# Patient Record
Sex: Female | Born: 1950
Health system: Southern US, Community
[De-identification: ages and names within clinical notes are randomized; demographics above are authoritative.]

## PROBLEM LIST (undated history)

## (undated) DIAGNOSIS — Z972 Presence of dental prosthetic device (complete) (partial): Secondary | ICD-10-CM

## (undated) DIAGNOSIS — Z9289 Personal history of other medical treatment: Secondary | ICD-10-CM

## (undated) DIAGNOSIS — R609 Edema, unspecified: Secondary | ICD-10-CM

## (undated) DIAGNOSIS — N189 Chronic kidney disease, unspecified: Secondary | ICD-10-CM

## (undated) DIAGNOSIS — Z923 Personal history of irradiation: Secondary | ICD-10-CM

## (undated) DIAGNOSIS — I10 Essential (primary) hypertension: Secondary | ICD-10-CM

## (undated) DIAGNOSIS — C3490 Malignant neoplasm of unspecified part of unspecified bronchus or lung: Secondary | ICD-10-CM

## (undated) DIAGNOSIS — K759 Inflammatory liver disease, unspecified: Secondary | ICD-10-CM

## (undated) HISTORY — DX: Personal history of irradiation: Z92.3

## (undated) HISTORY — PX: MYOMECTOMY: SHX85

## (undated) HISTORY — PX: MULTIPLE TOOTH EXTRACTIONS: SHX2053

---

## 2000-02-25 ENCOUNTER — Other Ambulatory Visit: Admission: RE | Admit: 2000-02-25 | Discharge: 2000-02-25 | Payer: Self-pay | Admitting: Family Medicine

## 2004-12-15 ENCOUNTER — Inpatient Hospital Stay (HOSPITAL_COMMUNITY): Admission: EM | Admit: 2004-12-15 | Discharge: 2004-12-17 | Payer: Self-pay | Admitting: Emergency Medicine

## 2004-12-16 ENCOUNTER — Encounter (INDEPENDENT_AMBULATORY_CARE_PROVIDER_SITE_OTHER): Payer: Self-pay | Admitting: Cardiology

## 2008-04-26 ENCOUNTER — Other Ambulatory Visit: Admission: RE | Admit: 2008-04-26 | Discharge: 2008-04-26 | Payer: Self-pay | Admitting: Family Medicine

## 2008-12-26 ENCOUNTER — Encounter: Payer: Self-pay | Admitting: Family Medicine

## 2008-12-26 ENCOUNTER — Ambulatory Visit: Payer: Self-pay | Admitting: Family Medicine

## 2008-12-26 DIAGNOSIS — I10 Essential (primary) hypertension: Secondary | ICD-10-CM | POA: Insufficient documentation

## 2008-12-26 DIAGNOSIS — F1010 Alcohol abuse, uncomplicated: Secondary | ICD-10-CM | POA: Insufficient documentation

## 2008-12-26 DIAGNOSIS — H9319 Tinnitus, unspecified ear: Secondary | ICD-10-CM | POA: Insufficient documentation

## 2008-12-26 DIAGNOSIS — F329 Major depressive disorder, single episode, unspecified: Secondary | ICD-10-CM

## 2008-12-26 DIAGNOSIS — F3289 Other specified depressive episodes: Secondary | ICD-10-CM | POA: Insufficient documentation

## 2008-12-26 DIAGNOSIS — F1210 Cannabis abuse, uncomplicated: Secondary | ICD-10-CM | POA: Insufficient documentation

## 2008-12-26 LAB — CONVERTED CEMR LAB
ALT: 17 units/L (ref 0–35)
AST: 39 units/L — ABNORMAL HIGH (ref 0–37)
Albumin: 4.2 g/dL (ref 3.5–5.2)
Alkaline Phosphatase: 63 units/L (ref 39–117)
BUN: 23 mg/dL (ref 6–23)
CO2: 21 meq/L (ref 19–32)
Calcium: 9.4 mg/dL (ref 8.4–10.5)
Chloride: 103 meq/L (ref 96–112)
Cholesterol: 254 mg/dL — ABNORMAL HIGH (ref 0–200)
Creatinine, Ser: 1.18 mg/dL (ref 0.40–1.20)
Glucose, Bld: 101 mg/dL — ABNORMAL HIGH (ref 70–99)
HDL: 58 mg/dL (ref >39)
LDL Cholesterol: 174 mg/dL — ABNORMAL HIGH (ref 0–99)
Potassium: 3.7 meq/L (ref 3.5–5.3)
Sodium: 141 meq/L (ref 135–145)
Total Bilirubin: 0.5 mg/dL (ref 0.3–1.2)
Total CHOL/HDL Ratio: 4.4
Total Protein: 7.4 g/dL (ref 6.0–8.3)
Triglycerides: 110 mg/dL (ref <150)
VLDL: 22 mg/dL (ref 0–40)

## 2009-04-25 ENCOUNTER — Encounter: Payer: Self-pay | Admitting: Family Medicine

## 2010-01-14 ENCOUNTER — Emergency Department (HOSPITAL_COMMUNITY)
Admission: EM | Admit: 2010-01-14 | Discharge: 2010-01-14 | Payer: Self-pay | Source: Home / Self Care | Admitting: Emergency Medicine

## 2010-03-06 NOTE — Miscellaneous (Signed)
  Clinical Lists Changes  Medications: Removed medication of IMIPRAMINE HCL 10 MG TABS (IMIPRAMINE HCL) one to two tabs by mouth at bedtime as needed for difficulty sleeping

## 2010-04-14 LAB — URINALYSIS, ROUTINE W REFLEX MICROSCOPIC
Bilirubin Urine: NEGATIVE
Glucose, UA: NEGATIVE mg/dL
Hgb urine dipstick: NEGATIVE
Ketones, ur: 40 mg/dL — AB
Nitrite: NEGATIVE
Protein, ur: NEGATIVE mg/dL
Specific Gravity, Urine: 1.013 (ref 1.005–1.030)
Urobilinogen, UA: 0.2 mg/dL (ref 0.0–1.0)
pH: 5 (ref 5.0–8.0)

## 2010-04-14 LAB — CBC
HCT: 33.1 % — ABNORMAL LOW (ref 36.0–46.0)
Hemoglobin: 10.7 g/dL — ABNORMAL LOW (ref 12.0–15.0)
MCH: 29.2 pg (ref 26.0–34.0)
MCHC: 32.3 g/dL (ref 30.0–36.0)
MCV: 90.4 fL (ref 78.0–100.0)
Platelets: 196 10*3/uL (ref 150–400)
RBC: 3.66 MIL/uL — ABNORMAL LOW (ref 3.87–5.11)
RDW: 13.8 % (ref 11.5–15.5)
WBC: 9.8 10*3/uL (ref 4.0–10.5)

## 2010-04-14 LAB — POCT I-STAT, CHEM 8
BUN: 34 mg/dL — ABNORMAL HIGH (ref 6–23)
Calcium, Ion: 1.09 mmol/L — ABNORMAL LOW (ref 1.12–1.32)
Chloride: 113 mEq/L — ABNORMAL HIGH (ref 96–112)
Creatinine, Ser: 1.3 mg/dL — ABNORMAL HIGH (ref 0.4–1.2)
Glucose, Bld: 82 mg/dL (ref 70–99)
HCT: 34 % — ABNORMAL LOW (ref 36.0–46.0)
Hemoglobin: 11.6 g/dL — ABNORMAL LOW (ref 12.0–15.0)
Potassium: 3.5 mEq/L (ref 3.5–5.1)
Sodium: 144 mEq/L (ref 135–145)
TCO2: 14 mmol/L (ref 0–100)

## 2010-04-14 LAB — POCT CARDIAC MARKERS
CKMB, poc: <1 ng/mL — ABNORMAL LOW (ref 1.0–8.0)
Myoglobin, poc: 75.9 ng/mL (ref 12–200)
Troponin i, poc: <0.05 ng/mL (ref 0.00–0.09)

## 2010-04-14 LAB — D-DIMER, QUANTITATIVE: D-Dimer, Quant: 1.08 ug/mL-FEU — ABNORMAL HIGH (ref 0.00–0.48)

## 2010-04-14 LAB — RAPID URINE DRUG SCREEN, HOSP PERFORMED
Amphetamines: NOT DETECTED
Barbiturates: NOT DETECTED
Benzodiazepines: NOT DETECTED
Cocaine: NOT DETECTED
Opiates: NOT DETECTED
Tetrahydrocannabinol: NOT DETECTED

## 2010-04-14 LAB — ETHANOL: Alcohol, Ethyl (B): <5 mg/dL (ref 0–10)

## 2010-06-20 NOTE — Consult Note (Signed)
Kimberly Frost, Kimberly Frost               ACCOUNT NO.:  [id number]   MEDICAL RECORD NO.:  [id number]          PATIENT TYPE:  INP   LOCATION:  2016                         FACILITY:  [facility]   PHYSICIAN:  Fransico Him, M.D.     DATE OF BIRTH:  Apr 04, 1950   DATE OF CONSULTATION:  12/15/2004  DATE OF DISCHARGE:                                   CONSULTATION   REASON FOR CONSULTATION:  Chest pain.   HISTORY OF PRESENT ILLNESS:  This is a very pleasant 60 year old black  female with a history of hypertension. She was admitted to the emergency  room after a prolonged episode of chest pain, lasting more than 3 hours.  Apparently she awakened from sleep last night with mid sternal chest pain  and pressure, radiating to the right shoulder. She felt weak, nauseated, and  dizzy but did not vomit. She did not have any diaphoresis. She said that she  also felt like she could not get her breath. She lives in [place] and  was very frustrated. Apparently, her fiancee drove her to [facility] and at that time, the pain was a 7 out of 10 upon arrival. She did  receive sublingual nitroglycerin and Tylenol with eventual relief of pain  but not immediately. Initial blood pressure on examination was 171.96 mmHg.  Apparently, the patient ran out of her Toprol about 2 weeks ago. She denied  any tachypalpitations. She denies any exertional symptoms. She did have 1  episode similar to this a month ago, again occurring while she was lying in  bed. She thought that it was an anxiety attack. Again, she has never had any  exertional symptoms of shortness of breath or chest pain.   FAMILY HISTORY:  Mother has hypertension. She has a family history of adult  onset diabetes mellitus. There is no family history of coronary disease.   SOCIAL HISTORY:  She is a former user of tobacco but quit 20 years ago. She  drinks 2 to 3 beers and gin every other day but not daily. She works at [employer]  in [employer]. She lifts  heavy boxes.   PAST MEDICAL HISTORY:  Includes hypertension, anxiety, and panic attacks.  Medical non-compliance due to financial concerns and borderline alcohol  abuse.   ALLERGIES:  NONE.   MEDICATIONS:  The patient is on Toprol XL but has not been taking it. She  ran out.   REVIEW OF SYSTEMS:  Other than as stated in the HPI, is negative.   PHYSICAL EXAMINATION:  VITAL SIGNS:  Blood pressure 141/83, pulse 81,  respiratory rate 20. She is afebrile.  GENERAL:  A well developed, well nourished, black female in no acute  distress.  HEENT:  Benign.  NECK:  Supple without lymphadenopathy. Carotid upstrokes +2 bilaterally. No  bruits.  LUNGS:  Clear to auscultation throughout.  HEART:  Regular rate and rhythm. No murmur, rub, or gallop. Normal S1 and  S2.  ABDOMEN:  Soft, nontender, and nondistended. Normal active bowel sounds. No  hepatosplenomegaly.  EXTREMITIES:  No edema.   LABORATORY DATA:  Sodium  139, potassium 3, chloride 104, bicarb 27, BUN 12,  creatinine 0.9, glucose 95. Myoglobin was 65.5. CPK MB 1.1, troponin less  than 0.05.   Baseline EKG shows sinus rhythm with non-specific T wave abnormality.   Chest x-ray is negative. No active disease.   IMPRESSION:  1.  Prolonged episode of chest pain. Very atypical in that it only occurs at      rest. She has had 2 episode in the past month, both of which occurred      while she was in bed at night. She has had no exertional symptoms and      symptoms are most consistent with gastroesophageal reflux, although she      does have cardiac risk factors including her age and hypertension as      well as a remote history of tobacco use. First set of cardiac markers      are negative and EKG is essentially non-ischemic with non-specific T      wave abnormality.  2.  Hypokalemia.  3.  Hypertension, poorly controlled.  4.  Medical non-compliance.   PLAN:  1.  Agree with continuing to rule out myocardial infarction with serial       enzymes. Again, initial enzymes are negative but would continue to rule      out with serial enzymes.  2.  If cardiac enzymes are negative x3, would perform stress Cardiolite      study in the morning.  3.  Agree with Lovenox subcutaneously.  4.  Agree with spiral CT to rule out pulmonary embolism.  5.  Check fasting lipid panel.  6.  Agree with resuming beta blockers for blood pressure control.  7.  Replete potassium and recheck in the morning.  8.  Would recommend starting PPI, given history of alcohol use and symptoms      more consistent with gastroesophageal reflux.  9.  Check a 2-D echocardiogram to rule out pericardial effusion.      Fransico Him, M.D.  Electronically Signed     TT/MEDQ  D:  12/15/2004  T:  12/16/2004  Job:  [id number]   cc:   L. Donnie Coffin, M.D.  Fax: [phone]

## 2010-06-20 NOTE — H&P (Signed)
NAMECALVINA, WEDDINGTON NO.:  [id number]   MEDICAL RECORD NO.:  [id number]          PATIENT TYPE:  EMS   LOCATION:  [facility]JO                         FACILITY:  [facility]   PHYSICIAN:  Jerelene Redden, MD      DATE OF BIRTH:  1950/07/09   DATE OF ADMISSION:  12/15/2004  DATE OF DISCHARGE:                                HISTORY & PHYSICAL   Kimberly Frost is a 60 year old lady who states that she is a patient of  Dr. Donnie Frost. She indicates that she has been treated in the past for  hypertension and was taking Toprol-XL 100 mg daily until about 2 weeks ago,  when she stopped the medicine because she said that she had run out. At  about 2 a.m. this morning, she awakened from sleep with a feeling of  smothering and in her chest associated with a pressure/pain. It did not seem  to radiate to the arm or neck. The pain seemed to be made worse by  breathing. It did not seem to radiate through to the back. She also noted  that her heart seemed to be beating both hard and fast. She had no  diaphoresis or nausea. She got up and tried to walk around but this did not  seem to relieve the pain. Eventually, she was transported to the [facility]  emergency room where on arrival her blood pressure was 171/96, O2 saturation  was 99%. An electrocardiogram was obtained which showed nonspecific ST and T  wave changes. A chest x-ray was within normal limits. Point of care markers  have been negative. She received 2 sublingual nitroglycerin which gave her a  headache and which she feels caused the pain to gradually resolve, although  there was no dramatic response. Kimberly Frost indicates that she has had  similar episodes, most recently about a month ago, although these episodes  were not as severe. Apparently, she has not been taking her Toprol-XL  consistently because she says that when she has one of these episodes, she  will usually take a Toprol-XL tablet and that the problem will seem to  get  better. This time, she did not have any Toprol-XL to take. She does not  recall any history of exertional chest pain. There is no past history of a  heart problem that she is aware of, and she has not had a stress test in the  past. Also of possible significance is that Kimberly Frost states that she has  been drinking an excessive amount of alcohol recently. Some days she will  drink three or four beers, but often she will drink a half a pin of gin each  night. She does not have any history of shakes, blackout episodes, or  delirium tremens.   PAST MEDICAL HISTORY:  Medications:  None. Allergies:  None. Medical  illnesses:  Hypertension, see above. Operations:  She had a uterine  myomectomy in 1994.   FAMILY HISTORY:  Notable for diabetes and hypertension in her mother. Her  father had a history of bone cancer. She has a brother but  does not really  know anything about his health history.   SOCIAL HISTORY:  She works for [employer] at [employer]. She says that her  work is stressful. She has not smoked for about 20 years. Her alcohol  history is described above. She does not abuse drugs or take any other  nonprescription medications.   REVIEW OF SYSTEMS:  HEAD:  She denies headache or dizziness. EYES:  She  denies visual blurring or diplopia. EAR, NOSE, AND THROAT:  Denies earache,  sinus pain, or sore throat. CHEST:  Denies coughing or wheezing; otherwise,  see above. CARDIOVASCULAR:  See above. GI:  She denies any history of  indigestion, heart burn, hematemesis, nausea, or vomiting. GENITOURINARY:  Denies dysuria or urinary frequency. NEUROLOGIC:  No history of seizure or  stroke. ENDO:  Denies excessive thirst, urinary frequency, or nocturia.   PHYSICAL EXAMINATION:  VITAL SIGNS:  Blood pressure presently is 138/84,  heart rate is 90, respirations 20, O2 saturation 100%.  HEENT:  Within normal limits.  CHEST:  Clear.  CARDIOVASCULAR:  Reveals a mild tachycardia. There are no  rubs, murmurs, or  gallops. The heart rhythm is regular.  ABDOMEN:  Benign, normal bowel sounds. No masses or tenderness, no guarding  or rebound.  NEUROLOGIC AND EXTREMITIES:  Normal.   Laboratory studies obtained include sodium of 141, potassium 3.0, glucose  96, creatinine 0.8. initial point of care markers negative. Chest x-ray  normal.   IMPRESSION:  1.  Chest pain, rule out myocardial infarction.  2.  Associated tachycardia and dyspnea; consider pulmonary embolus,      gastroesophageal reflux, panic attack.  3.  History of hypertension, noncompliant.  4.  Probable alcohol abuse.  5.  Hypokalemia.   The plan will be to admit the patient to telemetry to rule out MI. Will  obtain a spiral CT scan of the chest to rule out PE. Will resume a beta  blocker as well as subcu Lovenox and follow standard protocol. I am going to  give the patient thiamine as well as Ativan as a precaution against alcohol  withdrawal.           ______________________________  Jerelene Redden, MD     SY/MEDQ  D:  12/15/2004  T:  12/15/2004  Job:  [id number]   cc:   Kimberly Frost, M.D.  Fax: [phone]

## 2011-01-29 ENCOUNTER — Ambulatory Visit: Payer: Self-pay | Admitting: Family Medicine

## 2011-02-04 ENCOUNTER — Ambulatory Visit: Payer: Self-pay | Admitting: Family Medicine

## 2011-10-26 ENCOUNTER — Ambulatory Visit (INDEPENDENT_AMBULATORY_CARE_PROVIDER_SITE_OTHER): Payer: Self-pay | Admitting: Family Medicine

## 2011-10-26 ENCOUNTER — Encounter: Payer: Self-pay | Admitting: Family Medicine

## 2011-10-26 VITALS — BP 168/97 | HR 90 | Temp 99.0°F | Ht 67.0 in | Wt 118.0 lb

## 2011-10-26 DIAGNOSIS — I10 Essential (primary) hypertension: Secondary | ICD-10-CM

## 2011-10-26 MED ORDER — HYDROCHLOROTHIAZIDE 25 MG PO TABS: 25.0000 mg | ORAL_TABLET | Freq: Every day | ORAL | Status: DC

## 2011-10-26 NOTE — Patient Instructions (Addendum)
It was great to meet you today, Kimberly Frost. I sent HCTZ to your pharmacy.  Take once a day. Try to avoid high sodium foods and increase physical activity. Apply for [facility] card, then schedule follow up appointment with me. If you develop worsening symptoms, chest pain, shortness of breath, or one sided weakness, please call MD or go to ER.  Hypertension As your heart beats, it forces blood through your arteries. This force is your blood pressure. If the pressure is too high, it is called hypertension (HTN) or high blood pressure. HTN is dangerous because you may have it and not know it. High blood pressure may mean that your heart has to work harder to pump blood. Your arteries may be narrow or stiff. The extra work puts you at risk for heart disease, stroke, and other problems.  Blood pressure consists of two numbers, a higher number over a lower, 110/72, for example. It is stated as "110 over 72." The ideal is below 120 for the top number (systolic) and under 80 for the bottom (diastolic). Write down your blood pressure today. You should pay close attention to your blood pressure if you have certain conditions such as:  Heart failure.   Prior heart attack.   Diabetes   Chronic kidney disease.   Prior stroke.   Multiple risk factors for heart disease.  To see if you have HTN, your blood pressure should be measured while you are seated with your arm held at the level of the heart. It should be measured at least twice. A one-time elevated blood pressure reading (especially in the Emergency Department) does not mean that you need treatment. There may be conditions in which the blood pressure is different between your right and left arms. It is important to see your caregiver soon for a recheck. Most people have essential hypertension which means that there is not a specific cause. This type of high blood pressure may be lowered by changing lifestyle factors such as:  Stress.   Smoking.    Lack of exercise.   Excessive weight.   Drug/tobacco/alcohol use.   Eating less salt.  Most people do not have symptoms from high blood pressure until it has caused damage to the body. Effective treatment can often prevent, delay or reduce that damage. TREATMENT  When a cause has been identified, treatment for high blood pressure is directed at the cause. There are a large number of medications to treat HTN. These fall into several categories, and your caregiver will help you select the medicines that are best for you. Medications may have side effects. You should review side effects with your caregiver. If your blood pressure stays high after you have made lifestyle changes or started on medicines,   Your medication(s) may need to be changed.   Other problems may need to be addressed.   Be certain you understand your prescriptions, and know how and when to take your medicine.   Be sure to follow up with your caregiver within the time frame advised (usually within two weeks) to have your blood pressure rechecked and to review your medications.   If you are taking more than one medicine to lower your blood pressure, make sure you know how and at what times they should be taken. Taking two medicines at the same time can result in blood pressure that is too low.  SEEK IMMEDIATE MEDICAL CARE IF:  You develop a severe headache, blurred or changing vision, or confusion.  You have unusual weakness or numbness, or a faint feeling.   You have severe chest or abdominal pain, vomiting, or breathing problems.  MAKE SURE YOU:   Understand these instructions.   Will watch your condition.   Will get help right away if you are not doing well or get worse.  Document Released: 01/19/2005 Document Revised: 01/08/2011 Document Reviewed: 09/09/2007 [facility] Patient Information 2012 [place].  DASH Diet The DASH diet stands for "Dietary Approaches to Stop Hypertension." It is a healthy  eating plan that has been shown to reduce high blood pressure (hypertension) in as little as 14 days, while also possibly providing other significant health benefits. These other health benefits include reducing the risk of breast cancer after menopause and reducing the risk of type 2 diabetes, heart disease, colon cancer, and stroke. Health benefits also include weight loss and slowing kidney failure in patients with chronic kidney disease.  DIET GUIDELINES  Limit salt (sodium). Your diet should contain less than 1500 mg of sodium daily.   Limit refined or processed carbohydrates. Your diet should include mostly whole grains. Desserts and added sugars should be used sparingly.   Include small amounts of heart-healthy fats. These types of fats include nuts, oils, and tub margarine. Limit saturated and trans fats. These fats have been shown to be harmful in the body.  CHOOSING FOODS  The following food groups are based on a 2000 calorie diet. See your Registered Dietitian for individual calorie needs. Grains and Grain Products (6 to 8 servings daily)  Eat More Often: Whole-wheat bread, brown rice, whole-grain or wheat pasta, quinoa, popcorn without added fat or salt (air popped).   Eat Less Often: White bread, white pasta, white rice, cornbread.  Vegetables (4 to 5 servings daily)  Eat More Often: Fresh, frozen, and canned vegetables. Vegetables may be raw, steamed, roasted, or grilled with a minimal amount of fat.   Eat Less Often/Avoid: Creamed or fried vegetables. Vegetables in a cheese sauce.  Fruit (4 to 5 servings daily)  Eat More Often: All fresh, canned (in natural juice), or frozen fruits. Dried fruits without added sugar. One hundred percent fruit juice ( cup [237 mL] daily).   Eat Less Often: Dried fruits with added sugar. Canned fruit in light or heavy syrup.  [employer], Fish, and Poultry (2 servings or less daily. One serving is 3 to 4 oz [85-114 g]).  Eat More Often: Ninety  percent or leaner ground beef, tenderloin, sirloin. Round cuts of beef, chicken breast, [place] breast. All fish. Grill, bake, or broil your meat. Nothing should be fried.   Eat Less Often/Avoid: Fatty cuts of meat, [place], or chicken leg, thigh, or wing. Fried cuts of meat or fish.  Dairy (2 to 3 servings)  Eat More Often: Low-fat or fat-free milk, low-fat plain or light yogurt, reduced-fat or part-skim cheese.   Eat Less Often/Avoid: Milk (whole, 2%, skim, or chocolate).Whole milk yogurt. Full-fat cheeses.  Nuts, Seeds, and Legumes (4 to 5 servings per week)  Eat More Often: All without added salt.   Eat Less Often/Avoid: Salted nuts and seeds, canned beans with added salt.  Fats and Sweets (limited)  Eat More Often: Vegetable oils, tub margarines without trans fats, sugar-free gelatin. Mayonnaise and salad dressings.   Eat Less Often/Avoid: Coconut oils, palm oils, butter, stick margarine, cream, half and half, cookies, candy, pie.  FOR MORE INFORMATION The Dash Diet Eating Plan: www.dashdiet.org Document Released: 01/08/2011 Document Reviewed: 12/29/2010 [facility] Patient Information 2012 [place]  [place].  

## 2011-10-26 NOTE — Progress Notes (Signed)
  Subjective:    Patient here for evaluation of elevated blood pressure.  Patient used to come to our clinic in 2011, but stopped when she lost insurance.  She went to ER for chest pain and she was found to have elevated BP.  She was started on Metoprolol 50 BID.  Patient took this for one year and felt better.  Patient ran out of it and has been off of medications for about 9 months.   She is not exercising and is not adherent to a low-salt diet.  Endorses symptoms: dyspnea and parasthesias in upper extremities.  Patient also complains of intermittent headaches, blurry vision, but denies any unilateral weakness.  Patient denies: chest pain, fatigue, near-syncope and syncope. Cardiovascular risk factors: was told she had pre-diabetes, but has not been screened in over 2 years.    Review of Systems Pertinent items are noted in HPI.     Objective:    BP 168/97  Pulse 90  Temp 99 F (37.2 C) (Oral)  Ht 5\' 7"  (1.702 m)  Wt 118 lb (53.524 kg)  BMI 18.48 kg/m2 General appearance: alert, cooperative and no distress Eyes: conjunctivae/corneas clear. PERRL, EOM's intact. Fundi benign. Neck: no adenopathy, no JVD and supple, symmetrical, trachea midline Lungs: clear to auscultation bilaterally Heart: regular rate and rhythm, S1, S2 normal, no murmur, click, rub or gallop Extremities: extremities normal, atraumatic, no cyanosis or edema    Assessment:    Hypertension, Uncontrolled.    Plan:    See Problem List

## 2011-10-26 NOTE — Assessment & Plan Note (Signed)
Will restart HCTZ 25 mg daily.  Will likely need to add combination therapy. Red flags discussed.  See AVS.   Counseled on DASH diet. Patient to apply for [facility] card and then schedule follow up appointment with me. Return to clinic in 1 month or sooner as needed.

## 2012-12-22 ENCOUNTER — Emergency Department (HOSPITAL_COMMUNITY)
Admission: EM | Admit: 2012-12-22 | Discharge: 2012-12-22 | Disposition: A | Discharge status: Home / Self Care | Payer: Self-pay | Attending: Emergency Medicine | Admitting: Emergency Medicine

## 2012-12-22 ENCOUNTER — Emergency Department (HOSPITAL_COMMUNITY): Payer: Self-pay

## 2012-12-22 ENCOUNTER — Encounter (HOSPITAL_COMMUNITY): Payer: Self-pay | Admitting: Emergency Medicine

## 2012-12-22 DIAGNOSIS — I10 Essential (primary) hypertension: Secondary | ICD-10-CM | POA: Insufficient documentation

## 2012-12-22 DIAGNOSIS — R42 Dizziness and giddiness: Secondary | ICD-10-CM | POA: Insufficient documentation

## 2012-12-22 DIAGNOSIS — Z87891 Personal history of nicotine dependence: Secondary | ICD-10-CM | POA: Insufficient documentation

## 2012-12-22 DIAGNOSIS — Z79899 Other long term (current) drug therapy: Secondary | ICD-10-CM | POA: Insufficient documentation

## 2012-12-22 DIAGNOSIS — R002 Palpitations: Secondary | ICD-10-CM | POA: Insufficient documentation

## 2012-12-22 DIAGNOSIS — R000 Tachycardia, unspecified: Secondary | ICD-10-CM | POA: Insufficient documentation

## 2012-12-22 DIAGNOSIS — R0602 Shortness of breath: Secondary | ICD-10-CM | POA: Insufficient documentation

## 2012-12-22 DIAGNOSIS — Z8719 Personal history of other diseases of the digestive system: Secondary | ICD-10-CM | POA: Insufficient documentation

## 2012-12-22 DIAGNOSIS — E876 Hypokalemia: Secondary | ICD-10-CM | POA: Insufficient documentation

## 2012-12-22 HISTORY — DX: Essential (primary) hypertension: I10

## 2012-12-22 HISTORY — DX: Inflammatory liver disease, unspecified: K75.9

## 2012-12-22 LAB — BASIC METABOLIC PANEL
CO2: 18 mEq/L — ABNORMAL LOW (ref 19–32)
Calcium: 9.3 mg/dL (ref 8.4–10.5)
Chloride: 105 mEq/L (ref 96–112)
Creatinine, Ser: 1.75 mg/dL — ABNORMAL HIGH (ref 0.50–1.10)
GFR calc Af Amer: 35 mL/min — ABNORMAL LOW (ref >90)
Glucose, Bld: 91 mg/dL (ref 70–99)
Potassium: 3.2 mEq/L — ABNORMAL LOW (ref 3.5–5.1)
Sodium: 139 mEq/L (ref 135–145)

## 2012-12-22 LAB — CBC WITH DIFFERENTIAL/PLATELET
Basophils Absolute: 0 10*3/uL (ref 0.0–0.1)
Basophils Relative: 0 % (ref 0–1)
Eosinophils Absolute: 0 10*3/uL (ref 0.0–0.7)
HCT: 34.4 % — ABNORMAL LOW (ref 36.0–46.0)
Hemoglobin: 12 g/dL (ref 12.0–15.0)
Lymphocytes Relative: 30 % (ref 12–46)
MCH: 31 pg (ref 26.0–34.0)
Monocytes Absolute: 0.6 10*3/uL (ref 0.1–1.0)
Monocytes Relative: 11 % (ref 3–12)
Neutro Abs: 3.1 10*3/uL (ref 1.7–7.7)
Neutrophils Relative %: 58 % (ref 43–77)
RBC: 3.87 MIL/uL (ref 3.87–5.11)
RDW: 13.2 % (ref 11.5–15.5)
WBC: 5.3 10*3/uL (ref 4.0–10.5)

## 2012-12-22 LAB — CBC
Hemoglobin: 12.3 g/dL (ref 12.0–15.0)
MCH: 30.4 pg (ref 26.0–34.0)
MCHC: 34 g/dL (ref 30.0–36.0)
MCV: 89.6 fL (ref 78.0–100.0)
Platelets: 211 10*3/uL (ref 150–400)
RBC: 4.04 MIL/uL (ref 3.87–5.11)
WBC: 5.1 10*3/uL (ref 4.0–10.5)

## 2012-12-22 LAB — POCT I-STAT TROPONIN I

## 2012-12-22 LAB — PRO B NATRIURETIC PEPTIDE: Pro B Natriuretic peptide (BNP): 340.3 pg/mL — ABNORMAL HIGH (ref 0–125)

## 2012-12-22 MED ORDER — HYDROCHLOROTHIAZIDE 25 MG PO TABS: 25.0000 mg | ORAL_TABLET | Freq: Every day | ORAL | Status: DC

## 2012-12-22 MED ORDER — POTASSIUM CHLORIDE CRYS ER 20 MEQ PO TBCR
40.0000 meq | EXTENDED_RELEASE_TABLET | Freq: Once | ORAL | Status: AC
  Administered 2012-12-22: 40 meq via ORAL
  Filled 2012-12-22: qty 2

## 2012-12-22 MED ORDER — HYDROCHLOROTHIAZIDE 25 MG PO TABS
25.0000 mg | ORAL_TABLET | Freq: Every day | ORAL | Status: DC
  Administered 2012-12-22: 25 mg via ORAL
  Filled 2012-12-22: qty 1

## 2012-12-22 MED ORDER — METOPROLOL SUCCINATE ER 50 MG PO TB24: 100.0000 mg | ORAL_TABLET | Freq: Every day | ORAL | Status: DC

## 2012-12-22 MED ORDER — METOPROLOL SUCCINATE ER 50 MG PO TB24
50.0000 mg | ORAL_TABLET | Freq: Every day | ORAL | Status: DC
  Administered 2012-12-22: 50 mg via ORAL
  Filled 2012-12-22: qty 1

## 2012-12-22 NOTE — ED Provider Notes (Signed)
Medical screening examination/treatment/procedure(s) were performed by non-physician practitioner and as supervising physician I was immediately available for consultation/collaboration.  EKG Interpretation    Date/Time:  Thursday December 22 2012 11:24:54 EST Ventricular Rate:  102 PR Interval:  138 QRS Duration: 74 QT Interval:  320 QTC Calculation: 417 R Axis:   13 Text Interpretation:  Sinus tachycardia Right atrial enlargement Nonspecific ST and T wave abnormality Abnormal ECG No significant change since last tracing Confirmed by Sakura Denis  MD, Chun Sellen ([id number]) on 12/22/2012 11:31:18 AM              Ephraim Hamburger, MD 12/22/12 1515

## 2012-12-22 NOTE — ED Provider Notes (Signed)
CSN: [id number]     Arrival date & time 12/22/12  1119 History   First MD Initiated Contact with Patient 12/22/12 1128     Chief Complaint  Patient presents with  . Palpitations  . Shortness of Breath  . Dizziness   (Consider location/radiation/quality/duration/timing/severity/associated sxs/prior Treatment) HPI Comments: Patient is a 62 year old female with a past medical history of hypertension and hepatitis who presents with a 4 day history of palpitations, SOB, and dizziness that she describes as a lightheadedness. Symptoms started gradually when she was at work and have remained constant since the onset. Patient reports being out of her HCTZ and Metoprolol medication for the past 2 months. She tried to see her doctor but they were not able to see her. Patient thinks her symptoms are due to her blood pressure. No aggravating/alleviating factors. No other associated symptoms such as chest pain.    Past Medical History  Diagnosis Date  . Hypertension   . Hepatitis    History reviewed. No pertinent past surgical history. History reviewed. No pertinent family history. History  Substance Use Topics  . Smoking status: Former Research scientist (life sciences)  . Smokeless tobacco: Not on file  . Alcohol Use: Yes   OB History   Grav Para Term Preterm Abortions TAB SAB Ect Mult Living                 Review of Systems  Respiratory: Positive for shortness of breath.   Cardiovascular: Positive for palpitations.  Neurological: Positive for light-headedness.  All other systems reviewed and are negative.    Allergies  Review of patient's allergies indicates no known allergies.  Home Medications   Current Outpatient Rx  Name  Route  Sig  Dispense  Refill  . hydrochlorothiazide (HYDRODIURIL) 25 MG tablet   Oral   Take 1 tablet (25 mg total) by mouth daily.   30 tablet   1   . metoprolol (TOPROL-XL) 100 MG 24 hr tablet   Oral   Take 100 mg by mouth daily.            BP 189/108  Pulse 106   Temp(Src) 99.1 F (37.3 C) (Oral)  Resp 18  SpO2 100% Physical Exam  Nursing note and vitals reviewed. Constitutional: She is oriented to person, place, and time. She appears well-developed and well-nourished. No distress.  HENT:  Head: Normocephalic and atraumatic.  Eyes: Conjunctivae and EOM are normal.  Neck: Normal range of motion.  Cardiovascular: Regular rhythm.  Exam reveals no gallop and no friction rub.   No murmur heard. tachycardic  Pulmonary/Chest: Effort normal and breath sounds normal. She has no wheezes. She has no rales. She exhibits no tenderness.  Abdominal: Soft. She exhibits no distension. There is no tenderness. There is no rebound and no guarding.  Musculoskeletal: Normal range of motion.  Neurological: She is alert and oriented to person, place, and time. Coordination normal.  Speech is goal-oriented. Moves limbs without ataxia.   Skin: Skin is warm and dry.  Psychiatric: She has a normal mood and affect. Her behavior is normal.    ED Course  Procedures (including critical care time) Labs Review Labs Reviewed  BASIC METABOLIC PANEL - Abnormal; Notable for the following:    Potassium 3.2 (*)    CO2 18 (*)    BUN 27 (*)    Creatinine, Ser 1.75 (*)    GFR calc non Af Amer 30 (*)    GFR calc Af Amer 35 (*)  All other components within normal limits  PRO B NATRIURETIC PEPTIDE - Abnormal; Notable for the following:    Pro B Natriuretic peptide (BNP) 340.3 (*)    All other components within normal limits  CBC WITH DIFFERENTIAL - Abnormal; Notable for the following:    HCT 34.4 (*)    All other components within normal limits  CBC  POCT I-STAT TROPONIN I   Imaging Review Dg Chest 2 View  12/22/2012   CLINICAL DATA:  Palpitations, dizzy, shortness of breath  EXAM: CHEST  2 VIEW  COMPARISON:  None.  FINDINGS: The heart size and vascular pattern are normal. There is no infiltrate or effusion. A 3 mm calcified nodular opacity projects over the right upper  lobe laterally, not of acute clinical significance.  IMPRESSION: No active cardiopulmonary disease.   Electronically Signed   By: Skipper Cliche M.D.   On: 12/22/2012 12:36    EKG Interpretation    Date/Time:  Thursday December 22 2012 11:24:54 EST Ventricular Rate:  102 PR Interval:  138 QRS Duration: 74 QT Interval:  320 QTC Calculation: 417 R Axis:   13 Text Interpretation:  Sinus tachycardia Right atrial enlargement Nonspecific ST and T wave abnormality Abnormal ECG No significant change since last tracing Confirmed by GOLDSTON  MD, SCOTT ([id number]) on 12/22/2012 11:31:18 AM            MDM   1. Palpitations   2. Lightheaded   3. Hypokalemia     11:38 AM Labs and chest xray pending. EKG shows no significant changes from previous. Patient tachycardic and hypertensive at this time.   2:50 PM Labs show mild hypokalemia and no other acute changes. Troponin is negative and BNP only slightly elevated and not consistent with CHF based on chest xray. Patient was given her home meds here. I will discharged her with a refill of her home medications. Patient reports improvement of her symptoms. Patient discussed with Dr. Regenia Skeeter who is agreeable to plan.    Alvina Chou, PA-C 12/22/12 1502

## 2012-12-22 NOTE — ED Notes (Addendum)
Pt reports feeling lightheaded and dizziness when she went to work on Monday, having sob and palpitations. HR 110 at triage. ekg done. Airway intact. Reports being out of bp meds.

## 2013-01-26 ENCOUNTER — Emergency Department (HOSPITAL_COMMUNITY): Payer: Self-pay

## 2013-01-26 ENCOUNTER — Observation Stay (HOSPITAL_COMMUNITY)
Admission: EM | Admit: 2013-01-26 | Discharge: 2013-01-27 | Disposition: A | Discharge status: Home / Self Care | Payer: Self-pay | Attending: Family Medicine | Admitting: Family Medicine

## 2013-01-26 ENCOUNTER — Encounter (HOSPITAL_COMMUNITY): Payer: Self-pay | Admitting: Emergency Medicine

## 2013-01-26 ENCOUNTER — Inpatient Hospital Stay (HOSPITAL_COMMUNITY): Payer: Self-pay

## 2013-01-26 DIAGNOSIS — E860 Dehydration: Secondary | ICD-10-CM

## 2013-01-26 DIAGNOSIS — R000 Tachycardia, unspecified: Secondary | ICD-10-CM | POA: Insufficient documentation

## 2013-01-26 DIAGNOSIS — R791 Abnormal coagulation profile: Secondary | ICD-10-CM

## 2013-01-26 DIAGNOSIS — F1010 Alcohol abuse, uncomplicated: Secondary | ICD-10-CM | POA: Diagnosis present

## 2013-01-26 DIAGNOSIS — E872 Acidosis, unspecified: Principal | ICD-10-CM | POA: Insufficient documentation

## 2013-01-26 DIAGNOSIS — R112 Nausea with vomiting, unspecified: Secondary | ICD-10-CM | POA: Insufficient documentation

## 2013-01-26 DIAGNOSIS — R197 Diarrhea, unspecified: Secondary | ICD-10-CM | POA: Insufficient documentation

## 2013-01-26 DIAGNOSIS — Z87891 Personal history of nicotine dependence: Secondary | ICD-10-CM | POA: Insufficient documentation

## 2013-01-26 DIAGNOSIS — M7989 Other specified soft tissue disorders: Secondary | ICD-10-CM

## 2013-01-26 DIAGNOSIS — Z23 Encounter for immunization: Secondary | ICD-10-CM | POA: Insufficient documentation

## 2013-01-26 DIAGNOSIS — I10 Essential (primary) hypertension: Secondary | ICD-10-CM | POA: Insufficient documentation

## 2013-01-26 DIAGNOSIS — F1210 Cannabis abuse, uncomplicated: Secondary | ICD-10-CM | POA: Diagnosis present

## 2013-01-26 DIAGNOSIS — E876 Hypokalemia: Secondary | ICD-10-CM | POA: Insufficient documentation

## 2013-01-26 DIAGNOSIS — R002 Palpitations: Secondary | ICD-10-CM

## 2013-01-26 DIAGNOSIS — N179 Acute kidney failure, unspecified: Secondary | ICD-10-CM | POA: Insufficient documentation

## 2013-01-26 DIAGNOSIS — F329 Major depressive disorder, single episode, unspecified: Secondary | ICD-10-CM | POA: Insufficient documentation

## 2013-01-26 DIAGNOSIS — F3289 Other specified depressive episodes: Secondary | ICD-10-CM | POA: Insufficient documentation

## 2013-01-26 DIAGNOSIS — R7989 Other specified abnormal findings of blood chemistry: Secondary | ICD-10-CM

## 2013-01-26 LAB — CBC WITH DIFFERENTIAL/PLATELET
Basophils Relative: 0 % (ref 0–1)
HCT: 34.3 % — ABNORMAL LOW (ref 36.0–46.0)
Hemoglobin: 11.6 g/dL — ABNORMAL LOW (ref 12.0–15.0)
Lymphs Abs: 3 10*3/uL (ref 0.7–4.0)
MCH: 29.7 pg (ref 26.0–34.0)
MCHC: 33.8 g/dL (ref 30.0–36.0)
Monocytes Absolute: 0.7 10*3/uL (ref 0.1–1.0)
Monocytes Relative: 6 % (ref 3–12)
Neutro Abs: 8.4 10*3/uL — ABNORMAL HIGH (ref 1.7–7.7)
RBC: 3.91 MIL/uL (ref 3.87–5.11)
WBC: 12.2 10*3/uL — ABNORMAL HIGH (ref 4.0–10.5)

## 2013-01-26 LAB — BASIC METABOLIC PANEL
BUN: 47 mg/dL — ABNORMAL HIGH (ref 6–23)
BUN: 46 mg/dL — ABNORMAL HIGH (ref 6–23)
CO2: 17 mEq/L — ABNORMAL LOW (ref 19–32)
Calcium: 9.5 mg/dL (ref 8.4–10.5)
Calcium: 8.7 mg/dL (ref 8.4–10.5)
Chloride: 101 mEq/L (ref 96–112)
Chloride: 107 mEq/L (ref 96–112)
Creatinine, Ser: 1.92 mg/dL — ABNORMAL HIGH (ref 0.50–1.10)
Creatinine, Ser: 1.74 mg/dL — ABNORMAL HIGH (ref 0.50–1.10)
GFR calc Af Amer: 31 mL/min — ABNORMAL LOW (ref >90)
GFR calc Af Amer: 35 mL/min — ABNORMAL LOW (ref >90)
GFR calc Af Amer: 35 mL/min — ABNORMAL LOW (ref >90)
GFR calc non Af Amer: 30 mL/min — ABNORMAL LOW (ref >90)
Glucose, Bld: 93 mg/dL (ref 70–99)
Glucose, Bld: 95 mg/dL (ref 70–99)
Potassium: 4.5 mEq/L (ref 3.5–5.1)
Potassium: 4 mEq/L (ref 3.5–5.1)
Sodium: 138 mEq/L (ref 135–145)

## 2013-01-26 LAB — SALICYLATE LEVEL: Salicylate Lvl: <2 mg/dL — ABNORMAL LOW (ref 2.8–20.0)

## 2013-01-26 LAB — POCT I-STAT 3, ART BLOOD GAS (G3+)
Acid-base deficit: 13 mmol/L — ABNORMAL HIGH (ref 0.0–2.0)
Bicarbonate: 12.9 mEq/L — ABNORMAL LOW (ref 20.0–24.0)
O2 Saturation: 94 %
TCO2: 14 mmol/L (ref 0–100)

## 2013-01-26 LAB — HEPATIC FUNCTION PANEL
ALT: 37 U/L — ABNORMAL HIGH (ref 0–35)
Alkaline Phosphatase: 60 U/L (ref 39–117)
Bilirubin, Direct: 0.1 mg/dL (ref 0.0–0.3)
Indirect Bilirubin: 0.3 mg/dL (ref 0.3–0.9)

## 2013-01-26 LAB — RAPID URINE DRUG SCREEN, HOSP PERFORMED
Amphetamines: NOT DETECTED
Barbiturates: NOT DETECTED
Cocaine: NOT DETECTED
Tetrahydrocannabinol: NOT DETECTED

## 2013-01-26 LAB — BLOOD GAS, ARTERIAL
Bicarbonate: 15.7 mEq/L — ABNORMAL LOW (ref 20.0–24.0)
Drawn by: 31101
FIO2: 0.21 %
pCO2 arterial: 29.1 mmHg — ABNORMAL LOW (ref 35.0–45.0)
pH, Arterial: 7.352 (ref 7.350–7.450)
pO2, Arterial: 87.6 mmHg (ref 80.0–100.0)

## 2013-01-26 LAB — POCT I-STAT TROPONIN I

## 2013-01-26 LAB — ACETAMINOPHEN LEVEL: Acetaminophen (Tylenol), Serum: <15 ug/mL (ref 10–30)

## 2013-01-26 LAB — CBC
Hemoglobin: 10.6 g/dL — ABNORMAL LOW (ref 12.0–15.0)
MCV: 87.9 fL (ref 78.0–100.0)
Platelets: 189 10*3/uL (ref 150–400)
RBC: 3.54 MIL/uL — ABNORMAL LOW (ref 3.87–5.11)
WBC: 7.3 10*3/uL (ref 4.0–10.5)

## 2013-01-26 LAB — TROPONIN I
Troponin I: <0.3 ng/mL (ref <0.30)
Troponin I: <0.3 ng/mL (ref <0.30)

## 2013-01-26 LAB — MAGNESIUM: Magnesium: 1.7 mg/dL (ref 1.5–2.5)

## 2013-01-26 LAB — ETHANOL: Alcohol, Ethyl (B): <11 mg/dL (ref 0–11)

## 2013-01-26 MED ORDER — LOPERAMIDE HCL 2 MG PO CAPS
2.0000 mg | ORAL_CAPSULE | ORAL | Status: DC | PRN
  Filled 2013-01-26: qty 1

## 2013-01-26 MED ORDER — METOPROLOL SUCCINATE ER 100 MG PO TB24
100.0000 mg | ORAL_TABLET | Freq: Every day | ORAL | Status: DC
  Administered 2013-01-27: 100 mg via ORAL
  Filled 2013-01-26: qty 1

## 2013-01-26 MED ORDER — POTASSIUM CHLORIDE 10 MEQ/100ML IV SOLN
10.0000 meq | INTRAVENOUS | Status: AC
  Administered 2013-01-26 (×3): 10 meq via INTRAVENOUS
  Filled 2013-01-26 (×3): qty 100

## 2013-01-26 MED ORDER — SODIUM CHLORIDE 0.9 % IJ SOLN: 3.0000 mL | Freq: Two times a day (BID) | INTRAMUSCULAR | Status: DC

## 2013-01-26 MED ORDER — HYDROCHLOROTHIAZIDE 25 MG PO TABS
25.0000 mg | ORAL_TABLET | Freq: Every day | ORAL | Status: DC
  Administered 2013-01-27: 25 mg via ORAL
  Filled 2013-01-26: qty 1

## 2013-01-26 MED ORDER — ONDANSETRON HCL 4 MG PO TABS: 4.0000 mg | ORAL_TABLET | Freq: Four times a day (QID) | ORAL | Status: DC | PRN

## 2013-01-26 MED ORDER — TECHNETIUM TO 99M ALBUMIN AGGREGATED
6.0000 | Freq: Once | INTRAVENOUS | Status: AC | PRN
Start: 2013-01-26 — End: 2013-01-26
  Administered 2013-01-26: 6 via INTRAVENOUS

## 2013-01-26 MED ORDER — HEPARIN SODIUM (PORCINE) 5000 UNIT/ML IJ SOLN
5000.0000 [IU] | Freq: Three times a day (TID) | INTRAMUSCULAR | Status: DC
  Administered 2013-01-26 – 2013-01-27 (×4): 5000 [IU] via SUBCUTANEOUS
  Filled 2013-01-26 (×6): qty 1

## 2013-01-26 MED ORDER — TECHNETIUM TC 99M DIETHYLENETRIAME-PENTAACETIC ACID: 40.0000 | Freq: Once | INTRAVENOUS | Status: DC | PRN

## 2013-01-26 MED ORDER — ONDANSETRON HCL 4 MG/2ML IJ SOLN
4.0000 mg | Freq: Once | INTRAMUSCULAR | Status: AC
  Administered 2013-01-26: 4 mg via INTRAVENOUS
  Filled 2013-01-26: qty 2

## 2013-01-26 MED ORDER — INFLUENZA VAC SPLIT QUAD 0.5 ML IM SUSP
0.5000 mL | INTRAMUSCULAR | Status: AC
  Administered 2013-01-27: 0.5 mL via INTRAMUSCULAR
  Filled 2013-01-26: qty 0.5

## 2013-01-26 MED ORDER — POTASSIUM CHLORIDE CRYS ER 20 MEQ PO TBCR
40.0000 meq | EXTENDED_RELEASE_TABLET | Freq: Once | ORAL | Status: AC
  Administered 2013-01-26: 40 meq via ORAL
  Filled 2013-01-26: qty 2

## 2013-01-26 MED ORDER — SODIUM CHLORIDE 0.9 % IV SOLN
INTRAVENOUS | Status: DC
  Administered 2013-01-26 – 2013-01-27 (×2): via INTRAVENOUS

## 2013-01-26 MED ORDER — ONDANSETRON HCL 4 MG/2ML IJ SOLN: 4.0000 mg | Freq: Four times a day (QID) | INTRAMUSCULAR | Status: DC | PRN

## 2013-01-26 MED ORDER — SODIUM CHLORIDE 0.9 % IV BOLUS (SEPSIS)
1000.0000 mL | Freq: Once | INTRAVENOUS | Status: AC
  Administered 2013-01-26: 1000 mL via INTRAVENOUS

## 2013-01-26 MED ORDER — SODIUM CHLORIDE 0.9 % IV BOLUS (SEPSIS): 1000.0000 mL | Freq: Once | INTRAVENOUS | Status: DC

## 2013-01-26 NOTE — ED Notes (Signed)
Pt remains in Nuclear Medicine. Report called to floor, pt to go to floor upon return.

## 2013-01-26 NOTE — ED Notes (Signed)
Patient transported to X-ray 

## 2013-01-26 NOTE — ED Provider Notes (Addendum)
CSN: [id number]     Arrival date & time 01/26/13  1010 History   First MD Initiated Contact with Patient 01/26/13 1022     Chief Complaint  Patient presents with  . Chest Pain   (Consider location/radiation/quality/duration/timing/severity/associated sxs/prior Treatment) The history is provided by the patient.  Kimberly Frost is a 62 y.o. female hx of HTN, hepatitis here with palpitations, dizziness, vomiting, and diarrhea. She is under a lot of stress recently and a friend died last night. Today, had two episodes of vomiting and several episodes of diarrhea. No abdominal pain. Also has some palpitations and shortness of breath. Was seen here last month for similar symptoms and was hydrated and sent home. Hasn't seen family practice since then.    Past Medical History  Diagnosis Date  . Hypertension   . Hepatitis    History reviewed. No pertinent past surgical history. No family history on file. History  Substance Use Topics  . Smoking status: Former Research scientist (life sciences)  . Smokeless tobacco: Not on file  . Alcohol Use: Yes   OB History   Grav Para Term Preterm Abortions TAB SAB Ect Mult Living                 Review of Systems  Cardiovascular: Positive for chest pain and palpitations.  Gastrointestinal: Positive for vomiting and diarrhea.  All other systems reviewed and are negative.    Allergies  Review of patient's allergies indicates no known allergies.  Home Medications   Current Outpatient Rx  Name  Route  Sig  Dispense  Refill  . hydrochlorothiazide (HYDRODIURIL) 25 MG tablet   Oral   Take 1 tablet (25 mg total) by mouth daily.   30 tablet   0   . metoprolol succinate (TOPROL-XL) 50 MG 24 hr tablet   Oral   Take 2 tablets (100 mg total) by mouth daily. Take with or immediately following a meal.   60 tablet   0    BP 163/76  Pulse 104  Temp(Src) 98.7 F (37.1 C) (Oral)  Resp 22  Ht 5\' 6"  (1.676 m)  Wt 119 lb (53.978 kg)  BMI 19.22 kg/m2  SpO2 98% Physical  Exam  Nursing note and vitals reviewed. Constitutional: She is oriented to person, place, and time.  Chronically ill, tachycardic   HENT:  Head: Normocephalic.  Mouth/Throat: Oropharynx is clear and moist.  Eyes: Conjunctivae are normal. Pupils are equal, round, and reactive to light.  Neck: Neck supple.  Cardiovascular: Regular rhythm and normal heart sounds.   Tachycardic   Pulmonary/Chest: Effort normal and breath sounds normal. No respiratory distress. She has no wheezes. She has no rales.  Abdominal: Soft. Bowel sounds are normal. She exhibits no distension. There is no tenderness. There is no rebound and no guarding.  Musculoskeletal: Normal range of motion. She exhibits no edema and no tenderness.  Neurological: She is alert and oriented to person, place, and time.  Skin: Skin is warm and dry.  Psychiatric: She has a normal mood and affect. Her behavior is normal. Judgment and thought content normal.    ED Course  Procedures (including critical care time) Labs Review Labs Reviewed  CBC WITH DIFFERENTIAL - Abnormal; Notable for the following:    WBC 12.2 (*)    Hemoglobin 11.6 (*)    HCT 34.3 (*)    Neutro Abs 8.4 (*)    All other components within normal limits  BASIC METABOLIC PANEL - Abnormal; Notable for the following:  Potassium 2.7 (*)    CO2 13 (*)    BUN 47 (*)    Creatinine, Ser 1.92 (*)    GFR calc non Af Amer 27 (*)    GFR calc Af Amer 31 (*)    All other components within normal limits  D-DIMER, QUANTITATIVE - Abnormal; Notable for the following:    D-Dimer, Quant 0.86 (*)    All other components within normal limits  POCT I-STAT 3, BLOOD GAS (G3+) - Abnormal; Notable for the following:    pH, Arterial 7.251 (*)    pCO2 arterial 29.3 (*)    Bicarbonate 12.9 (*)    Acid-base deficit 13.0 (*)    All other components within normal limits  MAGNESIUM  BLOOD GAS, ARTERIAL  LACTIC ACID, PLASMA  URINE RAPID DRUG SCREEN (HOSP PERFORMED)  SALICYLATE LEVEL   ACETAMINOPHEN LEVEL  ETHANOL  POCT I-STAT TROPONIN I   Imaging Review Dg Chest 2 View  01/26/2013   CLINICAL DATA:  Chest pain and dyspnea  EXAM: CHEST  2 VIEW  COMPARISON:  December 22, 2012.  FINDINGS: The lungs are mildly hyperinflated. There is no focal infiltrate. The cardiopericardial silhouette is normal in size. The pulmonary vascularity is not engorged. There is no pleural effusion or pneumothorax. There is mild stable soft tissue fullness in the right paratracheal region. There is mild tortuosity of the descending thoracic aorta. The observed portions of the bony thorax appear normal.  IMPRESSION: There is mild hyperinflation which may reflect reactive airway disease or COPD. There is no focal pneumonia. Coarse lung markings in the left infrahilar region may reflect scarring or could reflect subsegmental atelectasis. There is no evidence of a pneumothorax nor pleural effusion.   Electronically Signed   By: Suhani Stillion  [place]   On: 01/26/2013 10:58    EKG Interpretation    Date/Time:  Thursday January 26 2013 10:12:06 EST Ventricular Rate:  115 PR Interval:  152 QRS Duration: 80 QT Interval:  324 QTC Calculation: 448 R Axis:   43 Text Interpretation:  Sinus tachycardia Biatrial enlargement ST \\T \ T wave abnormality, consider lateral ischemia Abnormal ECG No significant change since last tracing Confirmed by Susan Arana  MD, Yacqub Baston ([id number]) on 01/26/2013 10:41:42 AM            MDM   1. Dehydration   2. Renal failure (ARF), acute on chronic   3. Hypokalemia   4. D-dimer, elevated    Kimberly Frost is a 62 y.o. female here with palpitations, dehydration. She likely has gastroenteritis. Given SOB, will get d-dimer.   11:50 AM Labs showed acute on chronic renal failure likely from dehydration. K 2.7, supplemented. Bicarb 13. D-dimer positive but Cr. 1.9 so VQ ordered. I called family practice to admit for dehydration, hypokalemia, acute on chronic renal failure, possible PE.   12:06  PM ABG showed metabolic acidosis, likely from dehydration, with respiratory compensation. I ordered lactate.   Wandra Arthurs, MD 01/26/13 1152  Wandra Arthurs, MD 01/26/13 [phone]

## 2013-01-26 NOTE — ED Notes (Signed)
Venous doppler at bedside.

## 2013-01-26 NOTE — H&P (Signed)
FMTS Attending Admit NOte Patient seen and examined by me, discussed with admitting resident and I agree with Dr Awanda Mink' admission H&P assessment and plan.  Patient appears very well on exam; reports resolution of her nausea and vomiting.  Denies chest pain or dyspnea, fevers/chills, cough, or sputum production.  Has had small amount of diarrhoea in the past 2 days.  Her ABG is notable for acidotic pH and low bicarb/PCO2, potassium is low and AG initially is 17.  Thus far her toxicology labs are all negative.  VQ scan low-probability for PE.  Plan for aggressive IVF rehydration and close monitoring of lytes and repeat ABG.  Repletion of K+.  To watch for resolution of AKI and AG, acidosis.  Check urine for presence of Ketones. Glucose was 93 on initial metabolic panel.  Dalbert Mayotte, MD

## 2013-01-26 NOTE — ED Notes (Signed)
Transported to nuclear medicine.  

## 2013-01-26 NOTE — Progress Notes (Signed)
VASCULAR LAB PRELIMINARY  PRELIMINARY  PRELIMINARY  PRELIMINARY  Bilateral lower extremity venous Dopplers completed.    Preliminary report:  There is no DVT or SVT noted in the bilateral lower extremities.  Venous flow is pulsatile suggestive of fluid overload.  Tashonna Descoteaux, RVT 01/26/2013, 12:51 PM

## 2013-01-26 NOTE — ED Notes (Signed)
Cp palpitations and dizziness since this am

## 2013-01-26 NOTE — ED Notes (Signed)
RT at bedside obtaining ABG

## 2013-01-26 NOTE — ED Notes (Addendum)
States felt fine when woke up, shortly upon awakening at 0830 c/o palpitations, heart racing, dizziness & n/v. Emesis x 2 PTA. Denies CP, SOB, cold, cough, fever, chills, diarrhea.  Reports under a lot of stress lately & that a very good friend of hers died last night. States vomited her BP meds this morning

## 2013-01-26 NOTE — H&P (Signed)
[facility] Admission History and Physical Service Pager: [phone]  Patient name: Kimberly Frost Medical record number: [id number] Date of birth: April 14, 1950 Age: 62 y.o. Gender: female  Primary Care Provider: Thersa Salt, DO Consultants: None Code Status: Full  Chief Complaint: n/v diarrhea and dizziness  Assessment and Plan: Kimberly Frost is a 62 y.o. female presenting with n/v, palpitations diarrhea following acute stressor yesterday. PMH is significant for HTN, hepatitis   #Metabolic acidosis- mixed AG/nonAG with [place] of 0.7; pH 7.251, AG of 17,Calculated osm 303,  likely multifactorial secondary to GI losses, possible ingestions, AKI. Has hx of alcohol abuse as well as marijuana abuse and depression; no evidence of need for bicarb at this time as pH is >7.2 and bicarb >8; pt attesting to recent alcohol ingestion of "3-4 shots" but expect it is quite a bit more, denies any drug use -obtaining UDS, tylenol, salicylate, ETOH levels, however, do not expect her EtOH to be elevated at this point with last reported drink >12 hrs ago.  Will add on LFT's as well to look for possible hepatic injury  -U/A to assess for ketonuria  -pending results of tox screen will tx with IVF rehydration  -zofran prn for nausea, immodium prn for diarrhea -track i/os -s/p 1 L bolus in the ED, repeat as needed with close monitoring of her renal fxn  -5pm BMET and ABG, trend for closure of gap  #Tachycardia- unknown etiology at this time, possibly 2/2 recent ingestions vs anxiety vs hyperthyroid vs PE (does have an elevated Ddimer) vs dehydration  ; Wells score 1.5 for tachycardia no signs DVT; EKG with sinus tach, does have increased body hair but denies a hx of thyroid disorder -obtaining v/q scan for elevated Cr -bilat dopplers ordered- read at bedside as negative -VS per floor protocol -TSH - Will cycle troponins and repeat EKG in AM, while on telemetry   #hypokalemia-  K noted to be 2.7 in the ED, in light of acidosis, likely secondary to vomiting and diarrhea as potassium should be driven from the cells; will replete with K runs in the IVF and trend with BMETs -NS + 20Kcl -daily BMETs  #AKI- likely 2/2 dehydration, would avoid contrast, expect to improve with IVF rehydration, s/p 1L bolus in the ED, will give another bolus and start continuous -monitor with BMETs -IVF@ 125  #HTN- on HCTZ and metoprolol XL at home; threw up medication this morning -will continue in house -vs monitoring per floor  FEN/GI: reg diet, IVF @125  Prophylaxis: HSQ, pending results of VTE studies   Disposition: admit to tele under Dr. Lindell Noe  History of Present Illness: Kimberly Frost is a 62 y.o. female presenting with acute onset palpitations, dizziness, vomiting and diarrhea after friend died last night. Noted onset of sx around 8 am this morning after wakening. Describes emesis as NBNB, no assc abdominal pain. Has noted some shortness of breath and racing heart with palpitation. Reports increased stress at work and is saddened over death of friend, recent hospitalization of sister in law and husband. Notes that she has had 2-3 "normal sized shots" of brandy last PM when she was making food for herself.  At that time as well, she denies any new food intake, caffeine intake, or changes in her diet. Reports 4 drinks per week. Denies cocaine, heroin, marijuana use.   Of note pt feels that her blood pressures have been elevated lately. Took her medication this morning, but does admit to  not being able to keep them down. No changes to recent medication regimens. Denies blurred vision, headaches initially although states that headaches started in the ED. No cough, sputum production, dysuria.  In the ED pt with persistent tachycardia, BMET with K 2.7, CO2 13, Cr 1.92. itrop 0.01, ddimer 0.86 (1.08 in 2011). CXR non focal. Given zofran 4mg  and kdur 53meq along with 1 L NS bolus and VQ scan  for possible PE is pending due to her renal fxn, with the inability to get a CTA.   Review Of Systems: Per HPI with the following additions: None Otherwise 12 point review of systems was performed and was unremarkable.  Patient Active Problem List   Diagnosis Date Noted  . ALCOHOL ABUSE 12/26/2008  . MARIJUANA ABUSE 12/26/2008  . DEPRESSION 12/26/2008  . TINNITUS, RIGHT 12/26/2008  . ESSENTIAL HYPERTENSION, BENIGN 12/26/2008   Past Medical History: Past Medical History  Diagnosis Date  . Hypertension   . Hepatitis    Past Surgical History: History reviewed. No pertinent past surgical history. Social History: History  Substance Use Topics  . Smoking status: Former Research scientist (life sciences)  . Smokeless tobacco: Not on file  . Alcohol Use: Yes   Additional social history: current alcohol use Please also refer to relevant sections of EMR.  Family History: No family history on file. Allergies and Medications: No Known Allergies No current facility-administered medications on file prior to encounter.   Current Outpatient Prescriptions on File Prior to Encounter  Medication Sig Dispense Refill  . hydrochlorothiazide (HYDRODIURIL) 25 MG tablet Take 1 tablet (25 mg total) by mouth daily.  30 tablet  0  . metoprolol succinate (TOPROL-XL) 50 MG 24 hr tablet Take 2 tablets (100 mg total) by mouth daily. Take with or immediately following a meal.  60 tablet  0    Objective: BP 163/76  Pulse 104  Temp(Src) 98.7 F (37.1 C) (Oral)  Resp 22  Ht 5\' 6"  (1.676 m)  Wt 119 lb (53.978 kg)  BMI 19.22 kg/m2  SpO2 98% Exam: General: NAD, thin, lying quietly in bed HEENT: NCAT, dry MMM, PERRL, EOMI no nystagmus Neck: No thyroid enlargement or nodularities noted  Cardiovascular: regular rhythm, tachycardic, no m/r/g Respiratory: CTAB, no wheezes Abdomen: soft, nt/nd, normoactive, no HSM  Extremities: WWP Skin: no lesions, rashes, excess hair growth on back and forearms Neuro: a&oX3, no focal  deficits, FTN without endpoint  Labs and Imaging: CBC BMET   Recent Labs Lab 01/26/13 1035  WBC 12.2*  HGB 11.6*  HCT 34.3*  PLT 209    Recent Labs Lab 01/26/13 1035  NA 141  K 2.7*  CL 101  CO2 13*  BUN 47*  CREATININE 1.92*  GLUCOSE 93  CALCIUM 9.5     IMPRESSION:  There is mild hyperinflation which may reflect reactive airway  disease or COPD. There is no focal pneumonia. Coarse lung markings  in the left infrahilar region may reflect scarring or could reflect  subsegmental atelectasis. There is no evidence of a pneumothorax nor  pleural effusion.    EKG: Sinus tachycardia, TWI lateral leads which is unchanged from previous EKG  Langston Masker, MD 01/26/2013, 11:58 AM PGY-1, [facility] pager: [phone], text pages welcome  I have seen and evaluated the patient with Dr. Skeet Simmer, please refer to her excellent note above with my additions in Red.  Thanks, Tamela Oddi. Awanda Mink, DO of [facility] 01/26/2013, 1:45 PM

## 2013-01-27 DIAGNOSIS — N179 Acute kidney failure, unspecified: Secondary | ICD-10-CM

## 2013-01-27 DIAGNOSIS — R0602 Shortness of breath: Secondary | ICD-10-CM

## 2013-01-27 DIAGNOSIS — R002 Palpitations: Secondary | ICD-10-CM

## 2013-01-27 DIAGNOSIS — R000 Tachycardia, unspecified: Secondary | ICD-10-CM

## 2013-01-27 DIAGNOSIS — N189 Chronic kidney disease, unspecified: Secondary | ICD-10-CM

## 2013-01-27 LAB — BASIC METABOLIC PANEL
BUN: 37 mg/dL — ABNORMAL HIGH (ref 6–23)
CO2: 16 mEq/L — ABNORMAL LOW (ref 19–32)
CO2: 21 mEq/L (ref 19–32)
Calcium: 8.1 mg/dL — ABNORMAL LOW (ref 8.4–10.5)
Chloride: 112 mEq/L (ref 96–112)
Chloride: 109 mEq/L (ref 96–112)
Creatinine, Ser: 1.61 mg/dL — ABNORMAL HIGH (ref 0.50–1.10)
GFR calc Af Amer: 39 mL/min — ABNORMAL LOW (ref >90)
GFR calc non Af Amer: 33 mL/min — ABNORMAL LOW (ref >90)
Glucose, Bld: 107 mg/dL — ABNORMAL HIGH (ref 70–99)
Potassium: 3.4 mEq/L — ABNORMAL LOW (ref 3.5–5.1)
Potassium: 3.4 mEq/L — ABNORMAL LOW (ref 3.5–5.1)
Sodium: 141 mEq/L (ref 135–145)

## 2013-01-27 LAB — URINALYSIS, ROUTINE W REFLEX MICROSCOPIC
Bilirubin Urine: NEGATIVE
Glucose, UA: NEGATIVE mg/dL
Ketones, ur: NEGATIVE mg/dL
Nitrite: NEGATIVE
Specific Gravity, Urine: 1.011 (ref 1.005–1.030)
pH: 6.5 (ref 5.0–8.0)

## 2013-01-27 LAB — URINE MICROSCOPIC-ADD ON

## 2013-01-27 MED ORDER — TRAZODONE HCL 50 MG PO TABS
50.0000 mg | ORAL_TABLET | Freq: Every day | ORAL | Status: DC
  Administered 2013-01-27: 50 mg via ORAL
  Filled 2013-01-27 (×2): qty 1

## 2013-01-27 MED ORDER — POTASSIUM CHLORIDE ER 20 MEQ PO TBCR: 10.0000 meq | EXTENDED_RELEASE_TABLET | Freq: Every day | ORAL | Status: DC

## 2013-01-27 MED ORDER — MENTHOL 3 MG MT LOZG
1.0000 | LOZENGE | OROMUCOSAL | Status: DC | PRN
  Administered 2013-01-27: 3 mg via ORAL
  Filled 2013-01-27: qty 9

## 2013-01-27 MED ORDER — POTASSIUM CHLORIDE CRYS ER 20 MEQ PO TBCR
20.0000 meq | EXTENDED_RELEASE_TABLET | Freq: Two times a day (BID) | ORAL | Status: DC
  Administered 2013-01-27: 20 meq via ORAL
  Filled 2013-01-27: qty 1

## 2013-01-27 MED ORDER — SODIUM BICARBONATE 8.4 % IV SOLN
100.0000 meq | Freq: Once | INTRAVENOUS | Status: AC
  Administered 2013-01-27: 100 meq via INTRAVENOUS
  Filled 2013-01-27 (×2): qty 100

## 2013-01-27 MED ORDER — HYDRALAZINE HCL 20 MG/ML IJ SOLN: 5.0000 mg | Freq: Four times a day (QID) | INTRAMUSCULAR | Status: DC | PRN

## 2013-01-27 NOTE — Progress Notes (Signed)
FMTS Attending Note  I personally saw and evaluated the patient. The plan of care was discussed with the resident team. I agree with the assessment and plan as documented by the resident.   Patient feels well this AM, no further dizziness/palpitations/chest discomfort, sitting up in bed, eating breakfast, would like to go home today  Telemetry shows intermittent sinus tachycardia with intermittent PVC's  1. Tachycardia/palpitations - workup negative as outlined by resident, would recommend inpatient ECHO given intermittent PVC's, suggest outpatient cardiology follow up for possible stress test, patient currently asymptomatic 2. Acute on Chronic metabolic acidosis (mixed AG and non-AG) - etiology unclear however suspect GI losses vs alcoholism, RTA is also another possible etiology. Agree with bicarb. And recheck of HCO3, will need outpatient follow up 3. AKI - improved with IV rehydration  Disposition: home today if able to get ECHO and HCO3 improves with bicarbonate.   Dossie Arbour MD

## 2013-01-27 NOTE — Discharge Summary (Signed)
I agree with the discharge summary as documented.   Kyle Fletke MD  

## 2013-01-27 NOTE — Progress Notes (Signed)
Patient discharged per MD. Discharge instructions provided. NSL discontinued. Will escort out to [place] entrance via wheelchair.

## 2013-01-27 NOTE — Discharge Summary (Signed)
[facility] Discharge Summary  Patient name: Kimberly Frost Medical record number: [id number] Date of birth: 1950/12/13 Age: 62 y.o. Gender: female Date of Admission: 01/26/2013  Date of Discharge: 01/27/2013 Admitting Physician: Willeen Niece, MD  Primary Care Provider: Thersa Salt, DO  Indication for Hospitalization: Nausea, vomiting, palpitations, diarrhea.  Discharge Diagnoses:  1. Metabolic acidosis 2. Tachycardia 3. Hypokalemia 4. Acute kidney injury 5. Hypertension  Consultations: None  Significant Labs and Imaging:  CXR 12/25  IMPRESSION:  There is mild hyperinflation which may reflect reactive airway  disease or COPD. There is no focal pneumonia. Coarse lung markings  in the left infrahilar region may reflect scarring or could reflect  subsegmental atelectasis. There is no evidence of a pneumothorax nor  pleural effusion.   V/Q scan 12/25  IMPRESSION:  No appreciable ventilation or perfusion defects. Very low  probability of pulmonary embolus.    Recent Labs Lab 01/26/13 1035 01/26/13 1133 01/26/13 1655 01/26/13 2212 01/27/13 0433 01/27/13 1224  NA 141  --  139 138 142 141  K 2.7*  --  4.5 4.0 3.4* 3.4*  CL 101  --  104 107 112 109  CO2 13*  --  13* 17* 16* 21  GLUCOSE 93  --  95 116* 89 107*  BUN 47*  --  46* 43* 37* 31*  CREATININE 1.92*  --  1.74* 1.75* 1.61* 1.55*  CALCIUM 9.5  --  8.7 8.2* 7.8* 8.1*  MG  --  1.7  --   --   --   --     Recent Labs Lab 01/26/13 1035 01/26/13 2212  HGB 11.6* 10.6*  HCT 34.3* 31.1*  WBC 12.2* 7.3  PLT 209 189    Recent Labs Lab 01/26/13 1204  AST 79*  ALT 37*  ALKPHOS 60  BILITOT 0.4  PROT 8.0  ALBUMIN 4.0   Salicylate, acetaminophen, and urine drug screen all normal.  Procedures: None  Brief Hospital Course:  Kimberly Frost is a 62 y.o. female presenting with n/v, palpitations diarrhea following acute stressor the day before admission. PMH is significant for HTN,  hepatitis   #Metabolic acidosis Acute on chronic mixed anion gap and non-anion gap metabolic acidosis. The etiology still remains unclear, most likely it is multifactorial with some renal bicarbonate losses/RTA, GI losses, and possibly alcoholism. After treatment with IV fluids her gap closed, but her bicarbonate remained at 16. Her symptoms resolved as well. We treated her with bicarbonate which was corrected prior to discharge. She has a history of alcohol and marijuana abuse as well as depression so ingestion was suspected, however the UDS, alcohol level, and salicylate levels were all within normal limits. Her labs had normalized prior to discharge.   #Tachycardia Unknown etiology. She is monitored with telemetry and found to have intermittent PVCs. Bilateral lower extremity Dopplers were negative for DVT, VQ scan was obtained and very low probability for PE. Her TSH was in normal. We ruled out ACS with troponins that were negative x3. It had improved slightly with pulse from 85-96 prior to discharge.  #hypokalemia-  Possibly secondary to vomiting and diarrhea. We'll replace it in her IV fluids and with by mouth KCl. Recommended eating potassium rich foods and prescribed 20 mEq KCl daily until followup.  #AKI Likely secondary to dehydration. We treated her with IV fluids and her creatinine improved from 1.74-1.55 before discharge.  As it was elevated on November 20 this possibly represents progression to chronic kidney disease.   #  HTN Elevated while inpatient. Treated with HCTZ and metoprolol XL per home doses.  Discharge Medications:    Medication List         hydrochlorothiazide 25 MG tablet  Commonly known as:  HYDRODIURIL  Take 1 tablet (25 mg total) by mouth daily.     metoprolol succinate 50 MG 24 hr tablet  Commonly known as:  TOPROL-XL  Take 2 tablets (100 mg total) by mouth daily. Take with or immediately following a meal.     Potassium Chloride ER 20 MEQ Tbcr  Take 10 mEq  by mouth daily.       Issues for Follow Up:  - With frequent PVCs and tachycardia recommend outpatient cardiology evaluation for likely stress test. - Consider recheck BMP and possible causes of acidosis.  Outstanding Results:   Discharge Instructions: Please refer to Patient Instructions section of EMR for full details.  Patient was counseled important signs and symptoms that should prompt return to medical care, changes in medications, dietary instructions, activity restrictions, and follow up appointments.  Significant instructions noted below:  Follow-up Information   Call Thersa Salt, DO. (call for appt first thing monday for an appt in about 1 week. )    Specialty:  Family Medicine   Contact information:   [place] [address] [phone]       Discharge Condition: Margret Chance, MD 01/27/2013, 7:25 PM

## 2013-01-27 NOTE — Progress Notes (Signed)
Family Medicine Teaching Service Daily Progress Note Intern Pager: [phone]  Patient name: Kimberly Frost Medical record number: [id number] Date of birth: 1950-05-31 Age: 62 y.o. Gender: female  Primary Care Provider: Thersa Salt, DO Consultants: None Code Status: Full  Pt Overview and Major Events to Date:   Assessment and Plan: Kimberly Frost is a 62 y.o. female presenting with n/v, palpitations diarrhea following acute stressor the day before admission. PMH is significant for HTN, hepatitis   #Metabolic acidosis- mixed AG/nonAG with Delta Delta of 0.7; pH 7.251, AG of 17,Calculated osm 303, likely multifactorial secondary to GI losses, possible ingestions, AKI. Has hx of alcohol abuse as well as marijuana abuse and depression. - Gap closed, Bicarb remains low - Now s/p 2 amps of HCO3 for NAG acidosis - UDS, EtOH, and ASA WNL - U/A to assess for ketonuria pending - zofran prn for nausea, immodium prn for diarrhea  - repeat BMP at 1PM   #Tachycardia- unknown etiology at this time, possibly 2/2 recent ingestions vs anxiety vs hyperthyroid vs PE (does have an elevated Ddimer) vs dehydration ; Wells score 1.5 for tachycardia no signs DVT; EKG with sinus tach, does have increased body hair but denies a hx of thyroid disorder  -V/q scan very low proabability - bilat dopplers  ordered- read at bedside as negative  -VS per floor protocol  -TSH WNL, troponin neg X 3 - resolved with P 85-96 overnight.   #hypokalemia- K noted to be 2.7 in the ED, in light of acidosis, likely secondary to vomiting and diarrhea as potassium should be driven from the cells - NS + 20Kcl  - follow up BMP per above, discuss oral KCl vs potassium rich foods.   #AKI- likely 2/2 dehydration, would avoid contrast, expect to improve with IVF rehydration, s/p 1L bolus in the ED, will give another bolus and start continuous  -monitor with BMETs  -IVF@ 125   #HTN- on HCTZ and metoprolol XL at home - elevated this  am - will continue in house  - vs monitoring per floor   FEN/GI: reg diet, IVF @125  will decrease  To KVO Prophylaxis: HSQ  Disposition: Likely home today pending correction of acidosis  Subjective: Feeling much better. Denies additional episodes of palpitations, nausea, or dizziness. Had single episode of loose stool this am.   Objective: Temp:  [97.7 F (36.5 C)-99.1 F (37.3 C)] 97.7 F (36.5 C) (12/26 0910) Pulse Rate:  [85-118] 96 (12/26 0910) Resp:  [15-22] 20 (12/26 0910) BP: (161-194)/(76-102) 163/91 mmHg (12/26 0910) SpO2:  [98 %-100 %] 100 % (12/26 0910) Weight:  [120 lb 1.6 oz (54.477 kg)] 120 lb 1.6 oz (54.477 kg) (12/25 2021)  Physical Exam: Gen: NAD, alert, cooperative with exam HEENT: NCAT, EOMI, PERRL CV: RRR, good S1/S2, no murmur Resp: CTABL, no wheezes, non-labored Abd: SNTND, BS present, no guarding or organomegaly Ext: No edema, warm Neuro: Alert and oriented, No gross deficits  Laboratory:  Recent Labs Lab 01/26/13 1035 01/26/13 2212  WBC 12.2* 7.3  HGB 11.6* 10.6*  HCT 34.3* 31.1*  PLT 209 189    Recent Labs Lab 01/26/13 1204 01/26/13 1655 01/26/13 2212 01/27/13 0433  NA  --  139 138 142  K  --  4.5 4.0 3.4*  CL  --  104 107 112  CO2  --  13* 17* 16*  BUN  --  46* 43* 37*  CREATININE  --  1.74* 1.75* 1.61*  CALCIUM  --  8.7 8.2* 7.8*  PROT 8.0  --   --   --   BILITOT 0.4  --   --   --   ALKPHOS 60  --   --   --   ALT 37*  --   --   --   AST 79*  --   --   --   GLUCOSE  --  95 116* 89    Imaging/Diagnostic Tests:  CXR 12/25 IMPRESSION:  There is mild hyperinflation which may reflect reactive airway  disease or COPD. There is no focal pneumonia. Coarse lung markings  in the left infrahilar region may reflect scarring or could reflect  subsegmental atelectasis. There is no evidence of a pneumothorax nor  pleural effusion.  V/Q scan 12/25 IMPRESSION:  No appreciable ventilation or perfusion defects. Very low  probability  of pulmonary embolus.    Kimberly Euler, MD 01/27/2013, 10:58 AM PGY-2, [place] Intern pager: [phone], text pages welcome

## 2013-01-27 NOTE — Progress Notes (Signed)
  Echocardiogram 2D Echocardiogram has been performed.  Kimberly Frost FRANCES 01/27/2013, 5:46 PM

## 2013-01-29 LAB — URINE CULTURE: Colony Count: >=100000

## 2013-02-13 ENCOUNTER — Inpatient Hospital Stay: Payer: Self-pay | Admitting: Family Medicine

## 2013-02-15 ENCOUNTER — Ambulatory Visit (INDEPENDENT_AMBULATORY_CARE_PROVIDER_SITE_OTHER): Payer: Self-pay | Admitting: Family Medicine

## 2013-02-15 VITALS — BP 165/75 | HR 80 | Temp 98.3°F | Ht 66.0 in | Wt 125.0 lb

## 2013-02-15 DIAGNOSIS — F1010 Alcohol abuse, uncomplicated: Secondary | ICD-10-CM

## 2013-02-15 DIAGNOSIS — E872 Acidosis, unspecified: Secondary | ICD-10-CM

## 2013-02-15 DIAGNOSIS — B1920 Unspecified viral hepatitis C without hepatic coma: Secondary | ICD-10-CM

## 2013-02-15 DIAGNOSIS — I10 Essential (primary) hypertension: Secondary | ICD-10-CM

## 2013-02-15 DIAGNOSIS — Z0000 Encounter for general adult medical examination without abnormal findings: Secondary | ICD-10-CM

## 2013-02-15 LAB — COMPREHENSIVE METABOLIC PANEL
ALBUMIN: 4.2 g/dL (ref 3.5–5.2)
ALT: 39 U/L — ABNORMAL HIGH (ref 0–35)
AST: 52 U/L — AB (ref 0–37)
Alkaline Phosphatase: 47 U/L (ref 39–117)
BUN: 51 mg/dL — ABNORMAL HIGH (ref 6–23)
CALCIUM: 9.2 mg/dL (ref 8.4–10.5)
CHLORIDE: 109 meq/L (ref 96–112)
CO2: 22 mEq/L (ref 19–32)
Creat: 2 mg/dL — ABNORMAL HIGH (ref 0.50–1.10)
Glucose, Bld: 90 mg/dL (ref 70–99)
POTASSIUM: 3.7 meq/L (ref 3.5–5.3)
Sodium: 140 mEq/L (ref 135–145)
Total Bilirubin: 0.6 mg/dL (ref 0.3–1.2)
Total Protein: 7.6 g/dL (ref 6.0–8.3)

## 2013-02-15 LAB — CBC
HCT: 31.5 % — ABNORMAL LOW (ref 36.0–46.0)
Hemoglobin: 10.6 g/dL — ABNORMAL LOW (ref 12.0–15.0)
MCH: 29.1 pg (ref 26.0–34.0)
MCHC: 33.7 g/dL (ref 30.0–36.0)
MCV: 86.5 fL (ref 78.0–100.0)
PLATELETS: 229 10*3/uL (ref 150–400)
RBC: 3.64 MIL/uL — ABNORMAL LOW (ref 3.87–5.11)
RDW: 15.1 % (ref 11.5–15.5)
WBC: 6.8 10*3/uL (ref 4.0–10.5)

## 2013-02-15 MED ORDER — METOPROLOL SUCCINATE ER 50 MG PO TB24: 100.0000 mg | ORAL_TABLET | Freq: Every day | ORAL | Status: DC

## 2013-02-15 MED ORDER — HYDROCHLOROTHIAZIDE 25 MG PO TABS: 25.0000 mg | ORAL_TABLET | Freq: Every day | ORAL | Status: DC

## 2013-02-15 NOTE — Assessment & Plan Note (Signed)
Patient reports a history of hepatitis C and prior treatment. No labs available. We'll obtain HCV antibody, RNA Quant and genotype today.

## 2013-02-15 NOTE — Progress Notes (Signed)
Subjective:     Patient ID: Kimberly Frost, female   DOB: Feb 05, 1950, 63 y.o.   MRN: [id number]  HPI 63 year old female with history of hepatitis C, hypertension, alcohol and marijuana abuse, and depression presents for hospital followup.  Patient was admitted from 12/25 to 12/26 for nausea/vomiting/diarrhea.  She was found to have acute kidney injury and metabolic acidosis during admission.  Acidosis was mixed and attributed to GI losses and alcohol abuse.  She was treated with IV fluids and bicarbonate with resolution of her acidosis.  Creatinine improved during admission it was 1.55 at discharge.  Patient reports she is feeling well.  She has no complaints today.  Patient is in need of medication refills.  Patient denies any chest pain.  Patient does report shortness of breath with physical activity particularly climbing up stairs.    Review of Systems No abdominal pain, nausea, vomiting, dysuria.    Objective:   Physical Exam Exam: General: well appearing female in NAD.  Cardiovascular: RRR. No murmurs, rubs, or gallops. Respiratory: CTAB. No rales, rhonchi, or wheeze. Abdomen: soft, nontender, nondistended. No organomegaly appreciated.     Assessment/Plan:    See Problem List

## 2013-02-15 NOTE — Patient Instructions (Signed)
It was nice to see you today.  I will (or my staff) will call you with the results of your labs.  Follow up in 1-3 months.

## 2013-02-15 NOTE — Assessment & Plan Note (Signed)
Patient has no insurance and was given information regarding orange card. Patient is in need of Pap smear, mammogram, colonoscopy. We'll discuss this at followup.

## 2013-02-15 NOTE — Assessment & Plan Note (Addendum)
Will repeat CMP today to reevaluate.

## 2013-02-16 LAB — HCV RNA QUANT RFLX ULTRA OR GENOTYP
HCV Quantitative Log: 5.31 {Log} — ABNORMAL HIGH (ref <1.18)
HCV Quantitative: 204444 IU/mL — ABNORMAL HIGH (ref <15)

## 2013-02-16 LAB — HEPATITIS C ANTIBODY: HCV Ab: REACTIVE — AB

## 2013-02-21 LAB — HEPATITIS C GENOTYPE

## 2014-05-22 ENCOUNTER — Ambulatory Visit: Payer: Self-pay | Admitting: Family Medicine

## 2014-11-15 ENCOUNTER — Ambulatory Visit (INDEPENDENT_AMBULATORY_CARE_PROVIDER_SITE_OTHER): Payer: Self-pay | Admitting: Internal Medicine

## 2014-11-15 ENCOUNTER — Encounter: Payer: Self-pay | Admitting: Internal Medicine

## 2014-11-15 VITALS — BP 138/90 | HR 68 | Temp 98.0°F | Ht 66.0 in | Wt 109.0 lb

## 2014-11-15 DIAGNOSIS — I10 Essential (primary) hypertension: Secondary | ICD-10-CM

## 2014-11-15 DIAGNOSIS — F1210 Cannabis abuse, uncomplicated: Secondary | ICD-10-CM

## 2014-11-15 DIAGNOSIS — B182 Chronic viral hepatitis C: Secondary | ICD-10-CM

## 2014-11-15 NOTE — Assessment & Plan Note (Signed)
Advised him to cut back

## 2014-11-15 NOTE — Patient Instructions (Signed)
Hypertension Hypertension, commonly called high blood pressure, is when the force of blood pumping through your arteries is too strong. Your arteries are the blood vessels that carry blood from your heart throughout your body. A blood pressure reading consists of a higher number over a lower number, such as 110/72. The higher number (systolic) is the pressure inside your arteries when your heart pumps. The lower number (diastolic) is the pressure inside your arteries when your heart relaxes. Ideally you want your blood pressure below 120/80. Hypertension forces your heart to work harder to pump blood. Your arteries may become narrow or stiff. Having untreated or uncontrolled hypertension can cause heart attack, stroke, kidney disease, and other problems. RISK FACTORS Some risk factors for high blood pressure are controllable. Others are not.  Risk factors you cannot control include:   Race. You may be at higher risk if you are African American.  Age. Risk increases with age.  Gender. Men are at higher risk than women before age 45 years. After age 65, women are at higher risk than men. Risk factors you can control include:  Not getting enough exercise or physical activity.  Being overweight.  Getting too much fat, sugar, calories, or salt in your diet.  Drinking too much alcohol. SIGNS AND SYMPTOMS Hypertension does not usually cause signs or symptoms. Extremely high blood pressure (hypertensive crisis) may cause headache, anxiety, shortness of breath, and nosebleed. DIAGNOSIS To check if you have hypertension, your health care provider will measure your blood pressure while you are seated, with your arm held at the level of your heart. It should be measured at least twice using the same arm. Certain conditions can cause a difference in blood pressure between your right and left arms. A blood pressure reading that is higher than normal on one occasion does not mean that you need treatment. If  it is not clear whether you have high blood pressure, you may be asked to return on a different day to have your blood pressure checked again. Or, you may be asked to monitor your blood pressure at home for 1 or more weeks. TREATMENT Treating high blood pressure includes making lifestyle changes and possibly taking medicine. Living a healthy lifestyle can help lower high blood pressure. You may need to change some of your habits. Lifestyle changes may include:  Following the DASH diet. This diet is high in fruits, vegetables, and whole grains. It is low in salt, red meat, and added sugars.  Keep your sodium intake below 2,300 mg per day.  Getting at least 30-45 minutes of aerobic exercise at least 4 times per week.  Losing weight if necessary.  Not smoking.  Limiting alcoholic beverages.  Learning ways to reduce stress. Your health care provider may prescribe medicine if lifestyle changes are not enough to get your blood pressure under control, and if one of the following is true:  You are 18-59 years of age and your systolic blood pressure is above 140.  You are 60 years of age or older, and your systolic blood pressure is above 150.  Your diastolic blood pressure is above 90.  You have diabetes, and your systolic blood pressure is over 140 or your diastolic blood pressure is over 90.  You have kidney disease and your blood pressure is above 140/90.  You have heart disease and your blood pressure is above 140/90. Your personal target blood pressure may vary depending on your medical conditions, your age, and other factors. HOME CARE INSTRUCTIONS    Have your blood pressure rechecked as directed by your health care provider.   Take medicines only as directed by your health care provider. Follow the directions carefully. Blood pressure medicines must be taken as prescribed. The medicine does not work as well when you skip doses. Skipping doses also puts you at risk for  problems.  Do not smoke.   Monitor your blood pressure at home as directed by your health care provider. SEEK MEDICAL CARE IF:   You think you are having a reaction to medicines taken.  You have recurrent headaches or feel dizzy.  You have swelling in your ankles.  You have trouble with your vision. SEEK IMMEDIATE MEDICAL CARE IF:  You develop a severe headache or confusion.  You have unusual weakness, numbness, or feel faint.  You have severe chest or abdominal pain.  You vomit repeatedly.  You have trouble breathing. MAKE SURE YOU:   Understand these instructions.  Will watch your condition.  Will get help right away if you are not doing well or get worse.   This information is not intended to replace advice given to you by your health care provider. Make sure you discuss any questions you have with your health care provider.   Document Released: 01/19/2005 Document Revised: 06/05/2014 Document Reviewed: 11/11/2012 Elsevier Interactive Patient Education 2016 [employer]  

## 2014-11-15 NOTE — Assessment & Plan Note (Signed)
She is interested in restarting treatment but wants to wait until her insurance kicks in  RTC in 3 months for your annual exam, we will get labs and refer you to a liver care clinic at this time

## 2014-11-15 NOTE — Progress Notes (Signed)
Pre visit review using our clinic review tool, if applicable. No additional management support is needed unless otherwise documented below in the visit note. 

## 2014-11-15 NOTE — Assessment & Plan Note (Signed)
BP at goal today No need to restart medications at this time Will continue to monitor

## 2014-11-15 NOTE — Progress Notes (Signed)
HPI  Pt presents to the clinic today to establish care and for management of the conditions listed below. She has not had a PCP in many years.  HTN: She stopped taking her HCTZ and Metoprolol 1 year ago but she stopped taking it because she did not want to take medications.  Hep C: She denies any history of IV drug use. She reports she started Harvoni treatment a few years ago but did not finish treatment because it made her so sick. She is not following with a liver care clinic.  Alcohol and Substance abuse: She uses marijuana occasionally. She reports she only drinks a 6 pack of beer per week.   Flu: 2015 Tetanus: 2010 Zosotvax: never Pap Smear: > 5 years ago Mammogram: > 5 years ago Colon Screening: never Vision Screening: as needed Dentist: as needed  Past Medical History  Diagnosis Date  . Hypertension   . Hepatitis     No current outpatient prescriptions on file.   No current facility-administered medications for this visit.    No Known Allergies  Family History  Problem Relation Age of Onset  . Diabetes Mother   . Hypertension Mother   . Bone cancer Father   . Stroke Brother   . Diabetes Brother   . Kidney cancer Maternal Grandmother     Social History   Social History  . Marital Status: Single    Spouse Name: N/A  . Number of Children: N/A  . Years of Education: N/A   Occupational History  . Not on file.   Social History Main Topics  . Smoking status: Former Research scientist (life sciences)  . Smokeless tobacco: Never Used     Comment: 30+ years ago  . Alcohol Use: 0.0 oz/week    0 Standard drinks or equivalent per week     Comment: occasional  . Drug Use: Yes    Special: Marijuana  . Sexual Activity: Not on file   Other Topics Concern  . Not on file   Social History Narrative    ROS:  Constitutional: Pt reports fatigue. Denies fever, malaise, headache or abrupt weight changes.  HEENT: Denies eye pain, eye redness, ear pain, ringing in the ears, wax buildup,  runny nose, nasal congestion, bloody nose, or sore throat. Respiratory: Denies difficulty breathing, shortness of breath, cough or sputum production.   Cardiovascular: Denies chest pain, chest tightness, palpitations or swelling in the hands or feet.  Gastrointestinal: Denies abdominal pain, bloating, constipation, diarrhea or blood in the stool.  GU: Denies frequency, urgency, pain with urination, blood in urine, odor or discharge. Musculoskeletal: Denies decrease in range of motion, difficulty with gait, muscle pain or joint pain and swelling.  Skin: Denies redness, rashes, lesions or ulcercations.  Neurological: Denies dizziness, difficulty with memory, difficulty with speech or problems with balance and coordination.  Psych: Denies anxiety, depression, SI/HI.  No other specific complaints in a complete review of systems (except as listed in HPI above).  PE:  BP 138/90 mmHg  Pulse 68  Temp(Src) 98 F (36.7 C) (Oral)  Ht '5\' 6"'$  (1.676 m)  Wt 109 lb (49.442 kg)  BMI 17.60 kg/m2  SpO2 98% Wt Readings from Last 3 Encounters:  11/15/14 109 lb (49.442 kg)  02/15/13 125 lb (56.7 kg)  01/26/13 120 lb 1.6 oz (54.477 kg)    General: Appears her stated age, undernourished in NAD. HEENT: Head: normal shape and size; Eyes: exophthalmus noted. Neck: Neck supple, trachea midline. No masses, lumps or thyromegaly present.  Cardiovascular:  Normal rate and rhythm. S1,S2 noted.  No murmur, rubs or gallops noted.  Pulmonary/Chest: Normal effort and positive vesicular breath sounds. No respiratory distress. No wheezes, rales or ronchi noted.  Neurological: Alert and oriented. Psychiatric: Her affect is flat. She does engage. Her behavior and thought content are normal.  BMET    Component Value Date/Time   NA 140 02/15/2013 1551   K 3.7 02/15/2013 1551   CL 109 02/15/2013 1551   CO2 22 02/15/2013 1551   GLUCOSE 90 02/15/2013 1551   BUN 51* 02/15/2013 1551   CREATININE 2.00* 02/15/2013 1551    CREATININE 1.55* 01/27/2013 1224   CALCIUM 9.2 02/15/2013 1551   GFRNONAA 35* 01/27/2013 1224   GFRAA 41* 01/27/2013 1224    Lipid Panel     Component Value Date/Time   CHOL 254* 12/26/2008 2037   TRIG 110 12/26/2008 2037   HDL 58 12/26/2008 2037   CHOLHDL 4.4 Ratio 12/26/2008 2037   VLDL 22 12/26/2008 2037   LDLCALC 174* 12/26/2008 2037    CBC    Component Value Date/Time   WBC 6.8 02/15/2013 1551   RBC 3.64* 02/15/2013 1551   HGB 10.6* 02/15/2013 1551   HCT 31.5* 02/15/2013 1551   PLT 229 02/15/2013 1551   MCV 86.5 02/15/2013 1551   MCH 29.1 02/15/2013 1551   MCHC 33.7 02/15/2013 1551   RDW 15.1 02/15/2013 1551   LYMPHSABS 3.0 01/26/2013 1035   MONOABS 0.7 01/26/2013 1035   EOSABS 0.0 01/26/2013 1035   BASOSABS 0.0 01/26/2013 1035    Hgb A1C No results found for: HGBA1C   Assessment and Plan:

## 2016-05-11 ENCOUNTER — Encounter: Payer: Self-pay | Admitting: Internal Medicine

## 2016-05-11 ENCOUNTER — Ambulatory Visit: Payer: Self-pay | Admitting: Primary Care

## 2016-05-11 ENCOUNTER — Ambulatory Visit (INDEPENDENT_AMBULATORY_CARE_PROVIDER_SITE_OTHER): Payer: Medicare Other | Admitting: Internal Medicine

## 2016-05-11 ENCOUNTER — Ambulatory Visit (INDEPENDENT_AMBULATORY_CARE_PROVIDER_SITE_OTHER)
Admission: RE | Admit: 2016-05-11 | Discharge: 2016-05-11 | Disposition: A | Discharge status: Home / Self Care | Payer: Medicare Other | Source: Ambulatory Visit | Attending: Internal Medicine | Admitting: Internal Medicine

## 2016-05-11 VITALS — BP 168/108 | HR 100 | Temp 98.3°F | Wt 112.5 lb

## 2016-05-11 DIAGNOSIS — M79671 Pain in right foot: Secondary | ICD-10-CM | POA: Diagnosis not present

## 2016-05-11 DIAGNOSIS — I10 Essential (primary) hypertension: Secondary | ICD-10-CM

## 2016-05-11 DIAGNOSIS — J301 Allergic rhinitis due to pollen: Secondary | ICD-10-CM

## 2016-05-11 DIAGNOSIS — H6121 Impacted cerumen, right ear: Secondary | ICD-10-CM

## 2016-05-11 MED ORDER — NAPROXEN 500 MG PO TABS: 500.0000 mg | ORAL_TABLET | Freq: Two times a day (BID) | ORAL | 0 refills | Status: DC

## 2016-05-11 MED ORDER — AMLODIPINE BESY-BENAZEPRIL HCL 10-20 MG PO CAPS: 1.0000 | ORAL_CAPSULE | Freq: Every day | ORAL | 0 refills | Status: DC

## 2016-05-11 NOTE — Patient Instructions (Signed)
Hypertension Hypertension is another name for high blood pressure. High blood pressure forces your heart to work harder to pump blood. This can cause problems over time. There are two numbers in a blood pressure reading. There is a top number (systolic) over a bottom number (diastolic). It is best to have a blood pressure below 120/80. Healthy choices can help lower your blood pressure. You may need medicine to help lower your blood pressure if:  Your blood pressure cannot be lowered with healthy choices.  Your blood pressure is higher than 130/80. Follow these instructions at home: Eating and drinking   If directed, follow the DASH eating plan. This diet includes:  Filling half of your plate at each meal with fruits and vegetables.  Filling one quarter of your plate at each meal with whole grains. Whole grains include whole wheat pasta, brown rice, and whole grain bread.  Eating or drinking low-fat dairy products, such as skim milk or low-fat yogurt.  Filling one quarter of your plate at each meal with low-fat (lean) proteins. Low-fat proteins include fish, skinless chicken, eggs, beans, and tofu.  Avoiding fatty meat, cured and processed meat, or chicken with skin.  Avoiding premade or processed food.  Eat less than 1,500 mg of salt (sodium) a day.  Limit alcohol use to no more than 1 drink a day for nonpregnant women and 2 drinks a day for men. One drink equals 12 oz of beer, 5 oz of wine, or 1 oz of hard liquor. Lifestyle   Work with your doctor to stay at a healthy weight or to lose weight. Ask your doctor what the best weight is for you.  Get at least 30 minutes of exercise that causes your heart to beat faster (aerobic exercise) most days of the week. This may include walking, swimming, or biking.  Get at least 30 minutes of exercise that strengthens your muscles (resistance exercise) at least 3 days a week. This may include lifting weights or pilates.  Do not use any  products that contain nicotine or tobacco. This includes cigarettes and e-cigarettes. If you need help quitting, ask your doctor.  Check your blood pressure at home as told by your doctor.  Keep all follow-up visits as told by your doctor. This is important. Medicines   Take over-the-counter and prescription medicines only as told by your doctor. Follow directions carefully.  Do not skip doses of blood pressure medicine. The medicine does not work as well if you skip doses. Skipping doses also puts you at risk for problems.  Ask your doctor about side effects or reactions to medicines that you should watch for. Contact a doctor if:  You think you are having a reaction to the medicine you are taking.  You have headaches that keep coming back (recurring).  You feel dizzy.  You have swelling in your ankles.  You have trouble with your vision. Get help right away if:  You get a very bad headache.  You start to feel confused.  You feel weak or numb.  You feel faint.  You get very bad pain in your:  Chest.  Belly (abdomen).  You throw up (vomit) more than once.  You have trouble breathing. Summary  Hypertension is another name for high blood pressure.  Making healthy choices can help lower blood pressure. If your blood pressure cannot be controlled with healthy choices, you may need to take medicine. This information is not intended to replace advice given to you by your  health care provider. Make sure you discuss any questions you have with your health care provider. Document Released: 07/08/2007 Document Revised: 12/18/2015 Document Reviewed: 12/18/2015 Elsevier Interactive Patient Education  2017 [employer].

## 2016-05-11 NOTE — Progress Notes (Signed)
Subjective:    Patient ID: Kimberly Frost, female    DOB: [birth year], 66 y.o.   MRN: [id number]  HPI  Pt presents to the clinic today with c/o right foot pain. She reports this started 1-2 weeks ago. She describes the pain as sharp and stabbing. The pain does not radiate. She denies numbness or tingling in her feet. The pain is worse first thing in the morning and with weight bearing. It does not bother her when she sits still. She denies any injury to the area but does walk a lot for work. She has tried Aleve and ASA without any relief.   She also c/o watery eyes, runny nose and left ear pressure. This started 1 month ago. She denies ear pain or decreased hearing. She denies nasal congestion, sore throat or cough. She denies fever, chills or body aches. She has not tried anything OTC for this. She does not have seasonal allergies. She has not had sick contacts.  Of note, her BP today is 168/108. She reports she has not taken her blood pressure medication in months. She has been on Metoprolol in the past.  Review of Systems      Past Medical History:  Diagnosis Date  . Hepatitis   . Hypertension     No current outpatient prescriptions on file.   No current facility-administered medications for this visit.     No Known Allergies  Family History  Problem Relation Age of Onset  . Diabetes Mother   . Hypertension Mother   . Bone cancer Father   . Stroke Brother   . Diabetes Brother   . Kidney cancer Maternal Grandmother     Social History   Social History  . Marital status: Single    Spouse name: N/A  . Number of children: N/A  . Years of education: N/A   Occupational History  . Not on file.   Social History Main Topics  . Smoking status: Former Research scientist (life sciences)  . Smokeless tobacco: Never Used     Comment: 30+ years ago  . Alcohol use 3.6 oz/week    6 Cans of beer per week     Comment: occasional  . Drug use: Yes    Types: Marijuana  . Sexual activity: Not Currently     Other Topics Concern  . Not on file   Social History Narrative  . No narrative on file     Constitutional: Denies fever, malaise, fatigue, headache or abrupt weight changes.  HEENT: Pt reports ear pressure, runny nose, watery eyes. Denies eye pain, eye redness, ear pain, ringing in the ears, wax buildup, runny nose, nasal congestion, bloody nose, or sore throat. Respiratory: Denies difficulty breathing, shortness of breath, cough or sputum production.   Cardiovascular: Denies chest pain, chest tightness, palpitations or swelling in the hands or feet.  Musculoskeletal: Pt reports right foot pain. Denies decrease in range of motion, muscle pain or joint swelling.  Skin: Denies redness, rashes, lesions or ulcercations.  Neurological: Denies dizziness, difficulty with memory, difficulty with speech or problems with balance and coordination.   No other specific complaints in a complete review of systems (except as listed in HPI above).  Objective:   Physical Exam   BP (!) 168/108   Pulse 100   Temp 98.3 F (36.8 C) (Oral)   Wt 112 lb 8 oz (51 kg)   SpO2 99%   BMI 18.16 kg/m  Wt Readings from Last 3 Encounters:  05/11/16 112 lb 8  oz (51 kg)  11/15/14 109 lb (49.4 kg)  02/15/13 125 lb (56.7 kg)    General: Appears her stated age, in NAD. Skin: No bruising or erythema noted of right foot. HEENT: Head: normal shape and size, no sinus tenderness noted; Left Ear: Tm's gray and intact, normal light reflex; Right Ear: cerumen impaction; Nose: mucosa pink and moist, septum midline; Throat/Mouth: Mucosa pink and moist, no exudate, lesions or ulcerations noted.  Cardiovascular: Normal rate and rhythm.  Musculoskeletal: She limps when she walks. She is able to walk on heels but not toes. Normal flexion and extension of the right foot. Pain with rotation. Pain with palpation over the lateral 5th metatarsal.  Neurological: Alert and oriented. Sensation intact to BLE.   BMET     Component Value Date/Time   NA 140 02/15/2013 1551   K 3.7 02/15/2013 1551   CL 109 02/15/2013 1551   CO2 22 02/15/2013 1551   GLUCOSE 90 02/15/2013 1551   BUN 51 (H) 02/15/2013 1551   CREATININE 2.00 (H) 02/15/2013 1551   CALCIUM 9.2 02/15/2013 1551   GFRNONAA 35 (L) 01/27/2013 1224   GFRAA 41 (L) 01/27/2013 1224    Lipid Panel     Component Value Date/Time   CHOL 254 (H) 12/26/2008 2037   TRIG 110 12/26/2008 2037   HDL 58 12/26/2008 2037   CHOLHDL 4.4 Ratio 12/26/2008 2037   VLDL 22 12/26/2008 2037   LDLCALC 174 (H) 12/26/2008 2037    CBC    Component Value Date/Time   WBC 6.8 02/15/2013 1551   RBC 3.64 (L) 02/15/2013 1551   HGB 10.6 (L) 02/15/2013 1551   HCT 31.5 (L) 02/15/2013 1551   PLT 229 02/15/2013 1551   MCV 86.5 02/15/2013 1551   MCH 29.1 02/15/2013 1551   MCHC 33.7 02/15/2013 1551   RDW 15.1 02/15/2013 1551   LYMPHSABS 3.0 01/26/2013 1035   MONOABS 0.7 01/26/2013 1035   EOSABS 0.0 01/26/2013 1035   BASOSABS 0.0 01/26/2013 1035    Hgb A1C No results found for: HGBA1C         Assessment & Plan:   Allergic Rhinitis:  Advised her to try Claritin daily OTC.  Right Cerumen Impaction:  Manual lavage by CMA Advised her to try Debrox OTC 2 x week to prevent buildup  Right Foot Pain:  Will obtain xray of right foot today eRx for Naproxen 500 mg BID prn  HTN:  Uncontrolled due to medication noncompliance and lack of followup eRx for Benazepril-Amlodipine 10-20 mg daily  RTC in 2 weeks for follow up of HTN, will follow up with you as soon as xray's are back Webb Silversmith, NP

## 2016-05-12 ENCOUNTER — Encounter: Payer: Self-pay | Admitting: *Deleted

## 2016-06-08 ENCOUNTER — Other Ambulatory Visit: Payer: Self-pay | Admitting: Internal Medicine

## 2016-06-08 DIAGNOSIS — I10 Essential (primary) hypertension: Secondary | ICD-10-CM

## 2016-07-03 ENCOUNTER — Telehealth: Payer: Self-pay | Admitting: Internal Medicine

## 2016-07-03 NOTE — Telephone Encounter (Signed)
Pt dropped off medical certification form for [employer] 07/02/16 She stated she has been out of work from 05/11/16 to when she was laid off 06/05/16, and wanted regina to filled out paperwork  Spoke with Rollene Fare 07/03/16.  Rollene Fare stated she could not fill out paperwork because pt never followed up with her or called to say she was doing any better   Pt aware  Rollene Fare cannot due paperwork  For her being out of work  Pt stated to put the dates when regina treated her and how long she needed to be out.  The excuse letter was from 4/9 to 4/11.

## 2016-07-06 NOTE — Telephone Encounter (Signed)
Done, given back to Robin 

## 2016-07-06 NOTE — Telephone Encounter (Signed)
Paperwork in Kimberly Frost's in box for review and signature

## 2016-07-07 NOTE — Telephone Encounter (Signed)
Copy for file Copy for scan Copy for pt

## 2016-07-07 NOTE — Telephone Encounter (Signed)
Pt aware.

## 2018-02-24 ENCOUNTER — Telehealth: Payer: Self-pay

## 2018-02-24 NOTE — Telephone Encounter (Signed)
Agreed -

## 2018-02-24 NOTE — Telephone Encounter (Signed)
Pt said ankle and feet swelling on and off for 1 wk. Swelling does not go down overnight. Pt is mostly on her feet at work and pts employer sent her home today due to swelling. Pt said swelling is slightly above ankle on both legs and itching on lower leg. No redness No SOB or CP. Pt has not taken BP med in probably 2 yrs. Last refill of amlodipine benazepril 10-20 mg # 30 on 06/09/2016. Pt does not have way to ck BP at home. Pt scheduled appt 02/25/18 at 11:15.. if pt develops CP, SOB or H/A or swelling worsens prior to appt pt will go to ED. When sitting pt will keep feet up. FYI to Dr Diona Browner.

## 2018-02-25 ENCOUNTER — Telehealth: Payer: Self-pay | Admitting: Radiology

## 2018-02-25 ENCOUNTER — Telehealth: Payer: Self-pay | Admitting: Internal Medicine

## 2018-02-25 ENCOUNTER — Encounter: Payer: Self-pay | Admitting: Family Medicine

## 2018-02-25 ENCOUNTER — Ambulatory Visit (INDEPENDENT_AMBULATORY_CARE_PROVIDER_SITE_OTHER): Payer: Medicare Other | Admitting: Family Medicine

## 2018-02-25 VITALS — BP 180/106 | HR 92 | Temp 98.6°F | Ht 66.25 in | Wt 106.2 lb

## 2018-02-25 DIAGNOSIS — R600 Localized edema: Secondary | ICD-10-CM | POA: Insufficient documentation

## 2018-02-25 DIAGNOSIS — I10 Essential (primary) hypertension: Secondary | ICD-10-CM | POA: Diagnosis not present

## 2018-02-25 DIAGNOSIS — R609 Edema, unspecified: Secondary | ICD-10-CM

## 2018-02-25 LAB — LIPID PANEL
CHOL/HDL RATIO: 2
CHOLESTEROL: 154 mg/dL (ref 0–200)
HDL: 67.4 mg/dL (ref >39.00)
LDL Cholesterol: 74 mg/dL (ref 0–99)
NonHDL: 86.63
TRIGLYCERIDES: 65 mg/dL (ref 0.0–149.0)
VLDL: 13 mg/dL (ref 0.0–40.0)

## 2018-02-25 LAB — BRAIN NATRIURETIC PEPTIDE: Pro B Natriuretic peptide (BNP): 838 pg/mL — ABNORMAL HIGH (ref 0.0–100.0)

## 2018-02-25 LAB — CBC WITH DIFFERENTIAL/PLATELET
BASOS ABS: 0 10*3/uL (ref 0.0–0.1)
Basophils Relative: 0.7 % (ref 0.0–3.0)
EOS ABS: 0.1 10*3/uL (ref 0.0–0.7)
Eosinophils Relative: 2.1 % (ref 0.0–5.0)
HCT: 20.8 % — CL (ref 36.0–46.0)
Hemoglobin: 6.9 g/dL — CL (ref 12.0–15.0)
LYMPHS ABS: 2 10*3/uL (ref 0.7–4.0)
Lymphocytes Relative: 36.5 % (ref 12.0–46.0)
MCHC: 33.3 g/dL (ref 30.0–36.0)
MCV: 91.5 fl (ref 78.0–100.0)
MONO ABS: 0.4 10*3/uL (ref 0.1–1.0)
MONOS PCT: 7 % (ref 3.0–12.0)
NEUTROS ABS: 2.9 10*3/uL (ref 1.4–7.7)
NEUTROS PCT: 53.7 % (ref 43.0–77.0)
PLATELETS: 220 10*3/uL (ref 150.0–400.0)
RBC: 2.27 Mil/uL — AB (ref 3.87–5.11)
RDW: 12.6 % (ref 11.5–15.5)
WBC: 5.4 10*3/uL (ref 4.0–10.5)

## 2018-02-25 LAB — COMPREHENSIVE METABOLIC PANEL
ALK PHOS: 52 U/L (ref 39–117)
ALT: 7 U/L (ref 0–35)
AST: 16 U/L (ref 0–37)
Albumin: 3.4 g/dL — ABNORMAL LOW (ref 3.5–5.2)
BILIRUBIN TOTAL: 0.5 mg/dL (ref 0.2–1.2)
BUN: 50 mg/dL — ABNORMAL HIGH (ref 6–23)
CO2: 16 meq/L — AB (ref 19–32)
CREATININE: 4.44 mg/dL — AB (ref 0.40–1.20)
Calcium: 7.9 mg/dL — ABNORMAL LOW (ref 8.4–10.5)
Chloride: 112 mEq/L (ref 96–112)
GFR: 11.98 mL/min — AB (ref >60.00)
GLUCOSE: 94 mg/dL (ref 70–99)
Potassium: 4.4 mEq/L (ref 3.5–5.1)
Sodium: 140 mEq/L (ref 135–145)
TOTAL PROTEIN: 6.6 g/dL (ref 6.0–8.3)

## 2018-02-25 LAB — TSH: TSH: 3.5 u[IU]/mL (ref 0.35–4.50)

## 2018-02-25 MED ORDER — LISINOPRIL-HYDROCHLOROTHIAZIDE 20-25 MG PO TABS: 1.0000 | ORAL_TABLET | Freq: Every day | ORAL | 3 refills | Status: DC

## 2018-02-25 NOTE — Assessment & Plan Note (Signed)
Start back BP med.. will use HCTZ   Along with ACEI.given edema.

## 2018-02-25 NOTE — Progress Notes (Signed)
Subjective:    Patient ID: Kimberly Frost, female    DOB: March 27, 1950, 68 y.o.   MRN: [id number]  HPI 68 year old female patient of [facility]  With history of hep C and HTN presents for foot swelling.  She has not been seen in several years.  Her blood pressure is very high in office today and she is not taking her benazepril/amlodipine. BP Readings from Last 3 Encounters:  02/25/18 (!) 180/106  05/11/16 (!) 168/108  11/15/14 138/90   She has been having swelling in feet and ankles off and on for a few weeks, much worse yesterday.. worse after up on feet at end of the day. Feet painful. No CP, no SOB.  She has been feeling dizzy off and on. No fatigue.  Social History /Family History/Past Medical History reviewed in detail and updated in EMR if needed. Blood pressure (!) 180/106, pulse 92, temperature 98.6 F (37 C), temperature source Oral, height 5' 6.25" (1.683 m), weight 106 lb 4 oz (48.2 kg), SpO2 99 %.   Review of Systems  Constitutional: Negative for fatigue and fever.  HENT: Negative for congestion.   Eyes: Negative for pain.  Respiratory: Negative for cough and shortness of breath.   Cardiovascular: Positive for leg swelling. Negative for chest pain and palpitations.  Gastrointestinal: Negative for abdominal pain.  Genitourinary: Negative for dysuria and vaginal bleeding.  Musculoskeletal: Negative for back pain.  Neurological: Negative for syncope, light-headedness and headaches.  Psychiatric/Behavioral: Negative for dysphoric mood.       Objective:   Physical Exam Constitutional:      General: She is not in acute distress.    Appearance: Normal appearance. She is well-developed. She is not ill-appearing or toxic-appearing.  HENT:     Head: Normocephalic.     Right Ear: Hearing, tympanic membrane, ear canal and external ear normal. Tympanic membrane is not erythematous, retracted or bulging.     Left Ear: Hearing, tympanic membrane, ear canal and external  ear normal. Tympanic membrane is not erythematous, retracted or bulging.     Nose: No mucosal edema or rhinorrhea.     Right Sinus: No maxillary sinus tenderness or frontal sinus tenderness.     Left Sinus: No maxillary sinus tenderness or frontal sinus tenderness.     Mouth/Throat:     Pharynx: Uvula midline.  Eyes:     General: Lids are normal. Lids are everted, no foreign bodies appreciated.     Conjunctiva/sclera: Conjunctivae normal.     Pupils: Pupils are equal, round, and reactive to light.  Neck:     Musculoskeletal: Normal range of motion and neck supple.     Thyroid: No thyroid mass or thyromegaly.     Vascular: No carotid bruit.     Trachea: Trachea normal.  Cardiovascular:     Rate and Rhythm: Normal rate and regular rhythm.     Pulses: Normal pulses.     Heart sounds: Normal heart sounds, S1 normal and S2 normal. No murmur. No friction rub. No gallop.   Pulmonary:     Effort: Pulmonary effort is normal. No tachypnea or respiratory distress.     Breath sounds: Normal breath sounds. No decreased breath sounds, wheezing, rhonchi or rales.  Abdominal:     General: Bowel sounds are normal.     Palpations: Abdomen is soft.     Tenderness: There is no abdominal tenderness.  Musculoskeletal:     Right lower leg: 2+ Pitting Edema present.  Left lower leg: 2+ Pitting Edema present.  Skin:    General: Skin is warm and dry.     Findings: No rash.  Neurological:     Mental Status: She is alert.  Psychiatric:        Mood and Affect: Mood is not anxious or depressed.        Speech: Speech normal.        Behavior: Behavior normal. Behavior is cooperative.        Thought Content: Thought content normal.        Judgment: Judgment normal.           Assessment & Plan:

## 2018-02-25 NOTE — Patient Instructions (Addendum)
Avoid salt and alcohol.  Elevate feet above your heart as able in recliner.  Please stop at the lab to have labs drawn.  Start blood pressure medication.  Follow blood pressure at home.Marland Kitchen goal < 140/90

## 2018-02-25 NOTE — Telephone Encounter (Signed)
Pt was already advised to go to ER by Dr. Diona Browner. Can you call and see if she went?

## 2018-02-25 NOTE — Telephone Encounter (Signed)
Kimberly Frost notified as instructed by telephone.  She states she won't have a way to get there probably until tomorrow.  I advised that she should not wait until tomorrow.  She needs to call a family member, friend or neighbor to see if they can take her to the ER.  Patient states she will see what she can do and hung up the phone.

## 2018-02-25 NOTE — Telephone Encounter (Signed)
Does not look as if she has been seen at ED yet... let patient know she is also in renal failure and  Should not take BP med prescribed before evaluating further at ED STAT

## 2018-02-25 NOTE — Telephone Encounter (Signed)
Noted  

## 2018-02-25 NOTE — Assessment & Plan Note (Signed)
Likely due to poor BP control. Will eval with labs... elevate legs, avoid NA and ETOH. Start lisinopril HCTZ.  No chest pain.. so held off on EKG but may want to  Do EKG at follow up.

## 2018-02-25 NOTE — Telephone Encounter (Signed)
Left message for Kimberly Frost that further labs showed that she is also is in kidney failure and she should not take the blood pressure medicine that was prescribe for her today until she is evaulated at the ER.  I advised that she does need to be evaluated at the ER ASAP.

## 2018-02-25 NOTE — Telephone Encounter (Signed)
[facility] called with critical results.  GFR 11.98

## 2018-02-25 NOTE — Telephone Encounter (Signed)
Donna...call pt... she needs to go to ER for extremely low hemoglobin 6.9.Marland KitchenMarland Kitchen previously hg in 2018 10.8, nml is >12.. she will likely need a transfusion and eval for why hemoglobin is so low.

## 2018-02-25 NOTE — Telephone Encounter (Signed)
[facility] called a critical HGB - 6.9, HCT - 20.8. Results given to Dr Diona Browner

## 2018-02-25 NOTE — Telephone Encounter (Signed)
Correction.Marland Kitchen last Hg 10.6 in 2015

## 2018-02-26 ENCOUNTER — Inpatient Hospital Stay (HOSPITAL_COMMUNITY)
Admission: EM | Admit: 2018-02-26 | Discharge: 2018-02-27 | DRG: 683 | Disposition: A | Discharge status: Home / Self Care | Payer: Medicare Other | Source: Ambulatory Visit | Attending: Family Medicine | Admitting: Family Medicine

## 2018-02-26 ENCOUNTER — Other Ambulatory Visit: Payer: Self-pay

## 2018-02-26 ENCOUNTER — Encounter (HOSPITAL_COMMUNITY): Payer: Self-pay | Admitting: Internal Medicine

## 2018-02-26 ENCOUNTER — Inpatient Hospital Stay (HOSPITAL_COMMUNITY): Payer: Medicare Other

## 2018-02-26 ENCOUNTER — Emergency Department (HOSPITAL_COMMUNITY): Payer: Medicare Other

## 2018-02-26 DIAGNOSIS — N179 Acute kidney failure, unspecified: Secondary | ICD-10-CM | POA: Diagnosis not present

## 2018-02-26 DIAGNOSIS — Z823 Family history of stroke: Secondary | ICD-10-CM

## 2018-02-26 DIAGNOSIS — D631 Anemia in chronic kidney disease: Secondary | ICD-10-CM | POA: Diagnosis present

## 2018-02-26 DIAGNOSIS — Z23 Encounter for immunization: Secondary | ICD-10-CM | POA: Diagnosis not present

## 2018-02-26 DIAGNOSIS — B1920 Unspecified viral hepatitis C without hepatic coma: Secondary | ICD-10-CM | POA: Diagnosis present

## 2018-02-26 DIAGNOSIS — Z87891 Personal history of nicotine dependence: Secondary | ICD-10-CM | POA: Diagnosis not present

## 2018-02-26 DIAGNOSIS — Z833 Family history of diabetes mellitus: Secondary | ICD-10-CM | POA: Diagnosis not present

## 2018-02-26 DIAGNOSIS — B182 Chronic viral hepatitis C: Secondary | ICD-10-CM | POA: Diagnosis present

## 2018-02-26 DIAGNOSIS — I10 Essential (primary) hypertension: Secondary | ICD-10-CM | POA: Diagnosis present

## 2018-02-26 DIAGNOSIS — Z9119 Patient's noncompliance with other medical treatment and regimen: Secondary | ICD-10-CM

## 2018-02-26 DIAGNOSIS — Z8249 Family history of ischemic heart disease and other diseases of the circulatory system: Secondary | ICD-10-CM | POA: Diagnosis not present

## 2018-02-26 DIAGNOSIS — I120 Hypertensive chronic kidney disease with stage 5 chronic kidney disease or end stage renal disease: Secondary | ICD-10-CM | POA: Diagnosis not present

## 2018-02-26 DIAGNOSIS — R600 Localized edema: Secondary | ICD-10-CM | POA: Diagnosis present

## 2018-02-26 DIAGNOSIS — N185 Chronic kidney disease, stage 5: Secondary | ICD-10-CM | POA: Diagnosis present

## 2018-02-26 DIAGNOSIS — Z8051 Family history of malignant neoplasm of kidney: Secondary | ICD-10-CM

## 2018-02-26 DIAGNOSIS — D649 Anemia, unspecified: Secondary | ICD-10-CM | POA: Diagnosis present

## 2018-02-26 DIAGNOSIS — R609 Edema, unspecified: Secondary | ICD-10-CM | POA: Diagnosis present

## 2018-02-26 LAB — CBC WITH DIFFERENTIAL/PLATELET
Abs Immature Granulocytes: 0.02 10*3/uL (ref 0.00–0.07)
Basophils Absolute: 0 10*3/uL (ref 0.0–0.1)
Basophils Relative: 1 %
EOS PCT: 3 %
Eosinophils Absolute: 0.2 10*3/uL (ref 0.0–0.5)
HEMATOCRIT: 22.1 % — AB (ref 36.0–46.0)
HEMOGLOBIN: 6.8 g/dL — AB (ref 12.0–15.0)
Immature Granulocytes: 0 %
LYMPHS PCT: 45 %
Lymphs Abs: 2.3 10*3/uL (ref 0.7–4.0)
MCH: 28.9 pg (ref 26.0–34.0)
MCHC: 30.8 g/dL (ref 30.0–36.0)
MCV: 94 fL (ref 80.0–100.0)
MONO ABS: 0.3 10*3/uL (ref 0.1–1.0)
Monocytes Relative: 6 %
Neutro Abs: 2.3 10*3/uL (ref 1.7–7.7)
Neutrophils Relative %: 45 %
Platelets: 213 10*3/uL (ref 150–400)
RBC: 2.35 MIL/uL — ABNORMAL LOW (ref 3.87–5.11)
RDW: 12.4 % (ref 11.5–15.5)
WBC: 5.2 10*3/uL (ref 4.0–10.5)
nRBC: 0 % (ref 0.0–0.2)

## 2018-02-26 LAB — COMPREHENSIVE METABOLIC PANEL
ALBUMIN: 3.3 g/dL — AB (ref 3.5–5.0)
ALK PHOS: 47 U/L (ref 38–126)
ALT: 9 U/L (ref 0–44)
ANION GAP: 12 (ref 5–15)
AST: 19 U/L (ref 15–41)
BILIRUBIN TOTAL: 0.7 mg/dL (ref 0.3–1.2)
BUN: 47 mg/dL — ABNORMAL HIGH (ref 8–23)
CALCIUM: 7.8 mg/dL — AB (ref 8.9–10.3)
CO2: 14 mmol/L — ABNORMAL LOW (ref 22–32)
CREATININE: 4.62 mg/dL — AB (ref 0.44–1.00)
Chloride: 112 mmol/L — ABNORMAL HIGH (ref 98–111)
GFR calc Af Amer: 11 mL/min — ABNORMAL LOW (ref >60)
GFR calc non Af Amer: 9 mL/min — ABNORMAL LOW (ref >60)
GLUCOSE: 105 mg/dL — AB (ref 70–99)
Potassium: 3.9 mmol/L (ref 3.5–5.1)
Sodium: 138 mmol/L (ref 135–145)
TOTAL PROTEIN: 6.4 g/dL — AB (ref 6.5–8.1)

## 2018-02-26 LAB — URIC ACID: Uric Acid, Serum: 8.3 mg/dL — ABNORMAL HIGH (ref 2.5–7.1)

## 2018-02-26 LAB — ABO/RH: ABO/RH(D): A POS

## 2018-02-26 LAB — URINALYSIS, ROUTINE W REFLEX MICROSCOPIC
Bilirubin Urine: NEGATIVE
Glucose, UA: NEGATIVE mg/dL
KETONES UR: NEGATIVE mg/dL
Nitrite: NEGATIVE
Protein, ur: 30 mg/dL — AB
Specific Gravity, Urine: 1.011 (ref 1.005–1.030)
WBC, UA: >50 WBC/hpf — ABNORMAL HIGH (ref 0–5)
pH: 6 (ref 5.0–8.0)

## 2018-02-26 LAB — IRON AND TIBC
IRON: 59 ug/dL (ref 28–170)
SATURATION RATIOS: 29 % (ref 10.4–31.8)
TIBC: 200 ug/dL — AB (ref 250–450)
UIBC: 141 ug/dL

## 2018-02-26 LAB — FERRITIN: FERRITIN: 122 ng/mL (ref 11–307)

## 2018-02-26 LAB — POC OCCULT BLOOD, ED: Fecal Occult Bld: NEGATIVE

## 2018-02-26 LAB — PREPARE RBC (CROSSMATCH)

## 2018-02-26 MED ORDER — AMLODIPINE BESYLATE 5 MG PO TABS
5.0000 mg | ORAL_TABLET | Freq: Every day | ORAL | Status: DC
  Administered 2018-02-26: 5 mg via ORAL
  Filled 2018-02-26: qty 1

## 2018-02-26 MED ORDER — LABETALOL HCL 300 MG PO TABS
300.0000 mg | ORAL_TABLET | Freq: Two times a day (BID) | ORAL | Status: DC
  Administered 2018-02-27: 300 mg via ORAL
  Filled 2018-02-26: qty 1

## 2018-02-26 MED ORDER — SODIUM CHLORIDE 0.9% IV SOLUTION
Freq: Once | INTRAVENOUS | Status: AC
  Administered 2018-02-26: 13:00:00 via INTRAVENOUS

## 2018-02-26 MED ORDER — HYDRALAZINE HCL 20 MG/ML IJ SOLN: 10.0000 mg | INTRAMUSCULAR | Status: DC | PRN

## 2018-02-26 MED ORDER — FUROSEMIDE 10 MG/ML IJ SOLN
40.0000 mg | Freq: Two times a day (BID) | INTRAMUSCULAR | Status: DC
  Administered 2018-02-26 – 2018-02-27 (×2): 40 mg via INTRAVENOUS
  Filled 2018-02-26 (×2): qty 4

## 2018-02-26 MED ORDER — LABETALOL HCL 5 MG/ML IV SOLN
20.0000 mg | Freq: Once | INTRAVENOUS | Status: AC
  Administered 2018-02-26: 20 mg via INTRAVENOUS
  Filled 2018-02-26: qty 4

## 2018-02-26 MED ORDER — HEPARIN SODIUM (PORCINE) 5000 UNIT/ML IJ SOLN
5000.0000 [IU] | Freq: Three times a day (TID) | INTRAMUSCULAR | Status: DC
  Administered 2018-02-26 – 2018-02-27 (×3): 5000 [IU] via SUBCUTANEOUS
  Filled 2018-02-26 (×3): qty 1

## 2018-02-26 MED ORDER — LABETALOL HCL 100 MG PO TABS
100.0000 mg | ORAL_TABLET | Freq: Three times a day (TID) | ORAL | Status: DC
  Administered 2018-02-26 (×2): 100 mg via ORAL
  Filled 2018-02-26 (×2): qty 1

## 2018-02-26 MED ORDER — AMLODIPINE BESYLATE 10 MG PO TABS
10.0000 mg | ORAL_TABLET | Freq: Every day | ORAL | Status: DC
  Administered 2018-02-27: 10 mg via ORAL
  Filled 2018-02-26: qty 1

## 2018-02-26 MED ORDER — ACETAMINOPHEN 325 MG PO TABS
650.0000 mg | ORAL_TABLET | Freq: Four times a day (QID) | ORAL | Status: DC | PRN
  Administered 2018-02-26: 650 mg via ORAL
  Filled 2018-02-26: qty 2

## 2018-02-26 NOTE — H&P (Signed)
History and Physical    ARLY SALMINEN HKV:[id number] DOB: 1950/11/14 DOA: 02/26/2018  PCP: [name], NP  Patient coming from: home   I have personally briefly reviewed patient's old medical records in [place] and Care everywhere.   Chief Complaint: leg swelling, sent by PCP after routine blood test   HPI: OMAH DEWALT is a 68 y.o. female with medical history significant of chronically untreated hypertension, noncompliance, hepatitis C untreated, history of chronic kidney disease with progressive deterioration who presents to the emergency room on request of her primary care physician who did a routine blood test and found to have significantly elevated creatinine and hemoglobin less than 7.  According to the patient, her only problem is swelling of both legs and it is troublesome when she is standing on her work all day.  She has no loss of energy.  She did not have any trouble so she did not go to see her doctor last 2 years.  Patient has known history of hypertension, however she was not on treatment regularly.  Patient developed bilateral leg swelling since about a week so she went to see her primary care physician yesterday.  She was found to have blood pressure more than 180.  Patient was prescribed amlodipine lisinopril and hydrochlorothiazide that she has not taken yet.  Routine blood test showed hemoglobin of 6.9, hematocrit 20.8, creatinine of 4.4 with a bicarb of 16.  She was called to come to the ER. Patient denies any headache, nausea, vomiting.  She denies any appetite loss.  She has normal eating patterns.  She is working actively.  Denies any hemoptysis, hematemesis, hematochezia.  She is urinating as usual.  Denies any problems urinating. ED Course: Patient is hypertensive in the emergency room, her blood pressures are 227/112, she was given 20 mg of labetalol.  Repeat hemoglobin is 6.8.  Her creatinine is 4.6.  Patient is on room air.  She was started on 2 units of PRBC  transfusion after consenting.  Admission requested because of multiple medical issues.  Review of Systems: As per HPI otherwise 10 point review of systems negative.    Past Medical History:  Diagnosis Date  . Hepatitis   . Hypertension     History reviewed. No pertinent surgical history.   reports that she has quit smoking. She has never used smokeless tobacco. She reports current alcohol use of about 6.0 standard drinks of alcohol per week. She reports current drug use. Drug: Marijuana.  No Known Allergies  Family History  Problem Relation Age of Onset  . Diabetes Mother   . Hypertension Mother   . Bone cancer Father   . Kidney cancer Maternal Grandmother   . Stroke Brother   . Diabetes Brother      Prior to Admission medications   Not on File    Physical Exam: Vitals:   02/26/18 0916 02/26/18 0919 02/26/18 1015 02/26/18 1030  BP:  (!) 227/112 (!) 218/101 (!) 194/102  Pulse:  (!) 102 81 78  Resp:  16 14 17   Temp:  97.9 F (36.6 C)    TempSrc:  Oral    SpO2:  100% 100% 100%  Weight: 48.2 kg     Height: 5\' 6"  (1.676 m)       Constitutional: NAD, calm, comfortable Vitals:   02/26/18 0916 02/26/18 0919 02/26/18 1015 02/26/18 1030  BP:  (!) 227/112 (!) 218/101 (!) 194/102  Pulse:  (!) 102 81 78  Resp:  16 14  17  Temp:  97.9 F (36.6 C)    TempSrc:  Oral    SpO2:  100% 100% 100%  Weight: 48.2 kg     Height: 5\' 6"  (1.676 m)      Eyes: PERRL, lids and conjunctivae normal Puffy face.  No acute findings. ENMT: Mucous membranes are moist. Posterior pharynx clear of any exudate or lesions.Normal dentition.  Neck: normal, supple, no masses, no thyromegaly Respiratory: clear to auscultation bilaterally, no wheezing, no crackles. Normal respiratory effort. No accessory muscle use.  Cardiovascular: Regular rate and rhythm, no murmurs / rubs / gallops.  2+ pedal pulses. No carotid bruits.  Abdomen: no tenderness, no masses palpated. No hepatosplenomegaly. Bowel sounds  positive.  Musculoskeletal: no clubbing / cyanosis. No joint deformity upper and lower extremities. Good ROM, no contractures. Normal muscle tone.  Skin: no rashes, lesions, ulcers. No induration Neurologic: CN 2-12 grossly intact. Sensation intact, DTR normal. Strength 5/5 in all 4.  Psychiatric: Normal judgment and insight. Alert and oriented x 3. Normal mood.   Poorly kempt extremities.  1+ bilateral edema, right more than left.  Homans sign negative.  Patient has scratch marks all over the legs.   Labs on Admission: I have personally reviewed following labs and imaging studies  CBC: Recent Labs  Lab 02/25/18 1233 02/26/18 0930  WBC 5.4 5.2  NEUTROABS 2.9 2.3  HGB 6.9 Repeated and verified X2.* 6.8*  HCT 20.8 Repeated and verified X2.* 22.1*  MCV 91.5 94.0  PLT 220.0 409   Basic Metabolic Panel: Recent Labs  Lab 02/25/18 1233 02/26/18 0930  NA 140 138  K 4.4 3.9  CL 112 112*  CO2 16* 14*  GLUCOSE 94 105*  BUN 50* 47*  CREATININE 4.44* 4.62*  CALCIUM 7.9* 7.8*   GFR: Estimated Creatinine Clearance: 9.1 mL/min (A) (by C-G formula based on SCr of 4.62 mg/dL (H)). Liver Function Tests: Recent Labs  Lab 02/25/18 1233 02/26/18 0930  AST 16 19  ALT 7 9  ALKPHOS 52 47  BILITOT 0.5 0.7  PROT 6.6 6.4*  ALBUMIN 3.4* 3.3*   No results for input(s): LIPASE, AMYLASE in the last 168 hours. No results for input(s): AMMONIA in the last 168 hours. Coagulation Profile: No results for input(s): INR, PROTIME in the last 168 hours. Cardiac Enzymes: No results for input(s): CKTOTAL, CKMB, CKMBINDEX, TROPONINI in the last 168 hours. BNP (last 3 results) Recent Labs    02/25/18 1233  PROBNP 838.0*   HbA1C: No results for input(s): HGBA1C in the last 72 hours. CBG: No results for input(s): GLUCAP in the last 168 hours. Lipid Profile: Recent Labs    02/25/18 1233  CHOL 154  HDL 67.40  LDLCALC 74  TRIG 65.0  CHOLHDL 2   Thyroid Function Tests: Recent Labs     02/25/18 1233  TSH 3.50   Anemia Panel: No results for input(s): VITAMINB12, FOLATE, FERRITIN, TIBC, IRON, RETICCTPCT in the last 72 hours. Urine analysis:    Component Value Date/Time   COLORURINE YELLOW 01/27/2013 1605   APPEARANCEUR HAZY (A) 01/27/2013 1605   LABSPEC 1.011 01/27/2013 1605   PHURINE 6.5 01/27/2013 1605   GLUCOSEU NEGATIVE 01/27/2013 1605   HGBUR NEGATIVE 01/27/2013 1605   BILIRUBINUR NEGATIVE 01/27/2013 1605   KETONESUR NEGATIVE 01/27/2013 1605   PROTEINUR NEGATIVE 01/27/2013 1605   UROBILINOGEN 0.2 01/27/2013 1605   NITRITE NEGATIVE 01/27/2013 1605   LEUKOCYTESUR TRACE (A) 01/27/2013 1605    Radiological Exams on Admission: Dg Chest 2 View  Result Date: 02/26/2018 CLINICAL DATA:  Chest pain. Lower extremity swelling for 1 week. Low hemoglobin. EXAM: CHEST - 2 VIEW COMPARISON:  01/26/2013 FINDINGS: The heart size and mediastinal contours are within normal limits. Aortic atherosclerosis. Both lungs are clear. The visualized skeletal structures are unremarkable. IMPRESSION: Stable exam.  No active cardiopulmonary disease. Electronically Signed   By: Earle Gell M.D.   On: 02/26/2018 10:00     Assessment/Plan Principal Problem:   Anemia due to stage 5 chronic kidney disease (HCC) Active Problems:   Essential hypertension, benign   Hepatitis C   Peripheral edema   Anemia   Severe symptomatic anemia due to anemia of chronic kidney disease: Agree with admission.  Transfuse 2 units of PRBC and recheck levels.  No evidence of active bleeding.  FOBT negative. Will check iron panel.  She will need very close nephrology follow-up.  I consulted nephrology in the hospital.  May benefit with iron supplementation depending upon levels.  Stage V chronic kidney disease due to hypertension: Probably progressive kidney disease untreated.  Currently no evidence of uremia, potassium is normal.  No indication for urgent dialysis.  Patient will need work-up and long-term  nephrology follow-up.  I called and discussed case with nephrologist.  They will see patient in consultation. Will check iron panels, urinalysis, renal ultrasound, uric acid levels.  Hypertension: Accelerated due to noncompliance.  Hold all diuretics and ACE inhibitors.  Will start patient on amlodipine and labetalol.  Keep on hydralazine as needed to control the blood pressure.  This will need further up titration.  Chronic hepatitis C: Unknown status.  Liver functions are normal.    DVT prophylaxis: Subcu heparin.  Anemia is not due to bleeding. Code Status: Full code. Family Communication: Husband at the bedside. Disposition Plan: Home. Consults called: Nephrology. Admission status: Inpatient cardiac telemetry.   Barb Merino MD [facility] Pager [phone]  If 7PM-7AM, please contact night-coverage [email or web address] Password [facility]  02/26/2018, 11:36 AM

## 2018-02-26 NOTE — Consult Note (Signed)
Renal Service Consult Note [facility]  NALAYA WOJDYLA 02/26/2018 Sol [place]azing Requesting Physician:  Dr Sloan Leiter, K.   Reason for Consult:  Renal failure HPI: The patient is a 68 y.o. year-old with hx of chronically untreated HTN, noncompliance, hep C, CKD who presented to ED after labs drawn by PCP showed worsening renal failure and Hb 7.  BP's very high at PCP and in ED.  Hasn't seen MD in 2 years.  BP's better after norvasc and IV labetalol.  Asked to see for renal failure.   Old chart shows creat in 2014-15 was around 1.6- 2.0.   Old and present UA's don't show any sig proteinuria.  +pyuria today. US renal today shows chronic changes, 8.5- 9.3 cm kidneys no hydro.  Hb here is 6.8, wbc and plts ok.   Pt lives w/ her fiance, never married no kids.  Occ etoh, no tobacco. 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   Pt states only has had HTN since 2018, then she tooks meds and when her BP got better she stopped the meds. "I shouldn't have done that". No c/o other than leg edema, no SOB/ orthopnea, fatigue or anorexia.      ROS  denies CP  no joint pain   no HA  no blurry vision  no rash  no diarrhea  no nausea/ vomiting  no dysuria  no difficulty voiding  no change in urine color    Past Medical History  Past Medical History:  Diagnosis Date  . Hepatitis   . Hypertension    Past Surgical History History reviewed. No pertinent surgical history. Family History  Family History  Problem Relation Age of Onset  . Diabetes Mother   . Hypertension Mother   . Bone cancer Father   . Kidney cancer Maternal Grandmother   . Stroke Brother   . Diabetes Brother    Social History  reports that she has quit smoking. She has never used smokeless tobacco. She reports current alcohol use of about 6.0 standard drinks of alcohol per week. She reports current drug use. Drug: Marijuana. Allergies No Known Allergies Home medications Prior  to Admission medications   Not on File   Liver Function Tests Recent Labs  Lab 02/25/18 1233 02/26/18 0930  AST 16 19  ALT 7 9  ALKPHOS 52 47  BILITOT 0.5 0.7  PROT 6.6 6.4*  ALBUMIN 3.4* 3.3*   No results for input(s): LIPASE, AMYLASE in the last 168 hours. CBC Recent Labs  Lab 02/25/18 1233 02/26/18 0930  WBC 5.4 5.2  NEUTROABS 2.9 2.3  HGB 6.9 Repeated and verified X2.* 6.8*  HCT 20.8 Repeated and verified X2.* 22.1*  MCV 91.5 94.0  PLT 220.0 975   Basic Metabolic Panel Recent Labs  Lab 02/25/18 1233 02/26/18 0930  NA 140 138  K 4.4 3.9  CL 112 112*  CO2 16* 14*  GLUCOSE 94 105*  BUN 50* 47*  CREATININE 4.44* 4.62*  CALCIUM 7.9* 7.8*   Iron/TIBC/Ferritin/ %Sat    Component Value Date/Time   IRON 59 02/26/2018 1155   TIBC 200 (L) 02/26/2018 1155   FERRITIN 122 02/26/2018 1155   IRONPCTSAT 29 02/26/2018 1155    Vitals:   02/26/18 1559 02/26/18 1600 02/26/18 1624 02/26/18 1704  BP: 138/78 (!) 154/90 (!) 186/99 (!) 169/90  Pulse: 87 85 91 85  Resp: 18 16 20 17   Temp: 98.9 F (37.2 C)  98 F (  36.7 C) 98.5 F (36.9 C)  TempSrc: Oral  Oral Oral  SpO2:  100% 100% 100%  Weight:   47 kg   Height:   5' 6"  (1.676 m)    Exam Gen thin AAF no distress No rash, cyanosis or gangrene Sclera anicteric, throat clear  No jvd or bruits Chest faint crackle R base, L clear, good air movement RRR no MRG Abd soft ntnd no mass or ascites +bs no bruits GU defer MS no joint effusions or deformity Ext 1-2+ bilat pretib edema, no wounds or ulcers Neuro is alert, Ox 3 , nf, no asterixis    Home meds:  - none    Na 138 K 3.9 CO2 14  aG 12  BUN 47  Cr 4.62  Ca 7.8  Alb 3.3  Uric acid 8.3  LFT's ok.  eGFR 12 cc/min  Hb 6.8  WBC 5.2  plt 213  tsh 3.5  UA 1.011  Many bact 0-5 rbc/epi, >50 wbc, prot 30  FOB negative    CXR no active disease   Renal US 8-9 cm kidneys, chronic changes, no hydro, +cysts simple      Assessment: 1. Renal failure - bland UA,  chronic changes on [place], this is c/w progressive CKD likely due to poorly controlled HTN.  Not f/b any nephrologist. Will give lasix IV short-term for the vol excess and then will need po lasix. Not uremic now but should have access placed. Vein mapping ordered.  2. HTN noncompliant w/ meds, poorly controlled. Will ^ norvasc/ labetalol.  3. Vol excess - not severe, LE edema 4. Anemia - prob from CKD 5. MBD ckd - check phos, Ca low which is expected, check pth    P: 1. As above.        Kelly Splinter MD [employer] pager [phone]   02/26/2018, 6:45 PM

## 2018-02-26 NOTE — ED Provider Notes (Signed)
[place] [facility] EMERGENCY DEPARTMENT Provider Note   CSN: [id number] Arrival date & time: 02/26/18  1324     History   Chief Complaint Chief Complaint  Patient presents with  . Abnormal Lab    HPI Kimberly Frost is a 68 y.o. female.  HPI   Patient is a 68 year old female with a history of hepatitis and hep C who presents the emergency department today for evaluation after she was told by her PCP that she had abnormal labs.  Patient was seen by her PCP yesterday complaining of pedal edema.  In the office was noted to be hypertensive and was started on hypertension medication.  She had labs drawn at that time including CBC, CMP, BNP and TSH.  Hemoglobin was noted to be low at 6.9.  CMP also noted to have elevated creatinine at 4.44.  BNP was also elevated in the 800s.  TSH was normal.  On evaluation today patient is complaining of pedal edema.  She states that her legs swell when she is at work standing on her feet for long periods of time.  States the swelling improves somewhat when she goes home.  She denies any chest pain, shortness of breath, cough congestion, abdominal pain.  No bloody or tarry stools.  No vaginal bleeding or gum bleeding.  She is not anticoagulated.  States she has been having intermittent headaches but does not have one currently.  No other neuro complaints.  States that she had an episode where she felt near syncopal last week.  Since then she has had no feelings of dizziness or lightheadedness.  Past Medical History:  Diagnosis Date  . Hepatitis   . Hypertension     Patient Active Problem List   Diagnosis Date Noted  . Anemia due to stage 5 chronic kidney disease ([place]) 02/26/2018  . Anemia 02/26/2018  . Peripheral edema 02/25/2018  . Hepatitis C 02/15/2013  . Cannabis abuse 12/26/2008  . Essential hypertension, benign 12/26/2008    History reviewed. No pertinent surgical history.   OB History   No obstetric history on file.       Home Medications    Prior to Admission medications   Not on File    Family History Family History  Problem Relation Age of Onset  . Diabetes Mother   . Hypertension Mother   . Bone cancer Father   . Kidney cancer Maternal Grandmother   . Stroke Brother   . Diabetes Brother     Social History Social History   Tobacco Use  . Smoking status: Former Research scientist (life sciences)  . Smokeless tobacco: Never Used  . Tobacco comment: 30+ years ago  Substance Use Topics  . Alcohol use: Yes    Alcohol/week: 6.0 standard drinks    Types: 6 Cans of beer per week    Comment: occasional  . Drug use: Yes    Types: Marijuana     Allergies   Patient has no known allergies.   Review of Systems Review of Systems  Constitutional: Negative for fever.  HENT: Negative for nosebleeds.        No gum bleeding  Eyes: Negative for visual disturbance.  Respiratory: Negative for cough and shortness of breath.   Cardiovascular: Positive for leg swelling. Negative for chest pain.  Gastrointestinal: Negative for abdominal pain, blood in stool, constipation, diarrhea, nausea and vomiting.  Genitourinary: Negative for dysuria, hematuria and vaginal bleeding.  Musculoskeletal: Negative for arthralgias and back pain.  Skin: Negative for color change  and rash.  Neurological: Negative for dizziness, weakness, light-headedness, numbness and headaches.       Near syncope (resolved)  All other systems reviewed and are negative.   Physical Exam Updated Vital Signs BP (!) 209/105   Pulse 83   Temp 97.9 F (36.6 C) (Oral)   Resp 18   Ht 5\' 6"  (1.676 m)   Wt 48.2 kg   SpO2 100%   BMI 17.15 kg/m   Physical Exam Vitals signs and nursing note reviewed.  Constitutional:      General: She is not in acute distress.    Appearance: She is well-developed. She is not ill-appearing or toxic-appearing.  HENT:     Head: Normocephalic and atraumatic.     Mouth/Throat:     Mouth: Mucous membranes are moist.  Eyes:      Comments: Pale conjunctiva  Neck:     Musculoskeletal: Neck supple.  Cardiovascular:     Rate and Rhythm: Normal rate and regular rhythm.     Heart sounds: Normal heart sounds. No murmur.  Pulmonary:     Effort: Pulmonary effort is normal. No respiratory distress.     Breath sounds: Normal breath sounds. No stridor. No wheezing or rhonchi.  Abdominal:     General: Bowel sounds are normal.     Palpations: Abdomen is soft.     Tenderness: There is no abdominal tenderness.  Genitourinary:    Comments: Chaperone present for DRE. Soft brown stool noted in vault. No gross blood or melena. Musculoskeletal:        General: Tenderness present.     Right lower leg: Edema (2+) present.     Left lower leg: Edema (2+) present.     Comments: DP pulses intact bilat  Skin:    General: Skin is warm and dry.  Neurological:     General: No focal deficit present.     Mental Status: She is alert.     Coordination: Coordination normal.  Psychiatric:        Mood and Affect: Mood normal.      ED Treatments / Results  Labs (all labs ordered are listed, but only abnormal results are displayed) Labs Reviewed  CBC WITH DIFFERENTIAL/PLATELET - Abnormal; Notable for the following components:      Result Value   RBC 2.35 (*)    Hemoglobin 6.8 (*)    HCT 22.1 (*)    All other components within normal limits  COMPREHENSIVE METABOLIC PANEL - Abnormal; Notable for the following components:   Chloride 112 (*)    CO2 14 (*)    Glucose, Bld 105 (*)    BUN 47 (*)    Creatinine, Ser 4.62 (*)    Calcium 7.8 (*)    Total Protein 6.4 (*)    Albumin 3.3 (*)    GFR calc non Af Amer 9 (*)    GFR calc Af Amer 11 (*)    All other components within normal limits  URIC ACID - Abnormal; Notable for the following components:   Uric Acid, Serum 8.3 (*)    All other components within normal limits  URINALYSIS, ROUTINE W REFLEX MICROSCOPIC - Abnormal; Notable for the following components:   APPearance HAZY  (*)    Hgb urine dipstick SMALL (*)    Protein, ur 30 (*)    Leukocytes, UA LARGE (*)    WBC, UA >50 (*)    Bacteria, UA MANY (*)    All other components within normal limits  IRON AND TIBC  FERRITIN  HIV ANTIBODY (ROUTINE TESTING W REFLEX)  POC OCCULT BLOOD, ED  TYPE AND SCREEN  PREPARE RBC (CROSSMATCH)  ABO/RH    EKG None  Radiology Dg Chest 2 View  Result Date: 02/26/2018 CLINICAL DATA:  Chest pain. Lower extremity swelling for 1 week. Low hemoglobin. EXAM: CHEST - 2 VIEW COMPARISON:  01/26/2013 FINDINGS: The heart size and mediastinal contours are within normal limits. Aortic atherosclerosis. Both lungs are clear. The visualized skeletal structures are unremarkable. IMPRESSION: Stable exam.  No active cardiopulmonary disease. Electronically Signed   By: Earle Gell M.D.   On: 02/26/2018 10:00   US Renal  Result Date: 02/26/2018 CLINICAL DATA:  Chronic kidney disease.  Hypertension EXAM: RENAL / URINARY TRACT ULTRASOUND COMPLETE COMPARISON:  None. FINDINGS: Right Kidney: Renal measurements: 9.4 x 3.7 x 4.8 cm = volume: 88 mL. Increased parenchymal echogenicity. No hydronephrosis. Several cysts noted within the upper pole of right kidney. These appear anechoic with increased through transmission. The largest measures 1.8 x 1.3 x 2.2 cm. Left Kidney: Renal measurements: 8.9 x 4.5 x 5.0 cm = volume: 105.4 mL. Increased parenchymal echogenicity. No hydronephrosis.Mild perinephric fluid identified. Bladder: Appears normal for degree of bladder distention. IMPRESSION: 1. No hydronephrosis identified bilaterally. 2. Bilateral echogenic kidneys compatible with chronic medical renal disease. A small amount of perinephric fluid is identified on the left. 3. Right kidney cysts. Electronically Signed   By: Kerby Moors M.D.   On: 02/26/2018 12:38    Procedures Procedures (including critical care time) CRITICAL CARE Performed by: Rodney Booze   Total critical care time: 35  minutes  Critical care time was exclusive of separately billable procedures and treating other patients.  Critical care was necessary to treat or prevent imminent or life-threatening deterioration.  Critical care was time spent personally by me on the following activities: development of treatment plan with patient and/or surrogate as well as nursing, discussions with consultants, evaluation of patient's response to treatment, examination of patient, obtaining history from patient or surrogate, ordering and performing treatments and interventions, ordering and review of laboratory studies, ordering and review of radiographic studies, pulse oximetry and re-evaluation of patient's condition.   Medications Ordered in ED Medications  amLODipine (NORVASC) tablet 5 mg (has no administration in time range)  labetalol (NORMODYNE) tablet 100 mg (has no administration in time range)  hydrALAZINE (APRESOLINE) injection 10 mg (has no administration in time range)  heparin injection 5,000 Units (has no administration in time range)  acetaminophen (TYLENOL) tablet 650 mg (has no administration in time range)  0.9 %  sodium chloride infusion (Manually program via Guardrails IV Fluids) ( Intravenous New Bag/Given 02/26/18 1242)  labetalol (NORMODYNE,TRANDATE) injection 20 mg (20 mg Intravenous Given 02/26/18 1025)     Initial Impression / Assessment and Plan / ED Course  I have reviewed the triage vital signs and the nursing notes.  Pertinent labs & imaging results that were available during my care of the patient were reviewed by me and considered in my medical decision making (see chart for details).      Final Clinical Impressions(s) / ED Diagnoses   Final diagnoses:  Symptomatic anemia  Acute renal failure, unspecified acute renal failure type ([place])  Hypertension, unspecified type   Patient presenting with complaints of abnormal laboratory work from PCP.  Was noted to have low hemoglobin and  elevated creatinine.  Has been off of BP medications for several years.  Denies any bloody or tarry stools.  No vaginal bleeding or hematuria.  Is relatively asymptomatic at this time.  Only complaints of bilateral lower extremity edema and episode of near syncope 1 week ago.   BP elevated, labetalol given.  Patient without chest pain or shortness of breath. No neuro complaints.    CBC with hemoglobin at 6.8.   CMP with elevated BUN and creatinine, worsening creatinine from yesterday.  Was 4.4 now 4.6.  Concern obtained.  2 units PRBCs ordered.Chest x-ray without evidence of pulmonary edema or other abnormality.  Hemoccult negative  On re-eval, BP remains somewhat elevated however has improved.  She is not tachycardic.  She continues with no significant complaints other than lower extremity edema. She will require admission for anemia and renal failure.  Discussed case with Dr. Sloan Leiter with hospitalist service who accepts patient for admission.   ED Discharge Orders    None       Rodney Booze, PA-C 02/26/18 [place], DO 02/26/18 1438

## 2018-02-26 NOTE — ED Triage Notes (Signed)
Pt coming in sent from pcp for low hgb and abnormal kidney functions ; pt denies any chest pain/ sob/ dizziness or weakness; pt denies any blood in stool ; pt c/o swelling to bilateral legs x 1 week ; pt alert and oriented x4

## 2018-02-27 ENCOUNTER — Encounter (HOSPITAL_COMMUNITY): Payer: Medicare Other

## 2018-02-27 ENCOUNTER — Other Ambulatory Visit: Payer: Self-pay

## 2018-02-27 ENCOUNTER — Encounter (HOSPITAL_COMMUNITY): Payer: Self-pay | Admitting: *Deleted

## 2018-02-27 DIAGNOSIS — N185 Chronic kidney disease, stage 5: Secondary | ICD-10-CM | POA: Diagnosis present

## 2018-02-27 DIAGNOSIS — N179 Acute kidney failure, unspecified: Secondary | ICD-10-CM | POA: Diagnosis not present

## 2018-02-27 DIAGNOSIS — Z23 Encounter for immunization: Secondary | ICD-10-CM | POA: Diagnosis not present

## 2018-02-27 DIAGNOSIS — D631 Anemia in chronic kidney disease: Secondary | ICD-10-CM | POA: Diagnosis not present

## 2018-02-27 LAB — BASIC METABOLIC PANEL
Anion gap: 11 (ref 5–15)
BUN: 46 mg/dL — AB (ref 8–23)
CO2: 14 mmol/L — ABNORMAL LOW (ref 22–32)
Calcium: 7.7 mg/dL — ABNORMAL LOW (ref 8.9–10.3)
Chloride: 114 mmol/L — ABNORMAL HIGH (ref 98–111)
Creatinine, Ser: 4.71 mg/dL — ABNORMAL HIGH (ref 0.44–1.00)
GFR calc Af Amer: 10 mL/min — ABNORMAL LOW (ref >60)
GFR calc non Af Amer: 9 mL/min — ABNORMAL LOW (ref >60)
Glucose, Bld: 89 mg/dL (ref 70–99)
Potassium: 3.5 mmol/L (ref 3.5–5.1)
Sodium: 139 mmol/L (ref 135–145)

## 2018-02-27 LAB — HIV ANTIBODY (ROUTINE TESTING W REFLEX): HIV Screen 4th Generation wRfx: NONREACTIVE

## 2018-02-27 LAB — TYPE AND SCREEN
ABO/RH(D): A POS
ANTIBODY SCREEN: NEGATIVE
Unit division: 0
Unit division: 0

## 2018-02-27 LAB — BPAM RBC
BLOOD PRODUCT EXPIRATION DATE: 202002122359
Blood Product Expiration Date: 202002122359
ISSUE DATE / TIME: 202001251235
ISSUE DATE / TIME: 202001251647
UNIT TYPE AND RH: 6200
Unit Type and Rh: 6200

## 2018-02-27 LAB — CBC
HCT: 28.2 % — ABNORMAL LOW (ref 36.0–46.0)
Hemoglobin: 9.4 g/dL — ABNORMAL LOW (ref 12.0–15.0)
MCH: 28.4 pg (ref 26.0–34.0)
MCHC: 33.3 g/dL (ref 30.0–36.0)
MCV: 85.2 fL (ref 80.0–100.0)
Platelets: 168 10*3/uL (ref 150–400)
RBC: 3.31 MIL/uL — ABNORMAL LOW (ref 3.87–5.11)
RDW: 14.6 % (ref 11.5–15.5)
WBC: 3.9 10*3/uL — ABNORMAL LOW (ref 4.0–10.5)
nRBC: 0 % (ref 0.0–0.2)

## 2018-02-27 LAB — PHOSPHORUS: Phosphorus: 6.3 mg/dL — ABNORMAL HIGH (ref 2.5–4.6)

## 2018-02-27 LAB — IRON AND TIBC
Iron: 156 ug/dL (ref 28–170)
Saturation Ratios: 84 % — ABNORMAL HIGH (ref 10.4–31.8)
TIBC: 185 ug/dL — ABNORMAL LOW (ref 250–450)
UIBC: 29 ug/dL

## 2018-02-27 MED ORDER — LABETALOL HCL 300 MG PO TABS: 300.0000 mg | ORAL_TABLET | Freq: Two times a day (BID) | ORAL | 3 refills | Status: DC

## 2018-02-27 MED ORDER — TRAZODONE HCL 50 MG PO TABS
50.0000 mg | ORAL_TABLET | Freq: Once | ORAL | Status: AC
  Administered 2018-02-27: 50 mg via ORAL
  Filled 2018-02-27: qty 1

## 2018-02-27 MED ORDER — FUROSEMIDE 20 MG PO TABS: 60.0000 mg | ORAL_TABLET | Freq: Every day | ORAL | 3 refills | Status: DC

## 2018-02-27 MED ORDER — PNEUMOCOCCAL VAC POLYVALENT 25 MCG/0.5ML IJ INJ: 0.5000 mL | INJECTION | INTRAMUSCULAR | Status: DC

## 2018-02-27 MED ORDER — FUROSEMIDE 40 MG PO TABS
60.0000 mg | ORAL_TABLET | Freq: Every day | ORAL | Status: DC
  Administered 2018-02-27: 60 mg via ORAL
  Filled 2018-02-27: qty 1

## 2018-02-27 MED ORDER — AMLODIPINE BESYLATE 10 MG PO TABS: 10.0000 mg | ORAL_TABLET | Freq: Every day | ORAL | 3 refills | Status: DC

## 2018-02-27 MED ORDER — INFLUENZA VAC SPLIT QUAD 0.5 ML IM SUSY
0.5000 mL | PREFILLED_SYRINGE | Freq: Once | INTRAMUSCULAR | Status: AC
  Administered 2018-02-27: 0.5 mL via INTRAMUSCULAR
  Filled 2018-02-27: qty 0.5

## 2018-02-27 NOTE — Discharge Instructions (Addendum)
Low-Sodium Eating Plan Sodium, which is an element that makes up salt, helps you maintain a healthy balance of fluids in your body. Too much sodium can increase your blood pressure and cause fluid and waste to be held in your body. Your health care provider or dietitian may recommend following this plan if you have high blood pressure (hypertension), kidney disease, liver disease, or heart failure. Eating less sodium can help lower your blood pressure, reduce swelling, and protect your heart, liver, and kidneys. What are tips for following this plan? General guidelines  Most people on this plan should limit their sodium intake to 1,500-2,000 mg (milligrams) of sodium each day. Reading food labels   The Nutrition Facts label lists the amount of sodium in one serving of the food. If you eat more than one serving, you must multiply the listed amount of sodium by the number of servings.  Choose foods with less than 140 mg of sodium per serving.  Avoid foods with 300 mg of sodium or more per serving. Shopping  Look for lower-sodium products, often labeled as "low-sodium" or "no salt added."  Always check the sodium content even if foods are labeled as "unsalted" or "no salt added".  Buy fresh foods. ? Avoid canned foods and premade or frozen meals. ? Avoid canned, cured, or processed meats  Buy breads that have less than 80 mg of sodium per slice. Cooking  Eat more home-cooked food and less restaurant, buffet, and fast food.  Avoid adding salt when cooking. Use salt-free seasonings or herbs instead of table salt or sea salt. Check with your health care provider or pharmacist before using salt substitutes.  Cook with plant-based oils, such as canola, sunflower, or olive oil. Meal planning  When eating at a restaurant, ask that your food be prepared with less salt or no salt, if possible.  Avoid foods that contain MSG (monosodium glutamate). MSG is sometimes added to [place] food,  bouillon, and some canned foods. What foods are recommended? The items listed may not be a complete list. Talk with your dietitian about what dietary choices are best for you. Grains Low-sodium cereals, including oats, puffed wheat and rice, and shredded wheat. Low-sodium crackers. Unsalted rice. Unsalted pasta. Low-sodium bread. Whole-grain breads and whole-grain pasta. Vegetables Fresh or frozen vegetables. "No salt added" canned vegetables. "No salt added" tomato sauce and paste. Low-sodium or reduced-sodium tomato and vegetable juice. Fruits Fresh, frozen, or canned fruit. Fruit juice. Meats and other protein foods Fresh or frozen (no salt added) meat, poultry, seafood, and fish. Low-sodium canned tuna and salmon. Unsalted nuts. Dried peas, beans, and lentils without added salt. Unsalted canned beans. Eggs. Unsalted nut butters. Dairy Milk. Soy milk. Cheese that is naturally low in sodium, such as ricotta cheese, fresh mozzarella, or Swiss cheese Low-sodium or reduced-sodium cheese. Cream cheese. Yogurt. Fats and oils Unsalted butter. Unsalted margarine with no trans fat. Vegetable oils such as canola or olive oils. Seasonings and other foods Fresh and dried herbs and spices. Salt-free seasonings. Low-sodium mustard and ketchup. Sodium-free salad dressing. Sodium-free light mayonnaise. Fresh or refrigerated horseradish. Lemon juice. Vinegar. Homemade, reduced-sodium, or low-sodium soups. Unsalted popcorn and pretzels. Low-salt or salt-free chips. What foods are not recommended? The items listed may not be a complete list. Talk with your dietitian about what dietary choices are best for you. Grains Instant hot cereals. Bread stuffing, pancake, and biscuit mixes. Croutons. Seasoned rice or pasta mixes. Noodle soup cups. Boxed or frozen macaroni and cheese. Regular salted  crackers. Self-rising flour. Vegetables Sauerkraut, pickled vegetables, and relishes. Olives. [place] fries. Onion rings.  Regular canned vegetables (not low-sodium or reduced-sodium). Regular canned tomato sauce and paste (not low-sodium or reduced-sodium). Regular tomato and vegetable juice (not low-sodium or reduced-sodium). Frozen vegetables in sauces. Meats and other protein foods Meat or fish that is salted, canned, smoked, spiced, or pickled. Bacon, ham, sausage, hotdogs, corned beef, chipped beef, packaged lunch meats, salt pork, jerky, pickled herring, anchovies, regular canned tuna, sardines, salted nuts. Dairy Processed cheese and cheese spreads. Cheese curds. Blue cheese. Feta cheese. String cheese. Regular cottage cheese. Buttermilk. Canned milk. Fats and oils Salted butter. Regular margarine. Ghee. Bacon fat. Seasonings and other foods Onion salt, garlic salt, seasoned salt, table salt, and sea salt. Canned and packaged gravies. Worcestershire sauce. Tartar sauce. Barbecue sauce. Teriyaki sauce. Soy sauce, including reduced-sodium. Steak sauce. Fish sauce. Oyster sauce. Cocktail sauce. Horseradish that you find on the shelf. Regular ketchup and mustard. Meat flavorings and tenderizers. Bouillon cubes. Hot sauce and Tabasco sauce. Premade or packaged marinades. Premade or packaged taco seasonings. Relishes. Regular salad dressings. Salsa. Potato and tortilla chips. Corn chips and puffs. Salted popcorn and pretzels. Canned or dried soups. Pizza. Frozen entrees and pot pies. Summary  Eating less sodium can help lower your blood pressure, reduce swelling, and protect your heart, liver, and kidneys.  Most people on this plan should limit their sodium intake to 1,500-2,000 mg (milligrams) of sodium each day.  Canned, boxed, and frozen foods are high in sodium. Restaurant foods, fast foods, and pizza are also very high in sodium. You also get sodium by adding salt to food.  Try to cook at home, eat more fresh fruits and vegetables, and eat less fast food, canned, processed, or prepared foods. This information is  not intended to replace advice given to you by your health care provider. Make sure you discuss any questions you have with your health care provider. Document Released: 07/11/2001 Document Revised: 01/13/2016 Document Reviewed: 01/13/2016 Elsevier Interactive Patient Education  2019 Else[employer] Inc[employer]   Chronic Kidney Disease, Adult Chronic kidney disease (CKD) happens when the kidneys are damaged over a long period of time. The kidneys are two organs that help with:  Getting rid of waste and extra fluid from the blood.  Making hormones that maintain the amount of fluid in your tissues and blood vessels.  Making sure that the body has the right amount of fluids and chemicals. Most of the time, CKD does not go away, but it can usually be controlled. Steps must be taken to slow down the kidney damage or to stop it from getting worse. If this is not done, the kidneys may stop working. Follow these instructions at home: Medicines  Take over-the-counter and prescription medicines only as told by your doctor. You may need to change the amount of medicines you take.  Do not take any new medicines unless your doctor says it is okay. Many medicines can make your kidney damage worse.  Do not take any vitamin and supplements unless your doctor says it is okay. Many vitamins and supplements can make your kidney damage worse. General instructions  Follow a diet as told by your doctor. You may need to stay away from: ? Alcohol. ? Salty foods. ? Foods that are high in:  Potassium.  Calcium.  Protein.  Do not use any products that contain nicotine or tobacco, such as cigarettes and e-cigarettes. If you need help quitting, ask your doctor.  Keep track  of your blood pressure at home. Tell your doctor about any changes.  If you have diabetes, keep track of your blood sugar as told by your doctor.  Try to stay at a healthy weight. If you need help, ask your doctor.  Exercise at least 30 minutes a  day, 5 days a week.  Stay up-to-date with your shots (immunizations) as told by your doctor.  Keep all follow-up visits as told by your doctor. This is important. Contact a doctor if:  Your symptoms get worse.  You have new symptoms. Get help right away if:  You have symptoms of end-stage kidney disease. These may include: ? Headaches. ? Numbness in your hands or feet. ? Easy bruising. ? Having hiccups often. ? Chest pain. ? Shortness of breath. ? Stopping of menstrual periods in women.  You have a fever.  You have very little pee (urine).  You have pain or bleeding when you pee. Summary  Chronic kidney disease (CKD) happens when the kidneys are damaged over a long period of time.  Most of the time, this condition does not go away, but it can usually be controlled. Steps must be taken to slow down the kidney damage or to stop it from getting worse.  Treatment may include a combination of medicines and lifestyle changes. This information is not intended to replace advice given to you by your health care provider. Make sure you discuss any questions you have with your health care provider. Document Released: 04/15/2009 Document Revised: 02/24/2016 Document Reviewed: 02/24/2016 Elsevier Interactive Patient Education  2019 Else[employer] Inc.   Chronic Kidney Disease, Adult Chronic kidney disease (CKD) happens when the kidneys are damaged over a long period of time. The kidneys are two organs that help with:  Getting rid of waste and extra fluid from the blood.  Making hormones that maintain the amount of fluid in your tissues and blood vessels.  Making sure that the body has the right amount of fluids and chemicals. Most of the time, CKD does not go away, but it can usually be controlled. Steps must be taken to slow down the kidney damage or to stop it from getting worse. If this is not done, the kidneys may stop working. Follow these instructions at home: Medicines  Take  over-the-counter and prescription medicines only as told by your doctor. You may need to change the amount of medicines you take.  Do not take any new medicines unless your doctor says it is okay. Many medicines can make your kidney damage worse.  Do not take any vitamin and supplements unless your doctor says it is okay. Many vitamins and supplements can make your kidney damage worse. General instructions  Follow a diet as told by your doctor. You may need to stay away from: ? Alcohol. ? Salty foods. ? Foods that are high in:  Potassium.  Calcium.  Protein.  Do not use any products that contain nicotine or tobacco, such as cigarettes and e-cigarettes. If you need help quitting, ask your doctor.  Keep track of your blood pressure at home. Tell your doctor about any changes.  If you have diabetes, keep track of your blood sugar as told by your doctor.  Try to stay at a healthy weight. If you need help, ask your doctor.  Exercise at least 30 minutes a day, 5 days a week.  Stay up-to-date with your shots (immunizations) as told by your doctor.  Keep all follow-up visits as told by your doctor. This  is important. Contact a doctor if:  Your symptoms get worse.  You have new symptoms. Get help right away if:  You have symptoms of end-stage kidney disease. These may include: ? Headaches. ? Numbness in your hands or feet. ? Easy bruising. ? Having hiccups often. ? Chest pain. ? Shortness of breath. ? Stopping of menstrual periods in women.  You have a fever.  You have very little pee (urine).  You have pain or bleeding when you pee. Summary  Chronic kidney disease (CKD) happens when the kidneys are damaged over a long period of time.  Most of the time, this condition does not go away, but it can usually be controlled. Steps must be taken to slow down the kidney damage or to stop it from getting worse.  Treatment may include a combination of medicines and lifestyle  changes. This information is not intended to replace advice given to you by your health care provider. Make sure you discuss any questions you have with your health care provider. Document Released: 04/15/2009 Document Revised: 02/24/2016 Document Reviewed: 02/24/2016 Elsevier Interactive Patient Education  2019 Else[employer] Inc. Acute Kidney Injury, Adult  Acute kidney injury is a sudden worsening of kidney function. The kidneys are organs that have several jobs. They filter the blood to remove waste products and extra fluid. They also maintain a healthy balance of minerals and hormones in the body, which helps control blood pressure and keep bones strong. With this condition, your kidneys do not do their jobs as well as they should. This condition ranges from mild to severe. Over time it may develop into long-lasting (chronic) kidney disease. Early detection and treatment may prevent acute kidney injury from developing into a chronic condition. What are the causes? Common causes of this condition include:  A problem with blood flow to the kidneys. This may be caused by: ? Low blood pressure (hypotension) or shock. ? Blood loss. ? Heart and blood vessel (cardiovascular) disease. ? Severe burns. ? Liver disease.  Direct damage to the kidneys. This may be caused by: ? Certain medicines. ? A kidney infection. ? Poisoning. ? Being around or in contact with toxic substances. ? A surgical wound. ? A hard, direct hit to the kidney area.  A sudden blockage of urine flow. This may be caused by: ? Cancer. ? Kidney stones. ? An enlarged prostate in males. What are the signs or symptoms? Symptoms of this condition may not be obvious until the condition becomes severe. Symptoms of this condition can include:  Tiredness (lethargy), or difficulty staying awake.  Nausea or vomiting.  Swelling (edema) of the face, legs, ankles, or feet.  Problems with urination, such as: ? Abdominal pain, or  pain along the side of your stomach (flank). ? Decreased urine production. ? Decrease in the force of urine flow.  Muscle twitches and cramps, especially in the legs.  Confusion or trouble concentrating.  Loss of appetite.  Fever. How is this diagnosed? This condition may be diagnosed with tests, including:  Blood tests.  Urine tests.  Imaging tests.  A test in which a sample of tissue is removed from the kidneys to be examined under a microscope (kidney biopsy). How is this treated? Treatment for this condition depends on the cause and how severe the condition is. In mild cases, treatment may not be needed. The kidneys may heal on their own. In more severe cases, treatment will involve:  Treating the cause of the kidney injury. This may involve changing  any medicines you are taking or adjusting your dosage.  Fluids. You may need specialized IV fluids to balance your body's needs.  Having a catheter placed to drain urine and prevent blockages.  Preventing problems from occurring. This may mean avoiding certain medicines or procedures that can cause further injury to the kidneys. In some cases treatment may also require:  A procedure to remove toxic wastes from the body (dialysis or continuous renal replacement therapy - CRRT).  Surgery. This may be done to repair a torn kidney, or to remove the blockage from the urinary system. Follow these instructions at home: Medicines  Take over-the-counter and prescription medicines only as told by your health care provider.  Do not take any new medicines without your health care provider's approval. Many medicines can worsen your kidney damage.  Do not take any vitamin and mineral supplements without your health care provider's approval. Many nutritional supplements can worsen your kidney damage. Lifestyle  If your health care provider prescribed changes to your diet, follow them. You may need to decrease the amount of protein you  eat.  Achieve and maintain a healthy weight. If you need help with this, ask your health care provider.  Start or continue an exercise plan. Try to exercise at least 30 minutes a day, 5 days a week.  Do not use any tobacco products, such as cigarettes, chewing tobacco, and e-cigarettes. If you need help quitting, ask your health care provider. General instructions  Keep track of your blood pressure. Report changes in your blood pressure as told by your health care provider.  Stay up to date with immunizations. Ask your health care provider which immunizations you need.  Keep all follow-up visits as told by your health care provider. This is important. Where to find more information  American Association of Kidney Patients: BombTimer.gl  National Kidney Foundation: www.kidney.[place]: https://mathis.com/  [facility]: ? www.lifeoptions.org ? www.kidneyschool.org Contact a health care provider if:  Your symptoms get worse.  You develop new symptoms. Get help right away if:  You develop symptoms of worsening kidney disease, which include: ? Headaches. ? Abnormally dark or light skin. ? Easy bruising. ? Frequent hiccups. ? Chest pain. ? Shortness of breath. ? End of menstruation in women. ? Seizures. ? Confusion or altered mental status. ? Abdominal or back pain. ? Itchiness.  You have a fever.  Your body is producing less urine.  You have pain or bleeding when you urinate. Summary  Acute kidney injury is a sudden worsening of kidney function.  Acute kidney injury can be caused by problems with blood flow to the kidneys, direct damage to the kidneys, and sudden blockage of urine flow.  Symptoms of this condition may not be obvious until it becomes severe. Symptoms may include edema, lethargy, confusion, nausea or vomiting, and problems passing urine.  This condition can usually be diagnosed with blood tests, urine tests, and  imaging tests. Sometimes a kidney biopsy is done to diagnose this condition.  Treatment for this condition often involves treating the underlying cause. It is treated with fluids, medicines, dialysis, diet changes, or surgery. This information is not intended to replace advice given to you by your health care provider. Make sure you discuss any questions you have with your health care provider. Document Released: 08/04/2010 Document Revised: 05/21/2016 Document Reviewed: 01/10/2016 Elsevier Interactive Patient Education  2019 [employer].

## 2018-02-27 NOTE — Progress Notes (Signed)
[facility] Progress Note  Subjective: peed " a lot" last night, not sure they recorded it all. No new /co.  Says she will take all her meds and f/u w/ kidney doctor.    Vitals:   02/26/18 2037 02/27/18 0117 02/27/18 0522 02/27/18 0951  BP: (!) 163/87  (!) 170/92 (!) 146/83  Pulse: 89  81 87  Resp: 18  18 18   Temp: 98.6 F (37 C)  97.8 F (36.6 C) 98.1 F (36.7 C)  TempSrc: Oral  Oral Oral  SpO2: 99%  97% 100%  Weight: 47 kg 47 kg    Height:        Inpatient medications: . amLODipine  10 mg Oral Daily  . furosemide  40 mg Intravenous Q12H  . heparin  5,000 Units Subcutaneous Q8H  . labetalol  300 mg Oral BID  . [START ON 02/28/2018] pneumococcal 23 valent vaccine  0.5 mL Intramuscular Tomorrow-1000    acetaminophen, hydrALAZINE  Iron/TIBC/Ferritin/ %Sat    Component Value Date/Time   IRON 156 02/27/2018 0533   TIBC 185 (L) 02/27/2018 0533   FERRITIN 122 02/26/2018 1155   IRONPCTSAT 84 (H) 02/27/2018 0533    Exam: Gen thin AAF no distress No rash, cyanosis or gangrene Sclera anicteric, throat clear  No jvd or bruits Chest cleat bilat RRR no MRG Abd soft ntnd no mass or ascites +bs no bruits GU defer MS no joint effusions or deformity Ext no edema Neuro is alert, Ox 3 , nf, no asterixis    Home meds:  - none    Na 138 K 3.9 CO2 14  aG 12  BUN 47  Cr 4.62  Ca 7.8  Alb 3.3  Uric acid 8.3  LFT's ok.  eGFR 12 cc/min  Hb 6.8  WBC 5.2  plt 213  tsh 3.5  UA 1.011  Many bact 0-5 rbc/epi, >50 wbc, prot 30  FOB negative    CXR no active disease   Renal US 8-9 cm kidneys, chronic changes, no hydro, +cysts simple      Assessment: 1. Renal failure CKD stage IV - bland UA, chronic changes on [place], c/w progressive CKD likely due to poorly controlled HTN.  Not f/b any nephrologist. Please send home on po lasix 60 qd, norvasc 10 qd and labetalol 300 bid.  OK for dc.  Does not need vein mapping here.  Our office will set up f/u appt , they will  contact the patient this week.   2. HTN noncompliant w/ meds, better control 3. Vol excess - better after IV lasix last night 4. Anemia - prob from CKD, sp prbc's x 2 5. MBD ckd - check phos, Ca low which is expected, check pth   P:  OK for dc    Kelly Splinter MD [facility] pager [phone]   02/27/2018, 12:15 PM   Recent Labs  Lab 02/25/18 1233 02/26/18 0930 02/27/18 0533  NA 140 138 139  K 4.4 3.9 3.5  CL 112 112* 114*  CO2 16* 14* 14*  GLUCOSE 94 105* 89  BUN 50* 47* 46*  CREATININE 4.44* 4.62* 4.71*  CALCIUM 7.9* 7.8* 7.7*  PHOS  --   --  6.3*  ALBUMIN 3.4* 3.3*  --    Recent Labs  Lab 02/25/18 1233 02/26/18 0930  AST 16 19  ALT 7 9  ALKPHOS 52 47  BILITOT 0.5 0.7  PROT 6.6 6.4*   Recent Labs  Lab 02/25/18 1233 02/26/18 0930 02/27/18  0533  WBC 5.4 5.2 3.9*  NEUTROABS 2.9 2.3  --   HGB 6.9 Repeated and verified X2.* 6.8* 9.4*  HCT 20.8 Repeated and verified X2.* 22.1* 28.2*  MCV 91.5 94.0 85.2  PLT 220.0 213 168

## 2018-02-27 NOTE — Discharge Summary (Addendum)
Physician Discharge Summary  Kimberly Frost GMW:[id number] DOB: 11-16-1950 DOA: 02/26/2018  PCP: [name], NP  Admit date: 02/26/2018 Discharge date: 02/27/2018  Time spent: 25* minutes  Recommendations for Outpatient Follow-up:  1. Follow-up PCP in 2 weeks. 2. Follow-up nephrology next week.   Discharge Diagnoses:  Principal Problem:   Anemia due to stage 5 chronic kidney disease (Kimberly Frost) Active Problems:   Essential hypertension, benign   Hepatitis C   Peripheral edema   Anemia   Discharge Condition: Stable  Diet recommendation: Renal diet  Filed Weights   02/26/18 1624 02/26/18 2037 02/27/18 0117  Weight: 47 kg 47 kg 47 kg    History of present illness:  68 year old female with a history of chronic untreated hypertension, noncompliance, hepatitis C untreated, history of CKD with progressive deterioration came to the ED with significant elevated creatinine and hemoglobin less than 7.  Hemoglobin 6.9, patient received 2 units PRBC yesterday.  Repeat hemoglobin today is 9.4. Creatinine is 4.71 today.   Hospital Course:   Stage V CKD-secondary hypertension, untreated.  No evidence of uremia, potassium was normal.  Nephrology was consulted and at this time patient will be discharged on 60 mg of Lasix daily.  Nephrology is going to contact patient next week for follow-up.  Anemia chronic disease-secondary to CKD as above.  Hemoglobin has improved to 9.4 after 2 units of PRBC.  Hypertension-patient started on Norvasc 10 mg daily, labetalol 300 mg p.o. twice daily.  Lasix 60 mg daily.    Procedures:  Renal ultrasound-showed no hydronephrosis  Consultations:  Nephrology  Discharge Exam: Vitals:   02/27/18 0522 02/27/18 0951  BP: (!) 170/92 (!) 146/83  Pulse: 81 87  Resp: 18 18  Temp: 97.8 F (36.6 C) 98.1 F (36.7 C)  SpO2: 97% 100%    General: Appears in no acute distress Cardiovascular: S1-S2, regular Respiratory: Clear to auscultation  bilaterally  Discharge Instructions   Discharge Instructions    Diet - low sodium heart healthy   Complete by:  As directed    Increase activity slowly   Complete by:  As directed      Allergies as of 02/27/2018   No Known Allergies     Medication List    TAKE these medications   amLODipine 10 MG tablet Commonly known as:  NORVASC Take 1 tablet (10 mg total) by mouth daily. Start taking on:  February 28, 2018   furosemide 20 MG tablet Commonly known as:  LASIX Take 3 tablets (60 mg total) by mouth daily.   labetalol 300 MG tablet Commonly known as:  NORMODYNE Take 1 tablet (300 mg total) by mouth 2 (two) times daily.      No Known Allergies    The results of significant diagnostics from this hospitalization (including imaging, microbiology, ancillary and laboratory) are listed below for reference.    Significant Diagnostic Studies: Dg Chest 2 View  Result Date: 02/26/2018 CLINICAL DATA:  Chest pain. Lower extremity swelling for 1 week. Low hemoglobin. EXAM: CHEST - 2 VIEW COMPARISON:  01/26/2013 FINDINGS: The heart size and mediastinal contours are within normal limits. Aortic atherosclerosis. Both lungs are clear. The visualized skeletal structures are unremarkable. IMPRESSION: Stable exam.  No active cardiopulmonary disease. Electronically Signed   By: Earle Gell M.D.   On: 02/26/2018 10:00   US Renal  Result Date: 02/26/2018 CLINICAL DATA:  Chronic kidney disease.  Hypertension EXAM: RENAL / URINARY TRACT ULTRASOUND COMPLETE COMPARISON:  None. FINDINGS: Right Kidney: Renal measurements: 9.4  x 3.7 x 4.8 cm = volume: 88 mL. Increased parenchymal echogenicity. No hydronephrosis. Several cysts noted within the upper pole of right kidney. These appear anechoic with increased through transmission. The largest measures 1.8 x 1.3 x 2.2 cm. Left Kidney: Renal measurements: 8.9 x 4.5 x 5.0 cm = volume: 105.4 mL. Increased parenchymal echogenicity. No hydronephrosis.Mild  perinephric fluid identified. Bladder: Appears normal for degree of bladder distention. IMPRESSION: 1. No hydronephrosis identified bilaterally. 2. Bilateral echogenic kidneys compatible with chronic medical renal disease. A small amount of perinephric fluid is identified on the left. 3. Right kidney cysts. Electronically Signed   By: Kerby Moors M.D.   On: 02/26/2018 12:38    Microbiology: No results found for this or any previous visit (from the past 240 hour(s)).   Labs: Basic Metabolic Panel: Recent Labs  Lab 02/25/18 1233 02/26/18 0930 02/27/18 0533  NA 140 138 139  K 4.4 3.9 3.5  CL 112 112* 114*  CO2 16* 14* 14*  GLUCOSE 94 105* 89  BUN 50* 47* 46*  CREATININE 4.44* 4.62* 4.71*  CALCIUM 7.9* 7.8* 7.7*  PHOS  --   --  6.3*   Liver Function Tests: Recent Labs  Lab 02/25/18 1233 02/26/18 0930  AST 16 19  ALT 7 9  ALKPHOS 52 47  BILITOT 0.5 0.7  PROT 6.6 6.4*  ALBUMIN 3.4* 3.3*   No results for input(s): LIPASE, AMYLASE in the last 168 hours. No results for input(s): AMMONIA in the last 168 hours. CBC: Recent Labs  Lab 02/25/18 1233 02/26/18 0930 02/27/18 0533  WBC 5.4 5.2 3.9*  NEUTROABS 2.9 2.3  --   HGB 6.9 Repeated and verified X2.* 6.8* 9.4*  HCT 20.8 Repeated and verified X2.* 22.1* 28.2*  MCV 91.5 94.0 85.2  PLT 220.0 213 168    ProBNP (last 3 results) Recent Labs    02/25/18 1233  PROBNP 838.0*        Signed:  Oswald Hillock MD.  [facility] 02/27/2018, 12:29 PM

## 2018-02-28 ENCOUNTER — Telehealth: Payer: Self-pay | Admitting: *Deleted

## 2018-02-28 LAB — PARATHYROID HORMONE, INTACT (NO CA): PTH: 426 pg/mL — AB (ref 15–65)

## 2018-02-28 NOTE — Telephone Encounter (Signed)
Left message on voicemail for patient to call back. Patient has a 15 minute appointment scheduled on 03/11/18 and needs to be changed to a hospital follow-up 30 minutes.See hospital discharge summary. TCM call needs to be completed.

## 2018-03-01 NOTE — Telephone Encounter (Signed)
TCM call attempt #2 - Unable to leave message on primary phone.

## 2018-03-01 NOTE — Telephone Encounter (Signed)
TCM call attempt #3 - Attempted to reach fiance Mr. Owens Shark on his mobile phone number. Attempt unsuccessful. Left message with contact info.

## 2018-03-04 NOTE — Telephone Encounter (Signed)
Pt returning call to nurse and stated she is having problems with leg swelling.

## 2018-03-07 ENCOUNTER — Encounter: Payer: Self-pay | Admitting: *Deleted

## 2018-03-07 NOTE — Telephone Encounter (Signed)
Left a message on answering machine at home number for patient to call back. Patient's fiance called back and stated that he will have her call back this afternoon when she gets off from work.  Mr. Owens Shark (fiance on DPR) stated that he told her last week she needed to call the office back. Mr. Owens Shark stated that he thinka that one she has swelling in just one of her legs, but uncertain which one. Advised him to let her know that it is important for her to call the office back.

## 2018-03-07 NOTE — Telephone Encounter (Addendum)
Patient called back stating that her right leg was swollen and hurting Friday. Patient stated that her right leg and ankle is swollen today and only sore when she touches it..  Patient stated that her right leg gets puffy and seems to be worse after she goes to work and stands a lot on it. Patient stated that her leg got better over the weekend while she was off from work. .Patient stated that she had a good day today with her leg. Offered patient an appointment tomorrow to see Webb Silversmith NP which patient declined stating that she needs a late afternoon appointment because of work and getting occurrences Scheduled patient for Wednesday 03/09/18 at 4:15. Advised patient if symptoms get worse before her appointment to go back to the ED and she verbalized understanding..Patient stated that she already has an appointment scheduled with her kidney doctor.

## 2018-03-09 ENCOUNTER — Ambulatory Visit (INDEPENDENT_AMBULATORY_CARE_PROVIDER_SITE_OTHER): Payer: Medicare Other | Admitting: Internal Medicine

## 2018-03-09 ENCOUNTER — Telehealth: Payer: Self-pay

## 2018-03-09 ENCOUNTER — Encounter: Payer: Self-pay | Admitting: Internal Medicine

## 2018-03-09 VITALS — BP 142/78 | HR 66 | Temp 98.2°F | Wt 110.0 lb

## 2018-03-09 DIAGNOSIS — I1310 Hypertensive heart and chronic kidney disease without heart failure, with stage 1 through stage 4 chronic kidney disease, or unspecified chronic kidney disease: Secondary | ICD-10-CM | POA: Diagnosis not present

## 2018-03-09 DIAGNOSIS — R609 Edema, unspecified: Secondary | ICD-10-CM | POA: Diagnosis not present

## 2018-03-09 DIAGNOSIS — N184 Chronic kidney disease, stage 4 (severe): Secondary | ICD-10-CM

## 2018-03-09 DIAGNOSIS — I10 Essential (primary) hypertension: Secondary | ICD-10-CM

## 2018-03-09 DIAGNOSIS — D631 Anemia in chronic kidney disease: Secondary | ICD-10-CM

## 2018-03-09 NOTE — Progress Notes (Signed)
Subjective:    Patient ID: Kimberly Frost, female    DOB: [birth year], 68 y.o.   MRN: [id number]  HPI  Pt presents to the clinic today for [facility] Follow up. She went to the ER 1/25 with c/o BLE edema. She has a history of stage 4 CKD secondary to uncontrolled HTN. She was started on Amlodipine and Labetalol. She was given Lasix for the swelling. She was found to be anemic, so she was transfused 2 units PRBC. Renal ultrasound did not show any hydronephrosis. She was diagnosed 1/26 and advised to follow up with PCP and nephrology. Since discharge, she reports she still has some swelling in her legs. She is taking all medications as prescribed. She has an appt with nephrology scheduled.  Review of Systems      Past Medical History:  Diagnosis Date  . Hepatitis   . Hypertension     Current Outpatient Medications  Medication Sig Dispense Refill  . amLODipine (NORVASC) 10 MG tablet Take 1 tablet (10 mg total) by mouth daily. 30 tablet 3  . furosemide (LASIX) 20 MG tablet Take 3 tablets (60 mg total) by mouth daily. 30 tablet 3  . labetalol (NORMODYNE) 300 MG tablet Take 1 tablet (300 mg total) by mouth 2 (two) times daily. 60 tablet 3   No current facility-administered medications for this visit.     No Known Allergies  Family History  Problem Relation Age of Onset  . Diabetes Mother   . Hypertension Mother   . Bone cancer Father   . Kidney cancer Maternal Grandmother   . Stroke Brother   . Diabetes Brother     Social History   Socioeconomic History  . Marital status: Single    Spouse name: Not on file  . Number of children: Not on file  . Years of education: Not on file  . Highest education level: Not on file  Occupational History  . Not on file  Social Needs  . Financial resource strain: Not on file  . Food insecurity:    Worry: Not on file    Inability: Not on file  . Transportation needs:    Medical: Not on file    Non-medical: Not on file  Tobacco Use    . Smoking status: Former Research scientist (life sciences)  . Smokeless tobacco: Never Used  . Tobacco comment: 30+ years ago  Substance and Sexual Activity  . Alcohol use: Yes    Alcohol/week: 6.0 standard drinks    Types: 6 Cans of beer per week    Comment: occasional  . Drug use: Yes    Types: Marijuana  . Sexual activity: Not Currently  Lifestyle  . Physical activity:    Days per week: Not on file    Minutes per session: Not on file  . Stress: Not on file  Relationships  . Social connections:    Talks on phone: Not on file    Gets together: Not on file    Attends religious service: Not on file    Active member of club or organization: Not on file    Attends meetings of clubs or organizations: Not on file    Relationship status: Not on file  . Intimate partner violence:    Fear of current or ex partner: Not on file    Emotionally abused: Not on file    Physically abused: Not on file    Forced sexual activity: Not on file  Other Topics Concern  . Not on  file  Social History Narrative  . Not on file     Constitutional: Denies fever, malaise, fatigue, headache or abrupt weight changes.  Respiratory: Denies difficulty breathing, shortness of breath, cough or sputum production.   Cardiovascular: Pt reports peripheral edema. Denies chest pain, chest tightness, palpitations or swelling in the hands.  Skin: Denies redness, rashes, lesions or ulcercations.    No other specific complaints in a complete review of systems (except as listed in HPI above).  Objective:   Physical Exam   BP (!) 142/78   Pulse 66   Temp 98.2 F (36.8 C) (Oral)   Wt 110 lb (49.9 kg)   SpO2 99%   BMI 17.75 kg/m  Wt Readings from Last 3 Encounters:  03/09/18 110 lb (49.9 kg)  02/27/18 103 lb 9.9 oz (47 kg)  02/25/18 106 lb 4 oz (48.2 kg)    General: Appears her stated age, well developed, well nourished in NAD. Skin: Warm, dry and intact. No redness or erythema noted Cardiovascular: Normal rate and rhythm.  S1,S2 noted.  No murmur, rubs or gallops noted. 1 + pitting BLE edema.  Pulmonary/Chest: Normal effort and positive vesicular breath sounds. No respiratory distress. No wheezes, rales or ronchi noted.  Musculoskeletal:  No difficulty with gait.  Neurological: Alert and oriented.    BMET    Component Value Date/Time   NA 139 02/27/2018 0533   K 3.5 02/27/2018 0533   CL 114 (H) 02/27/2018 0533   CO2 14 (L) 02/27/2018 0533   GLUCOSE 89 02/27/2018 0533   BUN 46 (H) 02/27/2018 0533   CREATININE 4.71 (H) 02/27/2018 0533   CREATININE 2.00 (H) 02/15/2013 1551   CALCIUM 7.7 (L) 02/27/2018 0533   GFRNONAA 9 (L) 02/27/2018 0533   GFRAA 10 (L) 02/27/2018 0533    Lipid Panel     Component Value Date/Time   CHOL 154 02/25/2018 1233   TRIG 65.0 02/25/2018 1233   HDL 67.40 02/25/2018 1233   CHOLHDL 2 02/25/2018 1233   VLDL 13.0 02/25/2018 1233   LDLCALC 74 02/25/2018 1233    CBC    Component Value Date/Time   WBC 3.9 (L) 02/27/2018 0533   RBC 3.31 (L) 02/27/2018 0533   HGB 9.4 (L) 02/27/2018 0533   HCT 28.2 (L) 02/27/2018 0533   PLT 168 02/27/2018 0533   MCV 85.2 02/27/2018 0533   MCH 28.4 02/27/2018 0533   MCHC 33.3 02/27/2018 0533   RDW 14.6 02/27/2018 0533   LYMPHSABS 2.3 02/26/2018 0930   MONOABS 0.3 02/26/2018 0930   EOSABS 0.2 02/26/2018 0930   BASOSABS 0.0 02/26/2018 0930    Hgb A1C No results found for: HGBA1C        Assessment & Plan:   [facility] Follow Up for Stage 4 CKD, HTN, Anemia of CKD:  Hospital notes, labs and imaging reviewed BP much better controlled Continue Amlodipine, Lasix and Labetalol CBC and CMET today RX for TED hose given today Encouraged elevation Work note given- will need FMLA forms completed (out until 03/24/18) Follow up with nephrology as planned  Return precautions discussed Webb Silversmith, NP

## 2018-03-09 NOTE — Patient Instructions (Signed)
Anemia  Anemia is a condition in which you do not have enough red blood cells or hemoglobin. Hemoglobin is a substance in red blood cells that carries oxygen. When you do not have enough red blood cells or hemoglobin (are anemic), your body cannot get enough oxygen and your organs may not work properly. As a result, you may feel very tired or have other problems. What are the causes? Common causes of anemia include:  Excessive bleeding. Anemia can be caused by excessive bleeding inside or outside the body, including bleeding from the intestine or from periods in women.  Poor nutrition.  Long-lasting (chronic) kidney, thyroid, and liver disease.  Bone marrow disorders.  Cancer and treatments for cancer.  HIV (human immunodeficiency virus) and AIDS (acquired immunodeficiency syndrome).  Treatments for HIV and AIDS.  Spleen problems.  Blood disorders.  Infections, medicines, and autoimmune disorders that destroy red blood cells. What are the signs or symptoms? Symptoms of this condition include:  Minor weakness.  Dizziness.  Headache.  Feeling heartbeats that are irregular or faster than normal (palpitations).  Shortness of breath, especially with exercise.  Paleness.  Cold sensitivity.  Indigestion.  Nausea.  Difficulty sleeping.  Difficulty concentrating. Symptoms may occur suddenly or develop slowly. If your anemia is mild, you may not have symptoms. How is this diagnosed? This condition is diagnosed based on:  Blood tests.  Your medical history.  A physical exam.  Bone marrow biopsy. Your health care provider may also check your stool (feces) for blood and may do additional testing to look for the cause of your bleeding. You may also have other tests, including:  Imaging tests, such as a CT scan or MRI.  Endoscopy.  Colonoscopy. How is this treated? Treatment for this condition depends on the cause. If you continue to lose a lot of blood, you may  need to be treated at a hospital. Treatment may include:  Taking supplements of iron, vitamin S31, or folic acid.  Taking a hormone medicine (erythropoietin) that can help to stimulate red blood cell growth.  Having a blood transfusion. This may be needed if you lose a lot of blood.  Making changes to your diet.  Having surgery to remove your spleen. Follow these instructions at home:  Take over-the-counter and prescription medicines only as told by your health care provider.  Take supplements only as told by your health care provider.  Follow any diet instructions that you were given.  Keep all follow-up visits as told by your health care provider. This is important. Contact a health care provider if:  You develop new bleeding anywhere in the body. Get help right away if:  You are very weak.  You are short of breath.  You have pain in your abdomen or chest.  You are dizzy or feel faint.  You have trouble concentrating.  You have bloody or black, tarry stools.  You vomit repeatedly or you vomit up blood. Summary  Anemia is a condition in which you do not have enough red blood cells or enough of a substance in your red blood cells that carries oxygen (hemoglobin).  Symptoms may occur suddenly or develop slowly.  If your anemia is mild, you may not have symptoms.  This condition is diagnosed with blood tests as well as a medical history and physical exam. Other tests may be needed.  Treatment for this condition depends on the cause of the anemia. This information is not intended to replace advice given to you by  your health care provider. Make sure you discuss any questions you have with your health care provider. Document Released: 02/27/2004 Document Revised: 02/21/2016 Document Reviewed: 02/21/2016 Elsevier Interactive Patient Education  2019 [employer].

## 2018-03-10 ENCOUNTER — Telehealth: Payer: Self-pay | Admitting: Radiology

## 2018-03-10 LAB — COMPREHENSIVE METABOLIC PANEL
ALT: 5 U/L (ref 0–35)
AST: 9 U/L (ref 0–37)
Albumin: 4 g/dL (ref 3.5–5.2)
Alkaline Phosphatase: 54 U/L (ref 39–117)
BUN: 65 mg/dL — AB (ref 6–23)
CO2: 19 meq/L (ref 19–32)
Calcium: 8.3 mg/dL — ABNORMAL LOW (ref 8.4–10.5)
Chloride: 108 mEq/L (ref 96–112)
Creatinine, Ser: 5.28 mg/dL (ref 0.40–1.20)
GFR: 9.81 mL/min — CL (ref >60.00)
Glucose, Bld: 90 mg/dL (ref 70–99)
Potassium: 4.2 mEq/L (ref 3.5–5.1)
Sodium: 139 mEq/L (ref 135–145)
Total Bilirubin: 0.5 mg/dL (ref 0.2–1.2)
Total Protein: 7.3 g/dL (ref 6.0–8.3)

## 2018-03-10 LAB — CBC
HCT: 31.2 % — ABNORMAL LOW (ref 36.0–46.0)
Hemoglobin: 10.4 g/dL — ABNORMAL LOW (ref 12.0–15.0)
MCHC: 33.2 g/dL (ref 30.0–36.0)
MCV: 88.4 fl (ref 78.0–100.0)
Platelets: 221 10*3/uL (ref 150.0–400.0)
RBC: 3.52 Mil/uL — ABNORMAL LOW (ref 3.87–5.11)
RDW: 14.6 % (ref 11.5–15.5)
WBC: 5.4 10*3/uL (ref 4.0–10.5)

## 2018-03-10 NOTE — Telephone Encounter (Signed)
Stage 4 chronic kidney disease

## 2018-03-10 NOTE — Telephone Encounter (Signed)
Does not appear that patient called the office today per triage instructions last pm.  FYI to R. Baity, NP to advise regarding medication instructions.

## 2018-03-10 NOTE — Telephone Encounter (Signed)
[facility] lab called critical results, CRT - 5.28, GFR - 9.81. Results given to [facility]

## 2018-03-10 NOTE — Telephone Encounter (Signed)
Patient Name: Kimberly Frost Gender: Female DOB: Apr 09, 1950 Age: 68 Y 54 D Return Phone Number: [phone] (Primary) Address: City/State/Zip: [place] [address] Client [facility] Night - Client Client Site [place] Physician Webb Silversmith - NP Contact Type Call Who Is Calling Patient / Member / Family / Caregiver Call Type Triage / Clinical Relationship To Patient Self Return Phone Number [phone] (Primary) Chief Complaint Medication Question (non symptomatic) Reason for Call Medication Question / Request Initial Comment Caller states she was just seen and forgot to ask her pcp about blood pressure medication. Caller states she wants to know if it is okay to take with the 3 other meds she is on or if she is needing to mix it. Denies having Sx. Translation No Nurse Assessment Nurse: Carlis Abbott, RN, Estill Bamberg Date/Time (Eastern Time): 03/09/2018 6:09:46 PM Confirm and document reason for call. If symptomatic, describe symptoms. ---Caller states she was just seen and forgot to ask her pcp about blood pressure medication. Caller states she wants to know if it is okay to take with the 3 other meds she is on or if she is needing to mix it. Denies having Sx. Does the patient have any new or worsening symptoms? ---No Please document clinical information provided and list any resource used. ---She just wanted to know if she is suppose to take the 3 medications, Lisinopril, Labetalol and Lasix. She says she is fine and just had the question. Referenced https://www.drugs.com/ interactions-check.php?drug_list=1476-0,1420-0,1146-676 and general information provided. Advised that BP would be a factor in that decision as well. Advised that message will be sent to the office, but to also call the office in the morning to discuss this further. Verbalizes understanding and denies further questions or concerns. Guidelines Guideline Title  Affirmed Question Affirmed Notes Nurse Date/Time (Eastern Time) Disp. Time Eilene Ghazi Time) Disposition Final User 03/09/2018 5:53:35 PM Attempt made - message left Henrine Screws 03/09/2018 6:13:56 PM Clinical Call Yes Carlis Abbott, RN, Estill Bamberg

## 2018-03-11 ENCOUNTER — Ambulatory Visit: Payer: Medicare Other | Admitting: Internal Medicine

## 2018-03-11 NOTE — Telephone Encounter (Signed)
Ok to take all together

## 2018-03-15 NOTE — Telephone Encounter (Signed)
Pt is aware as instructed not to take old Rx lisinopril HCTZ, only 3 prescribed at ED

## 2018-03-24 ENCOUNTER — Telehealth: Payer: Self-pay | Admitting: Internal Medicine

## 2018-03-24 NOTE — Telephone Encounter (Signed)
Pt need return to work note fax to   [employer] Fax: [phone]

## 2018-03-24 NOTE — Telephone Encounter (Signed)
Letter faxed.

## 2018-04-14 ENCOUNTER — Telehealth: Payer: Self-pay | Admitting: Internal Medicine

## 2018-04-14 DIAGNOSIS — Z0279 Encounter for issue of other medical certificate: Secondary | ICD-10-CM

## 2018-04-14 NOTE — Telephone Encounter (Signed)
Done, placed in my outbox.

## 2018-04-14 NOTE — Telephone Encounter (Signed)
Pt dropped fmla paperwork to be filled out In [facility] in box for review and signature

## 2018-04-15 NOTE — Telephone Encounter (Signed)
Left message letting pt know paperwork has been faxed  Copy for pt Copy for scan Copy for billing

## 2018-04-15 NOTE — Telephone Encounter (Signed)
Paperwork faxed °

## 2018-05-18 DIAGNOSIS — I120 Hypertensive chronic kidney disease with stage 5 chronic kidney disease or end stage renal disease: Secondary | ICD-10-CM | POA: Diagnosis not present

## 2018-05-18 DIAGNOSIS — D631 Anemia in chronic kidney disease: Secondary | ICD-10-CM | POA: Diagnosis not present

## 2018-05-18 DIAGNOSIS — N185 Chronic kidney disease, stage 5: Secondary | ICD-10-CM | POA: Diagnosis not present

## 2018-05-18 DIAGNOSIS — N189 Chronic kidney disease, unspecified: Secondary | ICD-10-CM | POA: Diagnosis not present

## 2018-05-18 DIAGNOSIS — N2581 Secondary hyperparathyroidism of renal origin: Secondary | ICD-10-CM | POA: Diagnosis not present

## 2018-05-26 ENCOUNTER — Other Ambulatory Visit (HOSPITAL_COMMUNITY): Payer: Self-pay

## 2018-05-27 ENCOUNTER — Other Ambulatory Visit: Payer: Self-pay

## 2018-05-27 ENCOUNTER — Ambulatory Visit (HOSPITAL_COMMUNITY)
Admission: RE | Admit: 2018-05-27 | Discharge: 2018-05-27 | Disposition: A | Discharge status: Home / Self Care | Payer: Medicare Other | Source: Ambulatory Visit | Attending: Nephrology | Admitting: Nephrology

## 2018-05-27 VITALS — BP 111/63 | HR 78 | Temp 97.7°F | Resp 18

## 2018-05-27 DIAGNOSIS — N185 Chronic kidney disease, stage 5: Secondary | ICD-10-CM | POA: Insufficient documentation

## 2018-05-27 DIAGNOSIS — D631 Anemia in chronic kidney disease: Secondary | ICD-10-CM

## 2018-05-27 LAB — POCT HEMOGLOBIN-HEMACUE: Hemoglobin: 7 g/dL — ABNORMAL LOW (ref 12.0–15.0)

## 2018-05-27 MED ORDER — EPOETIN ALFA-EPBX 10000 UNIT/ML IJ SOLN
40000.0000 [IU] | Freq: Once | INTRAMUSCULAR | Status: AC
  Administered 2018-05-27: 40000 [IU] via SUBCUTANEOUS
  Filled 2018-05-27: qty 4

## 2018-05-27 MED ORDER — EPOETIN ALFA-EPBX 40000 UNIT/ML IJ SOLN: 40000.0000 [IU] | INTRAMUSCULAR | Status: DC

## 2018-05-27 NOTE — Discharge Instructions (Signed)
Epoetin Alfa injection °What is this medicine? °EPOETIN ALFA (e POE e tin AL fa) helps your body make more red blood cells. This medicine is used to treat anemia caused by chronic kidney disease, cancer chemotherapy, or HIV-therapy. It may also be used before surgery if you have anemia. °This medicine may be used for other purposes; ask your health care provider or pharmacist if you have questions. °COMMON BRAND NAME(S): Epogen, Procrit, Retacrit °What should I tell my health care provider before I take this medicine? °They need to know if you have any of these conditions: °-cancer °-heart disease °-high blood pressure °-history of blood clots °-history of stroke °-low levels of folate, iron, or vitamin B12 in the blood °-seizures °-an unusual or allergic reaction to erythropoietin, albumin, benzyl alcohol, hamster proteins, other medicines, foods, dyes, or preservatives °-pregnant or trying to get pregnant °-breast-feeding °How should I use this medicine? °This medicine is for injection into a vein or under the skin. It is usually given by a health care professional in a hospital or clinic setting. °If you get this medicine at home, you will be taught how to prepare and give this medicine. Use exactly as directed. Take your medicine at regular intervals. Do not take your medicine more often than directed. °It is important that you put your used needles and syringes in a special sharps container. Do not put them in a trash can. If you do not have a sharps container, call your pharmacist or healthcare provider to get one. °A special MedGuide will be given to you by the pharmacist with each prescription and refill. Be sure to read this information carefully each time. °Talk to your pediatrician regarding the use of this medicine in children. While this drug may be prescribed for selected conditions, precautions do apply. °Overdosage: If you think you have taken too much of this medicine contact a poison control center  or emergency room at once. °NOTE: This medicine is only for you. Do not share this medicine with others. °What if I miss a dose? °If you miss a dose, take it as soon as you can. If it is almost time for your next dose, take only that dose. Do not take double or extra doses. °What may interact with this medicine? °Interactions have not been studied. °This list may not describe all possible interactions. Give your health care provider a list of all the medicines, herbs, non-prescription drugs, or dietary supplements you use. Also tell them if you smoke, drink alcohol, or use illegal drugs. Some items may interact with your medicine. °What should I watch for while using this medicine? °Your condition will be monitored carefully while you are receiving this medicine. °You may need blood work done while you are taking this medicine. °This medicine may cause a decrease in vitamin B6. You should make sure that you get enough vitamin B6 while you are taking this medicine. Discuss the foods you eat and the vitamins you take with your health care professional. °What side effects may I notice from receiving this medicine? °Side effects that you should report to your doctor or health care professional as soon as possible: °-allergic reactions like skin rash, itching or hives, swelling of the face, lips, or tongue °-seizures °-signs and symptoms of a blood clot such as breathing problems; changes in vision; chest pain; severe, sudden headache; pain, swelling, warmth in the leg; trouble speaking; sudden numbness or weakness of the face, arm or leg °-signs and symptoms of a stroke   like changes in vision; confusion; trouble speaking or understanding; severe headaches; sudden numbness or weakness of the face, arm or leg; trouble walking; dizziness; loss of balance or coordination °Side effects that usually do not require medical attention (report to your doctor or health care professional if they continue or are  bothersome): °-chills °-cough °-dizziness °-fever °-headaches °-joint pain °-muscle cramps °-muscle pain °-nausea, vomiting °-pain, redness, or irritation at site where injected °This list may not describe all possible side effects. Call your doctor for medical advice about side effects. You may report side effects to FDA at 1-800-FDA-1088. °Where should I keep my medicine? °Keep out of the reach of children. °Store in a refrigerator between 2 and 8 degrees C (36 and 46 degrees F). Do not freeze or shake. Throw away any unused portion if using a single-dose vial. Multi-dose vials can be kept in the refrigerator for up to 21 days after the initial dose. Throw away unused medicine. °NOTE: This sheet is a summary. It may not cover all possible information. If you have questions about this medicine, talk to your doctor, pharmacist, or health care provider. °© 2019 [employer] (2016-08-28 08:35:19) ° °

## 2018-06-01 ENCOUNTER — Encounter: Payer: Medicare Other | Admitting: Vascular Surgery

## 2018-06-01 ENCOUNTER — Encounter (HOSPITAL_COMMUNITY): Payer: Medicare Other

## 2018-06-01 ENCOUNTER — Other Ambulatory Visit: Payer: Self-pay

## 2018-06-01 DIAGNOSIS — D631 Anemia in chronic kidney disease: Secondary | ICD-10-CM

## 2018-06-01 DIAGNOSIS — N185 Chronic kidney disease, stage 5: Secondary | ICD-10-CM

## 2018-06-06 ENCOUNTER — Telehealth (HOSPITAL_COMMUNITY): Payer: Self-pay | Admitting: Rehabilitation

## 2018-06-06 NOTE — Telephone Encounter (Signed)
The above patient or their representative was contacted and gave the following answers to these questions:         Do you have any of the following symptoms? No  Fever                    Cough                   Shortness of breath  Do  you have any of the following other symptoms? No   muscle pain         vomiting,        diarrhea        rash         weakness        red eye        abdominal pain         bruising          bruising or bleeding              joint pain           severe headache    Have you been in contact with someone who was or has been sick in the past 2 weeks? No  Yes                 Unsure                         Unable to assess   Does the person that you were in contact with have any of the following symptoms?   Cough         shortness of breath           muscle pain         vomiting,            diarrhea            rash            weakness           fever            red eye           abdominal pain           bruising  or  bleeding                joint pain                severe headache               Have you  or someone you have been in contact with traveled internationally in th last month? No         If yes, which countries?   Have you  or someone you have been in contact with traveled outside [place] in th last month? No         If yes, which state and city?   COMMENTS OR ACTION PLAN FOR THIS PATIENT:  Pt to wear mask to appt.

## 2018-06-07 ENCOUNTER — Ambulatory Visit: Payer: Medicare Other | Admitting: Vascular Surgery

## 2018-06-07 ENCOUNTER — Encounter: Payer: Self-pay | Admitting: *Deleted

## 2018-06-07 ENCOUNTER — Other Ambulatory Visit: Payer: Self-pay | Admitting: *Deleted

## 2018-06-07 ENCOUNTER — Other Ambulatory Visit: Payer: Self-pay

## 2018-06-07 ENCOUNTER — Ambulatory Visit (HOSPITAL_COMMUNITY)
Admission: RE | Admit: 2018-06-07 | Discharge: 2018-06-07 | Disposition: A | Discharge status: Home / Self Care | Payer: Medicare Other | Source: Ambulatory Visit | Attending: Family | Admitting: Family

## 2018-06-07 ENCOUNTER — Ambulatory Visit (INDEPENDENT_AMBULATORY_CARE_PROVIDER_SITE_OTHER)
Admission: RE | Admit: 2018-06-07 | Discharge: 2018-06-07 | Disposition: A | Discharge status: Home / Self Care | Payer: Medicare Other | Source: Ambulatory Visit | Attending: Family | Admitting: Family

## 2018-06-07 ENCOUNTER — Encounter: Payer: Self-pay | Admitting: Vascular Surgery

## 2018-06-07 VITALS — BP 172/86 | HR 60 | Temp 97.0°F | Resp 14 | Ht 66.0 in | Wt 107.0 lb

## 2018-06-07 DIAGNOSIS — D631 Anemia in chronic kidney disease: Secondary | ICD-10-CM | POA: Diagnosis not present

## 2018-06-07 DIAGNOSIS — N185 Chronic kidney disease, stage 5: Secondary | ICD-10-CM | POA: Diagnosis not present

## 2018-06-07 NOTE — Progress Notes (Signed)
Patient name: Kimberly Frost MRN: [id number] DOB: June 02, 1950 Sex: female  REASON FOR CONSULT: Evaluate for new AVF access  HPI: Kimberly Frost is a 68 y.o. female, with history of stage V chronic kidney disease secondary to hypertension that presents for new dialysis access evaluation.  Patient states she is right-handed and has never had access previously.  She is followed by Dr. Hollie Frost as her nephrologist.  She states this all comes as a surprise to her and she did not realize that she was going to need a fistula this quickly.  She states no previous chest catheters and/or ICDs.  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  No numbness or tingling in either hand.  States she is getting injections for her anemia related to her chronic kidney disease.  Past Medical History:  Diagnosis Date  . Hepatitis   . Hypertension     History reviewed. No pertinent surgical history.  Family History  Problem Relation Age of Onset  . Diabetes Mother   . Hypertension Mother   . Bone cancer Father   . Kidney cancer Maternal Grandmother   . Stroke Brother   . Diabetes Brother     SOCIAL HISTORY: Social History   Socioeconomic History  . Marital status: Single    Spouse name: Not on file  . Number of children: Not on file  . Years of education: Not on file  . Highest education level: Not on file  Occupational History  . Not on file  Social Needs  . Financial resource strain: Not on file  . Food insecurity:    Worry: Not on file    Inability: Not on file  . Transportation needs:    Medical: Not on file    Non-medical: Not on file  Tobacco Use  . Smoking status: Former Research scientist (life sciences)  . Smokeless tobacco: Never Used  . Tobacco comment: 30+ years ago  Substance and Sexual Activity  . Alcohol use: Yes    Alcohol/week: 6.0 standard drinks    Types: 6 Cans of beer per week    Comment: occasional  . Drug use: Yes    Types: Marijuana  . Sexual activity: Not  Currently  Lifestyle  . Physical activity:    Days per week: Not on file    Minutes per session: Not on file  . Stress: Not on file  Relationships  . Social connections:    Talks on phone: Not on file    Gets together: Not on file    Attends religious service: Not on file    Active member of club or organization: Not on file    Attends meetings of clubs or organizations: Not on file    Relationship status: Not on file  . Intimate partner violence:    Fear of current or ex partner: Not on file    Emotionally abused: Not on file    Physically abused: Not on file    Forced sexual activity: Not on file  Other Topics Concern  . Not on file  Social History Narrative  . Not on file    No Known Allergies  Current Outpatient Medications  Medication Sig Dispense Refill  . amLODipine (NORVASC) 10 MG tablet Take 1 tablet (10 mg total) by mouth daily. 30 tablet 3  . calcitRIOL (ROCALTROL) 0.25 MCG capsule     . furosemide (LASIX) 20 MG tablet Take 3 tablets (60 mg total) by mouth daily. [place]  tablet 3  . labetalol (NORMODYNE) 300 MG tablet Take 1 tablet (300 mg total) by mouth 2 (two) times daily. 60 tablet 3  . lisinopril-hydrochlorothiazide (ZESTORETIC) 20-25 MG tablet      No current facility-administered medications for this visit.     REVIEW OF SYSTEMS:  [X]  denotes positive finding, [ ]  denotes negative finding Cardiac  Comments:  Chest pain or chest pressure:    Shortness of breath upon exertion:    Short of breath when lying flat:    Irregular heart rhythm:        Vascular    Pain in calf, thigh, or hip brought on by ambulation:    Pain in feet at night that wakes you up from your sleep:     Blood clot in your veins:    Leg swelling:         Pulmonary    Oxygen at home:    Productive cough:     Wheezing:         Neurologic    Sudden weakness in arms or legs:     Sudden numbness in arms or legs:     Sudden onset of difficulty speaking or slurred speech:     Temporary loss of vision in one eye:     Problems with dizziness:         Gastrointestinal    Blood in stool:     Vomited blood:         Genitourinary    Burning when urinating:     Blood in urine:        Psychiatric    Major depression:         Hematologic    Bleeding problems:    Problems with blood clotting too easily:    Anemia x   Skin    Rashes or ulcers:        Constitutional    Fever or chills:      PHYSICAL EXAM: Vitals:   06/07/18 1000  BP: (!) 172/86  Pulse: 60  Resp: 14  Temp: (!) 97 F (36.1 C)  TempSrc: Oral  SpO2: 100%  Weight: 107 lb (48.5 kg)  Height: 5\' 6"  (1.676 m)    GENERAL: The patient is a well-nourished female, in no acute distress. The vital signs are documented above. CARDIAC: There is a regular rate and rhythm.  VASCULAR:  2+ radial and brachial pulse bilateral upper extremities No upper extremity tissue loss Motor and sensory intact to both hands PULMONARY: There is good air exchange bilaterally without wheezing or rales. ABDOMEN: Soft and non-tender with normal pitched bowel sounds.  MUSCULOSKELETAL: There are no major deformities or cyanosis. NEUROLOGIC: No focal weakness or paresthesias are detected. SKIN: There are no ulcers or rashes noted. PSYCHIATRIC: The patient has a normal affect.  DATA:   Independently reviewed her noninvasive imaging and she has triphasic waveforms in both upper extremities of the arteries.  Both of her cephalic veins look small but she has adequate basilic veins bilaterally.  Assessment/Plan:  Discussed with Kimberly Frost plan for AV fistula placement given stage V chronic kidney disease.  Would favor left arm access placement given that she is right-handed and was still working prior to being laid off last month.  In review of her vein mapping it appears her basilic vein will be more than adequate given that her cephalic vein is very small bilaterally.  Discussed this is usually done in two stages and  after stage one would  repeat a fistula duplex and given adequate maturation will take her for stage two in about one month.  I offered her surgery as early as this Thursday but she states due to her recent unemployment she cannot have surgery before next week due to family issues.  We will schedule for next Thursday with me and I reviewed risks and benefits with the patient.   Marty Heck, MD [facility] Office: [phone] Pager: [phone]

## 2018-06-10 ENCOUNTER — Other Ambulatory Visit: Payer: Self-pay | Admitting: *Deleted

## 2018-06-10 ENCOUNTER — Other Ambulatory Visit: Payer: Self-pay

## 2018-06-10 ENCOUNTER — Ambulatory Visit (HOSPITAL_COMMUNITY)
Admission: RE | Admit: 2018-06-10 | Discharge: 2018-06-10 | Disposition: A | Discharge status: Home / Self Care | Payer: Medicare Other | Source: Ambulatory Visit | Attending: Nephrology | Admitting: Nephrology

## 2018-06-10 VITALS — BP 143/76 | HR 72 | Temp 97.4°F | Resp 20

## 2018-06-10 DIAGNOSIS — N185 Chronic kidney disease, stage 5: Secondary | ICD-10-CM | POA: Diagnosis not present

## 2018-06-10 DIAGNOSIS — D631 Anemia in chronic kidney disease: Secondary | ICD-10-CM

## 2018-06-10 MED ORDER — EPOETIN ALFA-EPBX 10000 UNIT/ML IJ SOLN
40000.0000 [IU] | Freq: Once | INTRAMUSCULAR | Status: AC
  Administered 2018-06-10: 40000 [IU] via SUBCUTANEOUS
  Filled 2018-06-10: qty 4

## 2018-06-13 LAB — POCT HEMOGLOBIN-HEMACUE: Hemoglobin: 7.8 g/dL — ABNORMAL LOW (ref 12.0–15.0)

## 2018-06-14 ENCOUNTER — Other Ambulatory Visit: Payer: Self-pay

## 2018-06-14 ENCOUNTER — Other Ambulatory Visit (HOSPITAL_COMMUNITY)
Admission: RE | Admit: 2018-06-14 | Discharge: 2018-06-14 | Disposition: A | Discharge status: Home / Self Care | Payer: Medicare Other | Source: Ambulatory Visit | Attending: Vascular Surgery | Admitting: Vascular Surgery

## 2018-06-14 DIAGNOSIS — Z1159 Encounter for screening for other viral diseases: Secondary | ICD-10-CM | POA: Diagnosis not present

## 2018-06-14 LAB — SARS CORONAVIRUS 2 BY RT PCR (HOSPITAL ORDER, PERFORMED IN ~~LOC~~ HOSPITAL LAB): SARS Coronavirus 2: NEGATIVE

## 2018-06-15 ENCOUNTER — Encounter (HOSPITAL_COMMUNITY): Payer: Self-pay | Admitting: *Deleted

## 2018-06-15 NOTE — Progress Notes (Signed)
Pt denies SOB, chest pain, and being under the care of a cardiologist. Pt denies having a cardiac cath. Pt denies having an EKG within the last year. Pt made aware to stop taking vitamins, fish oil and herbal medications. Do not take any NSAIDs ie: Ibuprofen, Advil, Naproxen (Aleve), Motrin, BC and Goody Powder. Pt stated that she was tested for COVID-19 on 06/14/2018 but denies that Joe, Caregiver tested positive for COVID-19.  Coronavirus Screening  Pt denies that she and Joe experienced the following symptoms:  Cough yes/no: No Fever (>100.28F)  yes/no: No Runny nose yes/no: No Sore throat yes/no: No Difficulty breathing/shortness of breath  yes/no: No  Have you or a family member traveled in the last 14 days and where? yes/no: No  Pt reminded that hospital visitation restrictions are in effect and the importance of the restrictions.  Pt verbalized understanding of all pre-op instructions.

## 2018-06-16 ENCOUNTER — Encounter (HOSPITAL_COMMUNITY): Payer: Self-pay

## 2018-06-16 ENCOUNTER — Other Ambulatory Visit: Payer: Self-pay

## 2018-06-16 ENCOUNTER — Ambulatory Visit (HOSPITAL_COMMUNITY): Payer: Medicare Other | Admitting: Anesthesiology

## 2018-06-16 ENCOUNTER — Encounter (HOSPITAL_COMMUNITY)
Admission: RE | Disposition: A | Discharge status: Home / Self Care | Payer: Self-pay | Source: Ambulatory Visit | Attending: Vascular Surgery

## 2018-06-16 ENCOUNTER — Ambulatory Visit (HOSPITAL_COMMUNITY)
Admission: RE | Admit: 2018-06-16 | Discharge: 2018-06-16 | Disposition: A | Discharge status: Home / Self Care | Payer: Medicare Other | Source: Ambulatory Visit | Attending: Vascular Surgery | Admitting: Vascular Surgery

## 2018-06-16 DIAGNOSIS — I120 Hypertensive chronic kidney disease with stage 5 chronic kidney disease or end stage renal disease: Secondary | ICD-10-CM | POA: Diagnosis not present

## 2018-06-16 DIAGNOSIS — D631 Anemia in chronic kidney disease: Secondary | ICD-10-CM | POA: Diagnosis not present

## 2018-06-16 DIAGNOSIS — D649 Anemia, unspecified: Secondary | ICD-10-CM | POA: Insufficient documentation

## 2018-06-16 DIAGNOSIS — Z09 Encounter for follow-up examination after completed treatment for conditions other than malignant neoplasm: Secondary | ICD-10-CM | POA: Diagnosis not present

## 2018-06-16 DIAGNOSIS — Z992 Dependence on renal dialysis: Secondary | ICD-10-CM | POA: Diagnosis not present

## 2018-06-16 DIAGNOSIS — Z87891 Personal history of nicotine dependence: Secondary | ICD-10-CM | POA: Diagnosis not present

## 2018-06-16 DIAGNOSIS — Z79899 Other long term (current) drug therapy: Secondary | ICD-10-CM | POA: Insufficient documentation

## 2018-06-16 DIAGNOSIS — N185 Chronic kidney disease, stage 5: Secondary | ICD-10-CM | POA: Diagnosis not present

## 2018-06-16 HISTORY — DX: Presence of dental prosthetic device (complete) (partial): Z97.2

## 2018-06-16 HISTORY — DX: Chronic kidney disease, unspecified: N18.9

## 2018-06-16 HISTORY — DX: Personal history of other medical treatment: Z92.89

## 2018-06-16 HISTORY — PX: AV FISTULA PLACEMENT: SHX1204

## 2018-06-16 HISTORY — DX: Edema, unspecified: R60.9

## 2018-06-16 LAB — POCT I-STAT 4, (NA,K, GLUC, HGB,HCT)
Glucose, Bld: 95 mg/dL (ref 70–99)
HCT: 25 % — ABNORMAL LOW (ref 36.0–46.0)
Hemoglobin: 8.5 g/dL — ABNORMAL LOW (ref 12.0–15.0)
Potassium: 4.4 mmol/L (ref 3.5–5.1)
Sodium: 142 mmol/L (ref 135–145)

## 2018-06-16 SURGERY — ARTERIOVENOUS (AV) FISTULA CREATION
Anesthesia: Monitor Anesthesia Care | Site: Arm Lower | Laterality: Left

## 2018-06-16 MED ORDER — GLYCOPYRROLATE PF 0.2 MG/ML IJ SOSY
PREFILLED_SYRINGE | INTRAMUSCULAR | Status: DC | PRN
  Administered 2018-06-16: .2 mg via INTRAVENOUS

## 2018-06-16 MED ORDER — FENTANYL CITRATE (PF) 250 MCG/5ML IJ SOLN
INTRAMUSCULAR | Status: AC
  Filled 2018-06-16: qty 5

## 2018-06-16 MED ORDER — CEFAZOLIN SODIUM-DEXTROSE 2-4 GM/100ML-% IV SOLN
INTRAVENOUS | Status: AC
  Filled 2018-06-16: qty 100

## 2018-06-16 MED ORDER — FENTANYL CITRATE (PF) 100 MCG/2ML IJ SOLN: 25.0000 ug | INTRAMUSCULAR | Status: DC | PRN

## 2018-06-16 MED ORDER — 0.9 % SODIUM CHLORIDE (POUR BTL) OPTIME
TOPICAL | Status: DC | PRN
  Administered 2018-06-16: 13:00:00 1000 mL

## 2018-06-16 MED ORDER — LIDOCAINE 2% (20 MG/ML) 5 ML SYRINGE
INTRAMUSCULAR | Status: AC
  Filled 2018-06-16: qty 5

## 2018-06-16 MED ORDER — SODIUM CHLORIDE 0.9 % IV SOLN
INTRAVENOUS | Status: DC | PRN
  Administered 2018-06-16: 13:00:00

## 2018-06-16 MED ORDER — FENTANYL CITRATE (PF) 100 MCG/2ML IJ SOLN
INTRAMUSCULAR | Status: DC | PRN
  Administered 2018-06-16 (×2): 25 ug via INTRAVENOUS

## 2018-06-16 MED ORDER — OXYCODONE-ACETAMINOPHEN 5-325 MG PO TABS: 1.0000 | ORAL_TABLET | Freq: Four times a day (QID) | ORAL | 0 refills | Status: DC | PRN

## 2018-06-16 MED ORDER — MEPERIDINE HCL 25 MG/ML IJ SOLN: 6.2500 mg | INTRAMUSCULAR | Status: DC | PRN

## 2018-06-16 MED ORDER — PROMETHAZINE HCL 25 MG/ML IJ SOLN: 6.2500 mg | INTRAMUSCULAR | Status: DC | PRN

## 2018-06-16 MED ORDER — SODIUM CHLORIDE 0.9 % IV SOLN
INTRAVENOUS | Status: DC
  Administered 2018-06-16: 11:00:00 via INTRAVENOUS

## 2018-06-16 MED ORDER — ONDANSETRON HCL 4 MG/2ML IJ SOLN
INTRAMUSCULAR | Status: AC
  Filled 2018-06-16: qty 2

## 2018-06-16 MED ORDER — PROPOFOL 500 MG/50ML IV EMUL
INTRAVENOUS | Status: DC | PRN
  Administered 2018-06-16: 100 ug/kg/min via INTRAVENOUS

## 2018-06-16 MED ORDER — SODIUM CHLORIDE 0.9 % IV SOLN
INTRAVENOUS | Status: AC
  Filled 2018-06-16: qty 1.2

## 2018-06-16 MED ORDER — CEFAZOLIN SODIUM-DEXTROSE 2-4 GM/100ML-% IV SOLN
2.0000 g | INTRAVENOUS | Status: AC
  Administered 2018-06-16: 2 g via INTRAVENOUS

## 2018-06-16 MED ORDER — SODIUM CHLORIDE 0.9 % IV SOLN
INTRAVENOUS | Status: DC
  Administered 2018-06-16: 12:00:00 via INTRAVENOUS

## 2018-06-16 MED ORDER — HEPARIN SODIUM (PORCINE) 1000 UNIT/ML IJ SOLN
INTRAMUSCULAR | Status: AC
  Filled 2018-06-16: qty 1

## 2018-06-16 MED ORDER — ONDANSETRON HCL 4 MG/2ML IJ SOLN
INTRAMUSCULAR | Status: DC | PRN
  Administered 2018-06-16: 4 mg via INTRAVENOUS

## 2018-06-16 MED ORDER — GLYCOPYRROLATE PF 0.2 MG/ML IJ SOSY
PREFILLED_SYRINGE | INTRAMUSCULAR | Status: AC
  Filled 2018-06-16: qty 1

## 2018-06-16 MED ORDER — SODIUM CHLORIDE 0.9 % IV SOLN
INTRAVENOUS | Status: DC | PRN
  Administered 2018-06-16: 25 ug/min via INTRAVENOUS

## 2018-06-16 MED ORDER — HEPARIN SODIUM (PORCINE) 1000 UNIT/ML IJ SOLN
INTRAMUSCULAR | Status: DC | PRN
  Administered 2018-06-16: 3000 [IU] via INTRAVENOUS

## 2018-06-16 MED ORDER — LIDOCAINE 2% (20 MG/ML) 5 ML SYRINGE
INTRAMUSCULAR | Status: DC | PRN
  Administered 2018-06-16: 40 mg via INTRAVENOUS

## 2018-06-16 MED ORDER — LIDOCAINE HCL 1 % IJ SOLN
INTRAMUSCULAR | Status: DC | PRN
  Administered 2018-06-16: 6 mL

## 2018-06-16 MED ORDER — LIDOCAINE HCL (PF) 1 % IJ SOLN
INTRAMUSCULAR | Status: AC
  Filled 2018-06-16: qty 30

## 2018-06-16 SURGICAL SUPPLY — 41 items
ARMBAND PINK RESTRICT EXTREMIT (MISCELLANEOUS) ×4 IMPLANT
CANISTER SUCT 3000ML PPV (MISCELLANEOUS) ×2 IMPLANT
CLIP VESOCCLUDE MED 6/CT (CLIP) ×2 IMPLANT
CLIP VESOCCLUDE SM WIDE 6/CT (CLIP) ×2 IMPLANT
COVER PROBE W GEL 5X96 (DRAPES) ×2 IMPLANT
COVER WAND RF STERILE (DRAPES) ×2 IMPLANT
DECANTER SPIKE VIAL GLASS SM (MISCELLANEOUS) ×2 IMPLANT
DERMABOND ADVANCED (GAUZE/BANDAGES/DRESSINGS) ×1
DERMABOND ADVANCED .7 DNX12 (GAUZE/BANDAGES/DRESSINGS) ×1 IMPLANT
ELECT REM PT RETURN 9FT ADLT (ELECTROSURGICAL) ×2
ELECTRODE REM PT RTRN 9FT ADLT (ELECTROSURGICAL) ×1 IMPLANT
GLOVE BIO SURGEON STRL SZ 6.5 (GLOVE) ×2 IMPLANT
GLOVE BIO SURGEON STRL SZ7 (GLOVE) ×2 IMPLANT
GLOVE BIO SURGEON STRL SZ7.5 (GLOVE) ×2 IMPLANT
GLOVE BIOGEL M 7.0 STRL (GLOVE) ×2 IMPLANT
GLOVE BIOGEL PI IND STRL 6.5 (GLOVE) ×1 IMPLANT
GLOVE BIOGEL PI IND STRL 7.0 (GLOVE) ×1 IMPLANT
GLOVE BIOGEL PI IND STRL 7.5 (GLOVE) ×1 IMPLANT
GLOVE BIOGEL PI IND STRL 8 (GLOVE) ×1 IMPLANT
GLOVE BIOGEL PI INDICATOR 6.5 (GLOVE) ×1
GLOVE BIOGEL PI INDICATOR 7.0 (GLOVE) ×1
GLOVE BIOGEL PI INDICATOR 7.5 (GLOVE) ×1
GLOVE BIOGEL PI INDICATOR 8 (GLOVE) ×1
GOWN STRL REUS W/ TWL LRG LVL3 (GOWN DISPOSABLE) ×2 IMPLANT
GOWN STRL REUS W/ TWL XL LVL3 (GOWN DISPOSABLE) ×4 IMPLANT
GOWN STRL REUS W/TWL LRG LVL3 (GOWN DISPOSABLE) ×2
GOWN STRL REUS W/TWL XL LVL3 (GOWN DISPOSABLE) ×4
HEMOSTAT SPONGE AVITENE ULTRA (HEMOSTASIS) IMPLANT
KIT BASIN OR (CUSTOM PROCEDURE TRAY) ×2 IMPLANT
KIT TURNOVER KIT B (KITS) ×2 IMPLANT
NS IRRIG 1000ML POUR BTL (IV SOLUTION) ×2 IMPLANT
PACK CV ACCESS (CUSTOM PROCEDURE TRAY) ×2 IMPLANT
PAD ARMBOARD 7.5X6 YLW CONV (MISCELLANEOUS) ×4 IMPLANT
SUT MNCRL AB 4-0 PS2 18 (SUTURE) ×2 IMPLANT
SUT PROLENE 6 0 BV (SUTURE) ×2 IMPLANT
SUT PROLENE 7 0 BV 1 (SUTURE) IMPLANT
SUT VIC AB 3-0 SH 27 (SUTURE) ×1
SUT VIC AB 3-0 SH 27X BRD (SUTURE) ×1 IMPLANT
TOWEL GREEN STERILE (TOWEL DISPOSABLE) ×2 IMPLANT
UNDERPAD 30X30 (UNDERPADS AND DIAPERS) ×2 IMPLANT
WATER STERILE IRR 1000ML POUR (IV SOLUTION) ×2 IMPLANT

## 2018-06-16 NOTE — Anesthesia Procedure Notes (Signed)
Procedure Name: MAC Date/Time: 06/16/2018 12:20 PM Performed by: Alain Marion, CRNA Pre-anesthesia Checklist: Patient identified, Emergency Drugs available, Suction available, Patient being monitored and Timeout performed Oxygen Delivery Method: Simple face mask Placement Confirmation: positive ETCO2

## 2018-06-16 NOTE — Op Note (Signed)
OPERATIVE NOTE   PROCEDURE: 1. left brachiocephalic arteriovenous fistula placement  PRE-OPERATIVE DIAGNOSIS: chronic kidney disease stage V  POST-OPERATIVE DIAGNOSIS: same as above   SURGEON: Marty Heck, MD  ASSISTANT(S): Roxy Horseman, PA  ANESTHESIA: MAC  ESTIMATED BLOOD LOSS: Minimal  FINDING(S): 1.  Cephalic vein: 3 mm, acceptable 2.  Brachial artery: 4 mm, disease free 3.  Venous outflow: palpable thrill  4.  Radial flow: palpable radial pulse  SPECIMEN(S):  none  INDICATIONS:   Kimberly Frost is a 68 y.o. female who presents with stage V CKD that needs permanent hemodialysis access.  I had discussed likely basilic vein fistula based on vein mapping, but her cephalic vein was much larger above the elbow than vein mapping suggested.  As a result, I placed a brachiocephalic fistula. The patient is aware the risks include but are not limited to: bleeding, infection, steal syndrome, nerve damage, ischemic monomelic neuropathy, failure to mature, and need for additional procedures.  The patient is aware of the risks of the procedure and elects to proceed forward.   DESCRIPTION: After full informed written consent was obtained from the patient, the patient was brought back to the operating room and placed supine upon the operating table.  Prior to induction, the patient received IV antibiotics.   After obtaining adequate anesthesia, the patient was then prepped and draped in the standard fashion for a left arm access procedure.  I turned my attention first to identifying the patient's cephalic vein and brachial artery.  Using SonoSite guidance, the location of these vessels were marked out on the skin.     At this point, I injected local anesthetic to obtain a field block of the antecubitum.  In total, I injected about 10 mL of 1% lidocaine without epinephrine.  I made a transeverse incision just above the level of the antecubitum and dissected through the  subcutaneous tissue and fascia to gain exposure of the brachial artery.  This was noted to be 4 mm in diameter externally.  This was dissected out proximally and distally and controlled with vessel loops .  I then dissected out the cephalic vein.  This was noted to be 3 mm in diameter externally at the elbow.  The distal segment of the vein was ligated with a  2-0 silk, and the vein was transected.  The proximal segment was interrogated with serial dilators.  The vein accepted up to a 4 mm dilator without any difficulty.  I then instilled the heparinized saline into the vein and clamped it.  At this point, I reset my exposure of the brachial artery and placed the artery under tension proximally and distally.  I made an arteriotomy with a #11 blade, and then I extended the arteriotomy with a Potts scissor.  I injected heparinized saline proximal and distal to this arteriotomy.  The vein was then sewn to the artery in an end-to-side configuration with a running stitch of 6-0 Prolene.  Prior to completing this anastomosis, I allowed the vein and artery to backbleed.  There was no evidence of clot from any vessels.  I completed the anastomosis in the usual fashion and then released all vessel loops and clamps.    There was a palpable thrill in the venous outflow, and there was a palpable radial pulse.  At this point, I irrigated out the surgical wound.  There was no further active bleeding.  The subcutaneous tissue was reapproximated with a running stitch of 3-0 Vicryl.  The skin was then reapproximated with a running subcuticular stitch of 4-0 Monocryl.  The skin was then cleaned, dried, and reinforced with Dermabond.  The patient tolerated this procedure well.   COMPLICATIONS: None  CONDITION: Stable  Marty Heck, MD [facility] Office: [phone] Pager: [phone]  06/16/2018, 1:21 PM

## 2018-06-16 NOTE — Transfer of Care (Signed)
Immediate Anesthesia Transfer of Care Note  Patient: Kimberly Frost  Procedure(s) Performed: Left Arm ARTERIOVENOUS (AV) FISTULA CREATION (Left Arm Lower)  Patient Location: PACU  Anesthesia Type:MAC  Level of Consciousness: awake, alert  and oriented  Airway & Oxygen Therapy: Patient Spontanous Breathing and Patient connected to face mask oxygen  Post-op Assessment: Report given to RN and Post -op Vital signs reviewed and stable  Post vital signs: Reviewed and stable  Last Vitals:  Vitals Value Taken Time  BP 107/62 06/16/2018  1:34 PM  Temp    Pulse 78 06/16/2018  1:35 PM  Resp 16 06/16/2018  1:35 PM  SpO2 95 % 06/16/2018  1:35 PM  Vitals shown include unvalidated device data.  Last Pain:  Vitals:   06/16/18 1033  TempSrc:   PainSc: 0-No pain         Complications: No apparent anesthesia complications

## 2018-06-16 NOTE — H&P (Signed)
History and Physical Interval Note:  06/16/2018 11:38 AM  Kimberly Frost  has presented today for surgery, with the diagnosis of chronic kidney disease stage 5.  The various methods of treatment have been discussed with the patient and family. After consideration of risks, benefits and other options for treatment, the patient has consented to  Procedure(s): ARTERIOVENOUS (AV) FISTULA CREATION VERSUS GRAFT (Left) as a surgical intervention.  The patient's history has been reviewed, patient examined, no change in status, stable for surgery.  I have reviewed the patient's chart and labs.  Questions were answered to the patient's satisfaction.    Left arm AVF vs graft  Kimberly Frost  Patient name: Kimberly Frost           MRN: [id number]        DOB: Mar 29, 1950          Sex: female  REASON FOR CONSULT: Evaluate for new AVF access  HPI: Kimberly Frost is a 69 y.o. female, with history of stage V chronic kidney disease secondary to hypertension that presents for new dialysis access evaluation.  Patient states she is right-handed and has never had access previously.  She is followed by Dr. Hollie Salk as her nephrologist.  She states this all comes as a surprise to her and she did not realize that she was going to need a fistula this quickly.  She states no previous chest catheters and/or ICDs.  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  No numbness or tingling in either hand.  States she is getting injections for her anemia related to her chronic kidney disease.      Past Medical History:  Diagnosis Date  . Hepatitis   . Hypertension     History reviewed. No pertinent surgical history.       Family History  Problem Relation Age of Onset  . Diabetes Mother   . Hypertension Mother   . Bone cancer Father   . Kidney cancer Maternal Grandmother   . Stroke Brother   . Diabetes Brother     SOCIAL HISTORY: Social History        Socioeconomic  History  . Marital status: Single    Spouse name: Not on file  . Number of children: Not on file  . Years of education: Not on file  . Highest education level: Not on file  Occupational History  . Not on file  Social Needs  . Financial resource strain: Not on file  . Food insecurity:    Worry: Not on file    Inability: Not on file  . Transportation needs:    Medical: Not on file    Non-medical: Not on file  Tobacco Use  . Smoking status: Former Research scientist (life sciences)  . Smokeless tobacco: Never Used  . Tobacco comment: 30+ years ago  Substance and Sexual Activity  . Alcohol use: Yes    Alcohol/week: 6.0 standard drinks    Types: 6 Cans of beer per week    Comment: occasional  . Drug use: Yes    Types: Marijuana  . Sexual activity: Not Currently  Lifestyle  . Physical activity:    Days per week: Not on file    Minutes per session: Not on file  . Stress: Not on file  Relationships  . Social connections:    Talks on phone: Not on file    Gets together: Not on file    Attends religious service: Not on  file    Active member of club or organization: Not on file    Attends meetings of clubs or organizations: Not on file    Relationship status: Not on file  . Intimate partner violence:    Fear of current or ex partner: Not on file    Emotionally abused: Not on file    Physically abused: Not on file    Forced sexual activity: Not on file  Other Topics Concern  . Not on file  Social History Narrative  . Not on file    No Known Allergies        Current Outpatient Medications  Medication Sig Dispense Refill  . amLODipine (NORVASC) 10 MG tablet Take 1 tablet (10 mg total) by mouth daily. 30 tablet 3  . calcitRIOL (ROCALTROL) 0.25 MCG capsule     . furosemide (LASIX) 20 MG tablet Take 3 tablets (60 mg total) by mouth daily. 30 tablet 3  . labetalol (NORMODYNE) 300 MG tablet Take 1 tablet (300 mg total) by mouth 2 (two) times daily. 60  tablet 3  . lisinopril-hydrochlorothiazide (ZESTORETIC) 20-25 MG tablet      No current facility-administered medications for this visit.     REVIEW OF SYSTEMS:  [X]  denotes positive finding, [ ]  denotes negative finding Cardiac  Comments:  Chest pain or chest pressure:    Shortness of breath upon exertion:    Short of breath when lying flat:    Irregular heart rhythm:        Vascular    Pain in calf, thigh, or hip brought on by ambulation:    Pain in feet at night that wakes you up from your sleep:     Blood clot in your veins:    Leg swelling:         Pulmonary    Oxygen at home:    Productive cough:     Wheezing:         Neurologic    Sudden weakness in arms or legs:     Sudden numbness in arms or legs:     Sudden onset of difficulty speaking or slurred speech:    Temporary loss of vision in one eye:     Problems with dizziness:         Gastrointestinal    Blood in stool:     Vomited blood:         Genitourinary    Burning when urinating:     Blood in urine:        Psychiatric    Major depression:         Hematologic    Bleeding problems:    Problems with blood clotting too easily:    Anemia x   Skin    Rashes or ulcers:        Constitutional    Fever or chills:      PHYSICAL EXAM:    Vitals:   06/07/18 1000  BP: (!) 172/86  Pulse: 60  Resp: 14  Temp: (!) 97 F (36.1 C)  TempSrc: Oral  SpO2: 100%  Weight: 107 lb (48.5 kg)  Height: 5\' 6"  (1.676 m)    GENERAL: The patient is a well-nourished female, in no acute distress. The vital signs are documented above. CARDIAC: There is a regular rate and rhythm.  VASCULAR:  2+ radial and brachial pulse bilateral upper extremities No upper extremity tissue loss Motor and sensory intact to both hands PULMONARY: There is good air exchange bilaterally  without wheezing or rales. ABDOMEN: Soft and  non-tender with normal pitched bowel sounds.  MUSCULOSKELETAL: There are no major deformities or cyanosis. NEUROLOGIC: No focal weakness or paresthesias are detected. SKIN: There are no ulcers or rashes noted. PSYCHIATRIC: The patient has a normal affect.  DATA:   Independently reviewed her noninvasive imaging and she has triphasic waveforms in both upper extremities of the arteries.  Both of her cephalic veins look small but she has adequate basilic veins bilaterally.  Assessment/Plan:  Discussed with Ms. Hua plan for AV fistula placement given stage V chronic kidney disease.  Would favor left arm access placement given that she is right-handed and was still working prior to being laid off last month.  In review of her vein mapping it appears her basilic vein will be more than adequate given that her cephalic vein is very small bilaterally.  Discussed this is usually done in two stages and after stage one would repeat a fistula duplex and given adequate maturation will take her for stage two in about one month.  I offered her surgery as early as this Thursday but she states due to her recent unemployment she cannot have surgery before next week due to family issues.  We will schedule for next Thursday with me and I reviewed risks and benefits with the patient.   Kimberly Heck, MD [facility] Office: [phone] Pager: [phone]

## 2018-06-16 NOTE — Discharge Instructions (Signed)
° °  [facility] ° °Discharge Instructions ° °AV Fistula or Graft Surgery for Dialysis Access ° °Please refer to the following instructions for your post-procedure care. Your surgeon or physician assistant will discuss any changes with you. ° °Activity ° °You may drive the day following your surgery, if you are comfortable and no longer taking prescription pain medication. Resume full activity as the soreness in your incision resolves. ° °Bathing/Showering ° °You may shower after you go home. Keep your incision dry for 48 hours. Do not soak in a bathtub, hot tub, or swim until the incision heals completely. You may not shower if you have a hemodialysis catheter. ° °Incision Care ° °Clean your incision with mild soap and water after 48 hours. Pat the area dry with a clean towel. You do not need a bandage unless otherwise instructed. Do not apply any ointments or creams to your incision. You may have skin glue on your incision. Do not peel it off. It will come off on its own in about one week. Your arm may swell a bit after surgery. To reduce swelling use pillows to elevate your arm so it is above your heart. Your doctor will tell you if you need to lightly wrap your arm with an ACE bandage. ° °Diet ° °Resume your normal diet. There are not special food restrictions following this procedure. In order to heal from your surgery, it is CRITICAL to get adequate nutrition. Your body requires vitamins, minerals, and protein. Vegetables are the best source of vitamins and minerals. Vegetables also provide the perfect balance of protein. Processed food has little nutritional value, so try to avoid this. ° °Medications ° °Resume taking all of your medications. If your incision is causing pain, you may take over-the counter pain relievers such as acetaminophen (Tylenol). If you were prescribed a stronger pain medication, please be aware these medications can cause nausea and constipation. Prevent  nausea by taking the medication with a snack or meal. Avoid constipation by drinking plenty of fluids and eating foods with high amount of fiber, such as fruits, vegetables, and grains. Do not take Tylenol if you are taking prescription pain medications. ° ° ° ° °Follow up °Your surgeon may want to see you in the office following your access surgery. If so, this will be arranged at the time of your surgery. ° °Please call us immediately for any of the following conditions: ° °Increased pain, redness, drainage (pus) from your incision site °Fever of 101 degrees or higher °Severe or worsening pain at your incision site °Hand pain or numbness. ° °Reduce your risk of vascular disease: ° °Stop smoking. If you would like help, call [facility] at 1-800-QUIT-NOW (1-800-784-8669) or [place] at 336-586-4000 ° °Manage your cholesterol °Maintain a desired weight °Control your diabetes °Keep your blood pressure down ° °Dialysis ° °It will take several weeks to several months for your new dialysis access to be ready for use. Your surgeon will determine when it is OK to use it. Your nephrologist will continue to direct your dialysis. You can continue to use your Permcath until your new access is ready for use. ° °If you have any questions, please call the office at [phone]. ° °

## 2018-06-16 NOTE — Anesthesia Preprocedure Evaluation (Signed)
Anesthesia Evaluation  Patient identified by MRN, date of birth, ID band Patient awake    Reviewed: Allergy & Precautions, NPO status , Patient's Chart, lab work & pertinent test results  Airway Mallampati: II  TM Distance: >3 FB Neck ROM: Full    Dental  (+) Dental Advisory Given   Pulmonary neg pulmonary ROS, former smoker,    Pulmonary exam normal breath sounds clear to auscultation       Cardiovascular hypertension, Pt. on medications Normal cardiovascular exam Rhythm:Regular Rate:Normal     Neuro/Psych negative neurological ROS  negative psych ROS   GI/Hepatic negative GI ROS, (+) Hepatitis -  Endo/Other  negative endocrine ROS  Renal/GU Renal disease     Musculoskeletal negative musculoskeletal ROS (+)   Abdominal   Peds  Hematology negative hematology ROS (+)   Anesthesia Other Findings   Reproductive/Obstetrics negative OB ROS                             Anesthesia Physical Anesthesia Plan  ASA: III  Anesthesia Plan: MAC   Post-op Pain Management:    Induction: Intravenous  PONV Risk Score and Plan: 2 and Treatment may vary due to age or medical condition, Ondansetron and Propofol infusion  Airway Management Planned: Natural Airway and Simple Face Mask  Additional Equipment: None  Intra-op Plan:   Post-operative Plan:   Informed Consent: I have reviewed the patients History and Physical, chart, labs and discussed the procedure including the risks, benefits and alternatives for the proposed anesthesia with the patient or authorized representative who has indicated his/her understanding and acceptance.     Dental advisory given  Plan Discussed with: CRNA  Anesthesia Plan Comments:         Anesthesia Quick Evaluation

## 2018-06-17 ENCOUNTER — Encounter (HOSPITAL_COMMUNITY): Payer: Self-pay | Admitting: Vascular Surgery

## 2018-06-17 ENCOUNTER — Telehealth: Payer: Self-pay | Admitting: Vascular Surgery

## 2018-06-17 NOTE — Telephone Encounter (Signed)
-----   Message from Ulyses Amor, [place] sent at 06/16/2018  1:26 PM EDT -----  S/P left av fistula UE f/u with Dr. Carlis Abbott in 4-6 weeks with fistula duplex

## 2018-06-17 NOTE — Telephone Encounter (Signed)
sch appt spk to pt mld ltr 07/19/2018 11am Dialysis duplex 1140am p/o MD

## 2018-06-23 DIAGNOSIS — D631 Anemia in chronic kidney disease: Secondary | ICD-10-CM | POA: Diagnosis not present

## 2018-06-23 DIAGNOSIS — N2581 Secondary hyperparathyroidism of renal origin: Secondary | ICD-10-CM | POA: Diagnosis not present

## 2018-06-23 DIAGNOSIS — N189 Chronic kidney disease, unspecified: Secondary | ICD-10-CM | POA: Diagnosis not present

## 2018-06-23 DIAGNOSIS — I120 Hypertensive chronic kidney disease with stage 5 chronic kidney disease or end stage renal disease: Secondary | ICD-10-CM | POA: Diagnosis not present

## 2018-06-23 DIAGNOSIS — N185 Chronic kidney disease, stage 5: Secondary | ICD-10-CM | POA: Diagnosis not present

## 2018-06-23 LAB — CMP 10231
BUN/Creatinine Ratio: 9
BUN: 61
Calcium: 8.8
Creat: 7.13
EGFR (African American): 6
Phosphorus: 7.4
Potassium: 4.9
Sodium: 138

## 2018-06-23 LAB — PTH, INTACT: PTH, Intact: 525

## 2018-06-23 LAB — CBC WITH DIFFERENTIAL/PLATELET
Ferritin: 95
HCT: 27 — AB (ref 29–41)
Hemoglobin: 8.6
Iron Saturation: 16
Iron: 39
Total Iron Binding Capacity: 237
UIBC: 198

## 2018-06-24 ENCOUNTER — Other Ambulatory Visit: Payer: Self-pay

## 2018-06-24 ENCOUNTER — Ambulatory Visit (HOSPITAL_COMMUNITY)
Admission: RE | Admit: 2018-06-24 | Discharge: 2018-06-24 | Disposition: A | Discharge status: Home / Self Care | Payer: Medicare Other | Source: Ambulatory Visit | Attending: Nephrology | Admitting: Nephrology

## 2018-06-24 VITALS — BP 136/62 | HR 78 | Temp 98.6°F | Resp 18

## 2018-06-24 DIAGNOSIS — D631 Anemia in chronic kidney disease: Secondary | ICD-10-CM | POA: Insufficient documentation

## 2018-06-24 DIAGNOSIS — N185 Chronic kidney disease, stage 5: Secondary | ICD-10-CM | POA: Insufficient documentation

## 2018-06-24 LAB — IRON AND TIBC
Iron: 32 ug/dL (ref 28–170)
Saturation Ratios: 13 % (ref 10.4–31.8)
TIBC: 244 ug/dL — ABNORMAL LOW (ref 250–450)
UIBC: 212 ug/dL

## 2018-06-24 LAB — POCT HEMOGLOBIN-HEMACUE: Hemoglobin: 8 g/dL — ABNORMAL LOW (ref 12.0–15.0)

## 2018-06-24 LAB — FERRITIN: Ferritin: 60 ng/mL (ref 11–307)

## 2018-06-24 MED ORDER — EPOETIN ALFA-EPBX 10000 UNIT/ML IJ SOLN
40000.0000 [IU] | Freq: Once | INTRAMUSCULAR | Status: AC
  Administered 2018-06-24: 40000 [IU] via SUBCUTANEOUS
  Filled 2018-06-24: qty 4

## 2018-06-24 MED ORDER — EPOETIN ALFA-EPBX 40000 UNIT/ML IJ SOLN: 40000.0000 [IU] | INTRAMUSCULAR | Status: DC

## 2018-06-27 NOTE — Anesthesia Postprocedure Evaluation (Signed)
Anesthesia Post Note  Patient: Kimberly Frost  Procedure(s) Performed: Left Arm ARTERIOVENOUS (AV) FISTULA CREATION (Left Arm Lower)     Patient location during evaluation: PACU Anesthesia Type: MAC Level of consciousness: awake and alert Pain management: pain level controlled Vital Signs Assessment: post-procedure vital signs reviewed and stable Respiratory status: spontaneous breathing Cardiovascular status: stable Anesthetic complications: no    Last Vitals:  Vitals:   06/16/18 1434 06/16/18 1504  BP: 114/64 116/65  Pulse: 72 71  Resp: 19 17  Temp:  36.4 C  SpO2: 100% 100%    Last Pain:  Vitals:   06/16/18 1504  TempSrc:   PainSc: 0-No pain                 Nolon Nations

## 2018-07-07 ENCOUNTER — Other Ambulatory Visit (HOSPITAL_COMMUNITY): Payer: Self-pay | Admitting: *Deleted

## 2018-07-08 ENCOUNTER — Other Ambulatory Visit: Payer: Self-pay

## 2018-07-08 ENCOUNTER — Ambulatory Visit (HOSPITAL_COMMUNITY)
Admission: RE | Admit: 2018-07-08 | Discharge: 2018-07-08 | Disposition: A | Discharge status: Home / Self Care | Payer: Medicare Other | Source: Ambulatory Visit | Attending: Nephrology | Admitting: Nephrology

## 2018-07-08 VITALS — BP 138/68 | HR 66 | Temp 98.6°F | Resp 20

## 2018-07-08 DIAGNOSIS — N185 Chronic kidney disease, stage 5: Secondary | ICD-10-CM | POA: Diagnosis not present

## 2018-07-08 DIAGNOSIS — D631 Anemia in chronic kidney disease: Secondary | ICD-10-CM | POA: Insufficient documentation

## 2018-07-08 LAB — POCT HEMOGLOBIN-HEMACUE: Hemoglobin: 9.4 g/dL — ABNORMAL LOW (ref 12.0–15.0)

## 2018-07-08 MED ORDER — SODIUM CHLORIDE 0.9 % IV SOLN
510.0000 mg | INTRAVENOUS | Status: DC
  Administered 2018-07-08: 510 mg via INTRAVENOUS
  Filled 2018-07-08: qty 17
  Filled 2018-07-08: qty 510

## 2018-07-08 MED ORDER — EPOETIN ALFA-EPBX 40000 UNIT/ML IJ SOLN
40000.0000 [IU] | INTRAMUSCULAR | Status: DC
  Administered 2018-07-08: 40000 [IU] via SUBCUTANEOUS
  Filled 2018-07-08: qty 1

## 2018-07-08 NOTE — Discharge Instructions (Signed)

## 2018-07-15 ENCOUNTER — Other Ambulatory Visit: Payer: Self-pay

## 2018-07-15 DIAGNOSIS — N185 Chronic kidney disease, stage 5: Secondary | ICD-10-CM

## 2018-07-19 ENCOUNTER — Ambulatory Visit (HOSPITAL_COMMUNITY)
Admission: RE | Admit: 2018-07-19 | Discharge: 2018-07-19 | Disposition: A | Discharge status: Home / Self Care | Payer: Medicare Other | Source: Ambulatory Visit | Attending: Vascular Surgery | Admitting: Vascular Surgery

## 2018-07-19 ENCOUNTER — Encounter: Payer: Self-pay | Admitting: Vascular Surgery

## 2018-07-19 ENCOUNTER — Other Ambulatory Visit: Payer: Self-pay

## 2018-07-19 ENCOUNTER — Ambulatory Visit (INDEPENDENT_AMBULATORY_CARE_PROVIDER_SITE_OTHER): Payer: Self-pay | Admitting: Vascular Surgery

## 2018-07-19 VITALS — BP 121/71 | HR 68 | Temp 97.8°F | Resp 16 | Ht 66.0 in | Wt 113.0 lb

## 2018-07-19 DIAGNOSIS — N185 Chronic kidney disease, stage 5: Secondary | ICD-10-CM | POA: Insufficient documentation

## 2018-07-19 NOTE — Progress Notes (Signed)
Patient name: Kimberly Frost MRN: [id number] DOB: [birth year] Sex: female  REASON FOR VISIT: Post op check status post left brachiocephalic fistula  HPI: Kimberly Frost is a 68 y.o. female with history of stage V chronic kidney disease secondary to hypertension that presents for postop check after left brachiocephalic fistula placement on 06/16/2018.  Patient reports no issues with her left hand including numbness or tingling.  She has not started dialysis yet.  She can still feel a funny feeling in the fistula itself.  Past Medical History:  Diagnosis Date  . Chronic kidney disease   . Edema    RLE  . Hepatitis    Hep C  . History of blood transfusion   . Hypertension   . Wears dentures     Past Surgical History:  Procedure Laterality Date  . AV FISTULA PLACEMENT Left 06/16/2018   Procedure: Left Arm ARTERIOVENOUS (AV) FISTULA CREATION;  Surgeon: Marty Heck, MD;  Location: [facility];  Service: Vascular;  Laterality: Left;  Marland Kitchen MULTIPLE TOOTH EXTRACTIONS    . MYOMECTOMY      Family History  Problem Relation Age of Onset  . Diabetes Mother   . Hypertension Mother   . Bone cancer Father   . Kidney cancer Maternal Grandmother   . Stroke Brother   . Diabetes Brother     SOCIAL HISTORY: Social History   Tobacco Use  . Smoking status: Former Smoker    Types: Cigarettes  . Smokeless tobacco: Never Used  Substance Use Topics  . Alcohol use: Not Currently    Alcohol/week: 6.0 standard drinks    Types: 6 Cans of beer per week    No Known Allergies  Current Outpatient Medications  Medication Sig Dispense Refill  . acetaminophen (TYLENOL) 500 MG tablet Take 1,000 mg by mouth every 6 (six) hours as needed for moderate pain or headache.    Marland Kitchen amLODipine (NORVASC) 10 MG tablet Take 1 tablet (10 mg total) by mouth daily. 30 tablet 3  . furosemide (LASIX) 20 MG tablet Take 3 tablets (60 mg total) by mouth daily. 30 tablet 3  . labetalol (NORMODYNE) 300 MG tablet Take 1  tablet (300 mg total) by mouth 2 (two) times daily. 60 tablet 3  . oxyCODONE-acetaminophen (PERCOCET/ROXICET) 5-325 MG tablet Take 1 tablet by mouth every 6 (six) hours as needed. (Patient not taking: Reported on 07/19/2018) 6 tablet 0   No current facility-administered medications for this visit.     REVIEW OF SYSTEMS:  [X]  denotes positive finding, [ ]  denotes negative finding Cardiac  Comments:  Chest pain or chest pressure:    Shortness of breath upon exertion:    Short of breath when lying flat:    Irregular heart rhythm:        Vascular    Pain in calf, thigh, or hip brought on by ambulation:    Pain in feet at night that wakes you up from your sleep:     Blood clot in your veins:    Leg swelling:         Pulmonary    Oxygen at home:    Productive cough:     Wheezing:         Neurologic    Sudden weakness in arms or legs:     Sudden numbness in arms or legs:     Sudden onset of difficulty speaking or slurred speech:    Temporary loss of vision in one eye:  Problems with dizziness:         Gastrointestinal    Blood in stool:     Vomited blood:         Genitourinary    Burning when urinating:     Blood in urine:        Psychiatric    Major depression:         Hematologic    Bleeding problems:    Problems with blood clotting too easily:        Skin    Rashes or ulcers:        Constitutional    Fever or chills:      PHYSICAL EXAM: Vitals:   07/19/18 1142  BP: 121/71  Pulse: 68  Resp: 16  Temp: 97.8 F (36.6 C)  TempSrc: Temporal  SpO2: 100%  Weight: 113 lb (51.3 kg)  Height: 5\' 6"  (1.676 m)    GENERAL: The patient is a well-nourished female, in no acute distress. The vital signs are documented above. CARDIAC: There is a regular rate and rhythm.  VASCULAR:  2+ palpable left radial pulse Excellent thrill in left brachiocephalic fistula Incision well healed  DATA:   I independently reviewed her fistula duplex and she has a nice cephalic  vein fistula that is 6 to 7 mm in diameter with an appropriate depth with good flow volumes.  Assessment/Plan:  68 year old female now one month status post left brachiocephalic fistula.  There is an excellent thrill in the fistula and it appears to be maturing nicely.  Discussed that they can access her fistula after one more month if needed.  She can call with questions or concerns.   Marty Heck, MD [facility] Office: [phone] Pager: [place]

## 2018-07-22 ENCOUNTER — Encounter (HOSPITAL_COMMUNITY)
Admission: RE | Admit: 2018-07-22 | Discharge: 2018-07-22 | Disposition: A | Discharge status: Home / Self Care | Payer: Medicare Other | Source: Ambulatory Visit | Attending: Nephrology | Admitting: Nephrology

## 2018-07-22 ENCOUNTER — Other Ambulatory Visit: Payer: Self-pay

## 2018-07-22 VITALS — BP 151/64 | HR 69 | Temp 97.9°F | Resp 20 | Ht 66.0 in | Wt 114.0 lb

## 2018-07-22 DIAGNOSIS — D631 Anemia in chronic kidney disease: Secondary | ICD-10-CM | POA: Diagnosis not present

## 2018-07-22 DIAGNOSIS — N185 Chronic kidney disease, stage 5: Secondary | ICD-10-CM | POA: Insufficient documentation

## 2018-07-22 LAB — POCT HEMOGLOBIN-HEMACUE: Hemoglobin: 10 g/dL — ABNORMAL LOW (ref 12.0–15.0)

## 2018-07-22 MED ORDER — SODIUM CHLORIDE 0.9 % IV SOLN
510.0000 mg | INTRAVENOUS | Status: DC
  Administered 2018-07-22: 510 mg via INTRAVENOUS
  Filled 2018-07-22: qty 17

## 2018-07-22 MED ORDER — EPOETIN ALFA-EPBX 40000 UNIT/ML IJ SOLN: 40000.0000 [IU] | INTRAMUSCULAR | Status: DC

## 2018-07-22 MED ORDER — EPOETIN ALFA-EPBX 10000 UNIT/ML IJ SOLN
40000.0000 [IU] | Freq: Once | INTRAMUSCULAR | Status: AC
  Administered 2018-07-22: 40000 [IU] via SUBCUTANEOUS
  Filled 2018-07-22: qty 4

## 2018-07-27 DIAGNOSIS — M109 Gout, unspecified: Secondary | ICD-10-CM | POA: Diagnosis not present

## 2018-07-27 DIAGNOSIS — N189 Chronic kidney disease, unspecified: Secondary | ICD-10-CM | POA: Diagnosis not present

## 2018-07-27 DIAGNOSIS — D631 Anemia in chronic kidney disease: Secondary | ICD-10-CM | POA: Diagnosis not present

## 2018-07-27 DIAGNOSIS — I770 Arteriovenous fistula, acquired: Secondary | ICD-10-CM | POA: Diagnosis not present

## 2018-07-27 DIAGNOSIS — N2581 Secondary hyperparathyroidism of renal origin: Secondary | ICD-10-CM | POA: Diagnosis not present

## 2018-07-27 DIAGNOSIS — N185 Chronic kidney disease, stage 5: Secondary | ICD-10-CM | POA: Diagnosis not present

## 2018-07-27 DIAGNOSIS — I120 Hypertensive chronic kidney disease with stage 5 chronic kidney disease or end stage renal disease: Secondary | ICD-10-CM | POA: Diagnosis not present

## 2018-08-04 ENCOUNTER — Encounter (HOSPITAL_COMMUNITY)
Admission: RE | Admit: 2018-08-04 | Discharge: 2018-08-04 | Disposition: A | Discharge status: Home / Self Care | Payer: Medicare Other | Source: Ambulatory Visit | Attending: Nephrology | Admitting: Nephrology

## 2018-08-04 ENCOUNTER — Other Ambulatory Visit: Payer: Self-pay

## 2018-08-04 VITALS — BP 152/75 | HR 80 | Temp 97.6°F | Resp 18

## 2018-08-04 DIAGNOSIS — N185 Chronic kidney disease, stage 5: Secondary | ICD-10-CM | POA: Insufficient documentation

## 2018-08-04 DIAGNOSIS — D631 Anemia in chronic kidney disease: Secondary | ICD-10-CM | POA: Insufficient documentation

## 2018-08-04 LAB — IRON AND TIBC
Iron: 63 ug/dL (ref 28–170)
Saturation Ratios: 28 % (ref 10.4–31.8)
TIBC: 228 ug/dL — ABNORMAL LOW (ref 250–450)
UIBC: 165 ug/dL

## 2018-08-04 LAB — FERRITIN: Ferritin: 279 ng/mL (ref 11–307)

## 2018-08-04 MED ORDER — EPOETIN ALFA-EPBX 10000 UNIT/ML IJ SOLN
40000.0000 [IU] | INTRAMUSCULAR | Status: DC
  Filled 2018-08-04: qty 4

## 2018-08-08 LAB — POCT HEMOGLOBIN-HEMACUE: Hemoglobin: 12.3 g/dL (ref 12.0–15.0)

## 2018-08-18 ENCOUNTER — Other Ambulatory Visit: Payer: Self-pay

## 2018-08-18 ENCOUNTER — Ambulatory Visit (HOSPITAL_COMMUNITY)
Admission: RE | Admit: 2018-08-18 | Discharge: 2018-08-18 | Disposition: A | Discharge status: Home / Self Care | Payer: Medicare Other | Source: Ambulatory Visit | Attending: Nephrology | Admitting: Nephrology

## 2018-08-18 VITALS — BP 97/52 | HR 80 | Temp 98.6°F | Resp 18

## 2018-08-18 DIAGNOSIS — D631 Anemia in chronic kidney disease: Secondary | ICD-10-CM | POA: Insufficient documentation

## 2018-08-18 DIAGNOSIS — N185 Chronic kidney disease, stage 5: Secondary | ICD-10-CM | POA: Insufficient documentation

## 2018-08-18 LAB — POCT HEMOGLOBIN-HEMACUE: Hemoglobin: 12 g/dL (ref 12.0–15.0)

## 2018-08-18 MED ORDER — EPOETIN ALFA-EPBX 10000 UNIT/ML IJ SOLN
40000.0000 [IU] | INTRAMUSCULAR | Status: DC
  Filled 2018-08-18: qty 4

## 2018-08-26 DIAGNOSIS — D631 Anemia in chronic kidney disease: Secondary | ICD-10-CM | POA: Diagnosis not present

## 2018-08-26 DIAGNOSIS — M109 Gout, unspecified: Secondary | ICD-10-CM | POA: Diagnosis not present

## 2018-08-26 DIAGNOSIS — N189 Chronic kidney disease, unspecified: Secondary | ICD-10-CM | POA: Diagnosis not present

## 2018-08-26 DIAGNOSIS — N185 Chronic kidney disease, stage 5: Secondary | ICD-10-CM | POA: Diagnosis not present

## 2018-08-26 DIAGNOSIS — N2581 Secondary hyperparathyroidism of renal origin: Secondary | ICD-10-CM | POA: Diagnosis not present

## 2018-08-26 DIAGNOSIS — I120 Hypertensive chronic kidney disease with stage 5 chronic kidney disease or end stage renal disease: Secondary | ICD-10-CM | POA: Diagnosis not present

## 2018-09-01 ENCOUNTER — Inpatient Hospital Stay (HOSPITAL_COMMUNITY)
Admission: RE | Admit: 2018-09-01 | Discharge: 2018-09-01 | Disposition: A | Discharge status: Home / Self Care | Payer: Medicare Other | Source: Ambulatory Visit | Attending: Nephrology | Admitting: Nephrology

## 2018-09-06 DIAGNOSIS — D508 Other iron deficiency anemias: Secondary | ICD-10-CM | POA: Insufficient documentation

## 2018-09-06 DIAGNOSIS — N189 Chronic kidney disease, unspecified: Secondary | ICD-10-CM | POA: Insufficient documentation

## 2018-09-06 DIAGNOSIS — N2581 Secondary hyperparathyroidism of renal origin: Secondary | ICD-10-CM | POA: Insufficient documentation

## 2018-09-06 DIAGNOSIS — D689 Coagulation defect, unspecified: Secondary | ICD-10-CM | POA: Insufficient documentation

## 2018-09-06 DIAGNOSIS — E872 Acidosis, unspecified: Secondary | ICD-10-CM | POA: Insufficient documentation

## 2018-09-06 DIAGNOSIS — N186 End stage renal disease: Secondary | ICD-10-CM | POA: Insufficient documentation

## 2018-09-06 DIAGNOSIS — D631 Anemia in chronic kidney disease: Secondary | ICD-10-CM | POA: Insufficient documentation

## 2018-09-06 DIAGNOSIS — Z992 Dependence on renal dialysis: Secondary | ICD-10-CM | POA: Diagnosis not present

## 2018-09-06 DIAGNOSIS — M109 Gout, unspecified: Secondary | ICD-10-CM | POA: Insufficient documentation

## 2018-09-08 ENCOUNTER — Encounter (HOSPITAL_COMMUNITY): Payer: Medicare Other

## 2018-09-08 DIAGNOSIS — N186 End stage renal disease: Secondary | ICD-10-CM | POA: Diagnosis not present

## 2018-09-08 DIAGNOSIS — D689 Coagulation defect, unspecified: Secondary | ICD-10-CM | POA: Diagnosis not present

## 2018-09-08 DIAGNOSIS — Z992 Dependence on renal dialysis: Secondary | ICD-10-CM | POA: Diagnosis not present

## 2018-09-08 DIAGNOSIS — N2581 Secondary hyperparathyroidism of renal origin: Secondary | ICD-10-CM | POA: Diagnosis not present

## 2018-09-10 DIAGNOSIS — Z992 Dependence on renal dialysis: Secondary | ICD-10-CM | POA: Diagnosis not present

## 2018-09-10 DIAGNOSIS — N186 End stage renal disease: Secondary | ICD-10-CM | POA: Diagnosis not present

## 2018-09-10 DIAGNOSIS — N2581 Secondary hyperparathyroidism of renal origin: Secondary | ICD-10-CM | POA: Diagnosis not present

## 2018-09-10 DIAGNOSIS — D689 Coagulation defect, unspecified: Secondary | ICD-10-CM | POA: Diagnosis not present

## 2018-09-13 DIAGNOSIS — N186 End stage renal disease: Secondary | ICD-10-CM | POA: Diagnosis not present

## 2018-09-13 DIAGNOSIS — Z992 Dependence on renal dialysis: Secondary | ICD-10-CM | POA: Diagnosis not present

## 2018-09-13 DIAGNOSIS — D689 Coagulation defect, unspecified: Secondary | ICD-10-CM | POA: Diagnosis not present

## 2018-09-13 DIAGNOSIS — N2581 Secondary hyperparathyroidism of renal origin: Secondary | ICD-10-CM | POA: Diagnosis not present

## 2018-09-15 DIAGNOSIS — N186 End stage renal disease: Secondary | ICD-10-CM | POA: Diagnosis not present

## 2018-09-15 DIAGNOSIS — D689 Coagulation defect, unspecified: Secondary | ICD-10-CM | POA: Diagnosis not present

## 2018-09-15 DIAGNOSIS — N2581 Secondary hyperparathyroidism of renal origin: Secondary | ICD-10-CM | POA: Diagnosis not present

## 2018-09-15 DIAGNOSIS — Z992 Dependence on renal dialysis: Secondary | ICD-10-CM | POA: Diagnosis not present

## 2018-09-17 DIAGNOSIS — N2581 Secondary hyperparathyroidism of renal origin: Secondary | ICD-10-CM | POA: Diagnosis not present

## 2018-09-17 DIAGNOSIS — N186 End stage renal disease: Secondary | ICD-10-CM | POA: Diagnosis not present

## 2018-09-17 DIAGNOSIS — D689 Coagulation defect, unspecified: Secondary | ICD-10-CM | POA: Diagnosis not present

## 2018-09-17 DIAGNOSIS — Z992 Dependence on renal dialysis: Secondary | ICD-10-CM | POA: Diagnosis not present

## 2018-09-20 DIAGNOSIS — N186 End stage renal disease: Secondary | ICD-10-CM | POA: Diagnosis not present

## 2018-09-20 DIAGNOSIS — D689 Coagulation defect, unspecified: Secondary | ICD-10-CM | POA: Diagnosis not present

## 2018-09-20 DIAGNOSIS — N2581 Secondary hyperparathyroidism of renal origin: Secondary | ICD-10-CM | POA: Diagnosis not present

## 2018-09-20 DIAGNOSIS — Z992 Dependence on renal dialysis: Secondary | ICD-10-CM | POA: Diagnosis not present

## 2018-09-22 DIAGNOSIS — N2581 Secondary hyperparathyroidism of renal origin: Secondary | ICD-10-CM | POA: Diagnosis not present

## 2018-09-22 DIAGNOSIS — N186 End stage renal disease: Secondary | ICD-10-CM | POA: Diagnosis not present

## 2018-09-22 DIAGNOSIS — D689 Coagulation defect, unspecified: Secondary | ICD-10-CM | POA: Diagnosis not present

## 2018-09-22 DIAGNOSIS — Z992 Dependence on renal dialysis: Secondary | ICD-10-CM | POA: Diagnosis not present

## 2018-09-24 DIAGNOSIS — N186 End stage renal disease: Secondary | ICD-10-CM | POA: Diagnosis not present

## 2018-09-24 DIAGNOSIS — Z992 Dependence on renal dialysis: Secondary | ICD-10-CM | POA: Diagnosis not present

## 2018-09-24 DIAGNOSIS — N2581 Secondary hyperparathyroidism of renal origin: Secondary | ICD-10-CM | POA: Diagnosis not present

## 2018-09-24 DIAGNOSIS — D689 Coagulation defect, unspecified: Secondary | ICD-10-CM | POA: Diagnosis not present

## 2018-09-27 DIAGNOSIS — D689 Coagulation defect, unspecified: Secondary | ICD-10-CM | POA: Diagnosis not present

## 2018-09-27 DIAGNOSIS — N2581 Secondary hyperparathyroidism of renal origin: Secondary | ICD-10-CM | POA: Diagnosis not present

## 2018-09-27 DIAGNOSIS — N186 End stage renal disease: Secondary | ICD-10-CM | POA: Diagnosis not present

## 2018-09-27 DIAGNOSIS — Z992 Dependence on renal dialysis: Secondary | ICD-10-CM | POA: Diagnosis not present

## 2018-09-29 ENCOUNTER — Encounter (HOSPITAL_COMMUNITY): Payer: Medicare Other

## 2018-09-29 DIAGNOSIS — N186 End stage renal disease: Secondary | ICD-10-CM | POA: Diagnosis not present

## 2018-09-29 DIAGNOSIS — N2581 Secondary hyperparathyroidism of renal origin: Secondary | ICD-10-CM | POA: Diagnosis not present

## 2018-09-29 DIAGNOSIS — D689 Coagulation defect, unspecified: Secondary | ICD-10-CM | POA: Diagnosis not present

## 2018-09-29 DIAGNOSIS — Z992 Dependence on renal dialysis: Secondary | ICD-10-CM | POA: Diagnosis not present

## 2018-10-01 DIAGNOSIS — D689 Coagulation defect, unspecified: Secondary | ICD-10-CM | POA: Diagnosis not present

## 2018-10-01 DIAGNOSIS — N2581 Secondary hyperparathyroidism of renal origin: Secondary | ICD-10-CM | POA: Diagnosis not present

## 2018-10-01 DIAGNOSIS — N186 End stage renal disease: Secondary | ICD-10-CM | POA: Diagnosis not present

## 2018-10-01 DIAGNOSIS — Z992 Dependence on renal dialysis: Secondary | ICD-10-CM | POA: Diagnosis not present

## 2018-10-03 DIAGNOSIS — I129 Hypertensive chronic kidney disease with stage 1 through stage 4 chronic kidney disease, or unspecified chronic kidney disease: Secondary | ICD-10-CM | POA: Diagnosis not present

## 2018-10-03 DIAGNOSIS — Z992 Dependence on renal dialysis: Secondary | ICD-10-CM | POA: Diagnosis not present

## 2018-10-03 DIAGNOSIS — N186 End stage renal disease: Secondary | ICD-10-CM | POA: Diagnosis not present

## 2018-10-04 DIAGNOSIS — D631 Anemia in chronic kidney disease: Secondary | ICD-10-CM | POA: Diagnosis not present

## 2018-10-04 DIAGNOSIS — R51 Headache: Secondary | ICD-10-CM | POA: Diagnosis not present

## 2018-10-04 DIAGNOSIS — Z992 Dependence on renal dialysis: Secondary | ICD-10-CM | POA: Diagnosis not present

## 2018-10-04 DIAGNOSIS — R52 Pain, unspecified: Secondary | ICD-10-CM | POA: Diagnosis not present

## 2018-10-04 DIAGNOSIS — N2581 Secondary hyperparathyroidism of renal origin: Secondary | ICD-10-CM | POA: Diagnosis not present

## 2018-10-04 DIAGNOSIS — D508 Other iron deficiency anemias: Secondary | ICD-10-CM | POA: Diagnosis not present

## 2018-10-04 DIAGNOSIS — D689 Coagulation defect, unspecified: Secondary | ICD-10-CM | POA: Diagnosis not present

## 2018-10-04 DIAGNOSIS — R509 Fever, unspecified: Secondary | ICD-10-CM | POA: Diagnosis not present

## 2018-10-04 DIAGNOSIS — Z23 Encounter for immunization: Secondary | ICD-10-CM | POA: Diagnosis not present

## 2018-10-04 DIAGNOSIS — N186 End stage renal disease: Secondary | ICD-10-CM | POA: Diagnosis not present

## 2018-10-06 DIAGNOSIS — R52 Pain, unspecified: Secondary | ICD-10-CM | POA: Diagnosis not present

## 2018-10-06 DIAGNOSIS — D689 Coagulation defect, unspecified: Secondary | ICD-10-CM | POA: Diagnosis not present

## 2018-10-06 DIAGNOSIS — N2581 Secondary hyperparathyroidism of renal origin: Secondary | ICD-10-CM | POA: Diagnosis not present

## 2018-10-06 DIAGNOSIS — Z992 Dependence on renal dialysis: Secondary | ICD-10-CM | POA: Diagnosis not present

## 2018-10-06 DIAGNOSIS — D631 Anemia in chronic kidney disease: Secondary | ICD-10-CM | POA: Diagnosis not present

## 2018-10-06 DIAGNOSIS — Z23 Encounter for immunization: Secondary | ICD-10-CM | POA: Diagnosis not present

## 2018-10-06 DIAGNOSIS — D508 Other iron deficiency anemias: Secondary | ICD-10-CM | POA: Diagnosis not present

## 2018-10-06 DIAGNOSIS — R509 Fever, unspecified: Secondary | ICD-10-CM | POA: Diagnosis not present

## 2018-10-06 DIAGNOSIS — R51 Headache: Secondary | ICD-10-CM | POA: Diagnosis not present

## 2018-10-06 DIAGNOSIS — N186 End stage renal disease: Secondary | ICD-10-CM | POA: Diagnosis not present

## 2018-10-08 DIAGNOSIS — R52 Pain, unspecified: Secondary | ICD-10-CM | POA: Diagnosis not present

## 2018-10-08 DIAGNOSIS — Z992 Dependence on renal dialysis: Secondary | ICD-10-CM | POA: Diagnosis not present

## 2018-10-08 DIAGNOSIS — N2581 Secondary hyperparathyroidism of renal origin: Secondary | ICD-10-CM | POA: Diagnosis not present

## 2018-10-08 DIAGNOSIS — D689 Coagulation defect, unspecified: Secondary | ICD-10-CM | POA: Diagnosis not present

## 2018-10-08 DIAGNOSIS — Z23 Encounter for immunization: Secondary | ICD-10-CM | POA: Diagnosis not present

## 2018-10-08 DIAGNOSIS — D631 Anemia in chronic kidney disease: Secondary | ICD-10-CM | POA: Diagnosis not present

## 2018-10-08 DIAGNOSIS — D508 Other iron deficiency anemias: Secondary | ICD-10-CM | POA: Diagnosis not present

## 2018-10-08 DIAGNOSIS — R51 Headache: Secondary | ICD-10-CM | POA: Diagnosis not present

## 2018-10-08 DIAGNOSIS — N186 End stage renal disease: Secondary | ICD-10-CM | POA: Diagnosis not present

## 2018-10-08 DIAGNOSIS — R509 Fever, unspecified: Secondary | ICD-10-CM | POA: Diagnosis not present

## 2018-10-13 DIAGNOSIS — D689 Coagulation defect, unspecified: Secondary | ICD-10-CM | POA: Diagnosis not present

## 2018-10-13 DIAGNOSIS — R509 Fever, unspecified: Secondary | ICD-10-CM | POA: Diagnosis not present

## 2018-10-13 DIAGNOSIS — D631 Anemia in chronic kidney disease: Secondary | ICD-10-CM | POA: Diagnosis not present

## 2018-10-13 DIAGNOSIS — N186 End stage renal disease: Secondary | ICD-10-CM | POA: Diagnosis not present

## 2018-10-13 DIAGNOSIS — R51 Headache: Secondary | ICD-10-CM | POA: Diagnosis not present

## 2018-10-13 DIAGNOSIS — D508 Other iron deficiency anemias: Secondary | ICD-10-CM | POA: Diagnosis not present

## 2018-10-13 DIAGNOSIS — Z23 Encounter for immunization: Secondary | ICD-10-CM | POA: Diagnosis not present

## 2018-10-13 DIAGNOSIS — Z992 Dependence on renal dialysis: Secondary | ICD-10-CM | POA: Diagnosis not present

## 2018-10-13 DIAGNOSIS — N2581 Secondary hyperparathyroidism of renal origin: Secondary | ICD-10-CM | POA: Diagnosis not present

## 2018-10-13 DIAGNOSIS — R52 Pain, unspecified: Secondary | ICD-10-CM | POA: Diagnosis not present

## 2018-10-15 DIAGNOSIS — N2581 Secondary hyperparathyroidism of renal origin: Secondary | ICD-10-CM | POA: Diagnosis not present

## 2018-10-15 DIAGNOSIS — D689 Coagulation defect, unspecified: Secondary | ICD-10-CM | POA: Diagnosis not present

## 2018-10-15 DIAGNOSIS — R509 Fever, unspecified: Secondary | ICD-10-CM | POA: Diagnosis not present

## 2018-10-15 DIAGNOSIS — D508 Other iron deficiency anemias: Secondary | ICD-10-CM | POA: Diagnosis not present

## 2018-10-15 DIAGNOSIS — R52 Pain, unspecified: Secondary | ICD-10-CM | POA: Diagnosis not present

## 2018-10-15 DIAGNOSIS — D631 Anemia in chronic kidney disease: Secondary | ICD-10-CM | POA: Diagnosis not present

## 2018-10-15 DIAGNOSIS — R51 Headache: Secondary | ICD-10-CM | POA: Diagnosis not present

## 2018-10-15 DIAGNOSIS — Z23 Encounter for immunization: Secondary | ICD-10-CM | POA: Diagnosis not present

## 2018-10-15 DIAGNOSIS — N186 End stage renal disease: Secondary | ICD-10-CM | POA: Diagnosis not present

## 2018-10-15 DIAGNOSIS — Z992 Dependence on renal dialysis: Secondary | ICD-10-CM | POA: Diagnosis not present

## 2018-10-18 DIAGNOSIS — Z992 Dependence on renal dialysis: Secondary | ICD-10-CM | POA: Diagnosis not present

## 2018-10-18 DIAGNOSIS — D689 Coagulation defect, unspecified: Secondary | ICD-10-CM | POA: Diagnosis not present

## 2018-10-18 DIAGNOSIS — R509 Fever, unspecified: Secondary | ICD-10-CM | POA: Diagnosis not present

## 2018-10-18 DIAGNOSIS — R52 Pain, unspecified: Secondary | ICD-10-CM | POA: Diagnosis not present

## 2018-10-18 DIAGNOSIS — R51 Headache: Secondary | ICD-10-CM | POA: Diagnosis not present

## 2018-10-18 DIAGNOSIS — D631 Anemia in chronic kidney disease: Secondary | ICD-10-CM | POA: Diagnosis not present

## 2018-10-18 DIAGNOSIS — N186 End stage renal disease: Secondary | ICD-10-CM | POA: Diagnosis not present

## 2018-10-18 DIAGNOSIS — Z23 Encounter for immunization: Secondary | ICD-10-CM | POA: Diagnosis not present

## 2018-10-18 DIAGNOSIS — N2581 Secondary hyperparathyroidism of renal origin: Secondary | ICD-10-CM | POA: Diagnosis not present

## 2018-10-18 DIAGNOSIS — D508 Other iron deficiency anemias: Secondary | ICD-10-CM | POA: Diagnosis not present

## 2018-10-20 DIAGNOSIS — N186 End stage renal disease: Secondary | ICD-10-CM | POA: Diagnosis not present

## 2018-10-20 DIAGNOSIS — R52 Pain, unspecified: Secondary | ICD-10-CM | POA: Diagnosis not present

## 2018-10-20 DIAGNOSIS — D631 Anemia in chronic kidney disease: Secondary | ICD-10-CM | POA: Diagnosis not present

## 2018-10-20 DIAGNOSIS — N2581 Secondary hyperparathyroidism of renal origin: Secondary | ICD-10-CM | POA: Diagnosis not present

## 2018-10-20 DIAGNOSIS — D689 Coagulation defect, unspecified: Secondary | ICD-10-CM | POA: Diagnosis not present

## 2018-10-20 DIAGNOSIS — D508 Other iron deficiency anemias: Secondary | ICD-10-CM | POA: Diagnosis not present

## 2018-10-20 DIAGNOSIS — Z23 Encounter for immunization: Secondary | ICD-10-CM | POA: Diagnosis not present

## 2018-10-20 DIAGNOSIS — R51 Headache: Secondary | ICD-10-CM | POA: Diagnosis not present

## 2018-10-20 DIAGNOSIS — R509 Fever, unspecified: Secondary | ICD-10-CM | POA: Diagnosis not present

## 2018-10-20 DIAGNOSIS — Z992 Dependence on renal dialysis: Secondary | ICD-10-CM | POA: Diagnosis not present

## 2018-10-22 DIAGNOSIS — D631 Anemia in chronic kidney disease: Secondary | ICD-10-CM | POA: Diagnosis not present

## 2018-10-22 DIAGNOSIS — D508 Other iron deficiency anemias: Secondary | ICD-10-CM | POA: Diagnosis not present

## 2018-10-22 DIAGNOSIS — R52 Pain, unspecified: Secondary | ICD-10-CM | POA: Diagnosis not present

## 2018-10-22 DIAGNOSIS — N2581 Secondary hyperparathyroidism of renal origin: Secondary | ICD-10-CM | POA: Diagnosis not present

## 2018-10-22 DIAGNOSIS — R509 Fever, unspecified: Secondary | ICD-10-CM | POA: Diagnosis not present

## 2018-10-22 DIAGNOSIS — N186 End stage renal disease: Secondary | ICD-10-CM | POA: Diagnosis not present

## 2018-10-22 DIAGNOSIS — R51 Headache: Secondary | ICD-10-CM | POA: Diagnosis not present

## 2018-10-22 DIAGNOSIS — Z992 Dependence on renal dialysis: Secondary | ICD-10-CM | POA: Diagnosis not present

## 2018-10-22 DIAGNOSIS — D689 Coagulation defect, unspecified: Secondary | ICD-10-CM | POA: Diagnosis not present

## 2018-10-22 DIAGNOSIS — Z23 Encounter for immunization: Secondary | ICD-10-CM | POA: Diagnosis not present

## 2018-10-25 DIAGNOSIS — Z23 Encounter for immunization: Secondary | ICD-10-CM | POA: Diagnosis not present

## 2018-10-25 DIAGNOSIS — R51 Headache: Secondary | ICD-10-CM | POA: Diagnosis not present

## 2018-10-25 DIAGNOSIS — N186 End stage renal disease: Secondary | ICD-10-CM | POA: Diagnosis not present

## 2018-10-25 DIAGNOSIS — D689 Coagulation defect, unspecified: Secondary | ICD-10-CM | POA: Diagnosis not present

## 2018-10-25 DIAGNOSIS — R52 Pain, unspecified: Secondary | ICD-10-CM | POA: Diagnosis not present

## 2018-10-25 DIAGNOSIS — D631 Anemia in chronic kidney disease: Secondary | ICD-10-CM | POA: Diagnosis not present

## 2018-10-25 DIAGNOSIS — D508 Other iron deficiency anemias: Secondary | ICD-10-CM | POA: Diagnosis not present

## 2018-10-25 DIAGNOSIS — N2581 Secondary hyperparathyroidism of renal origin: Secondary | ICD-10-CM | POA: Diagnosis not present

## 2018-10-25 DIAGNOSIS — R509 Fever, unspecified: Secondary | ICD-10-CM | POA: Diagnosis not present

## 2018-10-25 DIAGNOSIS — Z992 Dependence on renal dialysis: Secondary | ICD-10-CM | POA: Diagnosis not present

## 2018-10-26 ENCOUNTER — Other Ambulatory Visit: Payer: Self-pay | Admitting: Internal Medicine

## 2018-10-26 DIAGNOSIS — E213 Hyperparathyroidism, unspecified: Secondary | ICD-10-CM | POA: Insufficient documentation

## 2018-10-26 DIAGNOSIS — Z862 Personal history of diseases of the blood and blood-forming organs and certain disorders involving the immune mechanism: Secondary | ICD-10-CM | POA: Insufficient documentation

## 2018-10-26 DIAGNOSIS — N186 End stage renal disease: Secondary | ICD-10-CM | POA: Insufficient documentation

## 2018-10-27 DIAGNOSIS — R509 Fever, unspecified: Secondary | ICD-10-CM | POA: Diagnosis not present

## 2018-10-27 DIAGNOSIS — Z23 Encounter for immunization: Secondary | ICD-10-CM | POA: Diagnosis not present

## 2018-10-27 DIAGNOSIS — Z992 Dependence on renal dialysis: Secondary | ICD-10-CM | POA: Diagnosis not present

## 2018-10-27 DIAGNOSIS — N186 End stage renal disease: Secondary | ICD-10-CM | POA: Diagnosis not present

## 2018-10-27 DIAGNOSIS — R51 Headache: Secondary | ICD-10-CM | POA: Diagnosis not present

## 2018-10-27 DIAGNOSIS — N2581 Secondary hyperparathyroidism of renal origin: Secondary | ICD-10-CM | POA: Diagnosis not present

## 2018-10-27 DIAGNOSIS — D508 Other iron deficiency anemias: Secondary | ICD-10-CM | POA: Diagnosis not present

## 2018-10-27 DIAGNOSIS — D689 Coagulation defect, unspecified: Secondary | ICD-10-CM | POA: Diagnosis not present

## 2018-10-27 DIAGNOSIS — R52 Pain, unspecified: Secondary | ICD-10-CM | POA: Diagnosis not present

## 2018-10-27 DIAGNOSIS — D631 Anemia in chronic kidney disease: Secondary | ICD-10-CM | POA: Diagnosis not present

## 2018-10-29 DIAGNOSIS — Z992 Dependence on renal dialysis: Secondary | ICD-10-CM | POA: Diagnosis not present

## 2018-10-29 DIAGNOSIS — D631 Anemia in chronic kidney disease: Secondary | ICD-10-CM | POA: Diagnosis not present

## 2018-10-29 DIAGNOSIS — Z23 Encounter for immunization: Secondary | ICD-10-CM | POA: Diagnosis not present

## 2018-10-29 DIAGNOSIS — R51 Headache: Secondary | ICD-10-CM | POA: Diagnosis not present

## 2018-10-29 DIAGNOSIS — N2581 Secondary hyperparathyroidism of renal origin: Secondary | ICD-10-CM | POA: Diagnosis not present

## 2018-10-29 DIAGNOSIS — N186 End stage renal disease: Secondary | ICD-10-CM | POA: Diagnosis not present

## 2018-10-29 DIAGNOSIS — D689 Coagulation defect, unspecified: Secondary | ICD-10-CM | POA: Diagnosis not present

## 2018-10-29 DIAGNOSIS — R509 Fever, unspecified: Secondary | ICD-10-CM | POA: Diagnosis not present

## 2018-10-29 DIAGNOSIS — D508 Other iron deficiency anemias: Secondary | ICD-10-CM | POA: Diagnosis not present

## 2018-10-29 DIAGNOSIS — R52 Pain, unspecified: Secondary | ICD-10-CM | POA: Diagnosis not present

## 2018-10-31 DIAGNOSIS — R519 Headache, unspecified: Secondary | ICD-10-CM | POA: Insufficient documentation

## 2018-10-31 DIAGNOSIS — R52 Pain, unspecified: Secondary | ICD-10-CM | POA: Insufficient documentation

## 2018-10-31 DIAGNOSIS — R509 Fever, unspecified: Secondary | ICD-10-CM | POA: Insufficient documentation

## 2018-11-01 DIAGNOSIS — R52 Pain, unspecified: Secondary | ICD-10-CM | POA: Diagnosis not present

## 2018-11-01 DIAGNOSIS — R51 Headache: Secondary | ICD-10-CM | POA: Diagnosis not present

## 2018-11-01 DIAGNOSIS — N186 End stage renal disease: Secondary | ICD-10-CM | POA: Diagnosis not present

## 2018-11-01 DIAGNOSIS — N2581 Secondary hyperparathyroidism of renal origin: Secondary | ICD-10-CM | POA: Diagnosis not present

## 2018-11-01 DIAGNOSIS — R509 Fever, unspecified: Secondary | ICD-10-CM | POA: Diagnosis not present

## 2018-11-01 DIAGNOSIS — D508 Other iron deficiency anemias: Secondary | ICD-10-CM | POA: Diagnosis not present

## 2018-11-01 DIAGNOSIS — Z23 Encounter for immunization: Secondary | ICD-10-CM | POA: Diagnosis not present

## 2018-11-01 DIAGNOSIS — D631 Anemia in chronic kidney disease: Secondary | ICD-10-CM | POA: Diagnosis not present

## 2018-11-01 DIAGNOSIS — Z992 Dependence on renal dialysis: Secondary | ICD-10-CM | POA: Diagnosis not present

## 2018-11-01 DIAGNOSIS — D689 Coagulation defect, unspecified: Secondary | ICD-10-CM | POA: Diagnosis not present

## 2018-11-02 DIAGNOSIS — N186 End stage renal disease: Secondary | ICD-10-CM | POA: Diagnosis not present

## 2018-11-02 DIAGNOSIS — Z992 Dependence on renal dialysis: Secondary | ICD-10-CM | POA: Diagnosis not present

## 2018-11-02 DIAGNOSIS — I129 Hypertensive chronic kidney disease with stage 1 through stage 4 chronic kidney disease, or unspecified chronic kidney disease: Secondary | ICD-10-CM | POA: Diagnosis not present

## 2018-11-03 DIAGNOSIS — D508 Other iron deficiency anemias: Secondary | ICD-10-CM | POA: Diagnosis not present

## 2018-11-03 DIAGNOSIS — N186 End stage renal disease: Secondary | ICD-10-CM | POA: Diagnosis not present

## 2018-11-03 DIAGNOSIS — Z992 Dependence on renal dialysis: Secondary | ICD-10-CM | POA: Diagnosis not present

## 2018-11-03 DIAGNOSIS — Z23 Encounter for immunization: Secondary | ICD-10-CM | POA: Diagnosis not present

## 2018-11-03 DIAGNOSIS — N2581 Secondary hyperparathyroidism of renal origin: Secondary | ICD-10-CM | POA: Diagnosis not present

## 2018-11-03 DIAGNOSIS — D631 Anemia in chronic kidney disease: Secondary | ICD-10-CM | POA: Diagnosis not present

## 2018-11-03 DIAGNOSIS — D689 Coagulation defect, unspecified: Secondary | ICD-10-CM | POA: Diagnosis not present

## 2018-11-05 DIAGNOSIS — D631 Anemia in chronic kidney disease: Secondary | ICD-10-CM | POA: Diagnosis not present

## 2018-11-05 DIAGNOSIS — N186 End stage renal disease: Secondary | ICD-10-CM | POA: Diagnosis not present

## 2018-11-05 DIAGNOSIS — D689 Coagulation defect, unspecified: Secondary | ICD-10-CM | POA: Diagnosis not present

## 2018-11-05 DIAGNOSIS — Z23 Encounter for immunization: Secondary | ICD-10-CM | POA: Diagnosis not present

## 2018-11-05 DIAGNOSIS — N2581 Secondary hyperparathyroidism of renal origin: Secondary | ICD-10-CM | POA: Diagnosis not present

## 2018-11-05 DIAGNOSIS — D508 Other iron deficiency anemias: Secondary | ICD-10-CM | POA: Diagnosis not present

## 2018-11-05 DIAGNOSIS — Z992 Dependence on renal dialysis: Secondary | ICD-10-CM | POA: Diagnosis not present

## 2018-11-08 DIAGNOSIS — D508 Other iron deficiency anemias: Secondary | ICD-10-CM | POA: Diagnosis not present

## 2018-11-08 DIAGNOSIS — Z23 Encounter for immunization: Secondary | ICD-10-CM | POA: Diagnosis not present

## 2018-11-08 DIAGNOSIS — N2581 Secondary hyperparathyroidism of renal origin: Secondary | ICD-10-CM | POA: Diagnosis not present

## 2018-11-08 DIAGNOSIS — Z992 Dependence on renal dialysis: Secondary | ICD-10-CM | POA: Diagnosis not present

## 2018-11-08 DIAGNOSIS — D631 Anemia in chronic kidney disease: Secondary | ICD-10-CM | POA: Diagnosis not present

## 2018-11-08 DIAGNOSIS — N186 End stage renal disease: Secondary | ICD-10-CM | POA: Diagnosis not present

## 2018-11-08 DIAGNOSIS — D689 Coagulation defect, unspecified: Secondary | ICD-10-CM | POA: Diagnosis not present

## 2018-11-10 DIAGNOSIS — D689 Coagulation defect, unspecified: Secondary | ICD-10-CM | POA: Diagnosis not present

## 2018-11-10 DIAGNOSIS — D508 Other iron deficiency anemias: Secondary | ICD-10-CM | POA: Diagnosis not present

## 2018-11-10 DIAGNOSIS — D631 Anemia in chronic kidney disease: Secondary | ICD-10-CM | POA: Diagnosis not present

## 2018-11-10 DIAGNOSIS — N186 End stage renal disease: Secondary | ICD-10-CM | POA: Diagnosis not present

## 2018-11-10 DIAGNOSIS — Z992 Dependence on renal dialysis: Secondary | ICD-10-CM | POA: Diagnosis not present

## 2018-11-10 DIAGNOSIS — N2581 Secondary hyperparathyroidism of renal origin: Secondary | ICD-10-CM | POA: Diagnosis not present

## 2018-11-10 DIAGNOSIS — Z23 Encounter for immunization: Secondary | ICD-10-CM | POA: Diagnosis not present

## 2018-11-12 DIAGNOSIS — N186 End stage renal disease: Secondary | ICD-10-CM | POA: Diagnosis not present

## 2018-11-12 DIAGNOSIS — D508 Other iron deficiency anemias: Secondary | ICD-10-CM | POA: Diagnosis not present

## 2018-11-12 DIAGNOSIS — D631 Anemia in chronic kidney disease: Secondary | ICD-10-CM | POA: Diagnosis not present

## 2018-11-12 DIAGNOSIS — D689 Coagulation defect, unspecified: Secondary | ICD-10-CM | POA: Diagnosis not present

## 2018-11-12 DIAGNOSIS — Z992 Dependence on renal dialysis: Secondary | ICD-10-CM | POA: Diagnosis not present

## 2018-11-12 DIAGNOSIS — N2581 Secondary hyperparathyroidism of renal origin: Secondary | ICD-10-CM | POA: Diagnosis not present

## 2018-11-12 DIAGNOSIS — Z23 Encounter for immunization: Secondary | ICD-10-CM | POA: Diagnosis not present

## 2018-11-15 DIAGNOSIS — N186 End stage renal disease: Secondary | ICD-10-CM | POA: Diagnosis not present

## 2018-11-15 DIAGNOSIS — Z992 Dependence on renal dialysis: Secondary | ICD-10-CM | POA: Diagnosis not present

## 2018-11-15 DIAGNOSIS — N2581 Secondary hyperparathyroidism of renal origin: Secondary | ICD-10-CM | POA: Diagnosis not present

## 2018-11-15 DIAGNOSIS — D631 Anemia in chronic kidney disease: Secondary | ICD-10-CM | POA: Diagnosis not present

## 2018-11-15 DIAGNOSIS — D689 Coagulation defect, unspecified: Secondary | ICD-10-CM | POA: Diagnosis not present

## 2018-11-15 DIAGNOSIS — Z23 Encounter for immunization: Secondary | ICD-10-CM | POA: Diagnosis not present

## 2018-11-15 DIAGNOSIS — D508 Other iron deficiency anemias: Secondary | ICD-10-CM | POA: Diagnosis not present

## 2018-11-17 DIAGNOSIS — Z23 Encounter for immunization: Secondary | ICD-10-CM | POA: Diagnosis not present

## 2018-11-17 DIAGNOSIS — D689 Coagulation defect, unspecified: Secondary | ICD-10-CM | POA: Diagnosis not present

## 2018-11-17 DIAGNOSIS — Z992 Dependence on renal dialysis: Secondary | ICD-10-CM | POA: Diagnosis not present

## 2018-11-17 DIAGNOSIS — D631 Anemia in chronic kidney disease: Secondary | ICD-10-CM | POA: Diagnosis not present

## 2018-11-17 DIAGNOSIS — N186 End stage renal disease: Secondary | ICD-10-CM | POA: Diagnosis not present

## 2018-11-17 DIAGNOSIS — D508 Other iron deficiency anemias: Secondary | ICD-10-CM | POA: Diagnosis not present

## 2018-11-17 DIAGNOSIS — N2581 Secondary hyperparathyroidism of renal origin: Secondary | ICD-10-CM | POA: Diagnosis not present

## 2018-11-19 DIAGNOSIS — Z992 Dependence on renal dialysis: Secondary | ICD-10-CM | POA: Diagnosis not present

## 2018-11-19 DIAGNOSIS — N186 End stage renal disease: Secondary | ICD-10-CM | POA: Diagnosis not present

## 2018-11-19 DIAGNOSIS — D631 Anemia in chronic kidney disease: Secondary | ICD-10-CM | POA: Diagnosis not present

## 2018-11-19 DIAGNOSIS — N2581 Secondary hyperparathyroidism of renal origin: Secondary | ICD-10-CM | POA: Diagnosis not present

## 2018-11-19 DIAGNOSIS — D508 Other iron deficiency anemias: Secondary | ICD-10-CM | POA: Diagnosis not present

## 2018-11-19 DIAGNOSIS — Z23 Encounter for immunization: Secondary | ICD-10-CM | POA: Diagnosis not present

## 2018-11-19 DIAGNOSIS — D689 Coagulation defect, unspecified: Secondary | ICD-10-CM | POA: Diagnosis not present

## 2018-11-22 DIAGNOSIS — Z23 Encounter for immunization: Secondary | ICD-10-CM | POA: Diagnosis not present

## 2018-11-22 DIAGNOSIS — N186 End stage renal disease: Secondary | ICD-10-CM | POA: Diagnosis not present

## 2018-11-22 DIAGNOSIS — D631 Anemia in chronic kidney disease: Secondary | ICD-10-CM | POA: Diagnosis not present

## 2018-11-22 DIAGNOSIS — D508 Other iron deficiency anemias: Secondary | ICD-10-CM | POA: Diagnosis not present

## 2018-11-22 DIAGNOSIS — D689 Coagulation defect, unspecified: Secondary | ICD-10-CM | POA: Diagnosis not present

## 2018-11-22 DIAGNOSIS — N2581 Secondary hyperparathyroidism of renal origin: Secondary | ICD-10-CM | POA: Diagnosis not present

## 2018-11-22 DIAGNOSIS — Z992 Dependence on renal dialysis: Secondary | ICD-10-CM | POA: Diagnosis not present

## 2018-11-24 DIAGNOSIS — N186 End stage renal disease: Secondary | ICD-10-CM | POA: Diagnosis not present

## 2018-11-24 DIAGNOSIS — D631 Anemia in chronic kidney disease: Secondary | ICD-10-CM | POA: Diagnosis not present

## 2018-11-24 DIAGNOSIS — D689 Coagulation defect, unspecified: Secondary | ICD-10-CM | POA: Diagnosis not present

## 2018-11-24 DIAGNOSIS — Z23 Encounter for immunization: Secondary | ICD-10-CM | POA: Diagnosis not present

## 2018-11-24 DIAGNOSIS — Z992 Dependence on renal dialysis: Secondary | ICD-10-CM | POA: Diagnosis not present

## 2018-11-24 DIAGNOSIS — D508 Other iron deficiency anemias: Secondary | ICD-10-CM | POA: Diagnosis not present

## 2018-11-24 DIAGNOSIS — N2581 Secondary hyperparathyroidism of renal origin: Secondary | ICD-10-CM | POA: Diagnosis not present

## 2018-11-26 DIAGNOSIS — N2581 Secondary hyperparathyroidism of renal origin: Secondary | ICD-10-CM | POA: Diagnosis not present

## 2018-11-26 DIAGNOSIS — N186 End stage renal disease: Secondary | ICD-10-CM | POA: Diagnosis not present

## 2018-11-26 DIAGNOSIS — Z992 Dependence on renal dialysis: Secondary | ICD-10-CM | POA: Diagnosis not present

## 2018-11-26 DIAGNOSIS — D631 Anemia in chronic kidney disease: Secondary | ICD-10-CM | POA: Diagnosis not present

## 2018-11-26 DIAGNOSIS — Z23 Encounter for immunization: Secondary | ICD-10-CM | POA: Diagnosis not present

## 2018-11-26 DIAGNOSIS — D508 Other iron deficiency anemias: Secondary | ICD-10-CM | POA: Diagnosis not present

## 2018-11-26 DIAGNOSIS — D689 Coagulation defect, unspecified: Secondary | ICD-10-CM | POA: Diagnosis not present

## 2018-11-29 DIAGNOSIS — N2581 Secondary hyperparathyroidism of renal origin: Secondary | ICD-10-CM | POA: Diagnosis not present

## 2018-11-29 DIAGNOSIS — D508 Other iron deficiency anemias: Secondary | ICD-10-CM | POA: Diagnosis not present

## 2018-11-29 DIAGNOSIS — D631 Anemia in chronic kidney disease: Secondary | ICD-10-CM | POA: Diagnosis not present

## 2018-11-29 DIAGNOSIS — D689 Coagulation defect, unspecified: Secondary | ICD-10-CM | POA: Diagnosis not present

## 2018-11-29 DIAGNOSIS — N186 End stage renal disease: Secondary | ICD-10-CM | POA: Diagnosis not present

## 2018-11-29 DIAGNOSIS — Z23 Encounter for immunization: Secondary | ICD-10-CM | POA: Diagnosis not present

## 2018-11-29 DIAGNOSIS — Z992 Dependence on renal dialysis: Secondary | ICD-10-CM | POA: Diagnosis not present

## 2018-12-01 DIAGNOSIS — D631 Anemia in chronic kidney disease: Secondary | ICD-10-CM | POA: Diagnosis not present

## 2018-12-01 DIAGNOSIS — N2581 Secondary hyperparathyroidism of renal origin: Secondary | ICD-10-CM | POA: Diagnosis not present

## 2018-12-01 DIAGNOSIS — D508 Other iron deficiency anemias: Secondary | ICD-10-CM | POA: Diagnosis not present

## 2018-12-01 DIAGNOSIS — N186 End stage renal disease: Secondary | ICD-10-CM | POA: Diagnosis not present

## 2018-12-01 DIAGNOSIS — Z23 Encounter for immunization: Secondary | ICD-10-CM | POA: Diagnosis not present

## 2018-12-01 DIAGNOSIS — Z992 Dependence on renal dialysis: Secondary | ICD-10-CM | POA: Diagnosis not present

## 2018-12-01 DIAGNOSIS — D689 Coagulation defect, unspecified: Secondary | ICD-10-CM | POA: Diagnosis not present

## 2018-12-03 DIAGNOSIS — Z992 Dependence on renal dialysis: Secondary | ICD-10-CM | POA: Diagnosis not present

## 2018-12-03 DIAGNOSIS — D689 Coagulation defect, unspecified: Secondary | ICD-10-CM | POA: Diagnosis not present

## 2018-12-03 DIAGNOSIS — Z23 Encounter for immunization: Secondary | ICD-10-CM | POA: Diagnosis not present

## 2018-12-03 DIAGNOSIS — I129 Hypertensive chronic kidney disease with stage 1 through stage 4 chronic kidney disease, or unspecified chronic kidney disease: Secondary | ICD-10-CM | POA: Diagnosis not present

## 2018-12-03 DIAGNOSIS — N2581 Secondary hyperparathyroidism of renal origin: Secondary | ICD-10-CM | POA: Diagnosis not present

## 2018-12-03 DIAGNOSIS — D631 Anemia in chronic kidney disease: Secondary | ICD-10-CM | POA: Diagnosis not present

## 2018-12-03 DIAGNOSIS — N186 End stage renal disease: Secondary | ICD-10-CM | POA: Diagnosis not present

## 2018-12-03 DIAGNOSIS — D508 Other iron deficiency anemias: Secondary | ICD-10-CM | POA: Diagnosis not present

## 2018-12-06 DIAGNOSIS — Z992 Dependence on renal dialysis: Secondary | ICD-10-CM | POA: Diagnosis not present

## 2018-12-06 DIAGNOSIS — D689 Coagulation defect, unspecified: Secondary | ICD-10-CM | POA: Diagnosis not present

## 2018-12-06 DIAGNOSIS — N186 End stage renal disease: Secondary | ICD-10-CM | POA: Diagnosis not present

## 2018-12-06 DIAGNOSIS — N2581 Secondary hyperparathyroidism of renal origin: Secondary | ICD-10-CM | POA: Diagnosis not present

## 2018-12-06 DIAGNOSIS — D508 Other iron deficiency anemias: Secondary | ICD-10-CM | POA: Diagnosis not present

## 2018-12-06 DIAGNOSIS — Z23 Encounter for immunization: Secondary | ICD-10-CM | POA: Diagnosis not present

## 2018-12-08 DIAGNOSIS — D508 Other iron deficiency anemias: Secondary | ICD-10-CM | POA: Diagnosis not present

## 2018-12-08 DIAGNOSIS — N2581 Secondary hyperparathyroidism of renal origin: Secondary | ICD-10-CM | POA: Diagnosis not present

## 2018-12-08 DIAGNOSIS — D689 Coagulation defect, unspecified: Secondary | ICD-10-CM | POA: Diagnosis not present

## 2018-12-08 DIAGNOSIS — N186 End stage renal disease: Secondary | ICD-10-CM | POA: Diagnosis not present

## 2018-12-08 DIAGNOSIS — Z23 Encounter for immunization: Secondary | ICD-10-CM | POA: Diagnosis not present

## 2018-12-08 DIAGNOSIS — Z992 Dependence on renal dialysis: Secondary | ICD-10-CM | POA: Diagnosis not present

## 2018-12-10 DIAGNOSIS — Z23 Encounter for immunization: Secondary | ICD-10-CM | POA: Diagnosis not present

## 2018-12-10 DIAGNOSIS — D689 Coagulation defect, unspecified: Secondary | ICD-10-CM | POA: Diagnosis not present

## 2018-12-10 DIAGNOSIS — D508 Other iron deficiency anemias: Secondary | ICD-10-CM | POA: Diagnosis not present

## 2018-12-10 DIAGNOSIS — N2581 Secondary hyperparathyroidism of renal origin: Secondary | ICD-10-CM | POA: Diagnosis not present

## 2018-12-10 DIAGNOSIS — Z992 Dependence on renal dialysis: Secondary | ICD-10-CM | POA: Diagnosis not present

## 2018-12-10 DIAGNOSIS — N186 End stage renal disease: Secondary | ICD-10-CM | POA: Diagnosis not present

## 2018-12-13 DIAGNOSIS — D689 Coagulation defect, unspecified: Secondary | ICD-10-CM | POA: Diagnosis not present

## 2018-12-13 DIAGNOSIS — Z992 Dependence on renal dialysis: Secondary | ICD-10-CM | POA: Diagnosis not present

## 2018-12-13 DIAGNOSIS — Z23 Encounter for immunization: Secondary | ICD-10-CM | POA: Diagnosis not present

## 2018-12-13 DIAGNOSIS — N186 End stage renal disease: Secondary | ICD-10-CM | POA: Diagnosis not present

## 2018-12-13 DIAGNOSIS — N2581 Secondary hyperparathyroidism of renal origin: Secondary | ICD-10-CM | POA: Diagnosis not present

## 2018-12-13 DIAGNOSIS — D508 Other iron deficiency anemias: Secondary | ICD-10-CM | POA: Diagnosis not present

## 2018-12-15 DIAGNOSIS — Z23 Encounter for immunization: Secondary | ICD-10-CM | POA: Diagnosis not present

## 2018-12-15 DIAGNOSIS — N2581 Secondary hyperparathyroidism of renal origin: Secondary | ICD-10-CM | POA: Diagnosis not present

## 2018-12-15 DIAGNOSIS — N186 End stage renal disease: Secondary | ICD-10-CM | POA: Diagnosis not present

## 2018-12-15 DIAGNOSIS — D689 Coagulation defect, unspecified: Secondary | ICD-10-CM | POA: Diagnosis not present

## 2018-12-15 DIAGNOSIS — Z992 Dependence on renal dialysis: Secondary | ICD-10-CM | POA: Diagnosis not present

## 2018-12-15 DIAGNOSIS — D508 Other iron deficiency anemias: Secondary | ICD-10-CM | POA: Diagnosis not present

## 2018-12-17 DIAGNOSIS — D508 Other iron deficiency anemias: Secondary | ICD-10-CM | POA: Diagnosis not present

## 2018-12-17 DIAGNOSIS — Z992 Dependence on renal dialysis: Secondary | ICD-10-CM | POA: Diagnosis not present

## 2018-12-17 DIAGNOSIS — N186 End stage renal disease: Secondary | ICD-10-CM | POA: Diagnosis not present

## 2018-12-17 DIAGNOSIS — Z23 Encounter for immunization: Secondary | ICD-10-CM | POA: Diagnosis not present

## 2018-12-17 DIAGNOSIS — N2581 Secondary hyperparathyroidism of renal origin: Secondary | ICD-10-CM | POA: Diagnosis not present

## 2018-12-17 DIAGNOSIS — D689 Coagulation defect, unspecified: Secondary | ICD-10-CM | POA: Diagnosis not present

## 2018-12-20 DIAGNOSIS — D508 Other iron deficiency anemias: Secondary | ICD-10-CM | POA: Diagnosis not present

## 2018-12-20 DIAGNOSIS — N186 End stage renal disease: Secondary | ICD-10-CM | POA: Diagnosis not present

## 2018-12-20 DIAGNOSIS — N2581 Secondary hyperparathyroidism of renal origin: Secondary | ICD-10-CM | POA: Diagnosis not present

## 2018-12-20 DIAGNOSIS — Z23 Encounter for immunization: Secondary | ICD-10-CM | POA: Diagnosis not present

## 2018-12-20 DIAGNOSIS — D689 Coagulation defect, unspecified: Secondary | ICD-10-CM | POA: Diagnosis not present

## 2018-12-20 DIAGNOSIS — Z992 Dependence on renal dialysis: Secondary | ICD-10-CM | POA: Diagnosis not present

## 2018-12-22 DIAGNOSIS — N2581 Secondary hyperparathyroidism of renal origin: Secondary | ICD-10-CM | POA: Diagnosis not present

## 2018-12-22 DIAGNOSIS — Z992 Dependence on renal dialysis: Secondary | ICD-10-CM | POA: Diagnosis not present

## 2018-12-22 DIAGNOSIS — Z23 Encounter for immunization: Secondary | ICD-10-CM | POA: Diagnosis not present

## 2018-12-22 DIAGNOSIS — D508 Other iron deficiency anemias: Secondary | ICD-10-CM | POA: Diagnosis not present

## 2018-12-22 DIAGNOSIS — D689 Coagulation defect, unspecified: Secondary | ICD-10-CM | POA: Diagnosis not present

## 2018-12-22 DIAGNOSIS — N186 End stage renal disease: Secondary | ICD-10-CM | POA: Diagnosis not present

## 2018-12-24 DIAGNOSIS — Z992 Dependence on renal dialysis: Secondary | ICD-10-CM | POA: Diagnosis not present

## 2018-12-24 DIAGNOSIS — D689 Coagulation defect, unspecified: Secondary | ICD-10-CM | POA: Diagnosis not present

## 2018-12-24 DIAGNOSIS — N186 End stage renal disease: Secondary | ICD-10-CM | POA: Diagnosis not present

## 2018-12-24 DIAGNOSIS — Z23 Encounter for immunization: Secondary | ICD-10-CM | POA: Diagnosis not present

## 2018-12-24 DIAGNOSIS — D508 Other iron deficiency anemias: Secondary | ICD-10-CM | POA: Diagnosis not present

## 2018-12-24 DIAGNOSIS — N2581 Secondary hyperparathyroidism of renal origin: Secondary | ICD-10-CM | POA: Diagnosis not present

## 2018-12-26 DIAGNOSIS — D508 Other iron deficiency anemias: Secondary | ICD-10-CM | POA: Diagnosis not present

## 2018-12-26 DIAGNOSIS — N2581 Secondary hyperparathyroidism of renal origin: Secondary | ICD-10-CM | POA: Diagnosis not present

## 2018-12-26 DIAGNOSIS — D689 Coagulation defect, unspecified: Secondary | ICD-10-CM | POA: Diagnosis not present

## 2018-12-26 DIAGNOSIS — N186 End stage renal disease: Secondary | ICD-10-CM | POA: Diagnosis not present

## 2018-12-26 DIAGNOSIS — Z992 Dependence on renal dialysis: Secondary | ICD-10-CM | POA: Diagnosis not present

## 2018-12-26 DIAGNOSIS — Z23 Encounter for immunization: Secondary | ICD-10-CM | POA: Diagnosis not present

## 2018-12-28 DIAGNOSIS — Z23 Encounter for immunization: Secondary | ICD-10-CM | POA: Diagnosis not present

## 2018-12-28 DIAGNOSIS — D689 Coagulation defect, unspecified: Secondary | ICD-10-CM | POA: Diagnosis not present

## 2018-12-28 DIAGNOSIS — Z992 Dependence on renal dialysis: Secondary | ICD-10-CM | POA: Diagnosis not present

## 2018-12-28 DIAGNOSIS — N2581 Secondary hyperparathyroidism of renal origin: Secondary | ICD-10-CM | POA: Diagnosis not present

## 2018-12-28 DIAGNOSIS — D508 Other iron deficiency anemias: Secondary | ICD-10-CM | POA: Diagnosis not present

## 2018-12-28 DIAGNOSIS — N186 End stage renal disease: Secondary | ICD-10-CM | POA: Diagnosis not present

## 2018-12-31 DIAGNOSIS — D689 Coagulation defect, unspecified: Secondary | ICD-10-CM | POA: Diagnosis not present

## 2018-12-31 DIAGNOSIS — N186 End stage renal disease: Secondary | ICD-10-CM | POA: Diagnosis not present

## 2018-12-31 DIAGNOSIS — D508 Other iron deficiency anemias: Secondary | ICD-10-CM | POA: Diagnosis not present

## 2018-12-31 DIAGNOSIS — Z992 Dependence on renal dialysis: Secondary | ICD-10-CM | POA: Diagnosis not present

## 2018-12-31 DIAGNOSIS — Z23 Encounter for immunization: Secondary | ICD-10-CM | POA: Diagnosis not present

## 2018-12-31 DIAGNOSIS — N2581 Secondary hyperparathyroidism of renal origin: Secondary | ICD-10-CM | POA: Diagnosis not present

## 2019-01-02 DIAGNOSIS — Z992 Dependence on renal dialysis: Secondary | ICD-10-CM | POA: Diagnosis not present

## 2019-01-02 DIAGNOSIS — I129 Hypertensive chronic kidney disease with stage 1 through stage 4 chronic kidney disease, or unspecified chronic kidney disease: Secondary | ICD-10-CM | POA: Diagnosis not present

## 2019-01-02 DIAGNOSIS — N186 End stage renal disease: Secondary | ICD-10-CM | POA: Diagnosis not present

## 2019-01-03 DIAGNOSIS — N186 End stage renal disease: Secondary | ICD-10-CM | POA: Diagnosis not present

## 2019-01-03 DIAGNOSIS — Z992 Dependence on renal dialysis: Secondary | ICD-10-CM | POA: Diagnosis not present

## 2019-01-03 DIAGNOSIS — N2581 Secondary hyperparathyroidism of renal origin: Secondary | ICD-10-CM | POA: Diagnosis not present

## 2019-01-03 DIAGNOSIS — D689 Coagulation defect, unspecified: Secondary | ICD-10-CM | POA: Diagnosis not present

## 2019-01-04 ENCOUNTER — Ambulatory Visit: Payer: Medicare Other | Admitting: Internal Medicine

## 2019-01-05 DIAGNOSIS — N186 End stage renal disease: Secondary | ICD-10-CM | POA: Diagnosis not present

## 2019-01-05 DIAGNOSIS — N2581 Secondary hyperparathyroidism of renal origin: Secondary | ICD-10-CM | POA: Diagnosis not present

## 2019-01-05 DIAGNOSIS — D689 Coagulation defect, unspecified: Secondary | ICD-10-CM | POA: Diagnosis not present

## 2019-01-05 DIAGNOSIS — Z992 Dependence on renal dialysis: Secondary | ICD-10-CM | POA: Diagnosis not present

## 2019-01-06 ENCOUNTER — Ambulatory Visit (INDEPENDENT_AMBULATORY_CARE_PROVIDER_SITE_OTHER): Payer: Medicare Other | Admitting: Internal Medicine

## 2019-01-06 ENCOUNTER — Ambulatory Visit (INDEPENDENT_AMBULATORY_CARE_PROVIDER_SITE_OTHER)
Admission: RE | Admit: 2019-01-06 | Discharge: 2019-01-06 | Disposition: A | Discharge status: Home / Self Care | Payer: Medicare Other | Source: Ambulatory Visit | Attending: Internal Medicine | Admitting: Internal Medicine

## 2019-01-06 ENCOUNTER — Encounter: Payer: Self-pay | Admitting: Internal Medicine

## 2019-01-06 ENCOUNTER — Other Ambulatory Visit: Payer: Self-pay

## 2019-01-06 VITALS — BP 116/70 | HR 94 | Temp 97.3°F | Wt 118.0 lb

## 2019-01-06 DIAGNOSIS — N898 Other specified noninflammatory disorders of vagina: Secondary | ICD-10-CM

## 2019-01-06 DIAGNOSIS — M7989 Other specified soft tissue disorders: Secondary | ICD-10-CM

## 2019-01-06 DIAGNOSIS — M79642 Pain in left hand: Secondary | ICD-10-CM

## 2019-01-06 DIAGNOSIS — M059 Rheumatoid arthritis with rheumatoid factor, unspecified: Secondary | ICD-10-CM | POA: Diagnosis not present

## 2019-01-06 DIAGNOSIS — M19042 Primary osteoarthritis, left hand: Secondary | ICD-10-CM | POA: Diagnosis not present

## 2019-01-06 DIAGNOSIS — M79641 Pain in right hand: Secondary | ICD-10-CM

## 2019-01-06 DIAGNOSIS — R760 Raised antibody titer: Secondary | ICD-10-CM | POA: Diagnosis not present

## 2019-01-06 DIAGNOSIS — M069 Rheumatoid arthritis, unspecified: Secondary | ICD-10-CM | POA: Diagnosis not present

## 2019-01-06 NOTE — Patient Instructions (Signed)
Arthritis Arthritis means joint pain. It can also mean joint disease. A joint is a place where bones come together. There are more than 100 types of arthritis. What are the causes? This condition may be caused by:  Wear and tear of a joint. This is the most common cause.  A lot of acid in the blood, which leads to pain in the joint (gout).  Pain and swelling (inflammation) in a joint.  Infection of a joint.  Injuries in the joint.  A reaction to medicines (allergy). In some cases, the cause may not be known. What are the signs or symptoms? Symptoms of this condition include:  Redness at a joint.  Swelling at a joint.  Stiffness at a joint.  Warmth coming from the joint.  A fever.  A feeling of being sick. How is this treated? This condition may be treated with:  Treating the cause, if it is known.  Rest.  Raising (elevating) the joint.  Putting cold or hot packs on the joint.  Medicines to treat symptoms and reduce pain and swelling.  Shots of medicines (cortisone) into the joint. You may also be told to make changes in your life, such as doing exercises and losing weight. Follow these instructions at home: Medicines  Take over-the-counter and prescription medicines only as told by your doctor.  Do not take aspirin for pain if your doctor says that you may have gout. Activity  Rest your joint if your doctor tells you to.  Avoid activities that make the pain worse.  Exercise your joint regularly as told by your doctor. Try doing exercises like: ? Swimming. ? Water aerobics. ? Biking. ? Walking. Managing pain, stiffness, and swelling      If told, put ice on the affected area. ? Put ice in a plastic bag. ? Place a towel between your skin and the bag. ? Leave the ice on for 20 minutes, 2-3 times per day.  If your joint is swollen, raise (elevate) it above the level of your heart if told by your doctor.  If your joint feels stiff in the morning,  try taking a warm shower.  If told, put heat on the affected area. Do this as often as told by your doctor. Use the heat source that your doctor recommends, such as a moist heat pack or a heating pad. If you have diabetes, do not apply heat without asking your doctor. To apply heat: ? Place a towel between your skin and the heat source. ? Leave the heat on for 20-30 minutes. ? Remove the heat if your skin turns bright red. This is very important if you are unable to feel pain, heat, or cold. You may have a greater risk of getting burned. General instructions  Do not use any products that contain nicotine or tobacco, such as cigarettes, e-cigarettes, and chewing tobacco. If you need help quitting, ask your doctor.  Keep all follow-up visits as told by your doctor. This is important. Contact a doctor if:  The pain gets worse.  You have a fever. Get help right away if:  You have very bad pain in your joint.  You have swelling in your joint.  Your joint is red.  Many joints become painful and swollen.  You have very bad back pain.  Your leg is very weak.  You cannot control your pee (urine) or poop (stool). Summary  Arthritis means joint pain. It can also mean joint disease. A joint is a place  where bones come together.  The most common cause of this condition is wear and tear of a joint.  Symptoms of this condition include redness, swelling, or stiffness of the joint.  This condition is treated with rest, raising the joint, medicines, and putting cold or hot packs on the joint.  Follow your doctor's instructions about medicines, activity, exercises, and other home care treatments. This information is not intended to replace advice given to you by your health care provider. Make sure you discuss any questions you have with your health care provider. Document Released: 04/15/2009 Document Revised: 12/27/2017 Document Reviewed: 12/27/2017 Elsevier Patient Education  2020  [employer].

## 2019-01-06 NOTE — Addendum Note (Signed)
Addended by: Lurlean Nanny on: 01/06/2019 02:43 PM   Modules accepted: Orders

## 2019-01-06 NOTE — Progress Notes (Signed)
Subjective:    Patient ID: Kimberly Frost, female    DOB: Sep 17, 1950, 68 y.o.   MRN: [id number]  HPI  Pt presents to the clinic today with c/o pain and swelling of hands. She noticed this 7 months ago. She describes the pain as achy. She denies associated numbness or tingling. She is having difficulty gripping, squeezing and lifting. She has tried Tylenol without any relief. She has no family history of RA. She has no history of gout.  She also thinks she may have a yeast infection. She reports vaginal discharge and odor. This started 2-3 days ago. The discharge is thin and white. She denies abnormal vaginal bleeding. She denies urinary urgency, frequency, dysuria or blood in her urine. She is not sexually active. She tried douching but has not tried anything OTC for this.   Review of Systems      Past Medical History:  Diagnosis Date  . Chronic kidney disease   . Edema    RLE  . Hepatitis    Hep C  . History of blood transfusion   . Hypertension   . Wears dentures     Current Outpatient Medications  Medication Sig Dispense Refill  . acetaminophen (TYLENOL) 500 MG tablet Take 1,000 mg by mouth every 6 (six) hours as needed for moderate pain or headache.    Marland Kitchen amLODipine (NORVASC) 10 MG tablet Take 1 tablet (10 mg total) by mouth daily. 30 tablet 3  . furosemide (LASIX) 20 MG tablet Take 3 tablets (60 mg total) by mouth daily. 30 tablet 3  . labetalol (NORMODYNE) 300 MG tablet Take 1 tablet (300 mg total) by mouth 2 (two) times daily. 60 tablet 3  . oxyCODONE-acetaminophen (PERCOCET/ROXICET) 5-325 MG tablet Take 1 tablet by mouth every 6 (six) hours as needed. (Patient not taking: Reported on 07/19/2018) 6 tablet 0   No current facility-administered medications for this visit.     No Known Allergies  Family History  Problem Relation Age of Onset  . Diabetes Mother   . Hypertension Mother   . Bone cancer Father   . Kidney cancer Maternal Grandmother   . Stroke Brother   .  Diabetes Brother     Social History   Socioeconomic History  . Marital status: Single    Spouse name: Not on file  . Number of children: Not on file  . Years of education: Not on file  . Highest education level: Not on file  Occupational History  . Not on file  Social Needs  . Financial resource strain: Not on file  . Food insecurity    Worry: Not on file    Inability: Not on file  . Transportation needs    Medical: Not on file    Non-medical: Not on file  Tobacco Use  . Smoking status: Former Smoker    Types: Cigarettes  . Smokeless tobacco: Never Used  Substance and Sexual Activity  . Alcohol use: Not Currently    Alcohol/week: 6.0 standard drinks    Types: 6 Cans of beer per week  . Drug use: Yes    Types: Marijuana    Comment: occasional   . Sexual activity: Not Currently  Lifestyle  . Physical activity    Days per week: Not on file    Minutes per session: Not on file  . Stress: Not on file  Relationships  . Social Herbalist on phone: Not on file    Gets together: Not  on file    Attends religious service: Not on file    Active member of club or organization: Not on file    Attends meetings of clubs or organizations: Not on file    Relationship status: Not on file  . Intimate partner violence    Fear of current or ex partner: Not on file    Emotionally abused: Not on file    Physically abused: Not on file    Forced sexual activity: Not on file  Other Topics Concern  . Not on file  Social History Narrative  . Not on file     Constitutional: Denies fever, malaise, fatigue, headache or abrupt weight changes.  Respiratory: Denies difficulty breathing, shortness of breath, cough or sputum production.   Cardiovascular: Denies chest pain, chest tightness, palpitations or swelling in the hands or feet.  Gastrointestinal: Denies abdominal pain, bloating, constipation, diarrhea or blood in the stool.  GU: Pt reports vaginal discharge and odor. Denies  urgency, frequency, pain with urination, burning sensation, blood in urine. Musculoskeletal: Pt reports pain and swelling of hands. Denies decrease in range of motion, difficulty with gait, muscle pain.  Skin: Denies redness, rashes, lesions or ulcercations.  Neurological: Denies numbness, tingling or problems with coordination.    No other specific complaints in a complete review of systems (except as listed in HPI above).  Objective:   Physical Exam  BP 116/70   Pulse 94   Temp (!) 97.3 F (36.3 C) (Temporal)   Wt 118 lb (53.5 kg)   SpO2 98%   BMI 19.05 kg/m   Wt Readings from Last 3 Encounters:  07/22/18 114 lb (51.7 kg)  07/19/18 113 lb (51.3 kg)  06/16/18 107 lb (48.5 kg)    General: Appears her stated age, well developed, well nourished in NAD. Skin: Warm, dry and intact. No rashes noted. Cardiovascular: Normal rate and rhythm. Cap refill < 3 secs. Radial pulses 2+ bilaterally. Pulmonary/Chest: Normal effort and positive vesicular breath sounds. No respiratory distress. No wheezes, rales or ronchi noted.  Abdomen: Soft and nontender. Normal bowel sounds. No distention or masses noted. No CVA tenderness noted. Musculoskeletal: Normal flexion and extension of the fingers. No joint swelling noted but has Herbden's and bouchard's nodes bilaterally. Some disfigurement of bilateral hands. Hand grips equal. Neurological: Alert and oriented. Sensation intact to BUE.   BMET    Component Value Date/Time   NA 138 06/23/2018   K 4.9 06/23/2018   CL 108 03/09/2018 1650   CO2 19 03/09/2018 1650   GLUCOSE 95 06/16/2018 1103   BUN 61 06/23/2018   CREATININE 7.13 06/23/2018   CALCIUM 8.8 06/23/2018   GFRNONAA 9 (L) 02/27/2018 0533   GFRAA 6 06/23/2018    Lipid Panel     Component Value Date/Time   CHOL 154 02/25/2018 1233   TRIG 65.0 02/25/2018 1233   HDL 67.40 02/25/2018 1233   CHOLHDL 2 02/25/2018 1233   VLDL 13.0 02/25/2018 1233   LDLCALC 74 02/25/2018 1233    CBC     Component Value Date/Time   WBC 5.4 03/09/2018 1650   RBC 3.52 (L) 03/09/2018 1650   HGB 12.0 08/18/2018 1344   HCT 27 (A) 06/23/2018   PLT 221.0 03/09/2018 1650   MCV 88.4 03/09/2018 1650   MCH 28.4 02/27/2018 0533   MCHC 33.2 03/09/2018 1650   RDW 14.6 03/09/2018 1650   LYMPHSABS 2.3 02/26/2018 0930   MONOABS 0.3 02/26/2018 0930   EOSABS 0.2 02/26/2018 0930  BASOSABS 0.0 02/26/2018 0930    Hgb A1C No results found for: HGBA1C         Assessment & Plan:   Pain and Swelling of Bilateral Hands:  OA vs RA Will obtain uric acid, ANA, ESR, CRP, RF She is unable to take NSAID's OTC Encouraged Tylenol Arthritis 650 mg ER BID  Vaginal Discharge and Odor:  Likely BV Obtained self swab wet prep Advised her to avoid douching  Will follow up after labs and imaging, return precautions discussed Webb Silversmith, NP  Webb Silversmith, NP This visit occurred during the SARS-CoV-2 public health emergency.  Safety protocols were in place, including screening questions prior to the visit, additional usage of staff PPE, and extensive cleaning of exam room while observing appropriate contact time as indicated for disinfecting solutions.

## 2019-01-07 DIAGNOSIS — N186 End stage renal disease: Secondary | ICD-10-CM | POA: Diagnosis not present

## 2019-01-07 DIAGNOSIS — N2581 Secondary hyperparathyroidism of renal origin: Secondary | ICD-10-CM | POA: Diagnosis not present

## 2019-01-07 DIAGNOSIS — D689 Coagulation defect, unspecified: Secondary | ICD-10-CM | POA: Diagnosis not present

## 2019-01-07 DIAGNOSIS — Z992 Dependence on renal dialysis: Secondary | ICD-10-CM | POA: Diagnosis not present

## 2019-01-08 LAB — URIC ACID: Uric Acid, Serum: 4.4 mg/dL (ref 2.5–7.0)

## 2019-01-08 LAB — SEDIMENTATION RATE: Sed Rate: 36 mm/h — ABNORMAL HIGH (ref 0–30)

## 2019-01-08 LAB — WET PREP BY MOLECULAR PROBE
Candida species: NOT DETECTED
MICRO NUMBER:: 1165013
SPECIMEN QUALITY:: ADEQUATE
Trichomonas vaginosis: NOT DETECTED

## 2019-01-08 LAB — HIGH SENSITIVITY CRP: hs-CRP: 0.8 mg/L

## 2019-01-08 LAB — ANTI-NUCLEAR AB-TITER (ANA TITER): ANA Titer 1: 1:320 {titer} — ABNORMAL HIGH

## 2019-01-08 LAB — ANA: Anti Nuclear Antibody (ANA): POSITIVE — AB

## 2019-01-10 ENCOUNTER — Other Ambulatory Visit: Payer: Self-pay | Admitting: Internal Medicine

## 2019-01-10 DIAGNOSIS — N186 End stage renal disease: Secondary | ICD-10-CM | POA: Diagnosis not present

## 2019-01-10 DIAGNOSIS — N2581 Secondary hyperparathyroidism of renal origin: Secondary | ICD-10-CM | POA: Diagnosis not present

## 2019-01-10 DIAGNOSIS — D689 Coagulation defect, unspecified: Secondary | ICD-10-CM | POA: Diagnosis not present

## 2019-01-10 DIAGNOSIS — Z992 Dependence on renal dialysis: Secondary | ICD-10-CM | POA: Diagnosis not present

## 2019-01-10 MED ORDER — METRONIDAZOLE 0.75 % VA GEL: 1.0000 | Freq: Two times a day (BID) | VAGINAL | 0 refills | Status: DC

## 2019-01-12 DIAGNOSIS — D689 Coagulation defect, unspecified: Secondary | ICD-10-CM | POA: Diagnosis not present

## 2019-01-12 DIAGNOSIS — N2581 Secondary hyperparathyroidism of renal origin: Secondary | ICD-10-CM | POA: Diagnosis not present

## 2019-01-12 DIAGNOSIS — N186 End stage renal disease: Secondary | ICD-10-CM | POA: Diagnosis not present

## 2019-01-12 DIAGNOSIS — Z992 Dependence on renal dialysis: Secondary | ICD-10-CM | POA: Diagnosis not present

## 2019-01-13 ENCOUNTER — Other Ambulatory Visit: Payer: Self-pay

## 2019-01-13 ENCOUNTER — Other Ambulatory Visit (INDEPENDENT_AMBULATORY_CARE_PROVIDER_SITE_OTHER): Payer: Medicare Other

## 2019-01-13 DIAGNOSIS — M79642 Pain in left hand: Secondary | ICD-10-CM

## 2019-01-13 DIAGNOSIS — M7989 Other specified soft tissue disorders: Secondary | ICD-10-CM

## 2019-01-13 DIAGNOSIS — R768 Other specified abnormal immunological findings in serum: Secondary | ICD-10-CM

## 2019-01-13 DIAGNOSIS — M79641 Pain in right hand: Secondary | ICD-10-CM | POA: Diagnosis not present

## 2019-01-14 DIAGNOSIS — Z992 Dependence on renal dialysis: Secondary | ICD-10-CM | POA: Diagnosis not present

## 2019-01-14 DIAGNOSIS — D689 Coagulation defect, unspecified: Secondary | ICD-10-CM | POA: Diagnosis not present

## 2019-01-14 DIAGNOSIS — N186 End stage renal disease: Secondary | ICD-10-CM | POA: Diagnosis not present

## 2019-01-14 DIAGNOSIS — N2581 Secondary hyperparathyroidism of renal origin: Secondary | ICD-10-CM | POA: Diagnosis not present

## 2019-01-14 LAB — RHEUMATOID FACTOR: Rheumatoid fact SerPl-aCnc: 20 IU/mL — ABNORMAL HIGH (ref <14)

## 2019-01-14 NOTE — Addendum Note (Signed)
Addended by: [name] on: 01/14/2019 08:44 PM   Modules accepted: Orders

## 2019-01-17 DIAGNOSIS — N186 End stage renal disease: Secondary | ICD-10-CM | POA: Diagnosis not present

## 2019-01-17 DIAGNOSIS — N2581 Secondary hyperparathyroidism of renal origin: Secondary | ICD-10-CM | POA: Diagnosis not present

## 2019-01-17 DIAGNOSIS — Z992 Dependence on renal dialysis: Secondary | ICD-10-CM | POA: Diagnosis not present

## 2019-01-17 DIAGNOSIS — D689 Coagulation defect, unspecified: Secondary | ICD-10-CM | POA: Diagnosis not present

## 2019-01-18 ENCOUNTER — Telehealth: Payer: Self-pay

## 2019-01-18 MED ORDER — METRONIDAZOLE 500 MG PO TABS: 500.0000 mg | ORAL_TABLET | Freq: Three times a day (TID) | ORAL | 0 refills | Status: DC

## 2019-01-18 NOTE — Telephone Encounter (Signed)
Called pt to discuss Rheumatology referral.  Pt also wanted to ask about getting a cheaper alternative for Metrogel. Pt states she spoke w/pharmacy and they were to contact us.

## 2019-01-18 NOTE — Telephone Encounter (Signed)
Sent in Flagyl oral- no alcohol while taking this medication.

## 2019-01-18 NOTE — Addendum Note (Signed)
Addended by: [name] on: 01/18/2019 01:54 PM   Modules accepted: Orders

## 2019-01-19 DIAGNOSIS — N186 End stage renal disease: Secondary | ICD-10-CM | POA: Diagnosis not present

## 2019-01-19 DIAGNOSIS — Z992 Dependence on renal dialysis: Secondary | ICD-10-CM | POA: Diagnosis not present

## 2019-01-19 DIAGNOSIS — D689 Coagulation defect, unspecified: Secondary | ICD-10-CM | POA: Diagnosis not present

## 2019-01-19 DIAGNOSIS — N2581 Secondary hyperparathyroidism of renal origin: Secondary | ICD-10-CM | POA: Diagnosis not present

## 2019-01-19 NOTE — Telephone Encounter (Signed)
Pt is aware as instructed and expressed understanding 

## 2019-01-21 DIAGNOSIS — N186 End stage renal disease: Secondary | ICD-10-CM | POA: Diagnosis not present

## 2019-01-21 DIAGNOSIS — D689 Coagulation defect, unspecified: Secondary | ICD-10-CM | POA: Diagnosis not present

## 2019-01-21 DIAGNOSIS — Z992 Dependence on renal dialysis: Secondary | ICD-10-CM | POA: Diagnosis not present

## 2019-01-21 DIAGNOSIS — N2581 Secondary hyperparathyroidism of renal origin: Secondary | ICD-10-CM | POA: Diagnosis not present

## 2019-01-24 ENCOUNTER — Telehealth: Payer: Self-pay

## 2019-01-24 DIAGNOSIS — Z992 Dependence on renal dialysis: Secondary | ICD-10-CM | POA: Diagnosis not present

## 2019-01-24 DIAGNOSIS — D689 Coagulation defect, unspecified: Secondary | ICD-10-CM | POA: Diagnosis not present

## 2019-01-24 DIAGNOSIS — N186 End stage renal disease: Secondary | ICD-10-CM | POA: Diagnosis not present

## 2019-01-24 DIAGNOSIS — N2581 Secondary hyperparathyroidism of renal origin: Secondary | ICD-10-CM | POA: Diagnosis not present

## 2019-01-24 NOTE — Telephone Encounter (Signed)
Pt scheduled at [facility].  Per pt too far to drive to [place].  She wants to go to [place].

## 2019-01-25 NOTE — Telephone Encounter (Signed)
ok 

## 2019-01-26 DIAGNOSIS — D689 Coagulation defect, unspecified: Secondary | ICD-10-CM | POA: Diagnosis not present

## 2019-01-26 DIAGNOSIS — Z992 Dependence on renal dialysis: Secondary | ICD-10-CM | POA: Diagnosis not present

## 2019-01-26 DIAGNOSIS — N2581 Secondary hyperparathyroidism of renal origin: Secondary | ICD-10-CM | POA: Diagnosis not present

## 2019-01-26 DIAGNOSIS — N186 End stage renal disease: Secondary | ICD-10-CM | POA: Diagnosis not present

## 2019-01-29 DIAGNOSIS — D689 Coagulation defect, unspecified: Secondary | ICD-10-CM | POA: Diagnosis not present

## 2019-01-29 DIAGNOSIS — N186 End stage renal disease: Secondary | ICD-10-CM | POA: Diagnosis not present

## 2019-01-29 DIAGNOSIS — N2581 Secondary hyperparathyroidism of renal origin: Secondary | ICD-10-CM | POA: Diagnosis not present

## 2019-01-29 DIAGNOSIS — Z992 Dependence on renal dialysis: Secondary | ICD-10-CM | POA: Diagnosis not present

## 2019-01-31 DIAGNOSIS — N186 End stage renal disease: Secondary | ICD-10-CM | POA: Diagnosis not present

## 2019-01-31 DIAGNOSIS — Z992 Dependence on renal dialysis: Secondary | ICD-10-CM | POA: Diagnosis not present

## 2019-01-31 DIAGNOSIS — D689 Coagulation defect, unspecified: Secondary | ICD-10-CM | POA: Diagnosis not present

## 2019-01-31 DIAGNOSIS — N2581 Secondary hyperparathyroidism of renal origin: Secondary | ICD-10-CM | POA: Diagnosis not present

## 2019-02-01 DIAGNOSIS — R768 Other specified abnormal immunological findings in serum: Secondary | ICD-10-CM | POA: Diagnosis not present

## 2019-02-01 DIAGNOSIS — Z992 Dependence on renal dialysis: Secondary | ICD-10-CM | POA: Diagnosis not present

## 2019-02-01 DIAGNOSIS — M79641 Pain in right hand: Secondary | ICD-10-CM | POA: Diagnosis not present

## 2019-02-01 DIAGNOSIS — M79642 Pain in left hand: Secondary | ICD-10-CM | POA: Diagnosis not present

## 2019-02-01 DIAGNOSIS — N186 End stage renal disease: Secondary | ICD-10-CM | POA: Diagnosis not present

## 2019-02-02 DIAGNOSIS — I129 Hypertensive chronic kidney disease with stage 1 through stage 4 chronic kidney disease, or unspecified chronic kidney disease: Secondary | ICD-10-CM | POA: Diagnosis not present

## 2019-02-02 DIAGNOSIS — Z992 Dependence on renal dialysis: Secondary | ICD-10-CM | POA: Diagnosis not present

## 2019-02-02 DIAGNOSIS — N2581 Secondary hyperparathyroidism of renal origin: Secondary | ICD-10-CM | POA: Diagnosis not present

## 2019-02-02 DIAGNOSIS — D689 Coagulation defect, unspecified: Secondary | ICD-10-CM | POA: Diagnosis not present

## 2019-02-02 DIAGNOSIS — N186 End stage renal disease: Secondary | ICD-10-CM | POA: Diagnosis not present

## 2019-02-05 DIAGNOSIS — N186 End stage renal disease: Secondary | ICD-10-CM | POA: Diagnosis not present

## 2019-02-05 DIAGNOSIS — Z992 Dependence on renal dialysis: Secondary | ICD-10-CM | POA: Diagnosis not present

## 2019-02-05 DIAGNOSIS — D508 Other iron deficiency anemias: Secondary | ICD-10-CM | POA: Diagnosis not present

## 2019-02-05 DIAGNOSIS — D631 Anemia in chronic kidney disease: Secondary | ICD-10-CM | POA: Diagnosis not present

## 2019-02-05 DIAGNOSIS — D689 Coagulation defect, unspecified: Secondary | ICD-10-CM | POA: Diagnosis not present

## 2019-02-05 DIAGNOSIS — N2581 Secondary hyperparathyroidism of renal origin: Secondary | ICD-10-CM | POA: Diagnosis not present

## 2019-02-07 DIAGNOSIS — Z992 Dependence on renal dialysis: Secondary | ICD-10-CM | POA: Diagnosis not present

## 2019-02-07 DIAGNOSIS — N2581 Secondary hyperparathyroidism of renal origin: Secondary | ICD-10-CM | POA: Diagnosis not present

## 2019-02-07 DIAGNOSIS — D631 Anemia in chronic kidney disease: Secondary | ICD-10-CM | POA: Diagnosis not present

## 2019-02-07 DIAGNOSIS — N186 End stage renal disease: Secondary | ICD-10-CM | POA: Diagnosis not present

## 2019-02-07 DIAGNOSIS — D689 Coagulation defect, unspecified: Secondary | ICD-10-CM | POA: Diagnosis not present

## 2019-02-07 DIAGNOSIS — D508 Other iron deficiency anemias: Secondary | ICD-10-CM | POA: Diagnosis not present

## 2019-02-09 DIAGNOSIS — Z992 Dependence on renal dialysis: Secondary | ICD-10-CM | POA: Diagnosis not present

## 2019-02-09 DIAGNOSIS — N186 End stage renal disease: Secondary | ICD-10-CM | POA: Diagnosis not present

## 2019-02-09 DIAGNOSIS — D508 Other iron deficiency anemias: Secondary | ICD-10-CM | POA: Diagnosis not present

## 2019-02-09 DIAGNOSIS — D631 Anemia in chronic kidney disease: Secondary | ICD-10-CM | POA: Diagnosis not present

## 2019-02-09 DIAGNOSIS — D689 Coagulation defect, unspecified: Secondary | ICD-10-CM | POA: Diagnosis not present

## 2019-02-09 DIAGNOSIS — N2581 Secondary hyperparathyroidism of renal origin: Secondary | ICD-10-CM | POA: Diagnosis not present

## 2019-02-11 DIAGNOSIS — D631 Anemia in chronic kidney disease: Secondary | ICD-10-CM | POA: Diagnosis not present

## 2019-02-11 DIAGNOSIS — Z992 Dependence on renal dialysis: Secondary | ICD-10-CM | POA: Diagnosis not present

## 2019-02-11 DIAGNOSIS — N186 End stage renal disease: Secondary | ICD-10-CM | POA: Diagnosis not present

## 2019-02-11 DIAGNOSIS — D508 Other iron deficiency anemias: Secondary | ICD-10-CM | POA: Diagnosis not present

## 2019-02-11 DIAGNOSIS — N2581 Secondary hyperparathyroidism of renal origin: Secondary | ICD-10-CM | POA: Diagnosis not present

## 2019-02-11 DIAGNOSIS — D689 Coagulation defect, unspecified: Secondary | ICD-10-CM | POA: Diagnosis not present

## 2019-02-14 DIAGNOSIS — N186 End stage renal disease: Secondary | ICD-10-CM | POA: Diagnosis not present

## 2019-02-14 DIAGNOSIS — N2581 Secondary hyperparathyroidism of renal origin: Secondary | ICD-10-CM | POA: Diagnosis not present

## 2019-02-14 DIAGNOSIS — D508 Other iron deficiency anemias: Secondary | ICD-10-CM | POA: Diagnosis not present

## 2019-02-14 DIAGNOSIS — D631 Anemia in chronic kidney disease: Secondary | ICD-10-CM | POA: Diagnosis not present

## 2019-02-14 DIAGNOSIS — Z992 Dependence on renal dialysis: Secondary | ICD-10-CM | POA: Diagnosis not present

## 2019-02-14 DIAGNOSIS — D689 Coagulation defect, unspecified: Secondary | ICD-10-CM | POA: Diagnosis not present

## 2019-02-16 DIAGNOSIS — N2581 Secondary hyperparathyroidism of renal origin: Secondary | ICD-10-CM | POA: Diagnosis not present

## 2019-02-16 DIAGNOSIS — D689 Coagulation defect, unspecified: Secondary | ICD-10-CM | POA: Diagnosis not present

## 2019-02-16 DIAGNOSIS — D508 Other iron deficiency anemias: Secondary | ICD-10-CM | POA: Diagnosis not present

## 2019-02-16 DIAGNOSIS — Z992 Dependence on renal dialysis: Secondary | ICD-10-CM | POA: Diagnosis not present

## 2019-02-16 DIAGNOSIS — N186 End stage renal disease: Secondary | ICD-10-CM | POA: Diagnosis not present

## 2019-02-16 DIAGNOSIS — D631 Anemia in chronic kidney disease: Secondary | ICD-10-CM | POA: Diagnosis not present

## 2019-02-17 ENCOUNTER — Encounter: Payer: Self-pay | Admitting: Occupational Therapy

## 2019-02-17 ENCOUNTER — Other Ambulatory Visit: Payer: Self-pay

## 2019-02-17 ENCOUNTER — Ambulatory Visit: Payer: Medicare Other | Attending: Internal Medicine | Admitting: Occupational Therapy

## 2019-02-17 DIAGNOSIS — M79641 Pain in right hand: Secondary | ICD-10-CM | POA: Insufficient documentation

## 2019-02-17 DIAGNOSIS — M79642 Pain in left hand: Secondary | ICD-10-CM | POA: Diagnosis not present

## 2019-02-17 DIAGNOSIS — M25641 Stiffness of right hand, not elsewhere classified: Secondary | ICD-10-CM | POA: Diagnosis not present

## 2019-02-17 DIAGNOSIS — M6281 Muscle weakness (generalized): Secondary | ICD-10-CM | POA: Diagnosis not present

## 2019-02-17 DIAGNOSIS — M25642 Stiffness of left hand, not elsewhere classified: Secondary | ICD-10-CM | POA: Diagnosis not present

## 2019-02-17 NOTE — Therapy (Signed)
[facility] [address], [place], [address] Phone: [phone]   Fax:  [phone]  Occupational Therapy Evaluation  Patient Details  Name: Kimberly Frost MRN: [id number] Date of Birth: 12/17/1950 Referring Provider (OT): bock   Encounter Date: 02/17/2019  OT End of Session - 02/17/19 1409    Visit Number  1    Number of Visits  8    Date for OT Re-Evaluation  03/17/19    OT Start Time  1201    OT Stop Time  1310    OT Time Calculation (min)  69 min    Activity Tolerance  Patient tolerated treatment well    Behavior During Therapy  [facility] for tasks assessed/performed       Past Medical History:  Diagnosis Date  . Chronic kidney disease   . Edema    RLE  . Hepatitis    Hep C  . History of blood transfusion   . Hypertension   . Wears dentures     Past Surgical History:  Procedure Laterality Date  . AV FISTULA PLACEMENT Left 06/16/2018   Procedure: Left Arm ARTERIOVENOUS (AV) FISTULA CREATION;  Surgeon: Marty Heck, MD;  Location: [facility];  Service: Vascular;  Laterality: Left;  Marland Kitchen MULTIPLE TOOTH EXTRACTIONS    . MYOMECTOMY      There were no vitals filed for this visit.  Subjective Assessment - 02/17/19 1343    Subjective   MY hands are just terrible - I cannot grip anything , cook ,brush my hair and teeth. Stop working last year and dailysis since Aug - my hands started early last year with bad swelling and pain -and now stiffness since dialysis swelling is better    Pertinent History  End-stage renal disease(noticeable last year) on hemodialysis related to uncontrolled high blood pressure diagnosed last year on hemodialysis since August 2020, pending kidney transplant. Hepatitis C disease, completed treatment a couple of years ago, OA changes , was told in past she has gout and RA numbers were elevated -    Patient Stated Goals  I want to be able to make fist  and the pain better -so I can  cook , and clean around the house - brush my hair and teeth, cut food , lift and grip things    Currently in Pain?  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[employer]  likes to cook and clean around the house -use to work long hrs       Strength   Right  Hand Grip (lbs)  18    Right Hand Lateral Pinch  6 lbs    Right Hand 3 Point Pinch  9 lbs    Left Hand Grip (lbs)  10    Left Hand Lateral Pinch  7 lbs    Left Hand 3 Point Pinch  9 lbs      Right Hand AROM   R Thumb Opposition to Index  --   WNL   R Index  MCP 0-90  90 Degrees    R Index PIP 0-100  90 Degrees    R Long  MCP 0-90  90 Degrees    R Long PIP 0-100  75 Degrees    R Ring  MCP 0-90  90 Degrees    R Ring PIP 0-100  80 Degrees    R Little  MCP 0-90  90 Degrees    R Little PIP 0-100  70 Degrees      Left Hand AROM   L Thumb Opposition to Index  --   WNL   L Index  MCP 0-90  85 Degrees    L Index PIP 0-100  65 Degrees    L Long  MCP 0-90  85 Degrees    L Long PIP 0-100  60 Degrees    L Ring  MCP 0-90  80 Degrees    L Ring PIP 0-100  70 Degrees    L Little  MCP 0-90  90 Degrees    L Little PIP 0-100  50 Degrees               OT Treatments/Exercises (OP) - 02/17/19 0001      RUE Paraffin   Number Minutes Paraffin  8 Minutes    RUE Paraffin Location  Hand    Comments  prior to review of HEP - increase ROM        LUE Paraffin   Number Minutes Paraffin  8 Minutes    LUE Paraffin Location  Hand    Comments  prior to review of HEP - increase ROM       soft tissue mobs done to [facility] spreads, joint mobs to MC's and lateral band soft tissue massage with gentle traction to all digits  Hand out review and provided:    Moist heat -or Paraffin bath Soft tissue massage  Blocked DIP flexion of all digits Tendon glides - blocked and pain free -slight pull  Composite fist to 3 cm foam block  10 reps  All pain free Opposition to all digits - 5 reps   Built up handles to ease grip on objects        OT Education - 02/17/19 1409    Education Details  findings of eval and HEP    Person(s) Educated  Patient    Methods  Explanation;Demonstration;Tactile cues;Verbal cues;Handout    Comprehension  Verbal cues required;Returned demonstration;Verbalized understanding       OT Short Term Goals - 02/17/19 1413      OT SHORT TERM GOAL #1   Title  Pt to be independent in HEP to increase flexion of digits to touch palm to hold brush    Baseline  see flowsheet for AROM -and no knowledge on HEP    Time  2    Period  Weeks    Status  New    Target Date  03/03/19        OT Long Term Goals - 02/17/19 1414  OT LONG TERM GOAL #1   Title  Pt flexion of bilateral hands increase to touch palm without increase symptoms to hold knife and toothbrush/comb    Baseline  cannot grip anything - and pain 4-8/10 with gripping , and decrease PIP 's flexion bilateral  and MC on L    Time  4    Period  Weeks    Status  New    Target Date  03/17/19      OT LONG TERM GOAL #2   Title  Bilateral grip strength increase with 15 lbs to be able to carry groceries, cut food, ,squeeze washcloth    Baseline  Grip R 18 , L 10 lbs    Time  4    Period  Weeks    Status  New    Target Date  03/17/19      OT LONG TERM GOAL #3   Title  Pain on PRHWE improve wiht more than 20 points    Baseline  pain on PRHWE at eval 38/50     Time  4    Period  Weeks    Status  New    Target Date  03/17/19            Plan - 02/17/19 1410    Clinical Impression Statement  Pt present at OT eval with diagnosis of ESRD and on hemodialysis since Aug 2020 because of uncontrolled highblood pressure - pt started having issues with swelling in hands and feet March 2020 - and lost ROM in hands - swelling is better since she started dailysis but with severe stiffness in bilateral hands with pain limiting her in her functional use of hands in ADL;s and IADL's    OT Occupational Profile and History  Problem Focused Assessment - Including review of records relating to presenting problem    Occupational performance deficits (Please refer to evaluation for details):  ADL's;IADL's;Work;Play;Leisure;Social Participation    Body Structure / Function / Physical Skills  ADL;Decreased knowledge of use of DME;FMC;Flexibility;ROM;UE functional use;Dexterity;Pain;Strength;IADL    Rehab Potential  Good    Clinical Decision Making  Limited treatment options, no task modification necessary    Comorbidities Affecting Occupational Performance:  None    Modification or Assistance to Complete Evaluation   No modification of tasks or assist necessary to complete eval    OT Frequency  2x / week    OT Duration  4 weeks    OT Treatment/Interventions  Self-care/ADL training;Therapeutic exercise;Patient/family education;Paraffin;Fluidtherapy;Contrast Bath;DME and/or AE instruction;Manual Therapy;Passive range of motion    Plan  assess progress in HEP and changes as needed    OT Home Exercise Plan  see pt instruction       Patient will benefit from skilled therapeutic intervention in order to improve the following deficits and impairments:   Body Structure / Function / Physical Skills: ADL, Decreased knowledge of use of DME, FMC, Flexibility, ROM, UE functional use, Dexterity, Pain, Strength, IADL       Visit Diagnosis: Stiffness of left hand, not  elsewhere classified - Plan: Ot plan of care cert/re-cert  Stiffness of right hand, not elsewhere classified - Plan: Ot plan of care cert/re-cert  Pain in left hand - Plan: Ot plan of care cert/re-cert  Pain in right hand - Plan: Ot plan of care cert/re-cert  Muscle weakness (generalized) - Plan: Ot plan of care cert/re-cert    Problem List Patient Active Problem List   Diagnosis Date Noted  . History of anemia  due to CKD 10/26/2018  . ESRD (end stage renal disease) ([place]) 10/26/2018  . Hyperparathyroidism ([place]) 10/26/2018  . CKD (chronic kidney disease), stage V ([place]) 06/07/2018  . Anemia due to stage 5 chronic kidney disease ([place]) 02/26/2018  . Hepatitis C 02/15/2013  . Cannabis abuse 12/26/2008  . Essential hypertension, benign 12/26/2008    Rosalyn Gess  OTR/L,CLT 02/17/2019, 2:19 PM  [facility] [address], [place], [address] Phone: [phone]   Fax:  [phone]  Name: Kimberly Frost MRN: [id number] Date of Birth: 15-Apr-1950

## 2019-02-17 NOTE — Patient Instructions (Signed)
Moist heat -or Paraffin bath Soft tissue massage  Blocked DIP flexion of all digits Tendon glides - blocked and pain free -slight pull  Composite fist to 3 cm foam block  10 reps  All pain free Opposition to all digits - 5 reps   Built up handles to ease grip on objects

## 2019-02-18 DIAGNOSIS — N2581 Secondary hyperparathyroidism of renal origin: Secondary | ICD-10-CM | POA: Diagnosis not present

## 2019-02-18 DIAGNOSIS — D689 Coagulation defect, unspecified: Secondary | ICD-10-CM | POA: Diagnosis not present

## 2019-02-18 DIAGNOSIS — D631 Anemia in chronic kidney disease: Secondary | ICD-10-CM | POA: Diagnosis not present

## 2019-02-18 DIAGNOSIS — Z992 Dependence on renal dialysis: Secondary | ICD-10-CM | POA: Diagnosis not present

## 2019-02-18 DIAGNOSIS — D508 Other iron deficiency anemias: Secondary | ICD-10-CM | POA: Diagnosis not present

## 2019-02-18 DIAGNOSIS — N186 End stage renal disease: Secondary | ICD-10-CM | POA: Diagnosis not present

## 2019-02-21 DIAGNOSIS — N2581 Secondary hyperparathyroidism of renal origin: Secondary | ICD-10-CM | POA: Diagnosis not present

## 2019-02-21 DIAGNOSIS — N186 End stage renal disease: Secondary | ICD-10-CM | POA: Diagnosis not present

## 2019-02-21 DIAGNOSIS — D508 Other iron deficiency anemias: Secondary | ICD-10-CM | POA: Diagnosis not present

## 2019-02-21 DIAGNOSIS — Z992 Dependence on renal dialysis: Secondary | ICD-10-CM | POA: Diagnosis not present

## 2019-02-21 DIAGNOSIS — D689 Coagulation defect, unspecified: Secondary | ICD-10-CM | POA: Diagnosis not present

## 2019-02-21 DIAGNOSIS — D631 Anemia in chronic kidney disease: Secondary | ICD-10-CM | POA: Diagnosis not present

## 2019-02-23 DIAGNOSIS — D631 Anemia in chronic kidney disease: Secondary | ICD-10-CM | POA: Diagnosis not present

## 2019-02-23 DIAGNOSIS — N186 End stage renal disease: Secondary | ICD-10-CM | POA: Diagnosis not present

## 2019-02-23 DIAGNOSIS — N2581 Secondary hyperparathyroidism of renal origin: Secondary | ICD-10-CM | POA: Diagnosis not present

## 2019-02-23 DIAGNOSIS — Z992 Dependence on renal dialysis: Secondary | ICD-10-CM | POA: Diagnosis not present

## 2019-02-23 DIAGNOSIS — D689 Coagulation defect, unspecified: Secondary | ICD-10-CM | POA: Diagnosis not present

## 2019-02-23 DIAGNOSIS — D508 Other iron deficiency anemias: Secondary | ICD-10-CM | POA: Diagnosis not present

## 2019-02-24 ENCOUNTER — Ambulatory Visit: Payer: Medicare Other | Admitting: Occupational Therapy

## 2019-02-25 DIAGNOSIS — D508 Other iron deficiency anemias: Secondary | ICD-10-CM | POA: Diagnosis not present

## 2019-02-25 DIAGNOSIS — D631 Anemia in chronic kidney disease: Secondary | ICD-10-CM | POA: Diagnosis not present

## 2019-02-25 DIAGNOSIS — N186 End stage renal disease: Secondary | ICD-10-CM | POA: Diagnosis not present

## 2019-02-25 DIAGNOSIS — N2581 Secondary hyperparathyroidism of renal origin: Secondary | ICD-10-CM | POA: Diagnosis not present

## 2019-02-25 DIAGNOSIS — D689 Coagulation defect, unspecified: Secondary | ICD-10-CM | POA: Diagnosis not present

## 2019-02-25 DIAGNOSIS — Z992 Dependence on renal dialysis: Secondary | ICD-10-CM | POA: Diagnosis not present

## 2019-02-28 DIAGNOSIS — N2581 Secondary hyperparathyroidism of renal origin: Secondary | ICD-10-CM | POA: Diagnosis not present

## 2019-02-28 DIAGNOSIS — D689 Coagulation defect, unspecified: Secondary | ICD-10-CM | POA: Diagnosis not present

## 2019-02-28 DIAGNOSIS — N186 End stage renal disease: Secondary | ICD-10-CM | POA: Diagnosis not present

## 2019-02-28 DIAGNOSIS — Z992 Dependence on renal dialysis: Secondary | ICD-10-CM | POA: Diagnosis not present

## 2019-02-28 DIAGNOSIS — D631 Anemia in chronic kidney disease: Secondary | ICD-10-CM | POA: Diagnosis not present

## 2019-02-28 DIAGNOSIS — D508 Other iron deficiency anemias: Secondary | ICD-10-CM | POA: Diagnosis not present

## 2019-03-02 DIAGNOSIS — D508 Other iron deficiency anemias: Secondary | ICD-10-CM | POA: Diagnosis not present

## 2019-03-02 DIAGNOSIS — N2581 Secondary hyperparathyroidism of renal origin: Secondary | ICD-10-CM | POA: Diagnosis not present

## 2019-03-02 DIAGNOSIS — Z992 Dependence on renal dialysis: Secondary | ICD-10-CM | POA: Diagnosis not present

## 2019-03-02 DIAGNOSIS — N186 End stage renal disease: Secondary | ICD-10-CM | POA: Diagnosis not present

## 2019-03-02 DIAGNOSIS — D631 Anemia in chronic kidney disease: Secondary | ICD-10-CM | POA: Diagnosis not present

## 2019-03-02 DIAGNOSIS — D689 Coagulation defect, unspecified: Secondary | ICD-10-CM | POA: Diagnosis not present

## 2019-03-04 DIAGNOSIS — D508 Other iron deficiency anemias: Secondary | ICD-10-CM | POA: Diagnosis not present

## 2019-03-04 DIAGNOSIS — N186 End stage renal disease: Secondary | ICD-10-CM | POA: Diagnosis not present

## 2019-03-04 DIAGNOSIS — Z992 Dependence on renal dialysis: Secondary | ICD-10-CM | POA: Diagnosis not present

## 2019-03-04 DIAGNOSIS — N2581 Secondary hyperparathyroidism of renal origin: Secondary | ICD-10-CM | POA: Diagnosis not present

## 2019-03-04 DIAGNOSIS — D631 Anemia in chronic kidney disease: Secondary | ICD-10-CM | POA: Diagnosis not present

## 2019-03-04 DIAGNOSIS — D689 Coagulation defect, unspecified: Secondary | ICD-10-CM | POA: Diagnosis not present

## 2019-03-05 DIAGNOSIS — I129 Hypertensive chronic kidney disease with stage 1 through stage 4 chronic kidney disease, or unspecified chronic kidney disease: Secondary | ICD-10-CM | POA: Diagnosis not present

## 2019-03-05 DIAGNOSIS — N186 End stage renal disease: Secondary | ICD-10-CM | POA: Diagnosis not present

## 2019-03-05 DIAGNOSIS — Z992 Dependence on renal dialysis: Secondary | ICD-10-CM | POA: Diagnosis not present

## 2019-03-07 DIAGNOSIS — I9589 Other hypotension: Secondary | ICD-10-CM | POA: Diagnosis not present

## 2019-03-07 DIAGNOSIS — N186 End stage renal disease: Secondary | ICD-10-CM | POA: Diagnosis not present

## 2019-03-07 DIAGNOSIS — Z23 Encounter for immunization: Secondary | ICD-10-CM | POA: Diagnosis not present

## 2019-03-07 DIAGNOSIS — D689 Coagulation defect, unspecified: Secondary | ICD-10-CM | POA: Diagnosis not present

## 2019-03-07 DIAGNOSIS — D508 Other iron deficiency anemias: Secondary | ICD-10-CM | POA: Diagnosis not present

## 2019-03-07 DIAGNOSIS — N2581 Secondary hyperparathyroidism of renal origin: Secondary | ICD-10-CM | POA: Diagnosis not present

## 2019-03-07 DIAGNOSIS — D631 Anemia in chronic kidney disease: Secondary | ICD-10-CM | POA: Diagnosis not present

## 2019-03-07 DIAGNOSIS — Z992 Dependence on renal dialysis: Secondary | ICD-10-CM | POA: Diagnosis not present

## 2019-03-09 DIAGNOSIS — Z992 Dependence on renal dialysis: Secondary | ICD-10-CM | POA: Diagnosis not present

## 2019-03-09 DIAGNOSIS — N2581 Secondary hyperparathyroidism of renal origin: Secondary | ICD-10-CM | POA: Diagnosis not present

## 2019-03-09 DIAGNOSIS — N186 End stage renal disease: Secondary | ICD-10-CM | POA: Diagnosis not present

## 2019-03-09 DIAGNOSIS — D689 Coagulation defect, unspecified: Secondary | ICD-10-CM | POA: Diagnosis not present

## 2019-03-09 DIAGNOSIS — Z23 Encounter for immunization: Secondary | ICD-10-CM | POA: Diagnosis not present

## 2019-03-09 DIAGNOSIS — D631 Anemia in chronic kidney disease: Secondary | ICD-10-CM | POA: Diagnosis not present

## 2019-03-09 DIAGNOSIS — I9589 Other hypotension: Secondary | ICD-10-CM | POA: Diagnosis not present

## 2019-03-09 DIAGNOSIS — D508 Other iron deficiency anemias: Secondary | ICD-10-CM | POA: Diagnosis not present

## 2019-03-11 DIAGNOSIS — N186 End stage renal disease: Secondary | ICD-10-CM | POA: Diagnosis not present

## 2019-03-11 DIAGNOSIS — N2581 Secondary hyperparathyroidism of renal origin: Secondary | ICD-10-CM | POA: Diagnosis not present

## 2019-03-11 DIAGNOSIS — D689 Coagulation defect, unspecified: Secondary | ICD-10-CM | POA: Diagnosis not present

## 2019-03-11 DIAGNOSIS — Z23 Encounter for immunization: Secondary | ICD-10-CM | POA: Diagnosis not present

## 2019-03-11 DIAGNOSIS — Z992 Dependence on renal dialysis: Secondary | ICD-10-CM | POA: Diagnosis not present

## 2019-03-11 DIAGNOSIS — I9589 Other hypotension: Secondary | ICD-10-CM | POA: Diagnosis not present

## 2019-03-11 DIAGNOSIS — D508 Other iron deficiency anemias: Secondary | ICD-10-CM | POA: Diagnosis not present

## 2019-03-11 DIAGNOSIS — D631 Anemia in chronic kidney disease: Secondary | ICD-10-CM | POA: Diagnosis not present

## 2019-03-14 DIAGNOSIS — D508 Other iron deficiency anemias: Secondary | ICD-10-CM | POA: Diagnosis not present

## 2019-03-14 DIAGNOSIS — Z992 Dependence on renal dialysis: Secondary | ICD-10-CM | POA: Diagnosis not present

## 2019-03-14 DIAGNOSIS — D631 Anemia in chronic kidney disease: Secondary | ICD-10-CM | POA: Diagnosis not present

## 2019-03-14 DIAGNOSIS — N186 End stage renal disease: Secondary | ICD-10-CM | POA: Diagnosis not present

## 2019-03-14 DIAGNOSIS — Z23 Encounter for immunization: Secondary | ICD-10-CM | POA: Diagnosis not present

## 2019-03-14 DIAGNOSIS — D689 Coagulation defect, unspecified: Secondary | ICD-10-CM | POA: Diagnosis not present

## 2019-03-14 DIAGNOSIS — N2581 Secondary hyperparathyroidism of renal origin: Secondary | ICD-10-CM | POA: Diagnosis not present

## 2019-03-14 DIAGNOSIS — I9589 Other hypotension: Secondary | ICD-10-CM | POA: Diagnosis not present

## 2019-03-16 DIAGNOSIS — D631 Anemia in chronic kidney disease: Secondary | ICD-10-CM | POA: Diagnosis not present

## 2019-03-16 DIAGNOSIS — N2581 Secondary hyperparathyroidism of renal origin: Secondary | ICD-10-CM | POA: Diagnosis not present

## 2019-03-16 DIAGNOSIS — Z992 Dependence on renal dialysis: Secondary | ICD-10-CM | POA: Diagnosis not present

## 2019-03-16 DIAGNOSIS — D689 Coagulation defect, unspecified: Secondary | ICD-10-CM | POA: Diagnosis not present

## 2019-03-16 DIAGNOSIS — D508 Other iron deficiency anemias: Secondary | ICD-10-CM | POA: Diagnosis not present

## 2019-03-16 DIAGNOSIS — I9589 Other hypotension: Secondary | ICD-10-CM | POA: Diagnosis not present

## 2019-03-16 DIAGNOSIS — Z23 Encounter for immunization: Secondary | ICD-10-CM | POA: Diagnosis not present

## 2019-03-16 DIAGNOSIS — N186 End stage renal disease: Secondary | ICD-10-CM | POA: Diagnosis not present

## 2019-03-18 DIAGNOSIS — N2581 Secondary hyperparathyroidism of renal origin: Secondary | ICD-10-CM | POA: Diagnosis not present

## 2019-03-18 DIAGNOSIS — N186 End stage renal disease: Secondary | ICD-10-CM | POA: Diagnosis not present

## 2019-03-18 DIAGNOSIS — Z23 Encounter for immunization: Secondary | ICD-10-CM | POA: Diagnosis not present

## 2019-03-18 DIAGNOSIS — I9589 Other hypotension: Secondary | ICD-10-CM | POA: Diagnosis not present

## 2019-03-18 DIAGNOSIS — D631 Anemia in chronic kidney disease: Secondary | ICD-10-CM | POA: Diagnosis not present

## 2019-03-18 DIAGNOSIS — D689 Coagulation defect, unspecified: Secondary | ICD-10-CM | POA: Diagnosis not present

## 2019-03-18 DIAGNOSIS — Z992 Dependence on renal dialysis: Secondary | ICD-10-CM | POA: Diagnosis not present

## 2019-03-18 DIAGNOSIS — D508 Other iron deficiency anemias: Secondary | ICD-10-CM | POA: Diagnosis not present

## 2019-03-21 DIAGNOSIS — Z23 Encounter for immunization: Secondary | ICD-10-CM | POA: Diagnosis not present

## 2019-03-21 DIAGNOSIS — D508 Other iron deficiency anemias: Secondary | ICD-10-CM | POA: Diagnosis not present

## 2019-03-21 DIAGNOSIS — Z992 Dependence on renal dialysis: Secondary | ICD-10-CM | POA: Diagnosis not present

## 2019-03-21 DIAGNOSIS — D631 Anemia in chronic kidney disease: Secondary | ICD-10-CM | POA: Diagnosis not present

## 2019-03-21 DIAGNOSIS — I9589 Other hypotension: Secondary | ICD-10-CM | POA: Diagnosis not present

## 2019-03-21 DIAGNOSIS — D689 Coagulation defect, unspecified: Secondary | ICD-10-CM | POA: Diagnosis not present

## 2019-03-21 DIAGNOSIS — N186 End stage renal disease: Secondary | ICD-10-CM | POA: Diagnosis not present

## 2019-03-21 DIAGNOSIS — N2581 Secondary hyperparathyroidism of renal origin: Secondary | ICD-10-CM | POA: Diagnosis not present

## 2019-03-23 DIAGNOSIS — Z23 Encounter for immunization: Secondary | ICD-10-CM | POA: Diagnosis not present

## 2019-03-23 DIAGNOSIS — D689 Coagulation defect, unspecified: Secondary | ICD-10-CM | POA: Diagnosis not present

## 2019-03-23 DIAGNOSIS — N2581 Secondary hyperparathyroidism of renal origin: Secondary | ICD-10-CM | POA: Diagnosis not present

## 2019-03-23 DIAGNOSIS — I9589 Other hypotension: Secondary | ICD-10-CM | POA: Diagnosis not present

## 2019-03-23 DIAGNOSIS — D508 Other iron deficiency anemias: Secondary | ICD-10-CM | POA: Diagnosis not present

## 2019-03-23 DIAGNOSIS — D631 Anemia in chronic kidney disease: Secondary | ICD-10-CM | POA: Diagnosis not present

## 2019-03-23 DIAGNOSIS — N186 End stage renal disease: Secondary | ICD-10-CM | POA: Diagnosis not present

## 2019-03-23 DIAGNOSIS — Z992 Dependence on renal dialysis: Secondary | ICD-10-CM | POA: Diagnosis not present

## 2019-03-25 DIAGNOSIS — D689 Coagulation defect, unspecified: Secondary | ICD-10-CM | POA: Diagnosis not present

## 2019-03-25 DIAGNOSIS — N186 End stage renal disease: Secondary | ICD-10-CM | POA: Diagnosis not present

## 2019-03-25 DIAGNOSIS — Z23 Encounter for immunization: Secondary | ICD-10-CM | POA: Diagnosis not present

## 2019-03-25 DIAGNOSIS — Z992 Dependence on renal dialysis: Secondary | ICD-10-CM | POA: Diagnosis not present

## 2019-03-25 DIAGNOSIS — D631 Anemia in chronic kidney disease: Secondary | ICD-10-CM | POA: Diagnosis not present

## 2019-03-25 DIAGNOSIS — D508 Other iron deficiency anemias: Secondary | ICD-10-CM | POA: Diagnosis not present

## 2019-03-25 DIAGNOSIS — I9589 Other hypotension: Secondary | ICD-10-CM | POA: Diagnosis not present

## 2019-03-25 DIAGNOSIS — N2581 Secondary hyperparathyroidism of renal origin: Secondary | ICD-10-CM | POA: Diagnosis not present

## 2019-03-27 ENCOUNTER — Telehealth: Payer: Self-pay

## 2019-03-27 NOTE — Telephone Encounter (Signed)
ECHO

## 2019-03-28 DIAGNOSIS — N186 End stage renal disease: Secondary | ICD-10-CM | POA: Diagnosis not present

## 2019-03-28 DIAGNOSIS — Z992 Dependence on renal dialysis: Secondary | ICD-10-CM | POA: Diagnosis not present

## 2019-03-28 DIAGNOSIS — Z23 Encounter for immunization: Secondary | ICD-10-CM | POA: Diagnosis not present

## 2019-03-28 DIAGNOSIS — N2581 Secondary hyperparathyroidism of renal origin: Secondary | ICD-10-CM | POA: Diagnosis not present

## 2019-03-28 DIAGNOSIS — D508 Other iron deficiency anemias: Secondary | ICD-10-CM | POA: Diagnosis not present

## 2019-03-28 DIAGNOSIS — I9589 Other hypotension: Secondary | ICD-10-CM | POA: Diagnosis not present

## 2019-03-28 DIAGNOSIS — D631 Anemia in chronic kidney disease: Secondary | ICD-10-CM | POA: Diagnosis not present

## 2019-03-28 DIAGNOSIS — D689 Coagulation defect, unspecified: Secondary | ICD-10-CM | POA: Diagnosis not present

## 2019-03-30 DIAGNOSIS — D508 Other iron deficiency anemias: Secondary | ICD-10-CM | POA: Diagnosis not present

## 2019-03-30 DIAGNOSIS — I9589 Other hypotension: Secondary | ICD-10-CM | POA: Diagnosis not present

## 2019-03-30 DIAGNOSIS — Z23 Encounter for immunization: Secondary | ICD-10-CM | POA: Diagnosis not present

## 2019-03-30 DIAGNOSIS — D689 Coagulation defect, unspecified: Secondary | ICD-10-CM | POA: Diagnosis not present

## 2019-03-30 DIAGNOSIS — N186 End stage renal disease: Secondary | ICD-10-CM | POA: Diagnosis not present

## 2019-03-30 DIAGNOSIS — N2581 Secondary hyperparathyroidism of renal origin: Secondary | ICD-10-CM | POA: Diagnosis not present

## 2019-03-30 DIAGNOSIS — Z992 Dependence on renal dialysis: Secondary | ICD-10-CM | POA: Diagnosis not present

## 2019-03-30 DIAGNOSIS — D631 Anemia in chronic kidney disease: Secondary | ICD-10-CM | POA: Diagnosis not present

## 2019-04-01 DIAGNOSIS — D689 Coagulation defect, unspecified: Secondary | ICD-10-CM | POA: Diagnosis not present

## 2019-04-01 DIAGNOSIS — Z992 Dependence on renal dialysis: Secondary | ICD-10-CM | POA: Diagnosis not present

## 2019-04-01 DIAGNOSIS — N186 End stage renal disease: Secondary | ICD-10-CM | POA: Diagnosis not present

## 2019-04-01 DIAGNOSIS — D631 Anemia in chronic kidney disease: Secondary | ICD-10-CM | POA: Diagnosis not present

## 2019-04-01 DIAGNOSIS — Z23 Encounter for immunization: Secondary | ICD-10-CM | POA: Diagnosis not present

## 2019-04-01 DIAGNOSIS — I9589 Other hypotension: Secondary | ICD-10-CM | POA: Diagnosis not present

## 2019-04-01 DIAGNOSIS — D508 Other iron deficiency anemias: Secondary | ICD-10-CM | POA: Diagnosis not present

## 2019-04-01 DIAGNOSIS — N2581 Secondary hyperparathyroidism of renal origin: Secondary | ICD-10-CM | POA: Diagnosis not present

## 2019-04-02 DIAGNOSIS — Z992 Dependence on renal dialysis: Secondary | ICD-10-CM | POA: Diagnosis not present

## 2019-04-02 DIAGNOSIS — I129 Hypertensive chronic kidney disease with stage 1 through stage 4 chronic kidney disease, or unspecified chronic kidney disease: Secondary | ICD-10-CM | POA: Diagnosis not present

## 2019-04-02 DIAGNOSIS — N186 End stage renal disease: Secondary | ICD-10-CM | POA: Diagnosis not present

## 2019-04-03 ENCOUNTER — Encounter (INDEPENDENT_AMBULATORY_CARE_PROVIDER_SITE_OTHER): Payer: Self-pay

## 2019-04-03 ENCOUNTER — Telehealth (INDEPENDENT_AMBULATORY_CARE_PROVIDER_SITE_OTHER): Payer: Self-pay

## 2019-04-03 ENCOUNTER — Ambulatory Visit: Payer: Medicare Other | Admitting: Internal Medicine

## 2019-04-03 NOTE — Telephone Encounter (Signed)
A fax was received from [facility] for the patient to have a fistulagram. Patient is now scheduled with Dr. Delana Meyer for a left arm fistulagram on 04/05/19 with a 11:30 am arrival to the [facility]. Patient will do covid testing on 04/03/19 between 12:30-2:30 pm. Pre-procedure instructions will be faxed back to [facility] as requested.

## 2019-04-04 ENCOUNTER — Ambulatory Visit: Payer: Medicare Other | Admitting: Internal Medicine

## 2019-04-04 DIAGNOSIS — Z992 Dependence on renal dialysis: Secondary | ICD-10-CM | POA: Diagnosis not present

## 2019-04-04 DIAGNOSIS — D631 Anemia in chronic kidney disease: Secondary | ICD-10-CM | POA: Diagnosis not present

## 2019-04-04 DIAGNOSIS — N186 End stage renal disease: Secondary | ICD-10-CM | POA: Diagnosis not present

## 2019-04-04 DIAGNOSIS — N2581 Secondary hyperparathyroidism of renal origin: Secondary | ICD-10-CM | POA: Diagnosis not present

## 2019-04-04 DIAGNOSIS — D508 Other iron deficiency anemias: Secondary | ICD-10-CM | POA: Diagnosis not present

## 2019-04-04 DIAGNOSIS — D689 Coagulation defect, unspecified: Secondary | ICD-10-CM | POA: Diagnosis not present

## 2019-04-04 NOTE — Telephone Encounter (Signed)
I received a chat message that the patient did not have her covid test on 04/03/19. I attempted to contact the patient and had to leave a message for a return call.

## 2019-04-04 NOTE — Telephone Encounter (Signed)
Patient called back and she has been rescheduled to 04/11/19 with a 6:45 am arrival time to the [facility]. Patient will do covid testing on 04/07/19 between 12:30-2:30 pm.

## 2019-04-05 ENCOUNTER — Ambulatory Visit: Payer: Medicare Other | Admitting: Internal Medicine

## 2019-04-05 ENCOUNTER — Other Ambulatory Visit (INDEPENDENT_AMBULATORY_CARE_PROVIDER_SITE_OTHER): Payer: Self-pay | Admitting: Nurse Practitioner

## 2019-04-06 DIAGNOSIS — N2581 Secondary hyperparathyroidism of renal origin: Secondary | ICD-10-CM | POA: Diagnosis not present

## 2019-04-06 DIAGNOSIS — D508 Other iron deficiency anemias: Secondary | ICD-10-CM | POA: Diagnosis not present

## 2019-04-06 DIAGNOSIS — N186 End stage renal disease: Secondary | ICD-10-CM | POA: Diagnosis not present

## 2019-04-06 DIAGNOSIS — D689 Coagulation defect, unspecified: Secondary | ICD-10-CM | POA: Diagnosis not present

## 2019-04-06 DIAGNOSIS — Z992 Dependence on renal dialysis: Secondary | ICD-10-CM | POA: Diagnosis not present

## 2019-04-06 DIAGNOSIS — D631 Anemia in chronic kidney disease: Secondary | ICD-10-CM | POA: Diagnosis not present

## 2019-04-07 ENCOUNTER — Other Ambulatory Visit: Admission: RE | Admit: 2019-04-07 | Payer: Medicare Other | Source: Ambulatory Visit

## 2019-04-07 ENCOUNTER — Telehealth (INDEPENDENT_AMBULATORY_CARE_PROVIDER_SITE_OTHER): Payer: Self-pay

## 2019-04-07 NOTE — Telephone Encounter (Signed)
Patient called to find out what time was she to be at the hospital on 04/11/19, I gave her the time of 6:45 am to be at the [facility]. I then asked the patient if she had her covid test and she did not do so thinking she was to have the test after her procedure on 04/11/19. I explained to the patient that her covid test is before her test. I asked the patient if she could have go on 04/10/19 to have her test before 11:00 am and the patient stated she would do so. I also explained that without that test on 04/10/19 she will not have her procedure on 04/11/19.

## 2019-04-08 DIAGNOSIS — N2581 Secondary hyperparathyroidism of renal origin: Secondary | ICD-10-CM | POA: Diagnosis not present

## 2019-04-08 DIAGNOSIS — D508 Other iron deficiency anemias: Secondary | ICD-10-CM | POA: Diagnosis not present

## 2019-04-08 DIAGNOSIS — D631 Anemia in chronic kidney disease: Secondary | ICD-10-CM | POA: Diagnosis not present

## 2019-04-08 DIAGNOSIS — D689 Coagulation defect, unspecified: Secondary | ICD-10-CM | POA: Diagnosis not present

## 2019-04-08 DIAGNOSIS — N186 End stage renal disease: Secondary | ICD-10-CM | POA: Diagnosis not present

## 2019-04-08 DIAGNOSIS — Z992 Dependence on renal dialysis: Secondary | ICD-10-CM | POA: Diagnosis not present

## 2019-04-10 ENCOUNTER — Ambulatory Visit: Payer: Medicare Other | Admitting: Internal Medicine

## 2019-04-10 ENCOUNTER — Other Ambulatory Visit
Admission: RE | Admit: 2019-04-10 | Discharge: 2019-04-10 | Disposition: A | Discharge status: Home / Self Care | Payer: Medicare Other | Source: Ambulatory Visit | Attending: Vascular Surgery | Admitting: Vascular Surgery

## 2019-04-10 DIAGNOSIS — Z20822 Contact with and (suspected) exposure to covid-19: Secondary | ICD-10-CM | POA: Insufficient documentation

## 2019-04-10 DIAGNOSIS — Z01812 Encounter for preprocedural laboratory examination: Secondary | ICD-10-CM | POA: Insufficient documentation

## 2019-04-10 LAB — SARS CORONAVIRUS 2 (TAT 6-24 HRS): SARS Coronavirus 2: NEGATIVE

## 2019-04-10 NOTE — Progress Notes (Deleted)
Subjective:    Patient ID: Kimberly Frost, female    DOB: 12/07/1950, 69 y.o.   MRN: [id number]  HPI  Pt presents to the clinic today to discuss being referred for a stress test by [facility].  Review of Systems      Past Medical History:  Diagnosis Date  . Chronic kidney disease   . Edema    RLE  . Hepatitis    Hep C  . History of blood transfusion   . Hypertension   . Wears dentures     Current Outpatient Medications  Medication Sig Dispense Refill  . acetaminophen (TYLENOL) 500 MG tablet Take 1,000 mg by mouth every 6 (six) hours as needed for moderate pain or headache.    . metroNIDAZOLE (METROGEL VAGINAL) 0.75 % vaginal gel Place 1 Applicatorful vaginally 2 (two) times daily. (Patient not taking: Reported on 04/03/2019) 70 g 0  . midodrine (PROAMATINE) 10 MG tablet Take 10 mg by mouth in the morning and at bedtime.    . multivitamin (RENA-VIT) TABS tablet Take 1 tablet by mouth daily.    . VELPHORO 500 MG chewable tablet Chew 1,000 mg by mouth See admin instructions. Take 1000 mg with meals and 500 mg with snacks     No current facility-administered medications for this visit.    No Known Allergies  Family History  Problem Relation Age of Onset  . Diabetes Mother   . Hypertension Mother   . Bone cancer Father   . Kidney cancer Maternal Grandmother   . Stroke Brother   . Diabetes Brother     Social History   Socioeconomic History  . Marital status: Single    Spouse name: Not on file  . Number of children: Not on file  . Years of education: Not on file  . Highest education level: Not on file  Occupational History  . Not on file  Tobacco Use  . Smoking status: Former Smoker    Types: Cigarettes  . Smokeless tobacco: Never Used  Substance and Sexual Activity  . Alcohol use: Not Currently    Alcohol/week: 6.0 standard drinks    Types: 6 Cans of beer per week  . Drug use: Yes    Types: Marijuana    Comment: occasional   . Sexual  activity: Not Currently  Other Topics Concern  . Not on file  Social History Narrative  . Not on file   Social Determinants of Health   Financial Resource Strain:   . Difficulty of Paying Living Expenses: Not on file  Food Insecurity:   . Worried About Charity fundraiser in the Last Year: Not on file  . Ran Out of Food in the Last Year: Not on file  Transportation Needs:   . Lack of Transportation (Medical): Not on file  . Lack of Transportation (Non-Medical): Not on file  Physical Activity:   . Days of Exercise per Week: Not on file  . Minutes of Exercise per Session: Not on file  Stress:   . Feeling of Stress : Not on file  Social Connections:   . Frequency of Communication with Friends and Family: Not on file  . Frequency of Social Gatherings with Friends and Family: Not on file  . Attends Religious Services: Not on file  . Active Member of Clubs or Organizations: Not on file  . Attends Archivist Meetings: Not on file  . Marital Status: Not on file  Intimate Partner Violence:   .  Fear of Current or Ex-Partner: Not on file  . Emotionally Abused: Not on file  . Physically Abused: Not on file  . Sexually Abused: Not on file     Constitutional: Denies fever, malaise, fatigue, headache or abrupt weight changes.  HEENT: Denies eye pain, eye redness, ear pain, ringing in the ears, wax buildup, runny nose, nasal congestion, bloody nose, or sore throat. Respiratory: Denies difficulty breathing, shortness of breath, cough or sputum production.   Cardiovascular: Denies chest pain, chest tightness, palpitations or swelling in the hands or feet.  Gastrointestinal: Denies abdominal pain, bloating, constipation, diarrhea or blood in the stool.  GU: Denies urgency, frequency, pain with urination, burning sensation, blood in urine, odor or discharge. Musculoskeletal: Denies decrease in range of motion, difficulty with gait, muscle pain or joint pain and swelling.  Skin:  Denies redness, rashes, lesions or ulcercations.  Neurological: Denies dizziness, difficulty with memory, difficulty with speech or problems with balance and coordination.  Psych: Denies anxiety, depression, SI/HI.  No other specific complaints in a complete review of systems (except as listed in HPI above).  Objective:   Physical Exam  There were no vitals taken for this visit. Wt Readings from Last 3 Encounters:  01/06/19 118 lb (53.5 kg)  07/22/18 114 lb (51.7 kg)  07/19/18 113 lb (51.3 kg)    General: Appears their stated age, well developed, well nourished in NAD. Skin: Warm, dry and intact. No rashes, lesions or ulcerations noted. HEENT: Head: normal shape and size; Eyes: sclera white, no icterus, conjunctiva pink, PERRLA and EOMs intact; Ears: Tm's gray and intact, normal light reflex; Nose: mucosa pink and moist, septum midline; Throat/Mouth: Teeth present, mucosa pink and moist, no exudate, lesions or ulcerations noted.  Neck:  Neck supple, trachea midline. No masses, lumps or thyromegaly present.  Cardiovascular: Normal rate and rhythm. S1,S2 noted.  No murmur, rubs or gallops noted. No JVD or BLE edema. No carotid bruits noted. Pulmonary/Chest: Normal effort and positive vesicular breath sounds. No respiratory distress. No wheezes, rales or ronchi noted.  Abdomen: Soft and nontender. Normal bowel sounds. No distention or masses noted. Liver, spleen and kidneys non palpable. Musculoskeletal: Normal range of motion. No signs of joint swelling. No difficulty with gait.  Neurological: Alert and oriented. Cranial nerves II-XII grossly intact. Coordination normal.  Psychiatric: Mood and affect normal. Behavior is normal. Judgment and thought content normal.   EKG:  BMET    Component Value Date/Time   NA 138 06/23/2018 0000   K 4.9 06/23/2018 0000   CL 108 03/09/2018 1650   CO2 19 03/09/2018 1650   GLUCOSE 95 06/16/2018 1103   BUN 61 06/23/2018 0000   CREATININE 7.13  06/23/2018 0000   CALCIUM 8.8 06/23/2018 0000   GFRNONAA 9 (L) 02/27/2018 0533   GFRAA 6 06/23/2018 0000    Lipid Panel     Component Value Date/Time   CHOL 154 02/25/2018 1233   TRIG 65.0 02/25/2018 1233   HDL 67.40 02/25/2018 1233   CHOLHDL 2 02/25/2018 1233   VLDL 13.0 02/25/2018 1233   LDLCALC 74 02/25/2018 1233    CBC    Component Value Date/Time   WBC 5.4 03/09/2018 1650   RBC 3.52 (L) 03/09/2018 1650   HGB 12.0 08/18/2018 1344   HCT 27 (A) 06/23/2018 0000   PLT 221.0 03/09/2018 1650   MCV 88.4 03/09/2018 1650   MCH 28.4 02/27/2018 0533   MCHC 33.2 03/09/2018 1650   RDW 14.6 03/09/2018 1650   LYMPHSABS  2.3 02/26/2018 0930   MONOABS 0.3 02/26/2018 0930   EOSABS 0.2 02/26/2018 0930   BASOSABS 0.0 02/26/2018 0930    Hgb A1C No results found for: HGBA1C         Assessment & Plan:     Webb Silversmith, NP This visit occurred during the SARS-CoV-2 public health emergency.  Safety protocols were in place, including screening questions prior to the visit, additional usage of staff PPE, and extensive cleaning of exam room while observing appropriate contact time as indicated for disinfecting solutions.

## 2019-04-11 ENCOUNTER — Other Ambulatory Visit: Payer: Self-pay

## 2019-04-11 ENCOUNTER — Encounter: Payer: Self-pay | Admitting: Vascular Surgery

## 2019-04-11 ENCOUNTER — Encounter
Admission: RE | Disposition: A | Discharge status: Home / Self Care | Payer: Self-pay | Source: Home / Self Care | Attending: Vascular Surgery

## 2019-04-11 ENCOUNTER — Ambulatory Visit
Admission: RE | Admit: 2019-04-11 | Discharge: 2019-04-11 | Disposition: A | Discharge status: Home / Self Care | Payer: Medicare Other | Attending: Vascular Surgery | Admitting: Vascular Surgery

## 2019-04-11 DIAGNOSIS — E1122 Type 2 diabetes mellitus with diabetic chronic kidney disease: Secondary | ICD-10-CM | POA: Diagnosis not present

## 2019-04-11 DIAGNOSIS — Z992 Dependence on renal dialysis: Secondary | ICD-10-CM | POA: Insufficient documentation

## 2019-04-11 DIAGNOSIS — I2510 Atherosclerotic heart disease of native coronary artery without angina pectoris: Secondary | ICD-10-CM | POA: Insufficient documentation

## 2019-04-11 DIAGNOSIS — T82858A Stenosis of vascular prosthetic devices, implants and grafts, initial encounter: Secondary | ICD-10-CM | POA: Diagnosis not present

## 2019-04-11 DIAGNOSIS — T82898A Other specified complication of vascular prosthetic devices, implants and grafts, initial encounter: Secondary | ICD-10-CM

## 2019-04-11 DIAGNOSIS — I120 Hypertensive chronic kidney disease with stage 5 chronic kidney disease or end stage renal disease: Secondary | ICD-10-CM | POA: Insufficient documentation

## 2019-04-11 DIAGNOSIS — Y841 Kidney dialysis as the cause of abnormal reaction of the patient, or of later complication, without mention of misadventure at the time of the procedure: Secondary | ICD-10-CM | POA: Insufficient documentation

## 2019-04-11 DIAGNOSIS — Z87891 Personal history of nicotine dependence: Secondary | ICD-10-CM | POA: Diagnosis not present

## 2019-04-11 DIAGNOSIS — N186 End stage renal disease: Secondary | ICD-10-CM

## 2019-04-11 HISTORY — PX: A/V FISTULAGRAM: CATH118298

## 2019-04-11 LAB — POTASSIUM (ARMC VASCULAR LAB ONLY): Potassium (ARMC vascular lab): 3.5 (ref 3.5–5.1)

## 2019-04-11 SURGERY — A/V FISTULAGRAM
Anesthesia: Moderate Sedation | Laterality: Left

## 2019-04-11 MED ORDER — METHYLPREDNISOLONE SODIUM SUCC 125 MG IJ SOLR: 125.0000 mg | Freq: Once | INTRAMUSCULAR | Status: DC | PRN

## 2019-04-11 MED ORDER — HEPARIN SODIUM (PORCINE) 1000 UNIT/ML IJ SOLN
INTRAMUSCULAR | Status: AC
  Filled 2019-04-11: qty 1

## 2019-04-11 MED ORDER — CEFAZOLIN SODIUM-DEXTROSE 1-4 GM/50ML-% IV SOLN: 1.0000 g | Freq: Once | INTRAVENOUS | Status: AC

## 2019-04-11 MED ORDER — DIPHENHYDRAMINE HCL 50 MG/ML IJ SOLN: 50.0000 mg | Freq: Once | INTRAMUSCULAR | Status: DC | PRN

## 2019-04-11 MED ORDER — CEFAZOLIN SODIUM-DEXTROSE 1-4 GM/50ML-% IV SOLN
INTRAVENOUS | Status: AC
  Administered 2019-04-11: 1 g via INTRAVENOUS
  Filled 2019-04-11: qty 50

## 2019-04-11 MED ORDER — MIDAZOLAM HCL 2 MG/2ML IJ SOLN
INTRAMUSCULAR | Status: DC | PRN
  Administered 2019-04-11: 2 mg via INTRAVENOUS
  Administered 2019-04-11 (×2): 1 mg via INTRAVENOUS

## 2019-04-11 MED ORDER — MIDAZOLAM HCL 2 MG/ML PO SYRP: 8.0000 mg | ORAL_SOLUTION | Freq: Once | ORAL | Status: DC | PRN

## 2019-04-11 MED ORDER — ONDANSETRON HCL 4 MG/2ML IJ SOLN: 4.0000 mg | Freq: Four times a day (QID) | INTRAMUSCULAR | Status: DC | PRN

## 2019-04-11 MED ORDER — MIDAZOLAM HCL 5 MG/5ML IJ SOLN
INTRAMUSCULAR | Status: AC
  Filled 2019-04-11: qty 5

## 2019-04-11 MED ORDER — FAMOTIDINE 20 MG PO TABS: 40.0000 mg | ORAL_TABLET | Freq: Once | ORAL | Status: DC | PRN

## 2019-04-11 MED ORDER — FENTANYL CITRATE (PF) 100 MCG/2ML IJ SOLN
INTRAMUSCULAR | Status: AC
  Filled 2019-04-11: qty 2

## 2019-04-11 MED ORDER — FENTANYL CITRATE (PF) 100 MCG/2ML IJ SOLN
INTRAMUSCULAR | Status: DC | PRN
  Administered 2019-04-11 (×3): 50 ug via INTRAVENOUS

## 2019-04-11 MED ORDER — HEPARIN SODIUM (PORCINE) 1000 UNIT/ML IJ SOLN
INTRAMUSCULAR | Status: DC | PRN
  Administered 2019-04-11: 3000 [IU] via INTRAVENOUS

## 2019-04-11 MED ORDER — HYDROMORPHONE HCL 1 MG/ML IJ SOLN: 1.0000 mg | Freq: Once | INTRAMUSCULAR | Status: DC | PRN

## 2019-04-11 MED ORDER — SODIUM CHLORIDE 0.9 % IV SOLN: INTRAVENOUS | Status: DC

## 2019-04-11 SURGICAL SUPPLY — 21 items
BALLN DORADO 5X40X80 (BALLOONS) ×2
BALLN DORADO 8X40X80 (BALLOONS) ×2
BALLOON DORADO 5X40X80 (BALLOONS) ×1 IMPLANT
BALLOON DORADO 8X40X80 (BALLOONS) ×1 IMPLANT
CATH BEACON 5 .035 40 KMP TP (CATHETERS) ×1 IMPLANT
CATH BEACON 5 .038 40 KMP TP (CATHETERS) ×2
DEVICE PRESTO INFLATION (MISCELLANEOUS) ×2 IMPLANT
DRAPE BRACHIAL (DRAPES) ×2 IMPLANT
NEEDLE ENTRY 21GA 7CM ECHOTIP (NEEDLE) ×2 IMPLANT
PACK ANGIOGRAPHY (CUSTOM PROCEDURE TRAY) ×2 IMPLANT
SET INTRO CAPELLA COAXIAL (SET/KITS/TRAYS/PACK) ×2 IMPLANT
SHEATH BRITE TIP 6FRX5.5 (SHEATH) ×2 IMPLANT
SHEATH BRITE TIP 7FRX11 (SHEATH) ×2 IMPLANT
STENT VIABAHN 8X50X120 (Permanent Stent) ×2 IMPLANT
STENT VIABAHN5X120X8X (Permanent Stent) ×1 IMPLANT
SUT MNCRL 4-0 (SUTURE) ×2
SUT MNCRL 4-0 27XMFL (SUTURE) ×1
SUTURE MNCRL 4-0 27XMF (SUTURE) ×1 IMPLANT
TOWEL OR 17X26 4PK STRL BLUE (TOWEL DISPOSABLE) ×2 IMPLANT
WIRE G 018X200 V18 (WIRE) ×2 IMPLANT
WIRE MAGIC TOR.035 180C (WIRE) ×2 IMPLANT

## 2019-04-11 NOTE — Op Note (Signed)
OPERATIVE NOTE   PROCEDURE: 1. Contrast injection left brachiocephalic AV access 2. Percutaneous transluminal angioplasty and stent placement left mid cephalic vein  PRE-OPERATIVE DIAGNOSIS: Complication of dialysis access                                                       End Stage Renal Disease  POST-OPERATIVE DIAGNOSIS: same as above   SURGEON: Katha Cabal, M.D.  ANESTHESIA: Conscious sedation was administered under my direct supervision by the interventional radiology RN. IV Versed plus fentanyl were utilized. Continuous ECG, pulse oximetry and blood pressure was monitored throughout the entire procedure.  Conscious sedation was for a total of 45 minutes.  ESTIMATED BLOOD LOSS: minimal  FINDING(S): 1. String sign noted just below the level of the deltoid in the cephalic vein  SPECIMEN(S):  None  CONTRAST: 30 cc  FLUOROSCOPY TIME: 1.0 minutes  INDICATIONS: Kimberly Frost is a 69 y.o. female who  presents with malfunctioning left brachiocephalic AV access.  The patient is scheduled for angiography with possible intervention of the AV access.  The patient is aware the risks include but are not limited to: bleeding, infection, thrombosis of the cannulated access, and possible anaphylactic reaction to the contrast.  The patient acknowledges if the access can not be salvaged a tunneled catheter will be needed and will be placed during this procedure.  The patient is aware of the risks of the procedure and elects to proceed with the angiogram and intervention.  DESCRIPTION: After full informed written consent was obtained, the patient was brought back to the Special Procedure suite and placed supine position.  Appropriate cardiopulmonary monitors were placed.  The left arm was prepped and draped in the standard fashion.  Appropriate timeout is called. The left brachiocephalic fistula was cannulated with a micropuncture needle.  Cannulation was performed with ultrasound  guidance. Ultrasound was placed in a sterile sleeve, the AV access was interrogated and noted to be echolucent and compressible indicating patency. Image was recorded for the permanent record. The puncture is performed under continuous ultrasound visualization.   The microwire was advanced and the needle was exchanged for  a microsheath.  The J-wire was then advanced and a 6 Fr sheath inserted.  Hand injections were completed to image the access from the arterial anastomosis through the entire access.  The central venous structures were also imaged by hand injections.  Diagnostic interpretation: The area of the vein that is used for cannulation is widely patent and measures 8 to 10 mm.  Just proximal to this at the level of the deltoid there is a 3-1/2 cm segment of the cephalic vein that is a string sign greater than 90% stenotic.  More proximal the cephalic vein is widely patent and measures approximately 8 mm in diameter.  Cephalic subclavian confluence is widely patent.  Subclavian, innominate and superior vena cava are widely patent.  Arterial inflow is patent.  Based on the images,  3000 units of heparin was given and a wire was negotiated through the strictures within the venous portion of the graft.  The sheath is upsized to a 7 [place] sheath and a 5 mm x 40 mm Dor[place] balloon is advanced across the lesion inflated to 20 atm for 30 seconds.  Greater than 50% residual stenosis is still present and an 8 mm  x 50 mm Viabahn was deployed across the stenoses and postdilated with an 8 mm Dorado balloon inflated to 16 atm for 30 seconds.  Follow-up imaging then demonstrated excellent apposition of the stent with less than 5% residual stenosis.  Follow-up imaging demonstrates complete resolution of the stricture with rapid flow of contrast through the graft, the central venous anatomy is preserved.  A 4-0 Monocryl purse-string suture was sewn around the sheath.  The sheath was removed and light pressure was  applied.  A sterile bandage was applied to the puncture site.    COMPLICATIONS: None  CONDITION: Kimberly Frost, M.D [facility] Office: [phone]  04/11/2019 9:18 AM

## 2019-04-11 NOTE — Discharge Instructions (Signed)
Moderate Conscious Sedation, Adult, Care After These instructions provide you with information about caring for yourself after your procedure. Your health care provider may also give you more specific instructions. Your treatment has been planned according to current medical practices, but problems sometimes occur. Call your health care provider if you have any problems or questions after your procedure. What can I expect after the procedure? After your procedure, it is common:  To feel sleepy for several hours.  To feel clumsy and have poor balance for several hours.  To have poor judgment for several hours.  To vomit if you eat too soon. Follow these instructions at home: For at least 24 hours after the procedure:   Do not: ? Participate in activities where you could fall or become injured. ? Drive. ? Use heavy machinery. ? Drink alcohol. ? Take sleeping pills or medicines that cause drowsiness. ? Make important decisions or sign legal documents. ? Take care of children on your own.  Rest. Eating and drinking  Follow the diet recommended by your health care provider.  If you vomit: ? Drink water, juice, or soup when you can drink without vomiting. ? Make sure you have little or no nausea before eating solid foods. General instructions  Have a responsible adult stay with you until you are awake and alert.  Take over-the-counter and prescription medicines only as told by your health care provider.  If you smoke, do not smoke without supervision.  Keep all follow-up visits as told by your health care provider. This is important. Contact a health care provider if:  You keep feeling nauseous or you keep vomiting.  You feel light-headed.  You develop a rash.  You have a fever. Get help right away if:  You have trouble breathing. This information is not intended to replace advice given to you by your health care provider. Make sure you discuss any questions you have  with your health care provider. Document Revised: 01/01/2017 Document Reviewed: 05/11/2015 Elsevier Patient Education  [place]. Dialysis Fistulogram, Care After This sheet gives you information about how to care for yourself after your procedure. Your health care provider may also give you more specific instructions. If you have problems or questions, contact your health care provider. What can I expect after the procedure? After the procedure, it is common to have:  A small amount of discomfort in the area where the small, thin tube (catheter) was placed for the procedure.  A small amount of bruising around the fistula.  Sleepiness and tiredness (fatigue). Follow these instructions at home: Activity   Rest at home and do not lift anything that is heavier than 5 lb (2.3 kg) on the day after your procedure.  Return to your normal activities as told by your health care provider. Ask your health care provider what activities are safe for you.  Do not drive or use heavy machinery while taking prescription pain medicine.  Do not drive for 24 hours if you were given a medicine to help you relax (sedative) during your procedure. Medicines   Take over-the-counter and prescription medicines only as told by your health care provider. Puncture site care  Follow instructions from your health care provider about how to take care of the site where catheters were inserted. Make sure you: ? Wash your hands with soap and water before you change your bandage (dressing). If soap and water are not available, use hand sanitizer. ? Change your dressing as told by your health  care provider. ? Leave stitches (sutures), skin glue, or adhesive strips in place. These skin closures may need to stay in place for 2 weeks or longer. If adhesive strip edges start to loosen and curl up, you may trim the loose edges. Do not remove adhesive strips completely unless your health care provider tells you to do  that.  Check your puncture area every day for signs of infection. Check for: ? Redness, swelling, or pain. ? Fluid or blood. ? Warmth. ? Pus or a bad smell. General instructions  Do not take baths, swim, or use a hot tub until your health care provider approves. Ask your health care provider if you may take showers. You may only be allowed to take sponge baths.  Monitor your dialysis fistula closely. Check to make sure that you can feel a vibration or buzz (a thrill) when you put your fingers over the fistula.  Prevent damage to your graft or fistula: ? Do not wear tight-fitting clothing or jewelry on the arm or leg that has your graft or fistula. ? Tell all your health care providers that you have a dialysis fistula or graft. ? Do not allow blood draws, IVs, or blood pressure readings to be done in the arm that has your fistula or graft. ? Do not allow flu shots or vaccinations in the arm with your fistula or graft.  Keep all follow-up visits as told by your health care provider. This is important. Contact a health care provider if:  You have redness, swelling, or pain at the site where the catheter was put in.  You have fluid or blood coming from the catheter site.  The catheter site feels warm to the touch.  You have pus or a bad smell coming from the catheter site.  You have a fever or chills. Get help right away if:  You feel weak.  You have trouble balancing.  You have trouble moving your arms or legs.  You have problems with your speech or vision.  You can no longer feel a vibration or buzz when you put your fingers over your dialysis fistula.  The limb that was used for the procedure: ? Swells. ? Is painful. ? Is cold. ? Is discolored, such as blue or pale white.  You have chest pain or shortness of breath. Summary  After a dialysis fistulogram, it is common to have a small amount of discomfort or bruising in the area where the small, thin tube (catheter)  was placed.  Rest at home on the day after your procedure. Return to your normal activities as told by your health care provider.  Take over-the-counter and prescription medicines only as told by your health care provider.  Follow instructions from your health care provider about how to take care of the site where the catheter was inserted.  Keep all follow-up visits as told by your health care provider. This information is not intended to replace advice given to you by your health care provider. Make sure you discuss any questions you have with your health care provider. Document Revised: 02/19/2017 Document Reviewed: 02/19/2017 Elsevier Patient Education  2020 [employer].

## 2019-04-11 NOTE — H&P (Signed)
[name] Admission History & Physical  MRN : [id number]  Kimberly Frost is a 69 y.o. (10/23/1950) female who presents with chief complaint of No chief complaint on file. Marland Kitchen  History of Present Illness: I am asked to evaluate the patient by the dialysis center. The patient was sent here because they were unable to achieve adequate dialysis this morning. Furthermore the Center states there is very poor thrill and bruit. The patient states there there have been increasing problems with the access, such as "pulling clots" during dialysis and prolonged bleeding after decannulation. The patient estimates these problems have been going on for several weeks. The patient is unaware of any other change.  Patient denies pain or tenderness overlying the access.  There is no pain with dialysis.  The patient denies hand pain or finger pain consistent with steal syndrome.   There have not been any past interventions or declots of this access.  The patient is not chronically hypotensive on dialysis.  Current Facility-Administered Medications  Medication Dose Route Frequency Provider Last Rate Last Admin  . 0.9 %  sodium chloride infusion   Intravenous Continuous Eulogio Ditch E, NP      . ceFAZolin (ANCEF) 1-4 GM/50ML-% IVPB           . ceFAZolin (ANCEF) IVPB 1 g/50 mL premix  1 g Intravenous Once Eulogio Ditch E, NP      . diphenhydrAMINE (BENADRYL) injection 50 mg  50 mg Intravenous Once PRN Kris Hartmann, NP      . famotidine (PEPCID) tablet 40 mg  40 mg Oral Once PRN Kris Hartmann, NP      . fentaNYL (SUBLIMAZE) 100 MCG/2ML injection           . heparin 1000 UNIT/ML injection           . HYDROmorphone (DILAUDID) injection 1 mg  1 mg Intravenous Once PRN Eulogio Ditch E, NP      . methylPREDNISolone sodium succinate (SOLU-MEDROL) 125 mg/2 mL injection 125 mg  125 mg Intravenous Once PRN Eulogio Ditch E, NP      . midazolam (VERSED) 2 MG/ML syrup 8 mg  8 mg Oral Once PRN  Kris Hartmann, NP      . midazolam (VERSED) 5 MG/5ML injection           . ondansetron (ZOFRAN) injection 4 mg  4 mg Intravenous Q6H PRN Kris Hartmann, NP        Past Medical History:  Diagnosis Date  . Chronic kidney disease   . Edema    RLE  . Hepatitis    Hep C  . History of blood transfusion   . Hypertension   . Wears dentures     Past Surgical History:  Procedure Laterality Date  . AV FISTULA PLACEMENT Left 06/16/2018   Procedure: Left Arm ARTERIOVENOUS (AV) FISTULA CREATION;  Surgeon: Marty Heck, MD;  Location: [facility];  Service: Vascular;  Laterality: Left;  Marland Kitchen MULTIPLE TOOTH EXTRACTIONS    . MYOMECTOMY      Social History Social History   Tobacco Use  . Smoking status: Former Smoker    Types: Cigarettes  . Smokeless tobacco: Never Used  Substance Use Topics  . Alcohol use: Not Currently    Alcohol/week: 6.0 standard drinks    Types: 6 Cans of beer per week  . Drug use: Not Currently    Types: Marijuana    Comment: occasional  Family History Family History  Problem Relation Age of Onset  . Diabetes Mother   . Hypertension Mother   . Bone cancer Father   . Kidney cancer Maternal Grandmother   . Stroke Brother   . Diabetes Brother     No family history of bleeding or clotting disorders, autoimmune disease or porphyria  No Known Allergies   REVIEW OF SYSTEMS (Negative unless checked)  Constitutional: [] Weight loss  [] Fever  [] Chills Cardiac: [] Chest pain   [] Chest pressure   [] Palpitations   [] Shortness of breath when laying flat   [] Shortness of breath at rest   [x] Shortness of breath with exertion. Vascular:  [] Pain in legs with walking   [] Pain in legs at rest   [] Pain in legs when laying flat   [] Claudication   [] Pain in feet when walking  [] Pain in feet at rest  [] Pain in feet when laying flat   [] History of DVT   [] Phlebitis   [] Swelling in legs   [] Varicose veins   [] Non-healing ulcers Pulmonary:   [] Uses home oxygen   [] Productive  cough   [] Hemoptysis   [] Wheeze  [] COPD   [] Asthma Neurologic:  [] Dizziness  [] Blackouts   [] Seizures   [] History of stroke   [] History of TIA  [] Aphasia   [] Temporary blindness   [] Dysphagia   [] Weakness or numbness in arms   [] Weakness or numbness in legs Musculoskeletal:  [] Arthritis   [] Joint swelling   [] Joint pain   [] Low back pain Hematologic:  [] Easy bruising  [] Easy bleeding   [] Hypercoagulable state   [] Anemic  [] Hepatitis Gastrointestinal:  [] Blood in stool   [] Vomiting blood  [] Gastroesophageal reflux/heartburn   [] Difficulty swallowing. Genitourinary:  [x] Chronic kidney disease   [] Difficult urination  [] Frequent urination  [] Burning with urination   [] Blood in urine Skin:  [] Rashes   [] Ulcers   [] Wounds Psychological:  [] History of anxiety   []  History of major depression.  Physical Examination  Vitals:   04/11/19 0722  BP: (!) 208/95  Pulse: 76  Resp: 16  Temp: 98.2 F (36.8 C)  TempSrc: Oral  SpO2: 100%  Weight: 55.8 kg  Height: 5\' 6"  (1.676 m)   Body mass index is 19.85 kg/m. Gen: WD/WN, NAD Head: [place]/AT, No temporalis wasting. Prominent temp pulse not noted. Ear/Nose/Throat: Hearing grossly intact, nares w/o erythema or drainage, oropharynx w/o Erythema/Exudate,  Eyes: Conjunctiva clear, sclera non-icteric Neck: Trachea midline.  No JVD.  Pulmonary:  Good air movement, respirations not labored, no use of accessory muscles.  Cardiac: RRR, normal S1, S2. Vascular: left brachial cephalic fistula mildly pulsatile good thrill Vessel Right Left  Radial Palpable Palpable  Ulnar Not Palpable Not Palpable  Brachial Palpable Palpable  Carotid Palpable, without bruit Palpable, without bruit  Gastrointestinal: soft, non-tender/non-distended. No guarding/reflex.  Musculoskeletal: M/S 5/5 throughout.  Extremities without ischemic changes.  No deformity or atrophy.  Neurologic: Sensation grossly intact in extremities.  Symmetrical.  Speech is fluent. Motor exam as listed  above. Psychiatric: Judgment intact, Mood & affect appropriate for pt's clinical situation. Dermatologic: No rashes or ulcers noted.  No cellulitis or open wounds.    CBC Lab Results  Component Value Date   WBC 5.4 03/09/2018   HGB 12.0 08/18/2018   HCT 27 (A) 06/23/2018   MCV 88.4 03/09/2018   PLT 221.0 03/09/2018    BMET    Component Value Date/Time   NA 138 06/23/2018 0000   K 4.9 06/23/2018 0000   CL 108 03/09/2018 1650   CO2 19  03/09/2018 1650   GLUCOSE 95 06/16/2018 1103   BUN 61 06/23/2018 0000   CREATININE 7.13 06/23/2018 0000   CALCIUM 8.8 06/23/2018 0000   GFRNONAA 9 (L) 02/27/2018 0533   GFRAA 6 06/23/2018 0000   CrCl cannot be calculated (Patient's most recent lab result is older than the maximum 21 days allowed.).  COAG No results found for: INR, PROTIME  Radiology No results found.  Assessment/Plan 1.  Complication dialysis device with thrombosis AV access:  Patient's left arm dialysis access is malfunctioning. The patient will undergo angiography and correction of any problems using interventional techniques with the hope of restoring function to the access.  The risks and benefits were described to the patient.  All questions were answered.  The patient agrees to proceed with angiography and intervention. Potassium will be drawn to ensure that it is an appropriate level prior to performing intervention. 2.  End-stage renal disease requiring hemodialysis:  Patient will continue dialysis therapy without further interruption if a successful intervention is not achieved then a tunneled catheter will be placed. Dialysis has already been arranged. 3.  Hypertension:  Patient will continue medical management; nephrology is following no changes in oral medications. 4. Diabetes mellitus:  Glucose will be monitored and oral medications been held this morning once the patient has undergone the patient's procedure po intake will be reinitiated and again Accu-Cheks will be  used to assess the blood glucose level and treat as needed. The patient will be restarted on the patient's usual hypoglycemic regime 5.  Coronary artery disease:  EKG will be monitored. Nitrates will be used if needed. The patient's oral cardiac medications will be continued.    Hortencia Pilar, MD  04/11/2019 8:16 AM

## 2019-04-12 DIAGNOSIS — N186 End stage renal disease: Secondary | ICD-10-CM | POA: Diagnosis not present

## 2019-04-12 DIAGNOSIS — D508 Other iron deficiency anemias: Secondary | ICD-10-CM | POA: Diagnosis not present

## 2019-04-12 DIAGNOSIS — D631 Anemia in chronic kidney disease: Secondary | ICD-10-CM | POA: Diagnosis not present

## 2019-04-12 DIAGNOSIS — D689 Coagulation defect, unspecified: Secondary | ICD-10-CM | POA: Diagnosis not present

## 2019-04-12 DIAGNOSIS — Z992 Dependence on renal dialysis: Secondary | ICD-10-CM | POA: Diagnosis not present

## 2019-04-12 DIAGNOSIS — N2581 Secondary hyperparathyroidism of renal origin: Secondary | ICD-10-CM | POA: Diagnosis not present

## 2019-04-13 DIAGNOSIS — D631 Anemia in chronic kidney disease: Secondary | ICD-10-CM | POA: Diagnosis not present

## 2019-04-13 DIAGNOSIS — D508 Other iron deficiency anemias: Secondary | ICD-10-CM | POA: Diagnosis not present

## 2019-04-13 DIAGNOSIS — Z992 Dependence on renal dialysis: Secondary | ICD-10-CM | POA: Diagnosis not present

## 2019-04-13 DIAGNOSIS — N186 End stage renal disease: Secondary | ICD-10-CM | POA: Diagnosis not present

## 2019-04-13 DIAGNOSIS — D689 Coagulation defect, unspecified: Secondary | ICD-10-CM | POA: Diagnosis not present

## 2019-04-13 DIAGNOSIS — N2581 Secondary hyperparathyroidism of renal origin: Secondary | ICD-10-CM | POA: Diagnosis not present

## 2019-04-15 DIAGNOSIS — N186 End stage renal disease: Secondary | ICD-10-CM | POA: Diagnosis not present

## 2019-04-15 DIAGNOSIS — N2581 Secondary hyperparathyroidism of renal origin: Secondary | ICD-10-CM | POA: Diagnosis not present

## 2019-04-15 DIAGNOSIS — D631 Anemia in chronic kidney disease: Secondary | ICD-10-CM | POA: Diagnosis not present

## 2019-04-15 DIAGNOSIS — D689 Coagulation defect, unspecified: Secondary | ICD-10-CM | POA: Diagnosis not present

## 2019-04-15 DIAGNOSIS — D508 Other iron deficiency anemias: Secondary | ICD-10-CM | POA: Diagnosis not present

## 2019-04-15 DIAGNOSIS — Z992 Dependence on renal dialysis: Secondary | ICD-10-CM | POA: Diagnosis not present

## 2019-04-17 ENCOUNTER — Ambulatory Visit (INDEPENDENT_AMBULATORY_CARE_PROVIDER_SITE_OTHER): Payer: Medicare Other | Admitting: Internal Medicine

## 2019-04-17 ENCOUNTER — Other Ambulatory Visit: Payer: Self-pay

## 2019-04-17 ENCOUNTER — Encounter: Payer: Self-pay | Admitting: Internal Medicine

## 2019-04-17 VITALS — BP 156/84 | HR 85 | Temp 97.6°F | Wt 127.0 lb

## 2019-04-17 DIAGNOSIS — Z01818 Encounter for other preprocedural examination: Secondary | ICD-10-CM

## 2019-04-17 DIAGNOSIS — N186 End stage renal disease: Secondary | ICD-10-CM | POA: Diagnosis not present

## 2019-04-17 NOTE — Patient Instructions (Signed)
Chronic Kidney Disease, Adult Chronic kidney disease (CKD) happens when the kidneys are damaged over a long period of time. The kidneys are two organs that help with:  Getting rid of waste and extra fluid from the blood.  Making hormones that maintain the amount of fluid in your tissues and blood vessels.  Making sure that the body has the right amount of fluids and chemicals. Most of the time, CKD does not go away, but it can usually be controlled. Steps must be taken to slow down the kidney damage or to stop it from getting worse. If this is not done, the kidneys may stop working. Follow these instructions at home: Medicines  Take over-the-counter and prescription medicines only as told by your doctor. You may need to change the amount of medicines you take.  Do not take any new medicines unless your doctor says it is okay. Many medicines can make your kidney damage worse.  Do not take any vitamin and supplements unless your doctor says it is okay. Many vitamins and supplements can make your kidney damage worse. General instructions  Follow a diet as told by your doctor. You may need to stay away from: ? Alcohol. ? Salty foods. ? Foods that are high in:  Potassium.  Calcium.  Protein.  Do not use any products that contain nicotine or tobacco, such as cigarettes and e-cigarettes. If you need help quitting, ask your doctor.  Keep track of your blood pressure at home. Tell your doctor about any changes.  If you have diabetes, keep track of your blood sugar as told by your doctor.  Try to stay at a healthy weight. If you need help, ask your doctor.  Exercise at least 30 minutes a day, 5 days a week.  Stay up-to-date with your shots (immunizations) as told by your doctor.  Keep all follow-up visits as told by your doctor. This is important. Contact a doctor if:  Your symptoms get worse.  You have new symptoms. Get help right away if:  You have symptoms of end-stage  kidney disease. These may include: ? Headaches. ? Numbness in your hands or feet. ? Easy bruising. ? Having hiccups often. ? Chest pain. ? Shortness of breath. ? Stopping of menstrual periods in women.  You have a fever.  You have very little pee (urine).  You have pain or bleeding when you pee. Summary  Chronic kidney disease (CKD) happens when the kidneys are damaged over a long period of time.  Most of the time, this condition does not go away, but it can usually be controlled. Steps must be taken to slow down the kidney damage or to stop it from getting worse.  Treatment may include a combination of medicines and lifestyle changes. This information is not intended to replace advice given to you by your health care provider. Make sure you discuss any questions you have with your health care provider. Document Revised: 01/01/2017 Document Reviewed: 02/24/2016 Elsevier Patient Education  2020 [employer].

## 2019-04-17 NOTE — Progress Notes (Signed)
Subjective:    Patient ID: Kimberly Frost, female    DOB: [birth year], 70 y.o.   MRN: [id number]  HPI  Pt presents to the clinic today requesting a referral to cardiology for an echo and stress test. She needs cardiac evaluation in preparation for her kidney transplant. She reports labile blood pressure but denies dizziness, vision changes, chest pain, chest tightness, SOB or peripheral edema.   Review of Systems  Past Medical History:  Diagnosis Date  . Chronic kidney disease   . Edema    RLE  . Hepatitis    Hep C  . History of blood transfusion   . Hypertension   . Wears dentures     Current Outpatient Medications  Medication Sig Dispense Refill  . acetaminophen (TYLENOL) 500 MG tablet Take 1,000 mg by mouth every 6 (six) hours as needed for moderate pain or headache.    . metroNIDAZOLE (METROGEL VAGINAL) 0.75 % vaginal gel Place 1 Applicatorful vaginally 2 (two) times daily. (Patient not taking: Reported on 04/03/2019) 70 g 0  . midodrine (PROAMATINE) 10 MG tablet Take 10 mg by mouth in the morning and at bedtime.    . multivitamin (RENA-VIT) TABS tablet Take 1 tablet by mouth daily.    . VELPHORO 500 MG chewable tablet Chew 1,000 mg by mouth See admin instructions. Take 1000 mg with meals and 500 mg with snacks     No current facility-administered medications for this visit.    No Known Allergies  Family History  Problem Relation Age of Onset  . Diabetes Mother   . Hypertension Mother   . Bone cancer Father   . Kidney cancer Maternal Grandmother   . Stroke Brother   . Diabetes Brother     Social History   Socioeconomic History  . Marital status: Single    Spouse name: Not on file  . Number of children: Not on file  . Years of education: Not on file  . Highest education level: Not on file  Occupational History  . Not on file  Tobacco Use  . Smoking status: Former Smoker    Types: Cigarettes  . Smokeless tobacco: Never Used  Substance and Sexual Activity    . Alcohol use: Not Currently    Alcohol/week: 6.0 standard drinks    Types: 6 Cans of beer per week  . Drug use: Not Currently    Types: Marijuana    Comment: occasional   . Sexual activity: Not Currently  Other Topics Concern  . Not on file  Social History Narrative   Lives at home in private residence with fiance' [place] Strain:   . Difficulty of Paying Living Expenses:   Food Insecurity:   . Worried About Charity fundraiser in the Last Year:   . Arboriculturist in the Last Year:   Transportation Needs:   . Film/video editor (Medical):   Marland Kitchen Lack of Transportation (Non-Medical):   Physical Activity:   . Days of Exercise per Week:   . Minutes of Exercise per Session:   Stress:   . Feeling of Stress :   Social Connections:   . Frequency of Communication with Friends and Family:   . Frequency of Social Gatherings with Friends and Family:   . Attends Religious Services:   . Active Member of Clubs or Organizations:   . Attends Archivist Meetings:   Marland Kitchen Marital Status:  Intimate Partner Violence:   . Fear of Current or Ex-Partner:   . Emotionally Abused:   Marland Kitchen Physically Abused:   . Sexually Abused:      Constitutional: Denies fever, malaise, fatigue, headache or abrupt weight changes.  Respiratory: Denies difficulty breathing, shortness of breath, cough or sputum production.   Cardiovascular: Denies chest pain, chest tightness, palpitations or swelling in the hands or feet.  Neurological: Denies dizziness, difficulty with memory, difficulty with speech or problems with balance and coordination.    No other specific complaints in a complete review of systems (except as listed in HPI above).     Objective:   Physical Exam  BP (!) 156/84   Pulse 85   Temp 97.6 F (36.4 C) (Temporal)   Wt 127 lb (57.6 kg)   SpO2 98%   BMI 20.50 kg/m   Wt Readings from Last 3 Encounters:  04/11/19 123 lb (55.8 kg)   01/06/19 118 lb (53.5 kg)  07/22/18 114 lb (51.7 kg)    General: Appears her stated age, well developed, well nourished in NAD. Cardiovascular: Normal rate and rhythm. S1,S2 noted.  No murmur, rubs or gallops noted. No JVD or BLE edema. Fistual left arm +thrill, +bruit. Pulmonary/Chest: Normal effort and positive vesicular breath sounds. No respiratory distress. No wheezes, rales or ronchi noted.  Neurological: Alert and oriented.   BMET    Component Value Date/Time   NA 138 06/23/2018 0000   K 4.9 06/23/2018 0000   CL 108 03/09/2018 1650   CO2 19 03/09/2018 1650   GLUCOSE 95 06/16/2018 1103   BUN 61 06/23/2018 0000   CREATININE 7.13 06/23/2018 0000   CALCIUM 8.8 06/23/2018 0000   GFRNONAA 9 (L) 02/27/2018 0533   GFRAA 6 06/23/2018 0000    Lipid Panel     Component Value Date/Time   CHOL 154 02/25/2018 1233   TRIG 65.0 02/25/2018 1233   HDL 67.40 02/25/2018 1233   CHOLHDL 2 02/25/2018 1233   VLDL 13.0 02/25/2018 1233   LDLCALC 74 02/25/2018 1233    CBC    Component Value Date/Time   WBC 5.4 03/09/2018 1650   RBC 3.52 (L) 03/09/2018 1650   HGB 12.0 08/18/2018 1344   HCT 27 (A) 06/23/2018 0000   PLT 221.0 03/09/2018 1650   MCV 88.4 03/09/2018 1650   MCH 28.4 02/27/2018 0533   MCHC 33.2 03/09/2018 1650   RDW 14.6 03/09/2018 1650   LYMPHSABS 2.3 02/26/2018 0930   MONOABS 0.3 02/26/2018 0930   EOSABS 0.2 02/26/2018 0930   BASOSABS 0.0 02/26/2018 0930    Hgb A1C No results found for: HGBA1C         Assessment & Plan:  Pre Transplant Evaluation, ESRD:  Referral to cardiology for pre transplant workup  Return precautions discussed  Webb Silversmith, NP This visit occurred during the SARS-CoV-2 public health emergency.  Safety protocols were in place, including screening questions prior to the visit, additional usage of staff PPE, and extensive cleaning of exam room while observing appropriate contact time as indicated for disinfecting solutions.

## 2019-04-18 DIAGNOSIS — N2581 Secondary hyperparathyroidism of renal origin: Secondary | ICD-10-CM | POA: Diagnosis not present

## 2019-04-18 DIAGNOSIS — D508 Other iron deficiency anemias: Secondary | ICD-10-CM | POA: Diagnosis not present

## 2019-04-18 DIAGNOSIS — N186 End stage renal disease: Secondary | ICD-10-CM | POA: Diagnosis not present

## 2019-04-18 DIAGNOSIS — D631 Anemia in chronic kidney disease: Secondary | ICD-10-CM | POA: Diagnosis not present

## 2019-04-18 DIAGNOSIS — Z992 Dependence on renal dialysis: Secondary | ICD-10-CM | POA: Diagnosis not present

## 2019-04-18 DIAGNOSIS — D689 Coagulation defect, unspecified: Secondary | ICD-10-CM | POA: Diagnosis not present

## 2019-04-20 DIAGNOSIS — D508 Other iron deficiency anemias: Secondary | ICD-10-CM | POA: Diagnosis not present

## 2019-04-20 DIAGNOSIS — D689 Coagulation defect, unspecified: Secondary | ICD-10-CM | POA: Diagnosis not present

## 2019-04-20 DIAGNOSIS — N186 End stage renal disease: Secondary | ICD-10-CM | POA: Diagnosis not present

## 2019-04-20 DIAGNOSIS — Z992 Dependence on renal dialysis: Secondary | ICD-10-CM | POA: Diagnosis not present

## 2019-04-20 DIAGNOSIS — D631 Anemia in chronic kidney disease: Secondary | ICD-10-CM | POA: Diagnosis not present

## 2019-04-20 DIAGNOSIS — N2581 Secondary hyperparathyroidism of renal origin: Secondary | ICD-10-CM | POA: Diagnosis not present

## 2019-04-22 DIAGNOSIS — D689 Coagulation defect, unspecified: Secondary | ICD-10-CM | POA: Diagnosis not present

## 2019-04-22 DIAGNOSIS — D508 Other iron deficiency anemias: Secondary | ICD-10-CM | POA: Diagnosis not present

## 2019-04-22 DIAGNOSIS — N186 End stage renal disease: Secondary | ICD-10-CM | POA: Diagnosis not present

## 2019-04-22 DIAGNOSIS — Z992 Dependence on renal dialysis: Secondary | ICD-10-CM | POA: Diagnosis not present

## 2019-04-22 DIAGNOSIS — N2581 Secondary hyperparathyroidism of renal origin: Secondary | ICD-10-CM | POA: Diagnosis not present

## 2019-04-22 DIAGNOSIS — D631 Anemia in chronic kidney disease: Secondary | ICD-10-CM | POA: Diagnosis not present

## 2019-04-25 DIAGNOSIS — Z992 Dependence on renal dialysis: Secondary | ICD-10-CM | POA: Diagnosis not present

## 2019-04-25 DIAGNOSIS — D689 Coagulation defect, unspecified: Secondary | ICD-10-CM | POA: Diagnosis not present

## 2019-04-25 DIAGNOSIS — N2581 Secondary hyperparathyroidism of renal origin: Secondary | ICD-10-CM | POA: Diagnosis not present

## 2019-04-25 DIAGNOSIS — D508 Other iron deficiency anemias: Secondary | ICD-10-CM | POA: Diagnosis not present

## 2019-04-25 DIAGNOSIS — D631 Anemia in chronic kidney disease: Secondary | ICD-10-CM | POA: Diagnosis not present

## 2019-04-25 DIAGNOSIS — N186 End stage renal disease: Secondary | ICD-10-CM | POA: Diagnosis not present

## 2019-04-26 DIAGNOSIS — I10 Essential (primary) hypertension: Secondary | ICD-10-CM | POA: Insufficient documentation

## 2019-04-26 DIAGNOSIS — Z01818 Encounter for other preprocedural examination: Secondary | ICD-10-CM | POA: Insufficient documentation

## 2019-04-26 DIAGNOSIS — Z8619 Personal history of other infectious and parasitic diseases: Secondary | ICD-10-CM | POA: Insufficient documentation

## 2019-04-26 DIAGNOSIS — I770 Arteriovenous fistula, acquired: Secondary | ICD-10-CM | POA: Insufficient documentation

## 2019-04-26 DIAGNOSIS — Z9289 Personal history of other medical treatment: Secondary | ICD-10-CM | POA: Insufficient documentation

## 2019-04-27 ENCOUNTER — Other Ambulatory Visit (INDEPENDENT_AMBULATORY_CARE_PROVIDER_SITE_OTHER): Payer: Self-pay | Admitting: Vascular Surgery

## 2019-04-27 DIAGNOSIS — N2581 Secondary hyperparathyroidism of renal origin: Secondary | ICD-10-CM | POA: Diagnosis not present

## 2019-04-27 DIAGNOSIS — D508 Other iron deficiency anemias: Secondary | ICD-10-CM | POA: Diagnosis not present

## 2019-04-27 DIAGNOSIS — D631 Anemia in chronic kidney disease: Secondary | ICD-10-CM | POA: Diagnosis not present

## 2019-04-27 DIAGNOSIS — N186 End stage renal disease: Secondary | ICD-10-CM | POA: Diagnosis not present

## 2019-04-27 DIAGNOSIS — Z992 Dependence on renal dialysis: Secondary | ICD-10-CM | POA: Diagnosis not present

## 2019-04-27 DIAGNOSIS — D689 Coagulation defect, unspecified: Secondary | ICD-10-CM | POA: Diagnosis not present

## 2019-04-27 DIAGNOSIS — Z95820 Peripheral vascular angioplasty status with implants and grafts: Secondary | ICD-10-CM

## 2019-04-27 DIAGNOSIS — T829XXS Unspecified complication of cardiac and vascular prosthetic device, implant and graft, sequela: Secondary | ICD-10-CM

## 2019-04-28 ENCOUNTER — Other Ambulatory Visit
Admission: RE | Admit: 2019-04-28 | Discharge: 2019-04-28 | Disposition: A | Discharge status: Home / Self Care | Payer: Medicare Other | Source: Ambulatory Visit | Attending: Vascular Surgery | Admitting: Vascular Surgery

## 2019-04-28 ENCOUNTER — Encounter: Payer: Self-pay | Admitting: Internal Medicine

## 2019-04-28 ENCOUNTER — Ambulatory Visit (INDEPENDENT_AMBULATORY_CARE_PROVIDER_SITE_OTHER): Payer: Medicare Other | Admitting: Nurse Practitioner

## 2019-04-28 ENCOUNTER — Encounter (INDEPENDENT_AMBULATORY_CARE_PROVIDER_SITE_OTHER): Payer: Self-pay | Admitting: Nurse Practitioner

## 2019-04-28 ENCOUNTER — Ambulatory Visit (INDEPENDENT_AMBULATORY_CARE_PROVIDER_SITE_OTHER): Payer: Medicare Other | Admitting: Cardiovascular Disease

## 2019-04-28 ENCOUNTER — Other Ambulatory Visit: Payer: Self-pay

## 2019-04-28 ENCOUNTER — Ambulatory Visit (INDEPENDENT_AMBULATORY_CARE_PROVIDER_SITE_OTHER): Payer: Medicare Other

## 2019-04-28 VITALS — BP 140/82 | HR 78 | Ht 66.0 in | Wt 125.4 lb

## 2019-04-28 VITALS — BP 173/92 | HR 87 | Resp 16 | Ht 66.0 in | Wt 124.0 lb

## 2019-04-28 DIAGNOSIS — R9431 Abnormal electrocardiogram [ECG] [EKG]: Secondary | ICD-10-CM | POA: Diagnosis not present

## 2019-04-28 DIAGNOSIS — R531 Weakness: Secondary | ICD-10-CM

## 2019-04-28 DIAGNOSIS — N186 End stage renal disease: Secondary | ICD-10-CM

## 2019-04-28 DIAGNOSIS — Z7682 Awaiting organ transplant status: Secondary | ICD-10-CM | POA: Diagnosis not present

## 2019-04-28 DIAGNOSIS — Z87891 Personal history of nicotine dependence: Secondary | ICD-10-CM

## 2019-04-28 DIAGNOSIS — I10 Essential (primary) hypertension: Secondary | ICD-10-CM | POA: Diagnosis not present

## 2019-04-28 DIAGNOSIS — Z95820 Peripheral vascular angioplasty status with implants and grafts: Secondary | ICD-10-CM | POA: Diagnosis not present

## 2019-04-28 DIAGNOSIS — Z01812 Encounter for preprocedural laboratory examination: Secondary | ICD-10-CM | POA: Insufficient documentation

## 2019-04-28 DIAGNOSIS — T829XXS Unspecified complication of cardiac and vascular prosthetic device, implant and graft, sequela: Secondary | ICD-10-CM | POA: Diagnosis not present

## 2019-04-28 DIAGNOSIS — Z20822 Contact with and (suspected) exposure to covid-19: Secondary | ICD-10-CM | POA: Diagnosis not present

## 2019-04-28 NOTE — Patient Instructions (Signed)
We will request the cardioscan from [facility]    Medication Instructions:  No changes  If you need a refill on your cardiac medications before your next appointment, please call your pharmacy.    Lab work: No new labs needed   If you have labs (blood work) drawn today and your tests are completely normal, you will receive your results only by: Marland Kitchen MyChart Message (if you have MyChart) OR . A paper copy in the mail If you have any lab test that is abnormal or we need to change your treatment, we will call you to review the results.   Testing/Procedures: No new testing needed   Follow-Up: At [facility], you and your health needs are our priority.  As part of our continuing mission to provide you with exceptional heart care, we have created designated Provider Care Teams.  These Care Teams include your primary Cardiologist (physician) and Advanced Practice Providers (APPs -  Physician Assistants and Nurse Practitioners) who all work together to provide you with the care you need, when you need it.  . You will need a follow up appointment as needed .  Marland Kitchen Providers on your designated Care Team:   . Murray Hodgkins, NP . Christell Faith, PA-C . Marrianne Mood, PA-C  Any Other Special Instructions Will Be Listed Below (If Applicable).  For educational health videos Log in to : [email or web address] Or : [email or web address], password : [email or web address]

## 2019-04-28 NOTE — Progress Notes (Signed)
New Outpatient Visit Date: 04/28/2019  Referring Provider: [name], NP [address] [place],  [place] [address]  Chief Complaint: pretransplant cardiac evaluation  HPI:  Kimberly Frost is a 69 y.o. female who is being seen today for the evaluation of cardiac risk at the request of Ms. Baity in anticipation of kidney transplant work-up. She has a history of end-stage renal disease, hypertension, and hepatitis C ("treated").  She is currently undergoing transplant work-up at [facility].  Other past medical history includes history of positive rheumatoid factor and positive RNA history of alcohol abuse quit 01/2018 Admission 02/26/2018 - 02/27/2018: for elevated creatinine and hemoglobin 6.9.  Received 2 u pRBC  HD since aug 2020 History of hypertension, diagnosed  around 2018, now off all anti-hypertensive medications.   takes Midodrine 10 mg BID everyday. Former smoker, stopped 30 yrs ago  Long discussion concerning her activity tolerance Reports feeling tired after dialysis, but in general is doing well with no symptoms concerning for unstable angina  More recently has been feeling weak, problem with the AV graft, run time at dialysis only 1 hour Scheduled to see vein and vascular this afternoon No regular exercise program Denies prior history of cardiac issues, no prior MI, catheterization or stenting  Prior imaging studies reviewed CT ABD pelvis 1.  Mild burden external iliac artery atherosclerotic calcifications.   2.  Bilateral atrophic kidneys. Left kidney measures 8.3 cm. Right kidney measures 7.0 cm.  3.  A 5 mm pulmonary nodule in the right lower lobe is nonspecific.  A " cardiology scan" was performed at [facility] April 27, 2019, results have been requested and are unavailable through care everywhere   Lab work reviewed Total chol 154, LDL 74   Cardiovascular History & Procedures: Cardiovascular Problems:  None  Risk Factors:  Hypertension and age  greater than 2  Cath/PCI:  None  CV Surgery:  None  EP Procedures and Devices:  None  Non-Invasive Evaluation(s):  None  Recent CV Pertinent Labs: Lab Results  Component Value Date   CHOL 154 02/25/2018   HDL 67.40 02/25/2018   LDLCALC 74 02/25/2018   TRIG 65.0 02/25/2018   CHOLHDL 2 02/25/2018   K 4.9 06/23/2018   MG 1.7 01/26/2013   BUN 61 06/23/2018   CREATININE 7.13 06/23/2018     Past Medical History:  Diagnosis Date  . Chronic kidney disease   . Edema    RLE  . Hepatitis    Hep C  . History of blood transfusion   . Hypertension   . Wears dentures     Past Surgical History:  Procedure Laterality Date  . A/V FISTULAGRAM Left 04/11/2019   Procedure: A/V FISTULAGRAM;  Surgeon: Katha Cabal, MD;  Location: [facility];  Service: Cardiovascular;  Laterality: Left;  . AV FISTULA PLACEMENT Left 06/16/2018   Procedure: Left Arm ARTERIOVENOUS (AV) FISTULA CREATION;  Surgeon: Marty Heck, MD;  Location: [facility];  Service: Vascular;  Laterality: Left;  Marland Kitchen MULTIPLE TOOTH EXTRACTIONS    . MYOMECTOMY      Current Outpatient Medications on File Prior to Visit  Medication Sig Dispense Refill  . acetaminophen (TYLENOL) 500 MG tablet Take 1,000 mg by mouth every 6 (six) hours as needed for moderate pain or headache.    . midodrine (PROAMATINE) 10 MG tablet Take 10 mg by mouth in the morning and at bedtime.    . multivitamin (RENA-VIT) TABS tablet Take 1 tablet by mouth daily.    Marland Kitchen  VELPHORO 500 MG chewable tablet Chew 1,000 mg by mouth See admin instructions. Take 1000 mg with meals and 500 mg with snacks     No current facility-administered medications on file prior to visit.    Allergies: Patient has no known allergies.  Social History   Tobacco Use  . Smoking status: Former Smoker    Types: Cigarettes  . Smokeless tobacco: Never Used  Substance Use Topics  . Alcohol use: Not Currently    Alcohol/week: 6.0 standard drinks    Types: 6  Cans of beer per week  . Drug use: Not Currently    Types: Marijuana    Comment: occasional     Family History  Problem Relation Age of Onset  . Diabetes Mother   . Hypertension Mother   . Bone cancer Father   . Kidney cancer Maternal Grandmother   . Stroke Brother   . Diabetes Brother     Review of Systems: Review of Systems  Constitutional: Positive for malaise/fatigue.  Respiratory: Negative.   Cardiovascular: Negative.   Gastrointestinal: Negative.   Musculoskeletal: Negative.   Neurological: Negative.   Psychiatric/Behavioral: Negative.   All other systems reviewed and are negative.    Physical Exam: BP 140/82 (BP Location: Right Arm, Patient Position: Sitting, Cuff Size: Normal)   Pulse 78   Ht 5\' 6"  (1.676 m)   Wt 125 lb 6 oz (56.9 kg)   SpO2 97%   BMI 20.24 kg/m  Constitutional:  oriented to person, place, and time. No distress.  HENT:  Head: Grossly normal Eyes:  no discharge. No scleral icterus.  Neck: No JVD, no carotid bruits  Cardiovascular: Regular rate and rhythm, no murmurs appreciated AV graft on the left upper extremity Pulmonary/Chest: Clear to auscultation bilaterally, no wheezes or rails Abdominal: Soft.  no distension.  no tenderness.  Musculoskeletal: Normal range of motion Neurological:  normal muscle tone. Coordination normal. No atrophy Skin: Skin warm and dry Psychiatric: normal affect, pleasant   EKG: Normal sinus rhythm unable to exclude old anterior MI  Lab Results  Component Value Date   WBC 5.4 03/09/2018   HGB 12.0 08/18/2018   HCT 27 (A) 06/23/2018   MCV 88.4 03/09/2018   PLT 221.0 03/09/2018    Lab Results  Component Value Date   NA 138 06/23/2018   K 4.9 06/23/2018   CL 108 03/09/2018   CO2 19 03/09/2018   BUN 61 06/23/2018   CREATININE 7.13 06/23/2018   GLUCOSE 95 06/16/2018   ALT 5 03/09/2018    Lab Results  Component Value Date   CHOL 154 02/25/2018   HDL 67.40 02/25/2018   LDLCALC 74 02/25/2018    TRIG 65.0 02/25/2018   CHOLHDL 2 02/25/2018       ASSESSMENT AND PLAN:  preop cardivascular evaluation/pretransplant kidney No prior cardiac history No symptoms concerning for angina Fluid status managed by hemodialysis 3 days a week Recent work-up at [facility] including what appears to be a stress test, not reported yet, was performed yesterday -We have requested results of the stress test when this becomes available.  If no significant ischemia on stress testing, no further ischemic work-up needed. -A baseline echocardiogram could be performed. Will defer to the transplant team whether they would like that to be done at [facility] or through our system/[facility].  End-stage renal disease on hemodialysis Presumed secondary to poorly controlled hypertension On dialysis since August 2020 Reports problems with her AV fistula, scheduled to see vein and vascular today.  One-time only around 1 hour, having symptoms of weakness and fatigue since that time  Weakness Not participating in a exercise program, sedentary at baseline Symptoms exacerbated by recent issues with hemodialysis, problems with AV graft, short run time Reports that symptoms were much improved when she had full dialysis cycle   Transplant notes from [facility] have been reviewed, additional records requested including stress testing  Total encounter time more than 60 minutes  Greater than 50% was spent in counseling and coordination of care with the patient   Signed, Esmond Plants, MD, Ph.D [facility]

## 2019-04-29 DIAGNOSIS — N2581 Secondary hyperparathyroidism of renal origin: Secondary | ICD-10-CM | POA: Diagnosis not present

## 2019-04-29 DIAGNOSIS — N186 End stage renal disease: Secondary | ICD-10-CM | POA: Diagnosis not present

## 2019-04-29 DIAGNOSIS — Z992 Dependence on renal dialysis: Secondary | ICD-10-CM | POA: Diagnosis not present

## 2019-04-29 DIAGNOSIS — D508 Other iron deficiency anemias: Secondary | ICD-10-CM | POA: Diagnosis not present

## 2019-04-29 DIAGNOSIS — D631 Anemia in chronic kidney disease: Secondary | ICD-10-CM | POA: Diagnosis not present

## 2019-04-29 DIAGNOSIS — D689 Coagulation defect, unspecified: Secondary | ICD-10-CM | POA: Diagnosis not present

## 2019-04-29 LAB — SARS CORONAVIRUS 2 (TAT 6-24 HRS): SARS Coronavirus 2: NEGATIVE

## 2019-04-30 ENCOUNTER — Encounter (INDEPENDENT_AMBULATORY_CARE_PROVIDER_SITE_OTHER): Payer: Self-pay | Admitting: Nurse Practitioner

## 2019-04-30 ENCOUNTER — Other Ambulatory Visit (INDEPENDENT_AMBULATORY_CARE_PROVIDER_SITE_OTHER): Payer: Self-pay | Admitting: Nurse Practitioner

## 2019-04-30 NOTE — Progress Notes (Signed)
Subjective:    Patient ID: Kimberly Frost, female    DOB: 1950/07/16, 69 y.o.   MRN: [id number] Chief Complaint  Patient presents with  . Follow-up    Alarming during treatment    The patient returns to the office for follow up regarding problem with the dialysis access. Currently the patient is maintained via a left brachiocephalic AV fistula.  The patient reports having issues with dialyzing.  The patient states that for the last week or so she has only been able to dialyze for approximately an hour.  She states that this time she is starting to feel poorly.  She is having issues with some shortness of breath and just fatigue and feeling off.  The patient denies hand pain or other symptoms consistent with steal phenomena.  No significant arm swelling.  The patient denies redness or swelling at the access site. The patient denies fever or chills at home or while on dialysis.  The patient denies amaurosis fugax or recent TIA symptoms. There are no recent neurological changes noted. The patient denies claudication symptoms or rest pain symptoms. The patient denies history of DVT, PE or superficial thrombophlebitis. The patient denies recent episodes of angina or shortness of breath.   Today the patient underwent noninvasive studies which shows a patent left brachiocephalic AV fistula.  There is a flow volume of 356.  There is a critical stenosis in the distal upper arm region just past the anastomosis site.  There is moderate thrombus seen in the mid AV fistula and stent previously the patient had a flow volume of 2104 at the last HDA done on 07/19/2018.    Review of Systems  Constitutional: Positive for fatigue.  Respiratory: Positive for shortness of breath.   Neurological: Positive for weakness.  All other systems reviewed and are negative.      Objective:   Physical Exam Vitals reviewed.  Constitutional:      Appearance: Normal appearance.  HENT:     Head: Normocephalic.    Cardiovascular:     Rate and Rhythm: Normal rate and regular rhythm.     Pulses:          Radial pulses are 1+ on the right side and 1+ on the left side.     Arteriovenous access: left arteriovenous access is present.    Comments: Patient has good thrill and bruit at the proximal portion of fistula until he reached the mid to distal area where the thrill disappears and the bruit is nearly silent. Musculoskeletal:        General: Normal range of motion.  Skin:    General: Skin is warm and dry.  Neurological:     General: No focal deficit present.     Mental Status: She is alert and oriented to person, place, and time.  Psychiatric:        Mood and Affect: Mood normal.        Behavior: Behavior normal.        Thought Content: Thought content normal.        Judgment: Judgment normal.     BP (!) 173/92   Pulse 87   Resp 16   Ht 5\' 6"  (1.676 m)   Wt 124 lb (56.2 kg)   BMI 20.01 kg/m   Past Medical History:  Diagnosis Date  . Chronic kidney disease   . Edema    RLE  . Hepatitis    Hep C  . History of blood transfusion   .  Hypertension   . Wears dentures     Social History   Socioeconomic History  . Marital status: Single    Spouse name: Not on file  . Number of children: Not on file  . Years of education: Not on file  . Highest education level: Not on file  Occupational History  . Not on file  Tobacco Use  . Smoking status: Former Smoker    Types: Cigarettes  . Smokeless tobacco: Never Used  Substance and Sexual Activity  . Alcohol use: Not Currently    Alcohol/week: 6.0 standard drinks    Types: 6 Cans of beer per week  . Drug use: Not Currently    Types: Marijuana    Comment: occasional   . Sexual activity: Not Currently  Other Topics Concern  . Not on file  Social History Narrative   Lives at home in private residence with fiance' Ceylon Strain:   . Difficulty of Paying Living Expenses:   Food  Insecurity:   . Worried About Charity fundraiser in the Last Year:   . Arboriculturist in the Last Year:   Transportation Needs:   . Film/video editor (Medical):   Marland Kitchen Lack of Transportation (Non-Medical):   Physical Activity:   . Days of Exercise per Week:   . Minutes of Exercise per Session:   Stress:   . Feeling of Stress :   Social Connections:   . Frequency of Communication with Friends and Family:   . Frequency of Social Gatherings with Friends and Family:   . Attends Religious Services:   . Active Member of Clubs or Organizations:   . Attends Archivist Meetings:   Marland Kitchen Marital Status:   Intimate Partner Violence:   . Fear of Current or Ex-Partner:   . Emotionally Abused:   Marland Kitchen Physically Abused:   . Sexually Abused:     Past Surgical History:  Procedure Laterality Date  . A/V FISTULAGRAM Left 04/11/2019   Procedure: A/V FISTULAGRAM;  Surgeon: Katha Cabal, MD;  Location: [facility];  Service: Cardiovascular;  Laterality: Left;  . AV FISTULA PLACEMENT Left 06/16/2018   Procedure: Left Arm ARTERIOVENOUS (AV) FISTULA CREATION;  Surgeon: Marty Heck, MD;  Location: [facility];  Service: Vascular;  Laterality: Left;  Marland Kitchen MULTIPLE TOOTH EXTRACTIONS    . MYOMECTOMY      Family History  Problem Relation Age of Onset  . Diabetes Mother   . Hypertension Mother   . Bone cancer Father   . Kidney cancer Maternal Grandmother   . Stroke Brother   . Diabetes Brother     No Known Allergies     Assessment & Plan:   1. ESRD (end stage renal disease) ([place]) Recommend:  The patient is experiencing increasing problems with their dialysis access.  Patient should have a fistulagram with the intention for intervention.  The intention for intervention is to restore appropriate flow and prevent thrombosis and possible loss of the access.  As well as improve the quality of dialysis therapy.  Due to the fact that the patient has had low quality dialysis  sessions and is starting to become symptomatic we will try to address this urgently.  The risks, benefits and alternative therapies were reviewed in detail with the patient.  All questions were answered.  The patient agrees to proceed with angio/intervention.      2. Essential hypertension Continue  antihypertensive medications as already ordered, these medications have been reviewed and there are no changes at this time.    Current Outpatient Medications on File Prior to Visit  Medication Sig Dispense Refill  . acetaminophen (TYLENOL) 500 MG tablet Take 1,000 mg by mouth every 6 (six) hours as needed for moderate pain or headache.    . midodrine (PROAMATINE) 10 MG tablet Take 10 mg by mouth in the morning and at bedtime.    . multivitamin (RENA-VIT) TABS tablet Take 1 tablet by mouth daily.    . VELPHORO 500 MG chewable tablet Chew 1,000 mg by mouth See admin instructions. Take 1000 mg with meals and 500 mg with snacks     No current facility-administered medications on file prior to visit.    There are no Patient Instructions on file for this visit. No follow-ups on file.   Kris Hartmann, NP

## 2019-05-01 ENCOUNTER — Ambulatory Visit
Admission: RE | Admit: 2019-05-01 | Discharge: 2019-05-01 | Disposition: A | Discharge status: Home / Self Care | Payer: Medicare Other | Attending: Vascular Surgery | Admitting: Vascular Surgery

## 2019-05-01 ENCOUNTER — Ambulatory Visit: Payer: Medicare Other | Admitting: Internal Medicine

## 2019-05-01 ENCOUNTER — Encounter: Payer: Self-pay | Admitting: Vascular Surgery

## 2019-05-01 ENCOUNTER — Other Ambulatory Visit: Payer: Self-pay

## 2019-05-01 ENCOUNTER — Encounter
Admission: RE | Disposition: A | Discharge status: Home / Self Care | Payer: Self-pay | Source: Home / Self Care | Attending: Vascular Surgery

## 2019-05-01 DIAGNOSIS — N186 End stage renal disease: Secondary | ICD-10-CM | POA: Diagnosis not present

## 2019-05-01 DIAGNOSIS — Y841 Kidney dialysis as the cause of abnormal reaction of the patient, or of later complication, without mention of misadventure at the time of the procedure: Secondary | ICD-10-CM | POA: Insufficient documentation

## 2019-05-01 DIAGNOSIS — I120 Hypertensive chronic kidney disease with stage 5 chronic kidney disease or end stage renal disease: Secondary | ICD-10-CM | POA: Insufficient documentation

## 2019-05-01 DIAGNOSIS — Z87891 Personal history of nicotine dependence: Secondary | ICD-10-CM | POA: Insufficient documentation

## 2019-05-01 DIAGNOSIS — Z992 Dependence on renal dialysis: Secondary | ICD-10-CM | POA: Diagnosis not present

## 2019-05-01 DIAGNOSIS — T82898A Other specified complication of vascular prosthetic devices, implants and grafts, initial encounter: Secondary | ICD-10-CM | POA: Diagnosis not present

## 2019-05-01 DIAGNOSIS — T82858A Stenosis of vascular prosthetic devices, implants and grafts, initial encounter: Secondary | ICD-10-CM | POA: Diagnosis not present

## 2019-05-01 HISTORY — PX: A/V FISTULAGRAM: CATH118298

## 2019-05-01 LAB — POTASSIUM (ARMC VASCULAR LAB ONLY): Potassium (ARMC vascular lab): 4.6 (ref 3.5–5.1)

## 2019-05-01 SURGERY — A/V FISTULAGRAM
Anesthesia: Moderate Sedation | Site: Arm Upper | Laterality: Left

## 2019-05-01 MED ORDER — MIDAZOLAM HCL 2 MG/ML PO SYRP: 8.0000 mg | ORAL_SOLUTION | Freq: Once | ORAL | Status: DC | PRN

## 2019-05-01 MED ORDER — HEPARIN SODIUM (PORCINE) 1000 UNIT/ML IJ SOLN
INTRAMUSCULAR | Status: AC
  Filled 2019-05-01: qty 1

## 2019-05-01 MED ORDER — FENTANYL CITRATE (PF) 100 MCG/2ML IJ SOLN
INTRAMUSCULAR | Status: AC
  Filled 2019-05-01: qty 2

## 2019-05-01 MED ORDER — ONDANSETRON HCL 4 MG/2ML IJ SOLN: 4.0000 mg | Freq: Four times a day (QID) | INTRAMUSCULAR | Status: DC | PRN

## 2019-05-01 MED ORDER — SODIUM CHLORIDE 0.9 % IV SOLN: INTRAVENOUS | Status: DC

## 2019-05-01 MED ORDER — DIPHENHYDRAMINE HCL 50 MG/ML IJ SOLN: 50.0000 mg | Freq: Once | INTRAMUSCULAR | Status: DC | PRN

## 2019-05-01 MED ORDER — FENTANYL CITRATE (PF) 100 MCG/2ML IJ SOLN
INTRAMUSCULAR | Status: DC | PRN
  Administered 2019-05-01 (×3): 25 ug via INTRAVENOUS
  Administered 2019-05-01: 50 ug via INTRAVENOUS

## 2019-05-01 MED ORDER — MIDAZOLAM HCL 5 MG/5ML IJ SOLN
INTRAMUSCULAR | Status: AC
  Filled 2019-05-01: qty 5

## 2019-05-01 MED ORDER — IODIXANOL 320 MG/ML IV SOLN
INTRAVENOUS | Status: DC | PRN
  Administered 2019-05-01: 15:00:00 20 mL

## 2019-05-01 MED ORDER — HYDROMORPHONE HCL 1 MG/ML IJ SOLN: 1.0000 mg | Freq: Once | INTRAMUSCULAR | Status: DC | PRN

## 2019-05-01 MED ORDER — CEFAZOLIN SODIUM-DEXTROSE 1-4 GM/50ML-% IV SOLN
1.0000 g | Freq: Once | INTRAVENOUS | Status: AC
  Administered 2019-05-01: 1 g via INTRAVENOUS

## 2019-05-01 MED ORDER — MIDAZOLAM HCL 2 MG/2ML IJ SOLN
INTRAMUSCULAR | Status: DC | PRN
  Administered 2019-05-01 (×3): 0.5 mg via INTRAVENOUS
  Administered 2019-05-01: 2 mg via INTRAVENOUS

## 2019-05-01 MED ORDER — FAMOTIDINE 20 MG PO TABS: 40.0000 mg | ORAL_TABLET | Freq: Once | ORAL | Status: DC | PRN

## 2019-05-01 MED ORDER — HEPARIN SODIUM (PORCINE) 1000 UNIT/ML IJ SOLN
INTRAMUSCULAR | Status: DC | PRN
  Administered 2019-05-01: 3000 [IU] via INTRAVENOUS

## 2019-05-01 MED ORDER — METHYLPREDNISOLONE SODIUM SUCC 125 MG IJ SOLR: 125.0000 mg | Freq: Once | INTRAMUSCULAR | Status: DC | PRN

## 2019-05-01 SURGICAL SUPPLY — 12 items
BALLN LUTONIX DCB 6X60X130 (BALLOONS) ×2
BALLOON LUTONIX DCB 6X60X130 (BALLOONS) ×1 IMPLANT
CANNULA 5F STIFF (CANNULA) ×2 IMPLANT
CATH BEACON 5 .035 40 KMP TP (CATHETERS) ×1 IMPLANT
CATH BEACON 5 .038 40 KMP TP (CATHETERS) ×2
COVER PROBE U/S 5X48 (MISCELLANEOUS) ×2 IMPLANT
DEVICE PRESTO INFLATION (MISCELLANEOUS) ×2 IMPLANT
DRAPE BRACHIAL (DRAPES) ×2 IMPLANT
PACK ANGIOGRAPHY (CUSTOM PROCEDURE TRAY) ×2 IMPLANT
SHEATH BRITE TIP 6FRX5.5 (SHEATH) ×2 IMPLANT
SUT MNCRL AB 4-0 PS2 18 (SUTURE) ×2 IMPLANT
WIRE MAGIC TOR.035 180C (WIRE) ×2 IMPLANT

## 2019-05-01 NOTE — Op Note (Signed)
[facility]    OPERATIVE NOTE   PROCEDURE: 1.   Left brachiocephalic arteriovenous fistula cannulation under ultrasound guidance 2.   Left arm fistulagram including central venogram 3.   Percutaneous transluminal angioplasty of the perianastomotic cephalic vein with 6 mm diameter by 6 cm length Lutonix drug-coated angioplasty balloon  PRE-OPERATIVE DIAGNOSIS: 1. ESRD 2. Poorly functional left brachiocephalic AVF  POST-OPERATIVE DIAGNOSIS: same as above   SURGEON: Leotis Pain, MD  ANESTHESIA: local with MCS  ESTIMATED BLOOD LOSS: 3 cc  FINDING(S): 1. Left brachiocephalic AV fistula had an area in the cephalic vein just beyond the anastomosis with a relatively high-grade stenosis in the 80 to 85% range.  The brachial artery was patent.  The cephalic vein beyond the perianastomotic stenosis had a previously placed stent with minimal residual stenosis in the 15 to 20% range.  There were no further stenosis within the cephalic vein all the way to the subclavian vein.  SPECIMEN(S):  None  CONTRAST: 20 cc  FLUORO TIME: 1.5 minutes  MODERATE CONSCIOUS SEDATION TIME: Approximately 20 minutes with 3.5 mg of Versed and 125 mcg of Fentanyl   INDICATIONS: Kimberly Frost is a 69 y.o. female who presents with malfunctioning left brachiocephalic arteriovenous fistula.  The patient is scheduled for left arm fistulagram.  The patient is aware the risks include but are not limited to: bleeding, infection, thrombosis of the cannulated access, and possible anaphylactic reaction to the contrast.  The patient is aware of the risks of the procedure and elects to proceed forward.  DESCRIPTION: After full informed written consent was obtained, the patient was brought back to the angiography suite and placed supine upon the angiography table.  The patient was connected to monitoring equipment. Moderate conscious sedation was administered with a face to face encounter with the patient  throughout the procedure with my supervision of the RN administering medicines and monitoring the patient's vital signs and mental status throughout from the start of the procedure until the patient was taken to the recovery room. The left arm was prepped and draped in the standard fashion for a percutaneous access intervention.  Under ultrasound guidance, the left brachiocephalic arteriovenous fistula was cannulated with a micropuncture needle under direct ultrasound guidance in the mid upper arm in a retrograde fashion where it was patent and a permanent image was performed.  The microwire was advanced into the fistula and the needle was exchanged for the a microsheath.  I then upsized to a 6 Fr Sheath and imaging was performed.  Hand injections were completed to image the access including the central venous system. This demonstrated that the left brachiocephalic AV fistula had an area in the cephalic vein just beyond the anastomosis with a relatively high-grade stenosis in the 80 to 85% range.  The brachial artery was patent.  The cephalic vein beyond the perianastomotic stenosis had a previously placed stent with minimal residual stenosis in the 15 to 20% range.  There were no further stenosis within the cephalic vein all the way to the subclavian vein.  Based on the images, this patient will need intervention to the perianastomotic cephalic vein stenosis. I then gave the patient 3000 units of intravenous heparin.  I then crossed the stenosis with a Magic Tourqe wire.  Based on the imaging, a 6 mm x 6 cm Lutonix drug-coated angioplasty balloon was selected.  The balloon was centered around the perianastomotic cephalic vein stenosis and inflated to 14 ATM for 1 minute(s).  On  completion imaging, a 10-15% residual stenosis was present.     Based on the completion imaging, no further intervention is necessary.  The wire and balloon were removed from the sheath.  A 4-0 Monocryl purse-string suture was sewn  around the sheath.  The sheath was removed while tying down the suture.  A sterile bandage was applied to the puncture site.  COMPLICATIONS: None  CONDITION: Stable   Leotis Pain  05/01/2019 3:10 PM   This note was created with Dragon Medical transcription system. Any errors in dictation are purely unintentional.

## 2019-05-01 NOTE — H&P (Signed)
[facility] History & Physical Update  The patient was interviewed and re-examined.  The patient's previous History and Physical has been reviewed and is unchanged.  There is no change in the plan of care. We plan to proceed with the scheduled procedure.  Leotis Pain, MD  05/01/2019, 12:00 PM

## 2019-05-02 ENCOUNTER — Encounter: Payer: Self-pay | Admitting: Cardiology

## 2019-05-02 DIAGNOSIS — Z992 Dependence on renal dialysis: Secondary | ICD-10-CM | POA: Diagnosis not present

## 2019-05-02 DIAGNOSIS — D689 Coagulation defect, unspecified: Secondary | ICD-10-CM | POA: Diagnosis not present

## 2019-05-02 DIAGNOSIS — N2581 Secondary hyperparathyroidism of renal origin: Secondary | ICD-10-CM | POA: Diagnosis not present

## 2019-05-02 DIAGNOSIS — N186 End stage renal disease: Secondary | ICD-10-CM | POA: Diagnosis not present

## 2019-05-02 DIAGNOSIS — D508 Other iron deficiency anemias: Secondary | ICD-10-CM | POA: Diagnosis not present

## 2019-05-02 DIAGNOSIS — D631 Anemia in chronic kidney disease: Secondary | ICD-10-CM | POA: Diagnosis not present

## 2019-05-03 DIAGNOSIS — I129 Hypertensive chronic kidney disease with stage 1 through stage 4 chronic kidney disease, or unspecified chronic kidney disease: Secondary | ICD-10-CM | POA: Diagnosis not present

## 2019-05-03 DIAGNOSIS — N186 End stage renal disease: Secondary | ICD-10-CM | POA: Diagnosis not present

## 2019-05-03 DIAGNOSIS — Z992 Dependence on renal dialysis: Secondary | ICD-10-CM | POA: Diagnosis not present

## 2019-05-04 DIAGNOSIS — N2581 Secondary hyperparathyroidism of renal origin: Secondary | ICD-10-CM | POA: Diagnosis not present

## 2019-05-04 DIAGNOSIS — Z992 Dependence on renal dialysis: Secondary | ICD-10-CM | POA: Diagnosis not present

## 2019-05-04 DIAGNOSIS — D508 Other iron deficiency anemias: Secondary | ICD-10-CM | POA: Diagnosis not present

## 2019-05-04 DIAGNOSIS — N186 End stage renal disease: Secondary | ICD-10-CM | POA: Diagnosis not present

## 2019-05-04 DIAGNOSIS — D689 Coagulation defect, unspecified: Secondary | ICD-10-CM | POA: Diagnosis not present

## 2019-05-06 DIAGNOSIS — N2581 Secondary hyperparathyroidism of renal origin: Secondary | ICD-10-CM | POA: Diagnosis not present

## 2019-05-06 DIAGNOSIS — N186 End stage renal disease: Secondary | ICD-10-CM | POA: Diagnosis not present

## 2019-05-06 DIAGNOSIS — D689 Coagulation defect, unspecified: Secondary | ICD-10-CM | POA: Diagnosis not present

## 2019-05-06 DIAGNOSIS — D508 Other iron deficiency anemias: Secondary | ICD-10-CM | POA: Diagnosis not present

## 2019-05-06 DIAGNOSIS — Z992 Dependence on renal dialysis: Secondary | ICD-10-CM | POA: Diagnosis not present

## 2019-05-09 DIAGNOSIS — N186 End stage renal disease: Secondary | ICD-10-CM | POA: Diagnosis not present

## 2019-05-09 DIAGNOSIS — N2581 Secondary hyperparathyroidism of renal origin: Secondary | ICD-10-CM | POA: Diagnosis not present

## 2019-05-09 DIAGNOSIS — D689 Coagulation defect, unspecified: Secondary | ICD-10-CM | POA: Diagnosis not present

## 2019-05-09 DIAGNOSIS — D508 Other iron deficiency anemias: Secondary | ICD-10-CM | POA: Diagnosis not present

## 2019-05-09 DIAGNOSIS — Z992 Dependence on renal dialysis: Secondary | ICD-10-CM | POA: Diagnosis not present

## 2019-05-11 DIAGNOSIS — N186 End stage renal disease: Secondary | ICD-10-CM | POA: Diagnosis not present

## 2019-05-11 DIAGNOSIS — D508 Other iron deficiency anemias: Secondary | ICD-10-CM | POA: Diagnosis not present

## 2019-05-11 DIAGNOSIS — N2581 Secondary hyperparathyroidism of renal origin: Secondary | ICD-10-CM | POA: Diagnosis not present

## 2019-05-11 DIAGNOSIS — Z992 Dependence on renal dialysis: Secondary | ICD-10-CM | POA: Diagnosis not present

## 2019-05-11 DIAGNOSIS — D689 Coagulation defect, unspecified: Secondary | ICD-10-CM | POA: Diagnosis not present

## 2019-05-13 DIAGNOSIS — N2581 Secondary hyperparathyroidism of renal origin: Secondary | ICD-10-CM | POA: Diagnosis not present

## 2019-05-13 DIAGNOSIS — Z992 Dependence on renal dialysis: Secondary | ICD-10-CM | POA: Diagnosis not present

## 2019-05-13 DIAGNOSIS — D689 Coagulation defect, unspecified: Secondary | ICD-10-CM | POA: Diagnosis not present

## 2019-05-13 DIAGNOSIS — N186 End stage renal disease: Secondary | ICD-10-CM | POA: Diagnosis not present

## 2019-05-13 DIAGNOSIS — D508 Other iron deficiency anemias: Secondary | ICD-10-CM | POA: Diagnosis not present

## 2019-05-16 DIAGNOSIS — N2581 Secondary hyperparathyroidism of renal origin: Secondary | ICD-10-CM | POA: Diagnosis not present

## 2019-05-16 DIAGNOSIS — Z992 Dependence on renal dialysis: Secondary | ICD-10-CM | POA: Diagnosis not present

## 2019-05-16 DIAGNOSIS — D689 Coagulation defect, unspecified: Secondary | ICD-10-CM | POA: Diagnosis not present

## 2019-05-16 DIAGNOSIS — D508 Other iron deficiency anemias: Secondary | ICD-10-CM | POA: Diagnosis not present

## 2019-05-16 DIAGNOSIS — N186 End stage renal disease: Secondary | ICD-10-CM | POA: Diagnosis not present

## 2019-05-18 DIAGNOSIS — D508 Other iron deficiency anemias: Secondary | ICD-10-CM | POA: Diagnosis not present

## 2019-05-18 DIAGNOSIS — N2581 Secondary hyperparathyroidism of renal origin: Secondary | ICD-10-CM | POA: Diagnosis not present

## 2019-05-18 DIAGNOSIS — Z992 Dependence on renal dialysis: Secondary | ICD-10-CM | POA: Diagnosis not present

## 2019-05-18 DIAGNOSIS — N186 End stage renal disease: Secondary | ICD-10-CM | POA: Diagnosis not present

## 2019-05-18 DIAGNOSIS — D689 Coagulation defect, unspecified: Secondary | ICD-10-CM | POA: Diagnosis not present

## 2019-05-20 DIAGNOSIS — N186 End stage renal disease: Secondary | ICD-10-CM | POA: Diagnosis not present

## 2019-05-20 DIAGNOSIS — D508 Other iron deficiency anemias: Secondary | ICD-10-CM | POA: Diagnosis not present

## 2019-05-20 DIAGNOSIS — Z992 Dependence on renal dialysis: Secondary | ICD-10-CM | POA: Diagnosis not present

## 2019-05-20 DIAGNOSIS — D689 Coagulation defect, unspecified: Secondary | ICD-10-CM | POA: Diagnosis not present

## 2019-05-20 DIAGNOSIS — N2581 Secondary hyperparathyroidism of renal origin: Secondary | ICD-10-CM | POA: Diagnosis not present

## 2019-05-23 DIAGNOSIS — N2581 Secondary hyperparathyroidism of renal origin: Secondary | ICD-10-CM | POA: Diagnosis not present

## 2019-05-23 DIAGNOSIS — D508 Other iron deficiency anemias: Secondary | ICD-10-CM | POA: Diagnosis not present

## 2019-05-23 DIAGNOSIS — Z992 Dependence on renal dialysis: Secondary | ICD-10-CM | POA: Diagnosis not present

## 2019-05-23 DIAGNOSIS — D689 Coagulation defect, unspecified: Secondary | ICD-10-CM | POA: Diagnosis not present

## 2019-05-23 DIAGNOSIS — N186 End stage renal disease: Secondary | ICD-10-CM | POA: Diagnosis not present

## 2019-05-25 DIAGNOSIS — D508 Other iron deficiency anemias: Secondary | ICD-10-CM | POA: Diagnosis not present

## 2019-05-25 DIAGNOSIS — N186 End stage renal disease: Secondary | ICD-10-CM | POA: Diagnosis not present

## 2019-05-25 DIAGNOSIS — N2581 Secondary hyperparathyroidism of renal origin: Secondary | ICD-10-CM | POA: Diagnosis not present

## 2019-05-25 DIAGNOSIS — D689 Coagulation defect, unspecified: Secondary | ICD-10-CM | POA: Diagnosis not present

## 2019-05-25 DIAGNOSIS — Z992 Dependence on renal dialysis: Secondary | ICD-10-CM | POA: Diagnosis not present

## 2019-05-27 DIAGNOSIS — Z992 Dependence on renal dialysis: Secondary | ICD-10-CM | POA: Diagnosis not present

## 2019-05-27 DIAGNOSIS — N186 End stage renal disease: Secondary | ICD-10-CM | POA: Diagnosis not present

## 2019-05-27 DIAGNOSIS — N2581 Secondary hyperparathyroidism of renal origin: Secondary | ICD-10-CM | POA: Diagnosis not present

## 2019-05-27 DIAGNOSIS — D689 Coagulation defect, unspecified: Secondary | ICD-10-CM | POA: Diagnosis not present

## 2019-05-27 DIAGNOSIS — D508 Other iron deficiency anemias: Secondary | ICD-10-CM | POA: Diagnosis not present

## 2019-05-30 DIAGNOSIS — D689 Coagulation defect, unspecified: Secondary | ICD-10-CM | POA: Diagnosis not present

## 2019-05-30 DIAGNOSIS — D508 Other iron deficiency anemias: Secondary | ICD-10-CM | POA: Diagnosis not present

## 2019-05-30 DIAGNOSIS — N186 End stage renal disease: Secondary | ICD-10-CM | POA: Diagnosis not present

## 2019-05-30 DIAGNOSIS — Z992 Dependence on renal dialysis: Secondary | ICD-10-CM | POA: Diagnosis not present

## 2019-05-30 DIAGNOSIS — N2581 Secondary hyperparathyroidism of renal origin: Secondary | ICD-10-CM | POA: Diagnosis not present

## 2019-05-31 ENCOUNTER — Other Ambulatory Visit (INDEPENDENT_AMBULATORY_CARE_PROVIDER_SITE_OTHER): Payer: Self-pay | Admitting: Vascular Surgery

## 2019-05-31 DIAGNOSIS — T829XXS Unspecified complication of cardiac and vascular prosthetic device, implant and graft, sequela: Secondary | ICD-10-CM

## 2019-05-31 DIAGNOSIS — Z9862 Peripheral vascular angioplasty status: Secondary | ICD-10-CM

## 2019-06-01 DIAGNOSIS — D689 Coagulation defect, unspecified: Secondary | ICD-10-CM | POA: Diagnosis not present

## 2019-06-01 DIAGNOSIS — N186 End stage renal disease: Secondary | ICD-10-CM | POA: Diagnosis not present

## 2019-06-01 DIAGNOSIS — N2581 Secondary hyperparathyroidism of renal origin: Secondary | ICD-10-CM | POA: Diagnosis not present

## 2019-06-01 DIAGNOSIS — Z992 Dependence on renal dialysis: Secondary | ICD-10-CM | POA: Diagnosis not present

## 2019-06-01 DIAGNOSIS — D508 Other iron deficiency anemias: Secondary | ICD-10-CM | POA: Diagnosis not present

## 2019-06-02 DIAGNOSIS — N186 End stage renal disease: Secondary | ICD-10-CM | POA: Diagnosis not present

## 2019-06-02 DIAGNOSIS — I129 Hypertensive chronic kidney disease with stage 1 through stage 4 chronic kidney disease, or unspecified chronic kidney disease: Secondary | ICD-10-CM | POA: Diagnosis not present

## 2019-06-02 DIAGNOSIS — Z992 Dependence on renal dialysis: Secondary | ICD-10-CM | POA: Diagnosis not present

## 2019-06-03 DIAGNOSIS — D689 Coagulation defect, unspecified: Secondary | ICD-10-CM | POA: Diagnosis not present

## 2019-06-03 DIAGNOSIS — N186 End stage renal disease: Secondary | ICD-10-CM | POA: Diagnosis not present

## 2019-06-03 DIAGNOSIS — Z992 Dependence on renal dialysis: Secondary | ICD-10-CM | POA: Diagnosis not present

## 2019-06-03 DIAGNOSIS — N2581 Secondary hyperparathyroidism of renal origin: Secondary | ICD-10-CM | POA: Diagnosis not present

## 2019-06-06 DIAGNOSIS — D689 Coagulation defect, unspecified: Secondary | ICD-10-CM | POA: Diagnosis not present

## 2019-06-06 DIAGNOSIS — Z992 Dependence on renal dialysis: Secondary | ICD-10-CM | POA: Diagnosis not present

## 2019-06-06 DIAGNOSIS — N186 End stage renal disease: Secondary | ICD-10-CM | POA: Diagnosis not present

## 2019-06-06 DIAGNOSIS — N2581 Secondary hyperparathyroidism of renal origin: Secondary | ICD-10-CM | POA: Diagnosis not present

## 2019-06-08 DIAGNOSIS — N186 End stage renal disease: Secondary | ICD-10-CM | POA: Diagnosis not present

## 2019-06-08 DIAGNOSIS — D689 Coagulation defect, unspecified: Secondary | ICD-10-CM | POA: Diagnosis not present

## 2019-06-08 DIAGNOSIS — N2581 Secondary hyperparathyroidism of renal origin: Secondary | ICD-10-CM | POA: Diagnosis not present

## 2019-06-08 DIAGNOSIS — Z992 Dependence on renal dialysis: Secondary | ICD-10-CM | POA: Diagnosis not present

## 2019-06-10 DIAGNOSIS — N2581 Secondary hyperparathyroidism of renal origin: Secondary | ICD-10-CM | POA: Diagnosis not present

## 2019-06-10 DIAGNOSIS — Z992 Dependence on renal dialysis: Secondary | ICD-10-CM | POA: Diagnosis not present

## 2019-06-10 DIAGNOSIS — D689 Coagulation defect, unspecified: Secondary | ICD-10-CM | POA: Diagnosis not present

## 2019-06-10 DIAGNOSIS — N186 End stage renal disease: Secondary | ICD-10-CM | POA: Diagnosis not present

## 2019-06-13 DIAGNOSIS — D689 Coagulation defect, unspecified: Secondary | ICD-10-CM | POA: Diagnosis not present

## 2019-06-13 DIAGNOSIS — N186 End stage renal disease: Secondary | ICD-10-CM | POA: Diagnosis not present

## 2019-06-13 DIAGNOSIS — Z992 Dependence on renal dialysis: Secondary | ICD-10-CM | POA: Diagnosis not present

## 2019-06-13 DIAGNOSIS — N2581 Secondary hyperparathyroidism of renal origin: Secondary | ICD-10-CM | POA: Diagnosis not present

## 2019-06-14 ENCOUNTER — Other Ambulatory Visit: Payer: Self-pay

## 2019-06-14 ENCOUNTER — Ambulatory Visit (INDEPENDENT_AMBULATORY_CARE_PROVIDER_SITE_OTHER): Payer: Medicare Other

## 2019-06-14 ENCOUNTER — Ambulatory Visit (INDEPENDENT_AMBULATORY_CARE_PROVIDER_SITE_OTHER): Payer: Medicare Other | Admitting: Nurse Practitioner

## 2019-06-14 ENCOUNTER — Encounter (INDEPENDENT_AMBULATORY_CARE_PROVIDER_SITE_OTHER): Payer: Self-pay | Admitting: Nurse Practitioner

## 2019-06-14 VITALS — BP 155/88 | HR 96 | Ht 67.0 in | Wt 128.0 lb

## 2019-06-14 DIAGNOSIS — N186 End stage renal disease: Secondary | ICD-10-CM | POA: Diagnosis not present

## 2019-06-14 DIAGNOSIS — T829XXS Unspecified complication of cardiac and vascular prosthetic device, implant and graft, sequela: Secondary | ICD-10-CM | POA: Diagnosis not present

## 2019-06-14 DIAGNOSIS — Z9862 Peripheral vascular angioplasty status: Secondary | ICD-10-CM

## 2019-06-14 DIAGNOSIS — I10 Essential (primary) hypertension: Secondary | ICD-10-CM | POA: Diagnosis not present

## 2019-06-14 NOTE — Progress Notes (Signed)
Subjective:    Patient ID: Kimberly Frost, female    DOB: 12/27/1950, 69 y.o.   MRN: [id number] Chief Complaint  Patient presents with  . Follow-up    6 week [facility] post fistula    The patient returns to the office for followup status post intervention of the dialysis access left brachiocephalic AV fistula. Following the intervention the access function has significantly improved, with better flow rates and improved KT/V. The patient has not been experiencing increased bleeding times following decannulation and the patient denies increased recirculation. The patient denies an increase in arm swelling. At the present time the patient denies hand pain.  The patient denies amaurosis fugax or recent TIA symptoms. There are no recent neurological changes noted. The patient denies claudication symptoms or rest pain symptoms. The patient denies history of DVT, PE or superficial thrombophlebitis. The patient denies recent episodes of angina or shortness of breath.   Today the patient has a flow volume of 1985 increased from a previous flow volume of 356.  There are some elevated velocities near the AV fistula anastomosis as well as the distal upper arm.     Review of Systems  All other systems reviewed and are negative.      Objective:   Physical Exam Vitals reviewed.  Cardiovascular:     Rate and Rhythm: Normal rate and regular rhythm.     Pulses:          Radial pulses are 2+ on the right side and 2+ on the left side.     Heart sounds: Normal heart sounds.     Arteriovenous access: left arteriovenous access is present.    Comments: Good thrill and bruit Pulmonary:     Effort: Pulmonary effort is normal.     Breath sounds: Normal breath sounds.  Neurological:     Mental Status: She is alert and oriented to person, place, and time.  Psychiatric:        Mood and Affect: Mood normal.        Behavior: Behavior normal.        Thought Content: Thought content normal.        Judgment:  Judgment normal.     BP (!) 155/88   Pulse 96   Ht 5\' 7"  (1.702 m)   Wt 128 lb (58.1 kg)   BMI 20.05 kg/m   Past Medical History:  Diagnosis Date  . Chronic kidney disease   . Edema    RLE  . Hepatitis    Hep C  . History of blood transfusion   . Hypertension   . Wears dentures     Social History   Socioeconomic History  . Marital status: Single    Spouse name: Not on file  . Number of children: 0  . Years of education: Not on file  . Highest education level: Not on file  Occupational History  . Occupation: on Fish farm manager   Tobacco Use  . Smoking status: Former Smoker    Types: Cigarettes  . Smokeless tobacco: Never Used  Substance and Sexual Activity  . Alcohol use: Not Currently    Alcohol/week: 6.0 standard drinks    Types: 6 Cans of beer per week  . Drug use: Not Currently    Types: Marijuana    Comment: occasional   . Sexual activity: Not Currently  Other Topics Concern  . Not on file  Social History Narrative   Lives at home in private residence with fiance' [place]  Social Determinants of Health   Financial Resource Strain:   . Difficulty of Paying Living Expenses:   Food Insecurity:   . Worried About Charity fundraiser in the Last Year:   . Arboriculturist in the Last Year:   Transportation Needs:   . Film/video editor (Medical):   Marland Kitchen Lack of Transportation (Non-Medical):   Physical Activity:   . Days of Exercise per Week:   . Minutes of Exercise per Session:   Stress:   . Feeling of Stress :   Social Connections:   . Frequency of Communication with Friends and Family:   . Frequency of Social Gatherings with Friends and Family:   . Attends Religious Services:   . Active Member of Clubs or Organizations:   . Attends Archivist Meetings:   Marland Kitchen Marital Status:   Intimate Partner Violence:   . Fear of Current or Ex-Partner:   . Emotionally Abused:   Marland Kitchen Physically Abused:   . Sexually Abused:     Past Surgical History:    Procedure Laterality Date  . A/V FISTULAGRAM Left 04/11/2019   Procedure: A/V FISTULAGRAM;  Surgeon: Katha Cabal, MD;  Location: [facility];  Service: Cardiovascular;  Laterality: Left;  . A/V FISTULAGRAM Left 05/01/2019   Procedure: A/V FISTULAGRAM;  Surgeon: Algernon Huxley, MD;  Location: [facility];  Service: Cardiovascular;  Laterality: Left;  . AV FISTULA PLACEMENT Left 06/16/2018   Procedure: Left Arm ARTERIOVENOUS (AV) FISTULA CREATION;  Surgeon: Marty Heck, MD;  Location: [facility];  Service: Vascular;  Laterality: Left;  Marland Kitchen MULTIPLE TOOTH EXTRACTIONS    . MYOMECTOMY      Family History  Problem Relation Age of Onset  . Diabetes Mother   . Hypertension Mother   . Bone cancer Father   . Kidney cancer Maternal Grandmother   . Stroke Brother   . Diabetes Brother     No Known Allergies     Assessment & Plan:   1. ESRD (end stage renal disease) ([place]) Recommend:  The patient is doing well and currently has adequate dialysis access. The patient's dialysis center is not reporting any access issues. Flow pattern is stable when compared to the prior ultrasound.  The patient should have a duplex ultrasound of the dialysis access in 6 months. The patient will follow-up with me in the office after each ultrasound     2. Essential hypertension Continue antihypertensive medications as already ordered, these medications have been reviewed and there are no changes at this time.    Current Outpatient Medications on File Prior to Visit  Medication Sig Dispense Refill  . acetaminophen (TYLENOL) 500 MG tablet Take 1,000 mg by mouth every 6 (six) hours as needed for moderate pain or headache.    . multivitamin (RENA-VIT) TABS tablet Take 1 tablet by mouth daily.    . VELPHORO 500 MG chewable tablet Chew 1,000 mg by mouth See admin instructions. Take 1000 mg with meals and 500 mg with snacks    . midodrine (PROAMATINE) 10 MG tablet Take 10 mg by mouth in  the morning and at bedtime.     No current facility-administered medications on file prior to visit.    There are no Patient Instructions on file for this visit. No follow-ups on file.   Kris Hartmann, NP

## 2019-06-15 DIAGNOSIS — Z992 Dependence on renal dialysis: Secondary | ICD-10-CM | POA: Diagnosis not present

## 2019-06-15 DIAGNOSIS — N2581 Secondary hyperparathyroidism of renal origin: Secondary | ICD-10-CM | POA: Diagnosis not present

## 2019-06-15 DIAGNOSIS — D689 Coagulation defect, unspecified: Secondary | ICD-10-CM | POA: Diagnosis not present

## 2019-06-15 DIAGNOSIS — N186 End stage renal disease: Secondary | ICD-10-CM | POA: Diagnosis not present

## 2019-06-17 DIAGNOSIS — D689 Coagulation defect, unspecified: Secondary | ICD-10-CM | POA: Diagnosis not present

## 2019-06-17 DIAGNOSIS — N2581 Secondary hyperparathyroidism of renal origin: Secondary | ICD-10-CM | POA: Diagnosis not present

## 2019-06-17 DIAGNOSIS — N186 End stage renal disease: Secondary | ICD-10-CM | POA: Diagnosis not present

## 2019-06-17 DIAGNOSIS — Z992 Dependence on renal dialysis: Secondary | ICD-10-CM | POA: Diagnosis not present

## 2019-06-20 DIAGNOSIS — Z992 Dependence on renal dialysis: Secondary | ICD-10-CM | POA: Diagnosis not present

## 2019-06-20 DIAGNOSIS — N2581 Secondary hyperparathyroidism of renal origin: Secondary | ICD-10-CM | POA: Diagnosis not present

## 2019-06-20 DIAGNOSIS — N186 End stage renal disease: Secondary | ICD-10-CM | POA: Diagnosis not present

## 2019-06-20 DIAGNOSIS — D689 Coagulation defect, unspecified: Secondary | ICD-10-CM | POA: Diagnosis not present

## 2019-06-22 DIAGNOSIS — N186 End stage renal disease: Secondary | ICD-10-CM | POA: Diagnosis not present

## 2019-06-22 DIAGNOSIS — N2581 Secondary hyperparathyroidism of renal origin: Secondary | ICD-10-CM | POA: Diagnosis not present

## 2019-06-22 DIAGNOSIS — Z992 Dependence on renal dialysis: Secondary | ICD-10-CM | POA: Diagnosis not present

## 2019-06-22 DIAGNOSIS — D689 Coagulation defect, unspecified: Secondary | ICD-10-CM | POA: Diagnosis not present

## 2019-06-24 DIAGNOSIS — D689 Coagulation defect, unspecified: Secondary | ICD-10-CM | POA: Diagnosis not present

## 2019-06-24 DIAGNOSIS — N186 End stage renal disease: Secondary | ICD-10-CM | POA: Diagnosis not present

## 2019-06-24 DIAGNOSIS — Z992 Dependence on renal dialysis: Secondary | ICD-10-CM | POA: Diagnosis not present

## 2019-06-24 DIAGNOSIS — N2581 Secondary hyperparathyroidism of renal origin: Secondary | ICD-10-CM | POA: Diagnosis not present

## 2019-06-27 DIAGNOSIS — N2581 Secondary hyperparathyroidism of renal origin: Secondary | ICD-10-CM | POA: Diagnosis not present

## 2019-06-27 DIAGNOSIS — N186 End stage renal disease: Secondary | ICD-10-CM | POA: Diagnosis not present

## 2019-06-27 DIAGNOSIS — Z992 Dependence on renal dialysis: Secondary | ICD-10-CM | POA: Diagnosis not present

## 2019-06-27 DIAGNOSIS — D689 Coagulation defect, unspecified: Secondary | ICD-10-CM | POA: Diagnosis not present

## 2019-06-29 DIAGNOSIS — N186 End stage renal disease: Secondary | ICD-10-CM | POA: Diagnosis not present

## 2019-06-29 DIAGNOSIS — Z992 Dependence on renal dialysis: Secondary | ICD-10-CM | POA: Diagnosis not present

## 2019-06-29 DIAGNOSIS — D689 Coagulation defect, unspecified: Secondary | ICD-10-CM | POA: Diagnosis not present

## 2019-06-29 DIAGNOSIS — N2581 Secondary hyperparathyroidism of renal origin: Secondary | ICD-10-CM | POA: Diagnosis not present

## 2019-07-01 DIAGNOSIS — N2581 Secondary hyperparathyroidism of renal origin: Secondary | ICD-10-CM | POA: Diagnosis not present

## 2019-07-01 DIAGNOSIS — D689 Coagulation defect, unspecified: Secondary | ICD-10-CM | POA: Diagnosis not present

## 2019-07-01 DIAGNOSIS — N186 End stage renal disease: Secondary | ICD-10-CM | POA: Diagnosis not present

## 2019-07-01 DIAGNOSIS — Z992 Dependence on renal dialysis: Secondary | ICD-10-CM | POA: Diagnosis not present

## 2019-07-03 DIAGNOSIS — N186 End stage renal disease: Secondary | ICD-10-CM | POA: Diagnosis not present

## 2019-07-03 DIAGNOSIS — I129 Hypertensive chronic kidney disease with stage 1 through stage 4 chronic kidney disease, or unspecified chronic kidney disease: Secondary | ICD-10-CM | POA: Diagnosis not present

## 2019-07-03 DIAGNOSIS — Z992 Dependence on renal dialysis: Secondary | ICD-10-CM | POA: Diagnosis not present

## 2019-07-04 DIAGNOSIS — D631 Anemia in chronic kidney disease: Secondary | ICD-10-CM | POA: Diagnosis not present

## 2019-07-04 DIAGNOSIS — Z992 Dependence on renal dialysis: Secondary | ICD-10-CM | POA: Diagnosis not present

## 2019-07-04 DIAGNOSIS — N2581 Secondary hyperparathyroidism of renal origin: Secondary | ICD-10-CM | POA: Diagnosis not present

## 2019-07-04 DIAGNOSIS — D689 Coagulation defect, unspecified: Secondary | ICD-10-CM | POA: Diagnosis not present

## 2019-07-04 DIAGNOSIS — N186 End stage renal disease: Secondary | ICD-10-CM | POA: Diagnosis not present

## 2019-07-04 DIAGNOSIS — Z23 Encounter for immunization: Secondary | ICD-10-CM | POA: Diagnosis not present

## 2019-07-05 ENCOUNTER — Encounter: Payer: Medicare Other | Admitting: Internal Medicine

## 2019-07-05 DIAGNOSIS — N186 End stage renal disease: Secondary | ICD-10-CM | POA: Diagnosis not present

## 2019-07-05 DIAGNOSIS — D689 Coagulation defect, unspecified: Secondary | ICD-10-CM | POA: Diagnosis not present

## 2019-07-05 DIAGNOSIS — D631 Anemia in chronic kidney disease: Secondary | ICD-10-CM | POA: Diagnosis not present

## 2019-07-05 DIAGNOSIS — E8779 Other fluid overload: Secondary | ICD-10-CM | POA: Diagnosis not present

## 2019-07-05 DIAGNOSIS — Z23 Encounter for immunization: Secondary | ICD-10-CM | POA: Diagnosis not present

## 2019-07-05 DIAGNOSIS — N2581 Secondary hyperparathyroidism of renal origin: Secondary | ICD-10-CM | POA: Diagnosis not present

## 2019-07-05 DIAGNOSIS — Z992 Dependence on renal dialysis: Secondary | ICD-10-CM | POA: Diagnosis not present

## 2019-07-06 DIAGNOSIS — Z992 Dependence on renal dialysis: Secondary | ICD-10-CM | POA: Diagnosis not present

## 2019-07-06 DIAGNOSIS — Z23 Encounter for immunization: Secondary | ICD-10-CM | POA: Diagnosis not present

## 2019-07-06 DIAGNOSIS — N2581 Secondary hyperparathyroidism of renal origin: Secondary | ICD-10-CM | POA: Diagnosis not present

## 2019-07-06 DIAGNOSIS — N186 End stage renal disease: Secondary | ICD-10-CM | POA: Diagnosis not present

## 2019-07-06 DIAGNOSIS — D689 Coagulation defect, unspecified: Secondary | ICD-10-CM | POA: Diagnosis not present

## 2019-07-06 DIAGNOSIS — D631 Anemia in chronic kidney disease: Secondary | ICD-10-CM | POA: Diagnosis not present

## 2019-07-08 DIAGNOSIS — D689 Coagulation defect, unspecified: Secondary | ICD-10-CM | POA: Diagnosis not present

## 2019-07-08 DIAGNOSIS — D631 Anemia in chronic kidney disease: Secondary | ICD-10-CM | POA: Diagnosis not present

## 2019-07-08 DIAGNOSIS — N2581 Secondary hyperparathyroidism of renal origin: Secondary | ICD-10-CM | POA: Diagnosis not present

## 2019-07-08 DIAGNOSIS — Z992 Dependence on renal dialysis: Secondary | ICD-10-CM | POA: Diagnosis not present

## 2019-07-08 DIAGNOSIS — Z23 Encounter for immunization: Secondary | ICD-10-CM | POA: Diagnosis not present

## 2019-07-08 DIAGNOSIS — N186 End stage renal disease: Secondary | ICD-10-CM | POA: Diagnosis not present

## 2019-07-11 DIAGNOSIS — Z992 Dependence on renal dialysis: Secondary | ICD-10-CM | POA: Diagnosis not present

## 2019-07-11 DIAGNOSIS — N2581 Secondary hyperparathyroidism of renal origin: Secondary | ICD-10-CM | POA: Diagnosis not present

## 2019-07-11 DIAGNOSIS — Z23 Encounter for immunization: Secondary | ICD-10-CM | POA: Diagnosis not present

## 2019-07-11 DIAGNOSIS — D631 Anemia in chronic kidney disease: Secondary | ICD-10-CM | POA: Diagnosis not present

## 2019-07-11 DIAGNOSIS — N186 End stage renal disease: Secondary | ICD-10-CM | POA: Diagnosis not present

## 2019-07-11 DIAGNOSIS — D689 Coagulation defect, unspecified: Secondary | ICD-10-CM | POA: Diagnosis not present

## 2019-07-13 ENCOUNTER — Other Ambulatory Visit (INDEPENDENT_AMBULATORY_CARE_PROVIDER_SITE_OTHER): Payer: Self-pay | Admitting: Nurse Practitioner

## 2019-07-13 ENCOUNTER — Other Ambulatory Visit (INDEPENDENT_AMBULATORY_CARE_PROVIDER_SITE_OTHER): Payer: Self-pay | Admitting: Vascular Surgery

## 2019-07-13 DIAGNOSIS — N186 End stage renal disease: Secondary | ICD-10-CM | POA: Diagnosis not present

## 2019-07-13 DIAGNOSIS — N2581 Secondary hyperparathyroidism of renal origin: Secondary | ICD-10-CM | POA: Diagnosis not present

## 2019-07-13 DIAGNOSIS — Z23 Encounter for immunization: Secondary | ICD-10-CM | POA: Diagnosis not present

## 2019-07-13 DIAGNOSIS — D689 Coagulation defect, unspecified: Secondary | ICD-10-CM | POA: Diagnosis not present

## 2019-07-13 DIAGNOSIS — Z992 Dependence on renal dialysis: Secondary | ICD-10-CM | POA: Diagnosis not present

## 2019-07-13 DIAGNOSIS — D631 Anemia in chronic kidney disease: Secondary | ICD-10-CM | POA: Diagnosis not present

## 2019-07-13 DIAGNOSIS — T829XXS Unspecified complication of cardiac and vascular prosthetic device, implant and graft, sequela: Secondary | ICD-10-CM

## 2019-07-14 ENCOUNTER — Ambulatory Visit (INDEPENDENT_AMBULATORY_CARE_PROVIDER_SITE_OTHER): Payer: Medicare Other | Admitting: Nurse Practitioner

## 2019-07-14 ENCOUNTER — Encounter (INDEPENDENT_AMBULATORY_CARE_PROVIDER_SITE_OTHER): Payer: Self-pay | Admitting: Nurse Practitioner

## 2019-07-14 ENCOUNTER — Ambulatory Visit (INDEPENDENT_AMBULATORY_CARE_PROVIDER_SITE_OTHER): Payer: Medicare Other

## 2019-07-14 ENCOUNTER — Other Ambulatory Visit: Payer: Self-pay

## 2019-07-14 VITALS — BP 147/78 | HR 96 | Ht 67.0 in | Wt 129.0 lb

## 2019-07-14 DIAGNOSIS — N186 End stage renal disease: Secondary | ICD-10-CM

## 2019-07-14 DIAGNOSIS — I10 Essential (primary) hypertension: Secondary | ICD-10-CM

## 2019-07-14 DIAGNOSIS — T829XXS Unspecified complication of cardiac and vascular prosthetic device, implant and graft, sequela: Secondary | ICD-10-CM

## 2019-07-15 DIAGNOSIS — D631 Anemia in chronic kidney disease: Secondary | ICD-10-CM | POA: Diagnosis not present

## 2019-07-15 DIAGNOSIS — N186 End stage renal disease: Secondary | ICD-10-CM | POA: Diagnosis not present

## 2019-07-15 DIAGNOSIS — N2581 Secondary hyperparathyroidism of renal origin: Secondary | ICD-10-CM | POA: Diagnosis not present

## 2019-07-15 DIAGNOSIS — D689 Coagulation defect, unspecified: Secondary | ICD-10-CM | POA: Diagnosis not present

## 2019-07-15 DIAGNOSIS — Z23 Encounter for immunization: Secondary | ICD-10-CM | POA: Diagnosis not present

## 2019-07-15 DIAGNOSIS — Z992 Dependence on renal dialysis: Secondary | ICD-10-CM | POA: Diagnosis not present

## 2019-07-17 ENCOUNTER — Encounter (INDEPENDENT_AMBULATORY_CARE_PROVIDER_SITE_OTHER): Payer: Self-pay | Admitting: Nurse Practitioner

## 2019-07-17 NOTE — Progress Notes (Signed)
Subjective:    Patient ID: SAMANVITHA GERMANY, female    DOB: 05/03/1950, 69 y.o.   MRN: [id number] No chief complaint on file.   The patient presents today after being sent by her dialysis center with concern for steal syndrome.  The patient notes that the symptoms typically only happen when excess volume is being pulled during dialysis.  The left hand numbness is not consistent.  The patient notes that days when no access volume is pulled she does not have the symptoms.  The patient also notes that sometimes the symptoms not only occur in her hands but also her leg at her stomach when excess volume is pulled.  Otherwise, the patient denies any issues with her access.  She denies any fever, chills, nausea, vomiting or diarrhea.  She denies any extended bleeding a whole times.  Today noninvasive studies show a flow volume of 20-32.  Compared to the previous exam there are improved velocities at the anastomosis site.  There is narrowing distal to the anastomosis site at 0.21 cm with a velocity of 553 however this was also seen on the previous exam.  There is no evidence of steal post compression seen on exam.   Review of Systems  Musculoskeletal: Positive for myalgias.  All other systems reviewed and are negative.      Objective:   Physical Exam Vitals reviewed.  HENT:     Head: Normocephalic.  Cardiovascular:     Rate and Rhythm: Normal rate and regular rhythm.     Pulses: Normal pulses.          Radial pulses are 2+ on the right side and 2+ on the left side.     Heart sounds: Normal heart sounds.     Arteriovenous access: left arteriovenous access is present.    Comments: Left brachiocephalic AV fistula with good thrill and bruit Pulmonary:     Effort: Pulmonary effort is normal.     Breath sounds: Normal breath sounds.  Neurological:     Mental Status: She is alert and oriented to person, place, and time.  Psychiatric:        Mood and Affect: Mood normal.        Behavior:  Behavior normal.        Thought Content: Thought content normal.        Judgment: Judgment normal.     BP (!) 147/78   Pulse 96   Ht 5\' 7"  (1.702 m)   Wt 129 lb (58.5 kg)   BMI 20.20 kg/m   Past Medical History:  Diagnosis Date  . Chronic kidney disease   . Edema    RLE  . Hepatitis    Hep C  . History of blood transfusion   . Hypertension   . Wears dentures     Social History   Socioeconomic History  . Marital status: Single    Spouse name: Not on file  . Number of children: 0  . Years of education: Not on file  . Highest education level: Not on file  Occupational History  . Occupation: on Fish farm manager   Tobacco Use  . Smoking status: Former Smoker    Types: Cigarettes  . Smokeless tobacco: Never Used  Vaping Use  . Vaping Use: Never used  Substance and Sexual Activity  . Alcohol use: Not Currently    Alcohol/week: 6.0 standard drinks    Types: 6 Cans of beer per week  . Drug use: Not Currently  Types: Marijuana    Comment: occasional   . Sexual activity: Not Currently  Other Topics Concern  . Not on file  Social History Narrative   Lives at home in private residence with fiance' [place] Strain:   . Difficulty of Paying Living Expenses:   Food Insecurity:   . Worried About Charity fundraiser in the Last Year:   . Arboriculturist in the Last Year:   Transportation Needs:   . Film/video editor (Medical):   Marland Kitchen Lack of Transportation (Non-Medical):   Physical Activity:   . Days of Exercise per Week:   . Minutes of Exercise per Session:   Stress:   . Feeling of Stress :   Social Connections:   . Frequency of Communication with Friends and Family:   . Frequency of Social Gatherings with Friends and Family:   . Attends Religious Services:   . Active Member of Clubs or Organizations:   . Attends Archivist Meetings:   Marland Kitchen Marital Status:   Intimate Partner Violence:   . Fear of  Current or Ex-Partner:   . Emotionally Abused:   Marland Kitchen Physically Abused:   . Sexually Abused:     Past Surgical History:  Procedure Laterality Date  . A/V FISTULAGRAM Left 04/11/2019   Procedure: A/V FISTULAGRAM;  Surgeon: Katha Cabal, MD;  Location: [facility];  Service: Cardiovascular;  Laterality: Left;  . A/V FISTULAGRAM Left 05/01/2019   Procedure: A/V FISTULAGRAM;  Surgeon: Algernon Huxley, MD;  Location: [facility];  Service: Cardiovascular;  Laterality: Left;  . AV FISTULA PLACEMENT Left 06/16/2018   Procedure: Left Arm ARTERIOVENOUS (AV) FISTULA CREATION;  Surgeon: Marty Heck, MD;  Location: [facility];  Service: Vascular;  Laterality: Left;  Marland Kitchen MULTIPLE TOOTH EXTRACTIONS    . MYOMECTOMY      Family History  Problem Relation Age of Onset  . Diabetes Mother   . Hypertension Mother   . Bone cancer Father   . Kidney cancer Maternal Grandmother   . Stroke Brother   . Diabetes Brother     No Known Allergies     Assessment & Plan:   1. ESRD (end stage renal disease) ([place]) Based upon the patient's symptoms in addition to her noninvasive studies, it is likely that the pain and discomfort that the patient is having is not related to steal symptoms, instead due to fluid imbalances.  The symptoms typically only happen when excess volume is pulled, in addition the pain and cramping happens in her stomach as well as in her lower extremities.  We will continue to have the patient follow-up in 6 months as regularly scheduled.  However, patient is advised to contact us sooner if the pain becomes more consistent even with out access following the improved.  2. Essential hypertension Continue antihypertensive medications as already ordered, these medications have been reviewed and there are no changes at this time.    Current Outpatient Medications on File Prior to Visit  Medication Sig Dispense Refill  . acetaminophen (TYLENOL) 500 MG tablet Take 1,000 mg by  mouth every 6 (six) hours as needed for moderate pain or headache.    . multivitamin (RENA-VIT) TABS tablet Take 1 tablet by mouth daily.    . VELPHORO 500 MG chewable tablet Chew 1,000 mg by mouth See admin instructions. Take 1000 mg with meals and 500 mg with snacks    .  midodrine (PROAMATINE) 10 MG tablet Take 10 mg by mouth in the morning and at bedtime. (Patient not taking: Reported on 07/14/2019)     No current facility-administered medications on file prior to visit.    There are no Patient Instructions on file for this visit. No follow-ups on file.   Kris Hartmann, NP

## 2019-07-18 DIAGNOSIS — N186 End stage renal disease: Secondary | ICD-10-CM | POA: Diagnosis not present

## 2019-07-18 DIAGNOSIS — D631 Anemia in chronic kidney disease: Secondary | ICD-10-CM | POA: Diagnosis not present

## 2019-07-18 DIAGNOSIS — N2581 Secondary hyperparathyroidism of renal origin: Secondary | ICD-10-CM | POA: Diagnosis not present

## 2019-07-18 DIAGNOSIS — D689 Coagulation defect, unspecified: Secondary | ICD-10-CM | POA: Diagnosis not present

## 2019-07-18 DIAGNOSIS — Z992 Dependence on renal dialysis: Secondary | ICD-10-CM | POA: Diagnosis not present

## 2019-07-18 DIAGNOSIS — Z23 Encounter for immunization: Secondary | ICD-10-CM | POA: Diagnosis not present

## 2019-07-20 DIAGNOSIS — Z23 Encounter for immunization: Secondary | ICD-10-CM | POA: Diagnosis not present

## 2019-07-20 DIAGNOSIS — D631 Anemia in chronic kidney disease: Secondary | ICD-10-CM | POA: Diagnosis not present

## 2019-07-20 DIAGNOSIS — D689 Coagulation defect, unspecified: Secondary | ICD-10-CM | POA: Diagnosis not present

## 2019-07-20 DIAGNOSIS — Z992 Dependence on renal dialysis: Secondary | ICD-10-CM | POA: Diagnosis not present

## 2019-07-20 DIAGNOSIS — N2581 Secondary hyperparathyroidism of renal origin: Secondary | ICD-10-CM | POA: Diagnosis not present

## 2019-07-20 DIAGNOSIS — N186 End stage renal disease: Secondary | ICD-10-CM | POA: Diagnosis not present

## 2019-07-22 DIAGNOSIS — D689 Coagulation defect, unspecified: Secondary | ICD-10-CM | POA: Diagnosis not present

## 2019-07-22 DIAGNOSIS — D631 Anemia in chronic kidney disease: Secondary | ICD-10-CM | POA: Diagnosis not present

## 2019-07-22 DIAGNOSIS — N2581 Secondary hyperparathyroidism of renal origin: Secondary | ICD-10-CM | POA: Diagnosis not present

## 2019-07-22 DIAGNOSIS — Z992 Dependence on renal dialysis: Secondary | ICD-10-CM | POA: Diagnosis not present

## 2019-07-22 DIAGNOSIS — Z23 Encounter for immunization: Secondary | ICD-10-CM | POA: Diagnosis not present

## 2019-07-22 DIAGNOSIS — N186 End stage renal disease: Secondary | ICD-10-CM | POA: Diagnosis not present

## 2019-07-25 DIAGNOSIS — D689 Coagulation defect, unspecified: Secondary | ICD-10-CM | POA: Diagnosis not present

## 2019-07-25 DIAGNOSIS — N186 End stage renal disease: Secondary | ICD-10-CM | POA: Diagnosis not present

## 2019-07-25 DIAGNOSIS — D631 Anemia in chronic kidney disease: Secondary | ICD-10-CM | POA: Diagnosis not present

## 2019-07-25 DIAGNOSIS — N2581 Secondary hyperparathyroidism of renal origin: Secondary | ICD-10-CM | POA: Diagnosis not present

## 2019-07-25 DIAGNOSIS — Z23 Encounter for immunization: Secondary | ICD-10-CM | POA: Diagnosis not present

## 2019-07-25 DIAGNOSIS — Z992 Dependence on renal dialysis: Secondary | ICD-10-CM | POA: Diagnosis not present

## 2019-07-27 DIAGNOSIS — N186 End stage renal disease: Secondary | ICD-10-CM | POA: Diagnosis not present

## 2019-07-27 DIAGNOSIS — D689 Coagulation defect, unspecified: Secondary | ICD-10-CM | POA: Diagnosis not present

## 2019-07-27 DIAGNOSIS — Z992 Dependence on renal dialysis: Secondary | ICD-10-CM | POA: Diagnosis not present

## 2019-07-27 DIAGNOSIS — Z23 Encounter for immunization: Secondary | ICD-10-CM | POA: Diagnosis not present

## 2019-07-27 DIAGNOSIS — D631 Anemia in chronic kidney disease: Secondary | ICD-10-CM | POA: Diagnosis not present

## 2019-07-27 DIAGNOSIS — N2581 Secondary hyperparathyroidism of renal origin: Secondary | ICD-10-CM | POA: Diagnosis not present

## 2019-07-29 DIAGNOSIS — N2581 Secondary hyperparathyroidism of renal origin: Secondary | ICD-10-CM | POA: Diagnosis not present

## 2019-07-29 DIAGNOSIS — D631 Anemia in chronic kidney disease: Secondary | ICD-10-CM | POA: Diagnosis not present

## 2019-07-29 DIAGNOSIS — Z992 Dependence on renal dialysis: Secondary | ICD-10-CM | POA: Diagnosis not present

## 2019-07-29 DIAGNOSIS — D689 Coagulation defect, unspecified: Secondary | ICD-10-CM | POA: Diagnosis not present

## 2019-07-29 DIAGNOSIS — Z23 Encounter for immunization: Secondary | ICD-10-CM | POA: Diagnosis not present

## 2019-07-29 DIAGNOSIS — N186 End stage renal disease: Secondary | ICD-10-CM | POA: Diagnosis not present

## 2019-07-31 ENCOUNTER — Ambulatory Visit (INDEPENDENT_AMBULATORY_CARE_PROVIDER_SITE_OTHER): Payer: Medicare Other | Admitting: Internal Medicine

## 2019-07-31 ENCOUNTER — Other Ambulatory Visit (HOSPITAL_COMMUNITY)
Admission: RE | Admit: 2019-07-31 | Discharge: 2019-07-31 | Disposition: A | Discharge status: Home / Self Care | Payer: Medicare Other | Source: Ambulatory Visit | Attending: Internal Medicine | Admitting: Internal Medicine

## 2019-07-31 ENCOUNTER — Telehealth: Payer: Self-pay

## 2019-07-31 ENCOUNTER — Encounter: Payer: Self-pay | Admitting: Internal Medicine

## 2019-07-31 ENCOUNTER — Other Ambulatory Visit: Payer: Self-pay

## 2019-07-31 VITALS — BP 136/60 | HR 99 | Temp 97.7°F | Ht 66.25 in | Wt 129.0 lb

## 2019-07-31 DIAGNOSIS — Z13220 Encounter for screening for lipoid disorders: Secondary | ICD-10-CM | POA: Diagnosis not present

## 2019-07-31 DIAGNOSIS — Z1211 Encounter for screening for malignant neoplasm of colon: Secondary | ICD-10-CM

## 2019-07-31 DIAGNOSIS — Z780 Asymptomatic menopausal state: Secondary | ICD-10-CM

## 2019-07-31 DIAGNOSIS — Z0000 Encounter for general adult medical examination without abnormal findings: Secondary | ICD-10-CM

## 2019-07-31 DIAGNOSIS — Z23 Encounter for immunization: Secondary | ICD-10-CM | POA: Diagnosis not present

## 2019-07-31 DIAGNOSIS — I770 Arteriovenous fistula, acquired: Secondary | ICD-10-CM

## 2019-07-31 DIAGNOSIS — B182 Chronic viral hepatitis C: Secondary | ICD-10-CM | POA: Diagnosis not present

## 2019-07-31 DIAGNOSIS — Z124 Encounter for screening for malignant neoplasm of cervix: Secondary | ICD-10-CM

## 2019-07-31 DIAGNOSIS — Z1231 Encounter for screening mammogram for malignant neoplasm of breast: Secondary | ICD-10-CM

## 2019-07-31 DIAGNOSIS — N185 Chronic kidney disease, stage 5: Secondary | ICD-10-CM

## 2019-07-31 DIAGNOSIS — I10 Essential (primary) hypertension: Secondary | ICD-10-CM | POA: Diagnosis not present

## 2019-07-31 DIAGNOSIS — N2581 Secondary hyperparathyroidism of renal origin: Secondary | ICD-10-CM | POA: Diagnosis not present

## 2019-07-31 DIAGNOSIS — D631 Anemia in chronic kidney disease: Secondary | ICD-10-CM | POA: Diagnosis not present

## 2019-07-31 DIAGNOSIS — N186 End stage renal disease: Secondary | ICD-10-CM | POA: Diagnosis not present

## 2019-07-31 DIAGNOSIS — E7800 Pure hypercholesterolemia, unspecified: Secondary | ICD-10-CM

## 2019-07-31 LAB — IBC PANEL
Iron: 84 ug/dL (ref 42–145)
Saturation Ratios: 37 % (ref 20.0–50.0)
Transferrin: 162 mg/dL — ABNORMAL LOW (ref 212.0–360.0)

## 2019-07-31 LAB — COMPREHENSIVE METABOLIC PANEL
ALT: 11 U/L (ref 0–35)
AST: 14 U/L (ref 0–37)
Albumin: 4.1 g/dL (ref 3.5–5.2)
Alkaline Phosphatase: 112 U/L (ref 39–117)
BUN: 40 mg/dL — ABNORMAL HIGH (ref 6–23)
CO2: 31 mEq/L (ref 19–32)
Calcium: 8.7 mg/dL (ref 8.4–10.5)
Chloride: 95 mEq/L — ABNORMAL LOW (ref 96–112)
Creatinine, Ser: 8.94 mg/dL (ref 0.40–1.20)
GFR: 5.32 mL/min — CL (ref >60.00)
Glucose, Bld: 168 mg/dL — ABNORMAL HIGH (ref 70–99)
Potassium: 4 mEq/L (ref 3.5–5.1)
Sodium: 139 mEq/L (ref 135–145)
Total Bilirubin: 0.6 mg/dL (ref 0.2–1.2)
Total Protein: 7.2 g/dL (ref 6.0–8.3)

## 2019-07-31 LAB — LIPID PANEL
Cholesterol: 254 mg/dL — ABNORMAL HIGH (ref 0–200)
HDL: 44.1 mg/dL (ref >39.00)
LDL Cholesterol: 188 mg/dL — ABNORMAL HIGH (ref 0–99)
NonHDL: 210.18
Total CHOL/HDL Ratio: 6
Triglycerides: 110 mg/dL (ref 0.0–149.0)
VLDL: 22 mg/dL (ref 0.0–40.0)

## 2019-07-31 LAB — CBC
HCT: 30.9 % — ABNORMAL LOW (ref 36.0–46.0)
Hemoglobin: 10.3 g/dL — ABNORMAL LOW (ref 12.0–15.0)
MCHC: 33.2 g/dL (ref 30.0–36.0)
MCV: 87.7 fl (ref 78.0–100.0)
Platelets: 247 10*3/uL (ref 150.0–400.0)
RBC: 3.52 Mil/uL — ABNORMAL LOW (ref 3.87–5.11)
RDW: 18 % — ABNORMAL HIGH (ref 11.5–15.5)
WBC: 8.3 10*3/uL (ref 4.0–10.5)

## 2019-07-31 LAB — VITAMIN D 25 HYDROXY (VIT D DEFICIENCY, FRACTURES): VITD: 28.16 ng/mL — ABNORMAL LOW (ref 30.00–100.00)

## 2019-07-31 NOTE — Patient Instructions (Signed)
[facility] Maintenance for Postmenopausal Women La menopausia es un proceso normal en el cual la capacidad de quedar embarazada llega a su fin. Este proceso ocurre lentamente a lo largo de un perodo de muchos meses o aos; por lo general, entre los 59 y los 58aos. La menopausia es completa cuando no se ha tenido el perodo menstrual por 78meses. Es importante hablar con el mdico sobre algunas de las enfermedades ms comunes que afectan a las mujeres despus de la menopausia (mujeres posmenopusicas). Estas incluyen la enfermedad cardaca, el cncer y la prdida sea (osteoporosis). Adoptar un estilo de vida saludable y recibir atencin preventiva pueden ayudar a promover la salud y Musician. Las medidas que tome tambin pueden reducir las probabilidades de Actor algunas de estas enfermedades frecuentes. Qu debo saber acerca de la menopausia? Durante la menopausia, puede tener una serie de sntomas, por ejemplo:  Acaloramiento. Estos pueden ser moderados o intensos.  Sudoracin nocturna.  Disminucin del deseo sexual.  Cambios en el estado de nimo.  Dolores de [place].  Cansancio.  Irritabilidad.  Problemas de memoria.  Insomnio. Tratar o no estos sntomas es una decisin que se toma con el mdico. Necesito terapia de reemplazo hormonal?  La terapia de reemplazo hormonal es eficaz para tratar los sntomas causados por la menopausia, como los acaloramientos y las sudoraciones nocturnas.  La reposicin hormonal conlleva ciertos riesgos, especialmente a medida que una mujer envejece. Si est pensando en usar estrgeno o estrgeno con progestina, analice los beneficios y los riesgos con el mdico. Cul es mi riesgo de sufrir enfermedad cardaca y accidente cerebrovascular? A medida que se envejece, aumenta el riesgo de enfermedad cardaca, infarto de miocardio y accidente cerebrovascular. Una de las causas puede ser un  cambio en las hormonas del cuerpo durante la menopausia. Esto puede afectar la forma en que el organismo procesa las [place], los triglicridos y el colesterol de su dieta. El infarto de miocardio y el accidente cerebrovascular son emergencias mdicas. Hay muchas cosas que se pueden hacer para ayudar a prevenir la enfermedad cardaca y el accidente cerebrovascular. Contrlese la presin arterial  La hipertensin arterial causa enfermedades cardacas y [place] el riesgo de accidente cerebrovascular. Es ms probable que esto se manifieste en las personas que tienen lecturas de presin arterial alta, tienen ascendencia africana o tienen sobrepeso.  Hgase controlar la presin arterial: ? Cada 3 a 5 aos si tiene entre 18 y 51 aos. ? Todos los aos si es mayor de [place]. Consuma una dieta saludable   Consuma una dieta que incluya muchas verduras, frutas, productos lcteos con bajo contenido de [place] y Advertising account planner.  No consuma muchos alimentos ricos en grasas slidas, azcares agregados o sodio. Haga ejercicio con regularidad Haga ejercicio con regularidad. Esta es una de las prcticas ms importantes que puede hacer por su salud. La mayora de los adultos deben seguir estas pautas:  Intente realizar al menos 1103minutos de actividad fsica por semana. El ejercicio debe aumentar la frecuencia cardaca y Nature conservation officer transpirar (ejercicio de intensidad moderada).  Intente hacer ejercicios de elongacin por lo menos dos veces por semana. Agrguelos al plan de ejercicio de intensidad moderada.  Pasar menos tiempo sentados. Incluso la actividad fsica ligera puede ser beneficiosa. Otros consejos  Trabaje con su mdico para Science writer o Theatre manager un peso saludable.  No consuma ningn producto que contenga nicotina o tabaco, como cigarrillos, cigarrillos electrnicos y tabaco de Higher education careers adviser. Si necesita ayuda para dejar de fumar, consulte  al mdico.  Conozca sus cifras. Pdale al mdico que le controle el  colesterol y el nivel sanguneo de azcar en la sangre (glucosa). Siga hacindose anlisis de [employer] se lo haya indicado el mdico. Necesito realizarme pruebas de deteccin del cncer? Segn su historia clnica y sus antecedentes familiares, es posible que deba realizarse pruebas de deteccin del cncer en diferentes etapas de la vida. Esto puede incluir pruebas de deteccin de lo siguiente:  Cncer de mama.  Cncer de cuello uterino.  Cncer de pulmn.  Cncer colorrectal. Cul es mi riesgo de tener osteoporosis? Despus de la menopausia, puede correr un riesgo ms alto de tener osteoporosis. La osteoporosis es una afeccin en la cual la destruccin de la masa sea ocurre con mayor rapidez que su formacin. Para ayudar a prevenir esta afeccin o las fracturas seas que pueden ocurrir a causa de [place], usted puede tomar las siguientes medidas:  Si tiene entre 19 y 50aos, tome como mnimo 1000mg  de calcio y 600mg  de vitaminaD por Training and development officer.  Si es mayor de 50aos pero menor de 70aos, tome como mnimo 1200mg  de calcio y 600mg  de vitaminaD por Training and development officer.  Si es mayor de 70aos, tome como mnimo 1200mg  de calcio y 800mg  de vitaminaD por Training and development officer. Fumar y beber alcohol en exceso aumentan el riesgo de osteoporosis. Consuma alimentos ricos en calcio y vitaminaD, y haga ejercicios con soporte de peso varias veces a la semana, como se lo haya indicado el mdico. De qu manera la menopausia afecta mi salud mental? La depresin puede presentarse a cualquier edad, pero es ms frecuente a medida que una persona envejece. Los sntomas comunes de depresin incluyen lo siguiente:  Desnimo o tristeza.  Cambios en los patrones de sueo.  Cambios en el apetito o en los hbitos de alimentacin.  Sensacin de falta general de motivacin o placer al [employer] actividades que sola disfrutar.  Crisis frecuentes de llanto. Hable con el mdico si cree que tiene depresin. Instrucciones  generales Visite a su mdico para hacerse exmenes de bienestar peridicos y aplicarse vacunas. Puede incluir:  Programar exmenes peridicos dentales, de la salud y de Public librarian.  Recibir y [employer]. Estos incluyen los siguientes: ? Human resources officer. Aplquese esta vacuna todos los aos antes de que comience la temporada de gripe. ? Vacuna contra la neumona. ? Vacuna contra el herpes. ? Vacuna contra el ttanos, la difteria y la tos Dyann Ruddle (Tdap). El mdico tambin puede recomendarle que se aplique otras vacunas. Notifique a su mdico si alguna vez ha sido vctima de abuso o si no se siente seguro en su hogar. Resumen  La menopausia es un proceso normal en el cual la capacidad de quedar embarazada llega a su fin.  Esta condicin causa acaloramientos, sudoraciones nocturnas, disminucin del inters en el sexo, cambios en el estado de nimo, dolores de [place] o falta de sueo.  El tratamiento de esta afeccin puede incluir una terapia de reemplazo hormonal.  Tome medidas para mantenerse 50, entre ellas, hacer ejercicio con regularidad, seguir una dieta saludable, controlar su peso y medirse la presin arterial y los niveles de Dispensing optician.  Hgase pruebas para Film/video editor y depresin. Asegrese de estar al da con todas las vacunas. Esta informacin no tiene Marine scientist el consejo del mdico. Asegrese de hacerle al mdico cualquier pregunta que tenga. Document Revised: 02/09/2018 Document Reviewed: 02/09/2018 Elsevier Patient Education  [place].

## 2019-07-31 NOTE — Progress Notes (Signed)
HPI:  Pt presents to the clinic today for her initial Medicare Wellness Exam. She is also due to follow up chronic conditions.  HTN: Her BP today is 136/60. She is not taking any antihypertensive medication at this time. ECG from 04/2019 reviewed.  Hx of Hep C: No intervention. She is not following with the [facility].  Anemia of CKD: Her last Hemoglobin was 12, 08/2018. She is not taking any oral iron but does get iron infusions at dialysis.  ESRD: Her last GFR was 6. She gets dialysis 3 x week. She follows with nephrology. She is currently waiting to be listed at [facility] for a kidney transplant.  Hyperparthyroidism: She is unsure if she is undergoing treatment for this.   Past Medical History:  Diagnosis Date  . Chronic kidney disease   . Edema    RLE  . Hepatitis    Hep C  . History of blood transfusion   . Hypertension   . Wears dentures     Current Outpatient Medications  Medication Sig Dispense Refill  . acetaminophen (TYLENOL) 500 MG tablet Take 1,000 mg by mouth every 6 (six) hours as needed for moderate pain or headache.    . midodrine (PROAMATINE) 10 MG tablet Take 10 mg by mouth in the morning and at bedtime. (Patient not taking: Reported on 07/14/2019)    . multivitamin (RENA-VIT) TABS tablet Take 1 tablet by mouth daily.    . VELPHORO 500 MG chewable tablet Chew 1,000 mg by mouth See admin instructions. Take 1000 mg with meals and 500 mg with snacks     No current facility-administered medications for this visit.    No Known Allergies  Family History  Problem Relation Age of Onset  . Diabetes Mother   . Hypertension Mother   . Bone cancer Father   . Kidney cancer Maternal Grandmother   . Stroke Brother   . Diabetes Brother     Social History   Socioeconomic History  . Marital status: Single    Spouse name: Not on file  . Number of children: 0  . Years of education: Not on file  . Highest education level: Not on file  Occupational History   . Occupation: on Fish farm manager   Tobacco Use  . Smoking status: Former Smoker    Types: Cigarettes  . Smokeless tobacco: Never Used  Vaping Use  . Vaping Use: Never used  Substance and Sexual Activity  . Alcohol use: Not Currently    Alcohol/week: 6.0 standard drinks    Types: 6 Cans of beer per week  . Drug use: Not Currently    Types: Marijuana    Comment: occasional   . Sexual activity: Not Currently  Other Topics Concern  . Not on file  Social History Narrative   Lives at home in private residence with fiance' [place] Strain:   . Difficulty of Paying Living Expenses:   Food Insecurity:   . Worried About Charity fundraiser in the Last Year:   . Arboriculturist in the Last Year:   Transportation Needs:   . Film/video editor (Medical):   Marland Kitchen Lack of Transportation (Non-Medical):   Physical Activity:   . Days of Exercise per Week:   . Minutes of Exercise per Session:   Stress:   . Feeling of Stress :   Social Connections:   . Frequency of Communication with Friends and  Family:   . Frequency of Social Gatherings with Friends and Family:   . Attends Religious Services:   . Active Member of Clubs or Organizations:   . Attends Archivist Meetings:   Marland Kitchen Marital Status:   Intimate Partner Violence:   . Fear of Current or Ex-Partner:   . Emotionally Abused:   Marland Kitchen Physically Abused:   . Sexually Abused:     Hospitiliaztions: 04/2019, AV Fistula  Health Maintenance:    Flu: 10/2018  Tetanus: unsure  Pneumovax: never  Prevnar: never  Zostavax: never  Shingrix: never  Covid: 04/2019, 05/2019  Mammogram: > 5 years ago  Pap Smear: > 5 years ago  Bone Density: never  Colon Screening: never  Eye Doctor: annually  Dental Exam: as needed   Providers:   PCP: Webb Silversmith, NP  Vascular Surgery: Dr. Lucky Cowboy  Cardiology: Dr. Ronalee Belts  Nephrology: Dr. Posey Pronto  Rheumatologist: Dr. Raj Janus   I have  personally reviewed and have noted:  1. The patient's medical and social history 2. Their use of alcohol, tobacco or illicit drugs 3. Their current medications and supplements 4. The patient's functional ability including ADL's, fall risks, home safety risks and hearing or visual impairment. 5. Diet and physical activities 6. Evidence for depression or mood disorder  Subjective:   Review of Systems:   Constitutional: Denies fever, malaise, fatigue, headache or abrupt weight changes.  HEENT: Denies eye pain, eye redness, ear pain, ringing in the ears, wax buildup, runny nose, nasal congestion, bloody nose, or sore throat. Respiratory: Denies difficulty breathing, shortness of breath, cough or sputum production.   Cardiovascular: Denies chest pain, chest tightness, palpitations or swelling in the hands or feet.  Gastrointestinal: Denies abdominal pain, bloating, constipation, diarrhea or blood in the stool.  GU: Denies urgency, frequency, pain with urination, burning sensation, blood in urine, odor or discharge. Musculoskeletal: Denies decrease in range of motion, difficulty with gait, muscle pain or joint pain and swelling.  Skin: Denies redness, rashes, lesions or ulcercations.  Neurological: Denies dizziness, difficulty with memory, difficulty with speech or problems with balance and coordination.  Psych: Denies anxiety, depression, SI/HI.  No other specific complaints in a complete review of systems (except as listed in HPI above).  Objective:  PE:   BP 136/60   Pulse 99   Temp 97.7 F (36.5 C) (Temporal)   Ht 5' 6.25" (1.683 m)   Wt 129 lb (58.5 kg)   SpO2 98%   BMI 20.66 kg/m   Wt Readings from Last 3 Encounters:  07/14/19 129 lb (58.5 kg)  06/14/19 128 lb (58.1 kg)  05/01/19 125 lb (56.7 kg)    General: Appears her stated age, well developed, well nourished in NAD. Skin: Warm, dry and intact. No ulcerations noted. HEENT: Head: normal shape and size; Eyes: sclera  white, no icterus, conjunctiva pink, PERRLA and EOMs intact;  Neck: Neck supple, trachea midline. No masses, lumps or thyromegaly present.  Cardiovascular: Normal rate and rhythm. S1,S2 noted.  No murmur, rubs or gallops noted. No JVD or BLE edema. No carotid bruits noted. AV fistula left upper extremity, + thrill, + bruit. Pulmonary/Chest: Normal effort and positive vesicular breath sounds. No respiratory distress. No wheezes, rales or ronchi noted.  Abdomen: Soft and nontender. Normal bowel sounds. No distention or masses noted. Liver, spleen and kidneys non palpable. Pelvic: Normal female anatomy. Cervix without changes. No vaginal discharge or odor noted. No CMT. Adnexa nonpalpable.  Musculoskeletal: Strength 5/5 BUE/BLE. No difficulty with gait.  Neurological: Alert and oriented. Cranial nerves II-XII grossly intact. Coordination normal.  Psychiatric: Mood and affect normal. Behavior is normal. Judgment and thought content normal.    BMET    Component Value Date/Time   NA 138 06/23/2018 0000   K 4.9 06/23/2018 0000   CL 108 03/09/2018 1650   CO2 19 03/09/2018 1650   GLUCOSE 95 06/16/2018 1103   BUN 61 06/23/2018 0000   CREATININE 7.13 06/23/2018 0000   CALCIUM 8.8 06/23/2018 0000   GFRNONAA 9 (L) 02/27/2018 0533   GFRAA 6 06/23/2018 0000    Lipid Panel     Component Value Date/Time   CHOL 154 02/25/2018 1233   TRIG 65.0 02/25/2018 1233   HDL 67.40 02/25/2018 1233   CHOLHDL 2 02/25/2018 1233   VLDL 13.0 02/25/2018 1233   LDLCALC 74 02/25/2018 1233    CBC    Component Value Date/Time   WBC 5.4 03/09/2018 1650   RBC 3.52 (L) 03/09/2018 1650   HGB 12.0 08/18/2018 1344   HCT 27 (A) 06/23/2018 0000   PLT 221.0 03/09/2018 1650   MCV 88.4 03/09/2018 1650   MCH 28.4 02/27/2018 0533   MCHC 33.2 03/09/2018 1650   RDW 14.6 03/09/2018 1650   LYMPHSABS 2.3 02/26/2018 0930   MONOABS 0.3 02/26/2018 0930   EOSABS 0.2 02/26/2018 0930   BASOSABS 0.0 02/26/2018 0930    Hgb  A1C No results found for: HGBA1C    Assessment and Plan:   Medicare Annual Wellness Visit:  Diet: She does eat meat. She consumes fruits and veggies. She does eat some fried foods. She drinks mostly water, ginger ale. Physical activity: Walking Depression/mood screen: Negative, PHQ 9 score of 0 Hearing: Intact to whispered voice Visual acuity: Grossly normal, performs annual eye exam  ADLs: Capable Fall risk: None Home safety: Good Cognitive evaluation: Intact to orientation, naming, recall and repetition EOL planning: No adv directives, full code/ I agree  Preventative Medicine: Encouraged her to et a flu shot in the fall. She declines tetanus. Prevnar today. Will get pneumovax in 1 year. She will consider Shingrix. Covid UTD. Mammogram, bone density and Cologuard ordered. Pap smear today- she declines STD screening. Encouraged her to consume a balanced diet and exercise regimen. Advised her to see an eye doctor and dentist annually. Will check CBC, CMET, Lipid profile, IBC panel and Vit D today.   Next appointment: 1 year, Medicare Wellness Exam   Webb Silversmith, NP This visit occurred during the SARS-CoV-2 public health emergency.  Safety protocols were in place, including screening questions prior to the visit, additional usage of staff PPE, and extensive cleaning of exam room while observing appropriate contact time as indicated for disinfecting solutions.

## 2019-07-31 NOTE — Assessment & Plan Note (Signed)
No medications Will monitor

## 2019-07-31 NOTE — Telephone Encounter (Signed)
[facility] called with critical results @ 1620  Creatinine 8.94 GFR 5.32

## 2019-07-31 NOTE — Assessment & Plan Note (Signed)
CMET today She will continue to follow with nephrology

## 2019-07-31 NOTE — Assessment & Plan Note (Signed)
CMET today 

## 2019-07-31 NOTE — Addendum Note (Signed)
Addended by: Lurlean Nanny on: 07/31/2019 04:46 PM   Modules accepted: Orders

## 2019-07-31 NOTE — Assessment & Plan Note (Signed)
Working properly Will monitor

## 2019-07-31 NOTE — Assessment & Plan Note (Signed)
CBC, CMET and IBC panel today She will continue iron infusions prn

## 2019-07-31 NOTE — Telephone Encounter (Signed)
Noted, known ESRD

## 2019-08-01 DIAGNOSIS — D689 Coagulation defect, unspecified: Secondary | ICD-10-CM | POA: Diagnosis not present

## 2019-08-01 DIAGNOSIS — Z992 Dependence on renal dialysis: Secondary | ICD-10-CM | POA: Diagnosis not present

## 2019-08-01 DIAGNOSIS — N186 End stage renal disease: Secondary | ICD-10-CM | POA: Diagnosis not present

## 2019-08-01 DIAGNOSIS — D631 Anemia in chronic kidney disease: Secondary | ICD-10-CM | POA: Diagnosis not present

## 2019-08-01 DIAGNOSIS — N2581 Secondary hyperparathyroidism of renal origin: Secondary | ICD-10-CM | POA: Diagnosis not present

## 2019-08-01 DIAGNOSIS — Z23 Encounter for immunization: Secondary | ICD-10-CM | POA: Diagnosis not present

## 2019-08-02 DIAGNOSIS — Z992 Dependence on renal dialysis: Secondary | ICD-10-CM | POA: Diagnosis not present

## 2019-08-02 DIAGNOSIS — N186 End stage renal disease: Secondary | ICD-10-CM | POA: Diagnosis not present

## 2019-08-02 DIAGNOSIS — I129 Hypertensive chronic kidney disease with stage 1 through stage 4 chronic kidney disease, or unspecified chronic kidney disease: Secondary | ICD-10-CM | POA: Diagnosis not present

## 2019-08-02 LAB — CYTOLOGY - PAP: Diagnosis: NEGATIVE

## 2019-08-02 MED ORDER — FLUCONAZOLE 150 MG PO TABS
150.0000 mg | ORAL_TABLET | Freq: Once | ORAL | 0 refills | Status: AC
Start: 2019-08-02 — End: 2019-08-02

## 2019-08-02 MED ORDER — ATORVASTATIN CALCIUM 10 MG PO TABS: 10.0000 mg | ORAL_TABLET | Freq: Every day | ORAL | 2 refills | Status: DC

## 2019-08-02 NOTE — Addendum Note (Signed)
Addended by: Lurlean Nanny on: 08/02/2019 04:46 PM   Modules accepted: Orders

## 2019-08-02 NOTE — Addendum Note (Signed)
Addended by: [name] on: 08/02/2019 01:47 PM   Modules accepted: Orders

## 2019-08-03 DIAGNOSIS — Z992 Dependence on renal dialysis: Secondary | ICD-10-CM | POA: Diagnosis not present

## 2019-08-03 DIAGNOSIS — Z23 Encounter for immunization: Secondary | ICD-10-CM | POA: Diagnosis not present

## 2019-08-03 DIAGNOSIS — D631 Anemia in chronic kidney disease: Secondary | ICD-10-CM | POA: Diagnosis not present

## 2019-08-03 DIAGNOSIS — N186 End stage renal disease: Secondary | ICD-10-CM | POA: Diagnosis not present

## 2019-08-03 DIAGNOSIS — D689 Coagulation defect, unspecified: Secondary | ICD-10-CM | POA: Diagnosis not present

## 2019-08-03 DIAGNOSIS — N2581 Secondary hyperparathyroidism of renal origin: Secondary | ICD-10-CM | POA: Diagnosis not present

## 2019-08-04 ENCOUNTER — Telehealth: Payer: Self-pay | Admitting: Internal Medicine

## 2019-08-04 DIAGNOSIS — Z1211 Encounter for screening for malignant neoplasm of colon: Secondary | ICD-10-CM

## 2019-08-04 NOTE — Telephone Encounter (Signed)
Patient said Cologuard was ordered.  When patient spoke to Dr.Patel,dialysis, he insisted she get a Colonoscopy. Patient's requesting to be referred for a Colonoscopy. Patient wants to go to [facility].  Patient can go on Monday, Wednesday, or Friday in the AM.

## 2019-08-05 DIAGNOSIS — Z992 Dependence on renal dialysis: Secondary | ICD-10-CM | POA: Diagnosis not present

## 2019-08-05 DIAGNOSIS — D689 Coagulation defect, unspecified: Secondary | ICD-10-CM | POA: Diagnosis not present

## 2019-08-05 DIAGNOSIS — Z23 Encounter for immunization: Secondary | ICD-10-CM | POA: Diagnosis not present

## 2019-08-05 DIAGNOSIS — D631 Anemia in chronic kidney disease: Secondary | ICD-10-CM | POA: Diagnosis not present

## 2019-08-05 DIAGNOSIS — N2581 Secondary hyperparathyroidism of renal origin: Secondary | ICD-10-CM | POA: Diagnosis not present

## 2019-08-05 DIAGNOSIS — N186 End stage renal disease: Secondary | ICD-10-CM | POA: Diagnosis not present

## 2019-08-06 NOTE — Telephone Encounter (Cosign Needed)
Referral placed.

## 2019-08-06 NOTE — Addendum Note (Signed)
Addended by: [name] on: 08/06/2019 12:45 PM   Modules accepted: Orders

## 2019-08-08 DIAGNOSIS — Z992 Dependence on renal dialysis: Secondary | ICD-10-CM | POA: Diagnosis not present

## 2019-08-08 DIAGNOSIS — D631 Anemia in chronic kidney disease: Secondary | ICD-10-CM | POA: Diagnosis not present

## 2019-08-08 DIAGNOSIS — N186 End stage renal disease: Secondary | ICD-10-CM | POA: Diagnosis not present

## 2019-08-08 DIAGNOSIS — N2581 Secondary hyperparathyroidism of renal origin: Secondary | ICD-10-CM | POA: Diagnosis not present

## 2019-08-08 DIAGNOSIS — D689 Coagulation defect, unspecified: Secondary | ICD-10-CM | POA: Diagnosis not present

## 2019-08-08 DIAGNOSIS — Z23 Encounter for immunization: Secondary | ICD-10-CM | POA: Diagnosis not present

## 2019-08-10 DIAGNOSIS — N186 End stage renal disease: Secondary | ICD-10-CM | POA: Diagnosis not present

## 2019-08-10 DIAGNOSIS — D631 Anemia in chronic kidney disease: Secondary | ICD-10-CM | POA: Diagnosis not present

## 2019-08-10 DIAGNOSIS — Z23 Encounter for immunization: Secondary | ICD-10-CM | POA: Diagnosis not present

## 2019-08-10 DIAGNOSIS — Z992 Dependence on renal dialysis: Secondary | ICD-10-CM | POA: Diagnosis not present

## 2019-08-10 DIAGNOSIS — D689 Coagulation defect, unspecified: Secondary | ICD-10-CM | POA: Diagnosis not present

## 2019-08-10 DIAGNOSIS — N2581 Secondary hyperparathyroidism of renal origin: Secondary | ICD-10-CM | POA: Diagnosis not present

## 2019-08-12 DIAGNOSIS — N186 End stage renal disease: Secondary | ICD-10-CM | POA: Diagnosis not present

## 2019-08-12 DIAGNOSIS — D631 Anemia in chronic kidney disease: Secondary | ICD-10-CM | POA: Diagnosis not present

## 2019-08-12 DIAGNOSIS — Z23 Encounter for immunization: Secondary | ICD-10-CM | POA: Diagnosis not present

## 2019-08-12 DIAGNOSIS — D689 Coagulation defect, unspecified: Secondary | ICD-10-CM | POA: Diagnosis not present

## 2019-08-12 DIAGNOSIS — Z992 Dependence on renal dialysis: Secondary | ICD-10-CM | POA: Diagnosis not present

## 2019-08-12 DIAGNOSIS — N2581 Secondary hyperparathyroidism of renal origin: Secondary | ICD-10-CM | POA: Diagnosis not present

## 2019-08-15 DIAGNOSIS — Z23 Encounter for immunization: Secondary | ICD-10-CM | POA: Diagnosis not present

## 2019-08-15 DIAGNOSIS — D631 Anemia in chronic kidney disease: Secondary | ICD-10-CM | POA: Diagnosis not present

## 2019-08-15 DIAGNOSIS — D689 Coagulation defect, unspecified: Secondary | ICD-10-CM | POA: Diagnosis not present

## 2019-08-15 DIAGNOSIS — N2581 Secondary hyperparathyroidism of renal origin: Secondary | ICD-10-CM | POA: Diagnosis not present

## 2019-08-15 DIAGNOSIS — Z992 Dependence on renal dialysis: Secondary | ICD-10-CM | POA: Diagnosis not present

## 2019-08-15 DIAGNOSIS — N186 End stage renal disease: Secondary | ICD-10-CM | POA: Diagnosis not present

## 2019-08-17 DIAGNOSIS — D631 Anemia in chronic kidney disease: Secondary | ICD-10-CM | POA: Diagnosis not present

## 2019-08-17 DIAGNOSIS — N2581 Secondary hyperparathyroidism of renal origin: Secondary | ICD-10-CM | POA: Diagnosis not present

## 2019-08-17 DIAGNOSIS — D689 Coagulation defect, unspecified: Secondary | ICD-10-CM | POA: Diagnosis not present

## 2019-08-17 DIAGNOSIS — N186 End stage renal disease: Secondary | ICD-10-CM | POA: Diagnosis not present

## 2019-08-17 DIAGNOSIS — Z992 Dependence on renal dialysis: Secondary | ICD-10-CM | POA: Diagnosis not present

## 2019-08-17 DIAGNOSIS — Z23 Encounter for immunization: Secondary | ICD-10-CM | POA: Diagnosis not present

## 2019-08-18 ENCOUNTER — Other Ambulatory Visit: Payer: Self-pay

## 2019-08-18 DIAGNOSIS — Z1211 Encounter for screening for malignant neoplasm of colon: Secondary | ICD-10-CM

## 2019-08-19 DIAGNOSIS — Z23 Encounter for immunization: Secondary | ICD-10-CM | POA: Diagnosis not present

## 2019-08-19 DIAGNOSIS — D631 Anemia in chronic kidney disease: Secondary | ICD-10-CM | POA: Diagnosis not present

## 2019-08-19 DIAGNOSIS — N2581 Secondary hyperparathyroidism of renal origin: Secondary | ICD-10-CM | POA: Diagnosis not present

## 2019-08-19 DIAGNOSIS — N186 End stage renal disease: Secondary | ICD-10-CM | POA: Diagnosis not present

## 2019-08-19 DIAGNOSIS — D689 Coagulation defect, unspecified: Secondary | ICD-10-CM | POA: Diagnosis not present

## 2019-08-19 DIAGNOSIS — Z992 Dependence on renal dialysis: Secondary | ICD-10-CM | POA: Diagnosis not present

## 2019-08-21 ENCOUNTER — Telehealth (INDEPENDENT_AMBULATORY_CARE_PROVIDER_SITE_OTHER): Payer: Self-pay | Admitting: Gastroenterology

## 2019-08-21 ENCOUNTER — Other Ambulatory Visit: Payer: Self-pay

## 2019-08-21 DIAGNOSIS — Z1211 Encounter for screening for malignant neoplasm of colon: Secondary | ICD-10-CM

## 2019-08-21 MED ORDER — NA SULFATE-K SULFATE-MG SULF 17.5-3.13-1.6 GM/177ML PO SOLN: 1.0000 | Freq: Once | ORAL | 0 refills | Status: AC

## 2019-08-21 NOTE — Progress Notes (Signed)
Gastroenterology Pre-Procedure Review  Request Date: Friday 08/20 Requesting Physician: Dr. Bonna Gains  PATIENT REVIEW QUESTIONS: The patient responded to the following health history questions as indicated:    1. Are you having any GI issues? no 2. Do you have a personal history of Polyps? no 3. Do you have a family history of Colon Cancer or Polyps? no 4. Diabetes Mellitus? no 5. Joint replacements in the past 12 months?no 6. Major health problems in the past 3 months?no 7. Any artificial heart valves, MVP, or defibrillator?no    MEDICATIONS & ALLERGIES:    Patient reports the following regarding taking any anticoagulation/antiplatelet therapy:   Plavix, Coumadin, Eliquis, Xarelto, Lovenox, Pradaxa, Brilinta, or Effient? no Aspirin? no  Patient confirms/reports the following medications:  Current Outpatient Medications  Medication Sig Dispense Refill  . acetaminophen (TYLENOL) 500 MG tablet Take 1,000 mg by mouth every 6 (six) hours as needed for moderate pain or headache.    Marland Kitchen atorvastatin (LIPITOR) 10 MG tablet Take 1 tablet (10 mg total) by mouth daily. 30 tablet 2  . Cinacalcet HCl (SENSIPAR PO) Take by mouth.    . Methoxy PEG-Epoetin Beta (MIRCERA IJ) Mircera    . multivitamin (RENA-VIT) TABS tablet Take 1 tablet by mouth daily.    . VELPHORO 500 MG chewable tablet Chew 1,000 mg by mouth See admin instructions. Take 1000 mg with meals and 500 mg with snacks    . VITAMIN D, ERGOCALCIFEROL, PO Take by mouth.    . Na Sulfate-K Sulfate-Mg Sulf 17.5-3.13-1.6 GM/177ML SOLN Take 1 kit by mouth once for 1 dose. 354 mL 0   No current facility-administered medications for this visit.    Patient confirms/reports the following allergies:  No Known Allergies  No orders of the defined types were placed in this encounter.   AUTHORIZATION INFORMATION Primary Insurance: 1D#: Group #:  Secondary Insurance: 1D#: Group #:  SCHEDULE INFORMATION: Date: Friday  09/22/19 Time: Location:[facility]

## 2019-08-22 DIAGNOSIS — N186 End stage renal disease: Secondary | ICD-10-CM | POA: Diagnosis not present

## 2019-08-22 DIAGNOSIS — Z23 Encounter for immunization: Secondary | ICD-10-CM | POA: Diagnosis not present

## 2019-08-22 DIAGNOSIS — Z992 Dependence on renal dialysis: Secondary | ICD-10-CM | POA: Diagnosis not present

## 2019-08-22 DIAGNOSIS — N2581 Secondary hyperparathyroidism of renal origin: Secondary | ICD-10-CM | POA: Diagnosis not present

## 2019-08-22 DIAGNOSIS — D631 Anemia in chronic kidney disease: Secondary | ICD-10-CM | POA: Diagnosis not present

## 2019-08-22 DIAGNOSIS — D689 Coagulation defect, unspecified: Secondary | ICD-10-CM | POA: Diagnosis not present

## 2019-08-23 ENCOUNTER — Ambulatory Visit
Admission: RE | Admit: 2019-08-23 | Discharge: 2019-08-23 | Disposition: A | Discharge status: Home / Self Care | Payer: Medicare Other | Source: Ambulatory Visit | Attending: Internal Medicine | Admitting: Internal Medicine

## 2019-08-23 DIAGNOSIS — Z1231 Encounter for screening mammogram for malignant neoplasm of breast: Secondary | ICD-10-CM | POA: Insufficient documentation

## 2019-08-23 DIAGNOSIS — Z780 Asymptomatic menopausal state: Secondary | ICD-10-CM | POA: Diagnosis not present

## 2019-08-23 DIAGNOSIS — M85851 Other specified disorders of bone density and structure, right thigh: Secondary | ICD-10-CM | POA: Diagnosis not present

## 2019-08-24 ENCOUNTER — Other Ambulatory Visit: Payer: Self-pay | Admitting: Internal Medicine

## 2019-08-24 DIAGNOSIS — N186 End stage renal disease: Secondary | ICD-10-CM | POA: Diagnosis not present

## 2019-08-24 DIAGNOSIS — D631 Anemia in chronic kidney disease: Secondary | ICD-10-CM | POA: Diagnosis not present

## 2019-08-24 DIAGNOSIS — N6489 Other specified disorders of breast: Secondary | ICD-10-CM

## 2019-08-24 DIAGNOSIS — N2581 Secondary hyperparathyroidism of renal origin: Secondary | ICD-10-CM | POA: Diagnosis not present

## 2019-08-24 DIAGNOSIS — Z992 Dependence on renal dialysis: Secondary | ICD-10-CM | POA: Diagnosis not present

## 2019-08-24 DIAGNOSIS — D689 Coagulation defect, unspecified: Secondary | ICD-10-CM | POA: Diagnosis not present

## 2019-08-24 DIAGNOSIS — R928 Other abnormal and inconclusive findings on diagnostic imaging of breast: Secondary | ICD-10-CM

## 2019-08-24 DIAGNOSIS — Z23 Encounter for immunization: Secondary | ICD-10-CM | POA: Diagnosis not present

## 2019-08-26 DIAGNOSIS — D689 Coagulation defect, unspecified: Secondary | ICD-10-CM | POA: Diagnosis not present

## 2019-08-26 DIAGNOSIS — N2581 Secondary hyperparathyroidism of renal origin: Secondary | ICD-10-CM | POA: Diagnosis not present

## 2019-08-26 DIAGNOSIS — N186 End stage renal disease: Secondary | ICD-10-CM | POA: Diagnosis not present

## 2019-08-26 DIAGNOSIS — Z992 Dependence on renal dialysis: Secondary | ICD-10-CM | POA: Diagnosis not present

## 2019-08-26 DIAGNOSIS — Z23 Encounter for immunization: Secondary | ICD-10-CM | POA: Diagnosis not present

## 2019-08-26 DIAGNOSIS — D631 Anemia in chronic kidney disease: Secondary | ICD-10-CM | POA: Diagnosis not present

## 2019-08-29 DIAGNOSIS — D631 Anemia in chronic kidney disease: Secondary | ICD-10-CM | POA: Diagnosis not present

## 2019-08-29 DIAGNOSIS — N186 End stage renal disease: Secondary | ICD-10-CM | POA: Diagnosis not present

## 2019-08-29 DIAGNOSIS — Z992 Dependence on renal dialysis: Secondary | ICD-10-CM | POA: Diagnosis not present

## 2019-08-29 DIAGNOSIS — N2581 Secondary hyperparathyroidism of renal origin: Secondary | ICD-10-CM | POA: Diagnosis not present

## 2019-08-29 DIAGNOSIS — D689 Coagulation defect, unspecified: Secondary | ICD-10-CM | POA: Diagnosis not present

## 2019-08-29 DIAGNOSIS — Z23 Encounter for immunization: Secondary | ICD-10-CM | POA: Diagnosis not present

## 2019-08-31 DIAGNOSIS — N186 End stage renal disease: Secondary | ICD-10-CM | POA: Diagnosis not present

## 2019-08-31 DIAGNOSIS — Z992 Dependence on renal dialysis: Secondary | ICD-10-CM | POA: Diagnosis not present

## 2019-08-31 DIAGNOSIS — D689 Coagulation defect, unspecified: Secondary | ICD-10-CM | POA: Diagnosis not present

## 2019-08-31 DIAGNOSIS — N2581 Secondary hyperparathyroidism of renal origin: Secondary | ICD-10-CM | POA: Diagnosis not present

## 2019-08-31 DIAGNOSIS — Z23 Encounter for immunization: Secondary | ICD-10-CM | POA: Diagnosis not present

## 2019-08-31 DIAGNOSIS — D631 Anemia in chronic kidney disease: Secondary | ICD-10-CM | POA: Diagnosis not present

## 2019-09-02 DIAGNOSIS — N186 End stage renal disease: Secondary | ICD-10-CM | POA: Diagnosis not present

## 2019-09-02 DIAGNOSIS — D689 Coagulation defect, unspecified: Secondary | ICD-10-CM | POA: Diagnosis not present

## 2019-09-02 DIAGNOSIS — D631 Anemia in chronic kidney disease: Secondary | ICD-10-CM | POA: Diagnosis not present

## 2019-09-02 DIAGNOSIS — N2581 Secondary hyperparathyroidism of renal origin: Secondary | ICD-10-CM | POA: Diagnosis not present

## 2019-09-02 DIAGNOSIS — Z23 Encounter for immunization: Secondary | ICD-10-CM | POA: Diagnosis not present

## 2019-09-02 DIAGNOSIS — I129 Hypertensive chronic kidney disease with stage 1 through stage 4 chronic kidney disease, or unspecified chronic kidney disease: Secondary | ICD-10-CM | POA: Diagnosis not present

## 2019-09-02 DIAGNOSIS — Z992 Dependence on renal dialysis: Secondary | ICD-10-CM | POA: Diagnosis not present

## 2019-09-05 DIAGNOSIS — Z23 Encounter for immunization: Secondary | ICD-10-CM | POA: Diagnosis not present

## 2019-09-05 DIAGNOSIS — D689 Coagulation defect, unspecified: Secondary | ICD-10-CM | POA: Diagnosis not present

## 2019-09-05 DIAGNOSIS — N2581 Secondary hyperparathyroidism of renal origin: Secondary | ICD-10-CM | POA: Diagnosis not present

## 2019-09-05 DIAGNOSIS — Z992 Dependence on renal dialysis: Secondary | ICD-10-CM | POA: Diagnosis not present

## 2019-09-05 DIAGNOSIS — D508 Other iron deficiency anemias: Secondary | ICD-10-CM | POA: Diagnosis not present

## 2019-09-05 DIAGNOSIS — N186 End stage renal disease: Secondary | ICD-10-CM | POA: Diagnosis not present

## 2019-09-05 DIAGNOSIS — D631 Anemia in chronic kidney disease: Secondary | ICD-10-CM | POA: Diagnosis not present

## 2019-09-07 DIAGNOSIS — N2581 Secondary hyperparathyroidism of renal origin: Secondary | ICD-10-CM | POA: Diagnosis not present

## 2019-09-07 DIAGNOSIS — Z992 Dependence on renal dialysis: Secondary | ICD-10-CM | POA: Diagnosis not present

## 2019-09-07 DIAGNOSIS — D689 Coagulation defect, unspecified: Secondary | ICD-10-CM | POA: Diagnosis not present

## 2019-09-07 DIAGNOSIS — D508 Other iron deficiency anemias: Secondary | ICD-10-CM | POA: Diagnosis not present

## 2019-09-07 DIAGNOSIS — D631 Anemia in chronic kidney disease: Secondary | ICD-10-CM | POA: Diagnosis not present

## 2019-09-07 DIAGNOSIS — Z23 Encounter for immunization: Secondary | ICD-10-CM | POA: Diagnosis not present

## 2019-09-07 DIAGNOSIS — N186 End stage renal disease: Secondary | ICD-10-CM | POA: Diagnosis not present

## 2019-09-08 ENCOUNTER — Other Ambulatory Visit: Payer: Self-pay

## 2019-09-08 ENCOUNTER — Ambulatory Visit
Admission: RE | Admit: 2019-09-08 | Discharge: 2019-09-08 | Disposition: A | Discharge status: Home / Self Care | Payer: Medicare Other | Source: Ambulatory Visit | Attending: Internal Medicine | Admitting: Internal Medicine

## 2019-09-08 DIAGNOSIS — N6489 Other specified disorders of breast: Secondary | ICD-10-CM | POA: Diagnosis not present

## 2019-09-08 DIAGNOSIS — R928 Other abnormal and inconclusive findings on diagnostic imaging of breast: Secondary | ICD-10-CM | POA: Insufficient documentation

## 2019-09-08 DIAGNOSIS — R922 Inconclusive mammogram: Secondary | ICD-10-CM | POA: Diagnosis not present

## 2019-09-09 DIAGNOSIS — D631 Anemia in chronic kidney disease: Secondary | ICD-10-CM | POA: Diagnosis not present

## 2019-09-09 DIAGNOSIS — D508 Other iron deficiency anemias: Secondary | ICD-10-CM | POA: Diagnosis not present

## 2019-09-09 DIAGNOSIS — Z992 Dependence on renal dialysis: Secondary | ICD-10-CM | POA: Diagnosis not present

## 2019-09-09 DIAGNOSIS — Z23 Encounter for immunization: Secondary | ICD-10-CM | POA: Diagnosis not present

## 2019-09-09 DIAGNOSIS — D689 Coagulation defect, unspecified: Secondary | ICD-10-CM | POA: Diagnosis not present

## 2019-09-09 DIAGNOSIS — N2581 Secondary hyperparathyroidism of renal origin: Secondary | ICD-10-CM | POA: Diagnosis not present

## 2019-09-09 DIAGNOSIS — N186 End stage renal disease: Secondary | ICD-10-CM | POA: Diagnosis not present

## 2019-09-12 DIAGNOSIS — N2581 Secondary hyperparathyroidism of renal origin: Secondary | ICD-10-CM | POA: Diagnosis not present

## 2019-09-12 DIAGNOSIS — D689 Coagulation defect, unspecified: Secondary | ICD-10-CM | POA: Diagnosis not present

## 2019-09-12 DIAGNOSIS — D508 Other iron deficiency anemias: Secondary | ICD-10-CM | POA: Diagnosis not present

## 2019-09-12 DIAGNOSIS — Z23 Encounter for immunization: Secondary | ICD-10-CM | POA: Diagnosis not present

## 2019-09-12 DIAGNOSIS — N186 End stage renal disease: Secondary | ICD-10-CM | POA: Diagnosis not present

## 2019-09-12 DIAGNOSIS — D631 Anemia in chronic kidney disease: Secondary | ICD-10-CM | POA: Diagnosis not present

## 2019-09-12 DIAGNOSIS — Z992 Dependence on renal dialysis: Secondary | ICD-10-CM | POA: Diagnosis not present

## 2019-09-14 DIAGNOSIS — N2581 Secondary hyperparathyroidism of renal origin: Secondary | ICD-10-CM | POA: Diagnosis not present

## 2019-09-14 DIAGNOSIS — D689 Coagulation defect, unspecified: Secondary | ICD-10-CM | POA: Diagnosis not present

## 2019-09-14 DIAGNOSIS — N186 End stage renal disease: Secondary | ICD-10-CM | POA: Diagnosis not present

## 2019-09-14 DIAGNOSIS — Z23 Encounter for immunization: Secondary | ICD-10-CM | POA: Diagnosis not present

## 2019-09-14 DIAGNOSIS — D508 Other iron deficiency anemias: Secondary | ICD-10-CM | POA: Diagnosis not present

## 2019-09-14 DIAGNOSIS — Z992 Dependence on renal dialysis: Secondary | ICD-10-CM | POA: Diagnosis not present

## 2019-09-14 DIAGNOSIS — D631 Anemia in chronic kidney disease: Secondary | ICD-10-CM | POA: Diagnosis not present

## 2019-09-16 DIAGNOSIS — D689 Coagulation defect, unspecified: Secondary | ICD-10-CM | POA: Diagnosis not present

## 2019-09-16 DIAGNOSIS — D631 Anemia in chronic kidney disease: Secondary | ICD-10-CM | POA: Diagnosis not present

## 2019-09-16 DIAGNOSIS — D508 Other iron deficiency anemias: Secondary | ICD-10-CM | POA: Diagnosis not present

## 2019-09-16 DIAGNOSIS — Z992 Dependence on renal dialysis: Secondary | ICD-10-CM | POA: Diagnosis not present

## 2019-09-16 DIAGNOSIS — N2581 Secondary hyperparathyroidism of renal origin: Secondary | ICD-10-CM | POA: Diagnosis not present

## 2019-09-16 DIAGNOSIS — Z23 Encounter for immunization: Secondary | ICD-10-CM | POA: Diagnosis not present

## 2019-09-16 DIAGNOSIS — N186 End stage renal disease: Secondary | ICD-10-CM | POA: Diagnosis not present

## 2019-09-19 DIAGNOSIS — D689 Coagulation defect, unspecified: Secondary | ICD-10-CM | POA: Diagnosis not present

## 2019-09-19 DIAGNOSIS — Z23 Encounter for immunization: Secondary | ICD-10-CM | POA: Diagnosis not present

## 2019-09-19 DIAGNOSIS — D631 Anemia in chronic kidney disease: Secondary | ICD-10-CM | POA: Diagnosis not present

## 2019-09-19 DIAGNOSIS — D508 Other iron deficiency anemias: Secondary | ICD-10-CM | POA: Diagnosis not present

## 2019-09-19 DIAGNOSIS — Z992 Dependence on renal dialysis: Secondary | ICD-10-CM | POA: Diagnosis not present

## 2019-09-19 DIAGNOSIS — N186 End stage renal disease: Secondary | ICD-10-CM | POA: Diagnosis not present

## 2019-09-19 DIAGNOSIS — N2581 Secondary hyperparathyroidism of renal origin: Secondary | ICD-10-CM | POA: Diagnosis not present

## 2019-09-20 ENCOUNTER — Other Ambulatory Visit: Payer: Self-pay

## 2019-09-20 ENCOUNTER — Other Ambulatory Visit
Admission: RE | Admit: 2019-09-20 | Discharge: 2019-09-20 | Disposition: A | Discharge status: Home / Self Care | Payer: Medicare Other | Source: Ambulatory Visit | Attending: Gastroenterology | Admitting: Gastroenterology

## 2019-09-20 DIAGNOSIS — Z20822 Contact with and (suspected) exposure to covid-19: Secondary | ICD-10-CM | POA: Insufficient documentation

## 2019-09-20 DIAGNOSIS — Z01812 Encounter for preprocedural laboratory examination: Secondary | ICD-10-CM | POA: Diagnosis not present

## 2019-09-20 LAB — SARS CORONAVIRUS 2 (TAT 6-24 HRS): SARS Coronavirus 2: NEGATIVE

## 2019-09-21 DIAGNOSIS — Z23 Encounter for immunization: Secondary | ICD-10-CM | POA: Diagnosis not present

## 2019-09-21 DIAGNOSIS — D631 Anemia in chronic kidney disease: Secondary | ICD-10-CM | POA: Diagnosis not present

## 2019-09-21 DIAGNOSIS — N2581 Secondary hyperparathyroidism of renal origin: Secondary | ICD-10-CM | POA: Diagnosis not present

## 2019-09-21 DIAGNOSIS — D689 Coagulation defect, unspecified: Secondary | ICD-10-CM | POA: Diagnosis not present

## 2019-09-21 DIAGNOSIS — Z992 Dependence on renal dialysis: Secondary | ICD-10-CM | POA: Diagnosis not present

## 2019-09-21 DIAGNOSIS — N186 End stage renal disease: Secondary | ICD-10-CM | POA: Diagnosis not present

## 2019-09-21 DIAGNOSIS — D508 Other iron deficiency anemias: Secondary | ICD-10-CM | POA: Diagnosis not present

## 2019-09-22 ENCOUNTER — Encounter: Admission: RE | Payer: Self-pay | Source: Home / Self Care

## 2019-09-22 ENCOUNTER — Telehealth: Payer: Self-pay | Admitting: Gastroenterology

## 2019-09-22 ENCOUNTER — Ambulatory Visit: Admission: RE | Admit: 2019-09-22 | Payer: Medicare Other | Source: Home / Self Care | Admitting: Gastroenterology

## 2019-09-22 SURGERY — COLONOSCOPY WITH PROPOFOL
Anesthesia: General

## 2019-09-22 NOTE — Telephone Encounter (Signed)
Called patient to ask her when she would like to reschedule her colonoscopy and she stated that after vomiting all of her prep yesterday, she did not want to try again. Patient stated that she would communicate it to her PCP and because she is a dialysis patient. I told her that once she had spoken to her, to please give [place] a call back to reschedule if that's what they both agree on. Patient agreed.

## 2019-09-22 NOTE — Telephone Encounter (Signed)
Patient states she was suppose to have her colon this morning, but threw up the first bottle of prep. Patient wants to know what to do since she only has one bottle and if she can not keep it down. Please call pt back to discuss.

## 2019-09-23 DIAGNOSIS — N2581 Secondary hyperparathyroidism of renal origin: Secondary | ICD-10-CM | POA: Diagnosis not present

## 2019-09-23 DIAGNOSIS — Z23 Encounter for immunization: Secondary | ICD-10-CM | POA: Diagnosis not present

## 2019-09-23 DIAGNOSIS — D631 Anemia in chronic kidney disease: Secondary | ICD-10-CM | POA: Diagnosis not present

## 2019-09-23 DIAGNOSIS — Z992 Dependence on renal dialysis: Secondary | ICD-10-CM | POA: Diagnosis not present

## 2019-09-23 DIAGNOSIS — N186 End stage renal disease: Secondary | ICD-10-CM | POA: Diagnosis not present

## 2019-09-23 DIAGNOSIS — D508 Other iron deficiency anemias: Secondary | ICD-10-CM | POA: Diagnosis not present

## 2019-09-23 DIAGNOSIS — D689 Coagulation defect, unspecified: Secondary | ICD-10-CM | POA: Diagnosis not present

## 2019-09-25 ENCOUNTER — Encounter: Payer: Self-pay | Admitting: Internal Medicine

## 2019-09-25 ENCOUNTER — Other Ambulatory Visit: Payer: Self-pay

## 2019-09-25 ENCOUNTER — Ambulatory Visit (INDEPENDENT_AMBULATORY_CARE_PROVIDER_SITE_OTHER): Payer: Medicare Other | Admitting: Internal Medicine

## 2019-09-25 VITALS — BP 136/78 | HR 85 | Temp 97.8°F | Wt 135.0 lb

## 2019-09-25 DIAGNOSIS — M810 Age-related osteoporosis without current pathological fracture: Secondary | ICD-10-CM

## 2019-09-25 DIAGNOSIS — Z1211 Encounter for screening for malignant neoplasm of colon: Secondary | ICD-10-CM | POA: Diagnosis not present

## 2019-09-25 NOTE — Progress Notes (Signed)
Subjective:    Patient ID: Kimberly Frost, female    DOB: 06-11-1950, 69 y.o.   MRN: [id number]  HPI  Patient presents the clinic to discuss treatment of osteoporosis.  I recommended that she started on Prolia injections.  I did receive clearance from her nephrologist Dr. Posey Pronto.  She also reports that she was unable to tolerate the colonoscopy prep. She reports nausea and vomiting with the prep. She has no family history of colon cancer or polyps.  Review of Systems      Past Medical History:  Diagnosis Date  . Chronic kidney disease   . Edema    RLE  . Hepatitis    Hep C  . History of blood transfusion   . Hypertension   . Wears dentures     Current Outpatient Medications  Medication Sig Dispense Refill  . acetaminophen (TYLENOL) 500 MG tablet Take 1,000 mg by mouth every 6 (six) hours as needed for moderate pain or headache.    Marland Kitchen atorvastatin (LIPITOR) 10 MG tablet Take 1 tablet (10 mg total) by mouth daily. 30 tablet 2  . Cinacalcet HCl (SENSIPAR PO) Take by mouth.    . Methoxy PEG-Epoetin Beta (MIRCERA IJ) Mircera    . multivitamin (RENA-VIT) TABS tablet Take 1 tablet by mouth daily.    . VELPHORO 500 MG chewable tablet Chew 1,000 mg by mouth See admin instructions. Take 1000 mg with meals and 500 mg with snacks    . VITAMIN D, ERGOCALCIFEROL, PO Take by mouth.     No current facility-administered medications for this visit.    No Known Allergies  Family History  Problem Relation Age of Onset  . Diabetes Mother   . Hypertension Mother   . Breast cancer Mother 29  . Bone cancer Father   . Kidney cancer Maternal Grandmother   . Stroke Brother   . Diabetes Brother   . Breast cancer Maternal Aunt 22  . Breast cancer Maternal Aunt 70    Social History   Socioeconomic History  . Marital status: Single    Spouse name: Not on file  . Number of children: 0  . Years of education: Not on file  . Highest education level: Not on file  Occupational History  .  Occupation: on Fish farm manager   Tobacco Use  . Smoking status: Former Smoker    Types: Cigarettes  . Smokeless tobacco: Never Used  Vaping Use  . Vaping Use: Never used  Substance and Sexual Activity  . Alcohol use: Not Currently    Alcohol/week: 6.0 standard drinks    Types: 6 Cans of beer per week  . Drug use: Not Currently    Types: Marijuana    Comment: occasional   . Sexual activity: Not Currently  Other Topics Concern  . Not on file  Social History Narrative   Lives at home in private residence with fiance' [place] Strain:   . Difficulty of Paying Living Expenses: Not on file  Food Insecurity:   . Worried About Charity fundraiser in the Last Year: Not on file  . Ran Out of Food in the Last Year: Not on file  Transportation Needs:   . Lack of Transportation (Medical): Not on file  . Lack of Transportation (Non-Medical): Not on file  Physical Activity:   . Days of Exercise per Week: Not on file  . Minutes of Exercise per Session: Not on  file  Stress:   . Feeling of Stress : Not on file  Social Connections:   . Frequency of Communication with Friends and Family: Not on file  . Frequency of Social Gatherings with Friends and Family: Not on file  . Attends Religious Services: Not on file  . Active Member of Clubs or Organizations: Not on file  . Attends Archivist Meetings: Not on file  . Marital Status: Not on file  Intimate Partner Violence:   . Fear of Current or Ex-Partner: Not on file  . Emotionally Abused: Not on file  . Physically Abused: Not on file  . Sexually Abused: Not on file     Constitutional: Denies fever, malaise, fatigue, headache or abrupt weight changes.  Respiratory: Denies difficulty breathing, shortness of breath, cough or sputum production.   Cardiovascular: Denies chest pain, chest tightness, palpitations or swelling in the hands or feet.  Gastrointestinal: Pt reports nausea  and vomiting with colonoscopy prep. Denies abdominal pain, bloating, constipation, diarrhea or blood in the stool.  Musculoskeletal: Denies decrease in range of motion, difficulty with gait, muscle pain or joint pain and swelling.   No other specific complaints in a complete review of systems (except as listed in HPI above).  Objective:   Physical Exam  BP 136/78   Pulse 85   Temp 97.8 F (36.6 C) (Temporal)   Wt 135 lb (61.2 kg)   SpO2 98%   BMI 21.63 kg/m   Wt Readings from Last 3 Encounters:  07/31/19 129 lb (58.5 kg)  07/14/19 129 lb (58.5 kg)  06/14/19 128 lb (58.1 kg)    General: Appears her stated age, well developed, well nourished in NAD. Cardiovascular: Normal rate and rhythm.  Pulmonary/Chest: Normal effort and positive vesicular breath sounds.  Abdomen: Soft and nontender. Normal bowel sounds. No distention or masses noted.  Musculoskeletal:  No difficulty with gait.  Neurological: Alert and oriented.    BMET    Component Value Date/Time   NA 139 07/31/2019 1125   NA 138 06/23/2018 0000   K 4.0 07/31/2019 1125   K 4.9 06/23/2018 0000   CL 95 (L) 07/31/2019 1125   CO2 31 07/31/2019 1125   GLUCOSE 168 (H) 07/31/2019 1125   BUN 40 (H) 07/31/2019 1125   BUN 61 06/23/2018 0000   CREATININE 8.94 (HH) 07/31/2019 1125   CREATININE 7.13 06/23/2018 0000   CALCIUM 8.7 07/31/2019 1125   CALCIUM 8.8 06/23/2018 0000   GFRNONAA 9 (L) 02/27/2018 0533   GFRAA 6 06/23/2018 0000    Lipid Panel     Component Value Date/Time   CHOL 254 (H) 07/31/2019 1125   TRIG 110.0 07/31/2019 1125   HDL 44.10 07/31/2019 1125   CHOLHDL 6 07/31/2019 1125   VLDL 22.0 07/31/2019 1125   LDLCALC 188 (H) 07/31/2019 1125    CBC    Component Value Date/Time   WBC 8.3 07/31/2019 1125   RBC 3.52 (L) 07/31/2019 1125   HGB 10.3 (L) 07/31/2019 1125   HCT 30.9 (L) 07/31/2019 1125   HCT 27 (A) 06/23/2018 0000   PLT 247.0 07/31/2019 1125   MCV 87.7 07/31/2019 1125   MCH 28.4  02/27/2018 0533   MCHC 33.2 07/31/2019 1125   RDW 18.0 (H) 07/31/2019 1125   LYMPHSABS 2.3 02/26/2018 0930   MONOABS 0.3 02/26/2018 0930   EOSABS 0.2 02/26/2018 0930   BASOSABS 0.0 02/26/2018 0930    Hgb A1C No results found for: HGBA1C  Assessment & Plan:   Screen for Colon Cancer:  Unable to tolerate prep Will order Cologuard  Osteoporosis:  Will proceed with Prolia She understands she will be called when insurance authorization is obtained to be scheduled for labs and injection Encouraged daily weightbearing exercise  Return precautions discussed  Webb Silversmith, NP This visit occurred during the SARS-CoV-2 public health emergency.  Safety protocols were in place, including screening questions prior to the visit, additional usage of staff PPE, and extensive cleaning of exam room while observing appropriate contact time as indicated for disinfecting solutions.

## 2019-09-25 NOTE — Patient Instructions (Signed)
Osteoporosis  Osteoporosis happens when your bones get thin and weak. This can cause your bones to break (fracture) more easily. You can do things at home to make your bones stronger. Follow these instructions at home:  Activity  Exercise as told by your doctor. Ask your doctor what activities are safe for you. You should do: ? Exercises that make your muscles work to hold your body weight up (weight-bearing exercises). These include tai chi, yoga, and walking. ? Exercises to make your muscles stronger. One example is lifting weights. Lifestyle  Limit alcohol intake to no more than 1 drink a day for nonpregnant women and 2 drinks a day for men. One drink equals 12 oz of beer, 5 oz of wine, or 1 oz of hard liquor.  Do not use any products that have nicotine or tobacco in them. These include cigarettes and e-cigarettes. If you need help quitting, ask your doctor. Preventing falls  Use tools to help you move around (mobility aids) as needed. These include canes, walkers, scooters, and crutches.  Keep rooms well-lit and free of clutter.  Put away things that could make you trip. These include cords and rugs.  Install safety rails on stairs. Install grab bars in bathrooms.  Use rubber mats in slippery areas, like bathrooms.  Wear shoes that: ? Fit you well. ? Support your feet. ? Have closed toes. ? Have rubber soles or low heels.  Tell your doctor about all of the medicines you are taking. Some medicines can make you more likely to fall. General instructions  Eat plenty of calcium and vitamin D. These nutrients are good for your bones. Good sources of calcium and vitamin D include: ? Some fatty fish, such as salmon and tuna. ? Foods that have calcium and vitamin D added to them (fortified foods). For example, some breakfast cereals are fortified with calcium and vitamin D. ? Egg yolks. ? Cheese. ? Liver.  Take over-the-counter and prescription medicines only as told by your  doctor.  Keep all follow-up visits as told by your doctor. This is important. Contact a doctor if:  You have not been tested (screened) for osteoporosis and you are: ? A woman who is age 65 or older. ? A man who is age 70 or older. Get help right away if:  You fall.  You get hurt. Summary  Osteoporosis happens when your bones get thin and weak.  Weak bones can break (fracture) more easily.  Eat plenty of calcium and vitamin D. These nutrients are good for your bones.  Tell your doctor about all of the medicines that you take. This information is not intended to replace advice given to you by your health care provider. Make sure you discuss any questions you have with your health care provider. Document Revised: 01/01/2017 Document Reviewed: 11/13/2016 Elsevier Patient Education  2020 Elsevier Inc.  

## 2019-09-26 DIAGNOSIS — N2581 Secondary hyperparathyroidism of renal origin: Secondary | ICD-10-CM | POA: Diagnosis not present

## 2019-09-26 DIAGNOSIS — D508 Other iron deficiency anemias: Secondary | ICD-10-CM | POA: Diagnosis not present

## 2019-09-26 DIAGNOSIS — N186 End stage renal disease: Secondary | ICD-10-CM | POA: Diagnosis not present

## 2019-09-26 DIAGNOSIS — D631 Anemia in chronic kidney disease: Secondary | ICD-10-CM | POA: Diagnosis not present

## 2019-09-26 DIAGNOSIS — Z992 Dependence on renal dialysis: Secondary | ICD-10-CM | POA: Diagnosis not present

## 2019-09-26 DIAGNOSIS — Z23 Encounter for immunization: Secondary | ICD-10-CM | POA: Diagnosis not present

## 2019-09-26 DIAGNOSIS — D689 Coagulation defect, unspecified: Secondary | ICD-10-CM | POA: Diagnosis not present

## 2019-09-28 DIAGNOSIS — Z992 Dependence on renal dialysis: Secondary | ICD-10-CM | POA: Diagnosis not present

## 2019-09-28 DIAGNOSIS — N2581 Secondary hyperparathyroidism of renal origin: Secondary | ICD-10-CM | POA: Diagnosis not present

## 2019-09-28 DIAGNOSIS — Z23 Encounter for immunization: Secondary | ICD-10-CM | POA: Diagnosis not present

## 2019-09-28 DIAGNOSIS — D631 Anemia in chronic kidney disease: Secondary | ICD-10-CM | POA: Diagnosis not present

## 2019-09-28 DIAGNOSIS — D508 Other iron deficiency anemias: Secondary | ICD-10-CM | POA: Diagnosis not present

## 2019-09-28 DIAGNOSIS — D689 Coagulation defect, unspecified: Secondary | ICD-10-CM | POA: Diagnosis not present

## 2019-09-28 DIAGNOSIS — N186 End stage renal disease: Secondary | ICD-10-CM | POA: Diagnosis not present

## 2019-09-30 DIAGNOSIS — N2581 Secondary hyperparathyroidism of renal origin: Secondary | ICD-10-CM | POA: Diagnosis not present

## 2019-09-30 DIAGNOSIS — Z992 Dependence on renal dialysis: Secondary | ICD-10-CM | POA: Diagnosis not present

## 2019-09-30 DIAGNOSIS — D689 Coagulation defect, unspecified: Secondary | ICD-10-CM | POA: Diagnosis not present

## 2019-09-30 DIAGNOSIS — N186 End stage renal disease: Secondary | ICD-10-CM | POA: Diagnosis not present

## 2019-09-30 DIAGNOSIS — Z23 Encounter for immunization: Secondary | ICD-10-CM | POA: Diagnosis not present

## 2019-09-30 DIAGNOSIS — D508 Other iron deficiency anemias: Secondary | ICD-10-CM | POA: Diagnosis not present

## 2019-09-30 DIAGNOSIS — D631 Anemia in chronic kidney disease: Secondary | ICD-10-CM | POA: Diagnosis not present

## 2019-10-03 DIAGNOSIS — Z23 Encounter for immunization: Secondary | ICD-10-CM | POA: Diagnosis not present

## 2019-10-03 DIAGNOSIS — N2581 Secondary hyperparathyroidism of renal origin: Secondary | ICD-10-CM | POA: Diagnosis not present

## 2019-10-03 DIAGNOSIS — N186 End stage renal disease: Secondary | ICD-10-CM | POA: Diagnosis not present

## 2019-10-03 DIAGNOSIS — D508 Other iron deficiency anemias: Secondary | ICD-10-CM | POA: Diagnosis not present

## 2019-10-03 DIAGNOSIS — D631 Anemia in chronic kidney disease: Secondary | ICD-10-CM | POA: Diagnosis not present

## 2019-10-03 DIAGNOSIS — I129 Hypertensive chronic kidney disease with stage 1 through stage 4 chronic kidney disease, or unspecified chronic kidney disease: Secondary | ICD-10-CM | POA: Diagnosis not present

## 2019-10-03 DIAGNOSIS — Z1211 Encounter for screening for malignant neoplasm of colon: Secondary | ICD-10-CM | POA: Diagnosis not present

## 2019-10-03 DIAGNOSIS — Z992 Dependence on renal dialysis: Secondary | ICD-10-CM | POA: Diagnosis not present

## 2019-10-03 DIAGNOSIS — D689 Coagulation defect, unspecified: Secondary | ICD-10-CM | POA: Diagnosis not present

## 2019-10-05 ENCOUNTER — Telehealth: Payer: Self-pay

## 2019-10-05 ENCOUNTER — Other Ambulatory Visit: Payer: Self-pay

## 2019-10-05 DIAGNOSIS — D631 Anemia in chronic kidney disease: Secondary | ICD-10-CM | POA: Diagnosis not present

## 2019-10-05 DIAGNOSIS — N186 End stage renal disease: Secondary | ICD-10-CM | POA: Diagnosis not present

## 2019-10-05 DIAGNOSIS — D508 Other iron deficiency anemias: Secondary | ICD-10-CM | POA: Diagnosis not present

## 2019-10-05 DIAGNOSIS — Z992 Dependence on renal dialysis: Secondary | ICD-10-CM | POA: Diagnosis not present

## 2019-10-05 DIAGNOSIS — N2581 Secondary hyperparathyroidism of renal origin: Secondary | ICD-10-CM | POA: Diagnosis not present

## 2019-10-05 DIAGNOSIS — D689 Coagulation defect, unspecified: Secondary | ICD-10-CM | POA: Diagnosis not present

## 2019-10-05 MED ORDER — SUTAB 1479-225-188 MG PO TABS
ORAL_TABLET | ORAL | 0 refills | Status: DC
Start: 2019-10-05 — End: 2019-12-26

## 2019-10-05 NOTE — Telephone Encounter (Signed)
Dr. Posey Pronto from [facility] called stating that he wanted patient to have another try for a colonoscopy trying something else then what she had last time. I then called patient to ask her that Dr. Posey Pronto wanted to try doing her colonoscopy again with another medication. I told her that she could try Sutab instead of taking liquids. Patient agreed. Patient also scheduled her colonoscopy to be done on 10/13/2019 at [facility]. I explained what she needed to do and that I would be mailing her the instructions as well. Patient understood and had no further questions. I then called Dr. Posey Pronto to let him know that the patient agreed on rescheduling her colonoscopy for next Friday 10/13/2019 and that she would try Sutab this time around. He stated that he was glad that she was trying something else since he needs her to do the colonoscopy so she could be on the kidney transplant list.

## 2019-10-07 DIAGNOSIS — N186 End stage renal disease: Secondary | ICD-10-CM | POA: Diagnosis not present

## 2019-10-07 DIAGNOSIS — Z992 Dependence on renal dialysis: Secondary | ICD-10-CM | POA: Diagnosis not present

## 2019-10-07 DIAGNOSIS — D508 Other iron deficiency anemias: Secondary | ICD-10-CM | POA: Diagnosis not present

## 2019-10-07 DIAGNOSIS — N2581 Secondary hyperparathyroidism of renal origin: Secondary | ICD-10-CM | POA: Diagnosis not present

## 2019-10-07 DIAGNOSIS — D631 Anemia in chronic kidney disease: Secondary | ICD-10-CM | POA: Diagnosis not present

## 2019-10-07 DIAGNOSIS — D689 Coagulation defect, unspecified: Secondary | ICD-10-CM | POA: Diagnosis not present

## 2019-10-10 DIAGNOSIS — D631 Anemia in chronic kidney disease: Secondary | ICD-10-CM | POA: Diagnosis not present

## 2019-10-10 DIAGNOSIS — Z992 Dependence on renal dialysis: Secondary | ICD-10-CM | POA: Diagnosis not present

## 2019-10-10 DIAGNOSIS — D508 Other iron deficiency anemias: Secondary | ICD-10-CM | POA: Diagnosis not present

## 2019-10-10 DIAGNOSIS — N2581 Secondary hyperparathyroidism of renal origin: Secondary | ICD-10-CM | POA: Diagnosis not present

## 2019-10-10 DIAGNOSIS — N186 End stage renal disease: Secondary | ICD-10-CM | POA: Diagnosis not present

## 2019-10-10 DIAGNOSIS — D689 Coagulation defect, unspecified: Secondary | ICD-10-CM | POA: Diagnosis not present

## 2019-10-11 ENCOUNTER — Other Ambulatory Visit: Payer: Self-pay

## 2019-10-11 ENCOUNTER — Other Ambulatory Visit
Admission: RE | Admit: 2019-10-11 | Discharge: 2019-10-11 | Disposition: A | Discharge status: Home / Self Care | Payer: Medicare Other | Source: Ambulatory Visit | Attending: Gastroenterology | Admitting: Gastroenterology

## 2019-10-11 DIAGNOSIS — Z20822 Contact with and (suspected) exposure to covid-19: Secondary | ICD-10-CM | POA: Insufficient documentation

## 2019-10-11 DIAGNOSIS — Z01812 Encounter for preprocedural laboratory examination: Secondary | ICD-10-CM | POA: Insufficient documentation

## 2019-10-11 LAB — SARS CORONAVIRUS 2 (TAT 6-24 HRS): SARS Coronavirus 2: NEGATIVE

## 2019-10-12 ENCOUNTER — Other Ambulatory Visit: Payer: Self-pay

## 2019-10-12 ENCOUNTER — Telehealth: Payer: Self-pay

## 2019-10-12 DIAGNOSIS — N186 End stage renal disease: Secondary | ICD-10-CM | POA: Diagnosis not present

## 2019-10-12 DIAGNOSIS — N2581 Secondary hyperparathyroidism of renal origin: Secondary | ICD-10-CM | POA: Diagnosis not present

## 2019-10-12 DIAGNOSIS — Z992 Dependence on renal dialysis: Secondary | ICD-10-CM | POA: Diagnosis not present

## 2019-10-12 DIAGNOSIS — D631 Anemia in chronic kidney disease: Secondary | ICD-10-CM | POA: Diagnosis not present

## 2019-10-12 DIAGNOSIS — D689 Coagulation defect, unspecified: Secondary | ICD-10-CM | POA: Diagnosis not present

## 2019-10-12 DIAGNOSIS — D508 Other iron deficiency anemias: Secondary | ICD-10-CM | POA: Diagnosis not present

## 2019-10-12 DIAGNOSIS — Z1211 Encounter for screening for malignant neoplasm of colon: Secondary | ICD-10-CM

## 2019-10-12 NOTE — Telephone Encounter (Signed)
Patient is in the system to have her colonoscopy done on 10/13/2019 and patient was informed.

## 2019-10-12 NOTE — Telephone Encounter (Signed)
Patient called and left a voicemail stating that she was supposed to have a colonoscopy for 10/13/2019. She states the ENDO department does not have her scheduled for 10/13/2019. It looks like appointment is not scheduled  Informed patient Kimberly Frost would call her back

## 2019-10-13 ENCOUNTER — Ambulatory Visit: Admission: RE | Admit: 2019-10-13 | Payer: Medicare Other | Source: Home / Self Care | Admitting: Gastroenterology

## 2019-10-13 ENCOUNTER — Encounter: Admission: RE | Payer: Self-pay | Source: Home / Self Care

## 2019-10-13 ENCOUNTER — Telehealth: Payer: Self-pay | Admitting: Gastroenterology

## 2019-10-13 LAB — COLOGUARD: COLOGUARD: NEGATIVE

## 2019-10-13 SURGERY — COLONOSCOPY WITH PROPOFOL
Anesthesia: General

## 2019-10-13 NOTE — Telephone Encounter (Signed)
Patient called to make sure her procedure was cancelled for today. The prep medicine made her sick and she couldn't keep them down. Per Patient

## 2019-10-13 NOTE — Telephone Encounter (Signed)
Dr. Bonna Gains sent me a secure chat letting me know that the patient did not show up to her procedure-colonoscopy this AM. Dr. Bonna Gains wanted me to reschedule her colonoscopy. However, patient declined at this time. Patient stated that she was on her 9 th tablet when she started to vomit. I told her that I had spoken to her nephrologist-Dr. Posey Pronto and that he would speak with her on her upcoming appointment with him. Patient agreed. Dr. Posey Pronto stated that he would call me back once patient agreed on something else to try to do the colonoscopy.  Dr. Eliberto Ivory Nephrology [phone]

## 2019-10-14 DIAGNOSIS — N2581 Secondary hyperparathyroidism of renal origin: Secondary | ICD-10-CM | POA: Diagnosis not present

## 2019-10-14 DIAGNOSIS — D631 Anemia in chronic kidney disease: Secondary | ICD-10-CM | POA: Diagnosis not present

## 2019-10-14 DIAGNOSIS — N186 End stage renal disease: Secondary | ICD-10-CM | POA: Diagnosis not present

## 2019-10-14 DIAGNOSIS — D508 Other iron deficiency anemias: Secondary | ICD-10-CM | POA: Diagnosis not present

## 2019-10-14 DIAGNOSIS — Z992 Dependence on renal dialysis: Secondary | ICD-10-CM | POA: Diagnosis not present

## 2019-10-14 DIAGNOSIS — D689 Coagulation defect, unspecified: Secondary | ICD-10-CM | POA: Diagnosis not present

## 2019-10-17 DIAGNOSIS — N2581 Secondary hyperparathyroidism of renal origin: Secondary | ICD-10-CM | POA: Diagnosis not present

## 2019-10-17 DIAGNOSIS — D689 Coagulation defect, unspecified: Secondary | ICD-10-CM | POA: Diagnosis not present

## 2019-10-17 DIAGNOSIS — Z992 Dependence on renal dialysis: Secondary | ICD-10-CM | POA: Diagnosis not present

## 2019-10-17 DIAGNOSIS — D631 Anemia in chronic kidney disease: Secondary | ICD-10-CM | POA: Diagnosis not present

## 2019-10-17 DIAGNOSIS — D508 Other iron deficiency anemias: Secondary | ICD-10-CM | POA: Diagnosis not present

## 2019-10-17 DIAGNOSIS — N186 End stage renal disease: Secondary | ICD-10-CM | POA: Diagnosis not present

## 2019-10-18 ENCOUNTER — Encounter: Payer: Self-pay | Admitting: Physical Therapy

## 2019-10-18 ENCOUNTER — Ambulatory Visit: Payer: Medicare Other | Attending: Nephrology | Admitting: Physical Therapy

## 2019-10-18 ENCOUNTER — Encounter: Payer: Self-pay | Admitting: Occupational Therapy

## 2019-10-18 ENCOUNTER — Other Ambulatory Visit: Payer: Self-pay

## 2019-10-18 ENCOUNTER — Ambulatory Visit: Payer: Medicare Other | Admitting: Occupational Therapy

## 2019-10-18 DIAGNOSIS — M25641 Stiffness of right hand, not elsewhere classified: Secondary | ICD-10-CM | POA: Diagnosis not present

## 2019-10-18 DIAGNOSIS — R2681 Unsteadiness on feet: Secondary | ICD-10-CM | POA: Diagnosis not present

## 2019-10-18 DIAGNOSIS — M25642 Stiffness of left hand, not elsewhere classified: Secondary | ICD-10-CM | POA: Diagnosis not present

## 2019-10-18 DIAGNOSIS — M79641 Pain in right hand: Secondary | ICD-10-CM | POA: Insufficient documentation

## 2019-10-18 DIAGNOSIS — M6281 Muscle weakness (generalized): Secondary | ICD-10-CM

## 2019-10-18 DIAGNOSIS — M79642 Pain in left hand: Secondary | ICD-10-CM | POA: Insufficient documentation

## 2019-10-18 NOTE — Therapy (Signed)
[place] MAIN [facility] SERVICES [address] [place], [place], [address] Phone: [phone]   Fax:  [phone]  Physical Therapy Evaluation  Patient Details  Name: Kimberly Frost MRN: [id number] Date of Birth: Dec 14, 1950 Referring Provider (PT): Elmarie Shiley   Encounter Date: 10/18/2019   PT End of Session - 10/18/19 0946    Visit Number 1    Number of Visits 9    Date for PT Re-Evaluation 12/13/19    PT Start Time 0845    PT Stop Time 0930    PT Time Calculation (min) 45 min    Equipment Utilized During Treatment Gait belt    Activity Tolerance Patient tolerated treatment well    Behavior During Therapy [facility] for tasks assessed/performed           Past Medical History:  Diagnosis Date  . Chronic kidney disease   . Edema    RLE  . Hepatitis    Hep C  . History of blood transfusion   . Hypertension   . Wears dentures     Past Surgical History:  Procedure Laterality Date  . A/V FISTULAGRAM Left 04/11/2019   Procedure: A/V FISTULAGRAM;  Surgeon: Katha Cabal, MD;  Location: [place] [facility];  Service: Cardiovascular;  Laterality: Left;  . A/V FISTULAGRAM Left 05/01/2019   Procedure: A/V FISTULAGRAM;  Surgeon: Algernon Huxley, MD;  Location: [place] [facility];  Service: Cardiovascular;  Laterality: Left;  . AV FISTULA PLACEMENT Left 06/16/2018   Procedure: Left Arm ARTERIOVENOUS (AV) FISTULA CREATION;  Surgeon: Marty Heck, MD;  Location: [place];  Service: Vascular;  Laterality: Left;  Marland Kitchen MULTIPLE TOOTH EXTRACTIONS    . MYOMECTOMY      There were no vitals filed for this visit.    Subjective Assessment - 10/18/19 0858    Subjective Patient does not think she needs the PT at this time because she is not having the symptoms that she had 3 months ago including passing out due to BP regulation.    Pertinent History [name] has been having dyalisis over a year and was having some problems wiht passing out due to BP  regulation and was using a RW . Now she is not having the passing out symptoms. She has good days and bad days at the hospital. She is trying to get a kidney and the nurse thought she needed to have PT to have her use a RW. She is currently not using a RW. If she feels weak after dyalisis she uses a scooter at the store if she needs it.              [facility] PT Assessment - 10/18/19 0903      Assessment   Medical Diagnosis gait instability    Referring Provider (PT) Elmarie Shiley    Onset Date/Surgical Date 06/07/19    Hand Dominance Right      Precautions   Precautions Fall      Restrictions   Weight Bearing Restrictions No      Balance Screen   Has the patient fallen in the past 6 months Yes    How many times? 3    Has the patient had a decrease in activity level because of a fear of falling?  Yes    Is the patient reluctant to leave their home because of a fear of falling?  No      Home Ecologist residence  Living Arrangements Spouse/significant other    Available Help at Discharge Friend(s)    Type of [place] to enter    Entrance Stairs-Number of Steps 1    [place] One level    [place] - 2 wheels;Cane - single point;Crutches;Shower seat      Prior Function   Level of Independence Independent with basic ADLs;Independent with household mobility without device    Leisure likes to cook and clean around the house -use to work long hrs       Cognition   Overall Cognitive Status Within Functional Limits for tasks assessed            PAIN:No pain  POSTURE: WNL   PROM/AROM: PROM BUE: WNL AROM BUE  WNL PROM BLE  WNL AROM BLE:  WNL  STRENGTH:  Graded on a 0-5 scale Muscle Group Left Right                          Hip Flex 3/5 3/5  Hip Abd -3/5 4/5      Hip Ext 3/5 3/5      Knee Flex /5 /5  Knee Ext /5 /5  Ankle DF /5 /5  Ankle PF /5 /5   SENSATION:   BUE : WNL BLE : WNL  NEUROLOGICAL SCREEN: (2+ unless otherwise noted.) N=normal  Ab=abnormal   Level Dermatome R L  C3 Anterior Neck  N N  C4 Top of Shoulder N N  C5 Lateral Upper Arm  N N  C6 Lateral Arm/ Thumb  N N  C7 Middle Finger  N N  C8 4th & 5th Finger N N  T1 Medial Arm N N  L2 Medial thigh/groin N N  L3 Lower thigh/med.knee N N  L4 Medial leg/lat thigh N N  L5 Lat. leg & dorsal foot N N  S1 post/lat foot/thigh/leg N N  S2 Post./med. thigh & leg N N    SOMATOSENSORY:  Any N & T in extremities or weakness: reports :         Sensation           Intact      Diminished         Absent  Light touch LEs                                   FUNCTIONAL MOBILITY: independent with bed mobility Transfers sit to stand independent   BALANCE: Standing Dynamic Balance  Normal Stand independently unsupported, able to weight shift and cross midline maximally   Good Stand independently unsupported, able to weight shift and cross midline moderately x  Good-/Fair+ Stand independently unsupported, able to weight shift across midline minimally   Fair Stand independently unsupported, weight shift, and reach ipsilaterally, loss of balance when crossing midline   Poor+ Able to stand with Min A and reach ipsilaterally, unable to weight shift   Poor Able to stand with Mod A and minimally reach ipsilaterally, unable to cross midline.       GAIT: Patient ambulates with good speed and no AD and no path deviations   OUTCOME MEASURES: TEST Outcome Interpretation  5 times sit<>stand 12.77sec >39 yo, >15 sec indicates increased risk for falls  10 meter walk test      1.29  m/s <1.0 m/s indicates increased risk for falls; limited community ambulator  Timed up and Go      7.55           sec <14 sec indicates increased risk for falls  6 minute walk test   1170             Feet 1000 feet is community ambulator             Treatment: Hip abd sidelying x 10 BLE Hip ext prone  x 10 BLE Patient performed with instruction, verbal cues, tactile cues of therapist: goal: increase tissue extensibility, promote proper posture, improve mobility        Objective measurements completed on examination: See above findings.               PT Education - 10/18/19 0856    Education Details Plan of care    Person(s) Educated Patient    Methods Explanation    Comprehension Verbalized understanding            PT Short Term Goals - 10/18/19 0949      PT SHORT TERM GOAL #1   Title Patient will be independent in home exercise program to improve strength/mobility for better functional independence with ADLs.    Time 4    Period Weeks    Status New    Target Date 01/10/20      PT SHORT TERM GOAL #2   Title Patient will increase BLE gross strength to 4+/5 as to improve functional strength for independent gait, increased standing tolerance and increased ADL ability.    Time 8    Period Weeks    Status New    Target Date 01/10/20      PT SHORT TERM GOAL #3   Title Patient will ascend/descend 4 stairs without rail assist independently without loss of balance to improve ability to get in/out of home.    Time 8    Period Weeks    Status New    Target Date 01/10/20                     Plan - 10/18/19 0857    Clinical Impression Statement Patient presents with decreased BLE hip  strength, decreased dynamic standing balance.  Patient's main complaint is  inability to participate in desired activities. Patient will benefit from skilled therapy in order to increase LE strength  improve her ability to participate in desired activities and decrease her falls risk.    Personal Factors and Comorbidities Comorbidity 1;Comorbidity 2;Comorbidity 3+    Comorbidities Patient has PMHx :Hyperparathyroidism, HTN Hep C: Anemia ESRD: She gets dialysis 3 x week. She follows with nephrology. She is currently waiting to be listed at [facility] for a kidney transplant.     Stability/Clinical Decision Making Stable/Uncomplicated    Clinical Decision Making Low    Rehab Potential Good    PT Frequency 1x / week    PT Duration 8 weeks           Patient will benefit from skilled therapeutic intervention in order to improve the following deficits and impairments:  Abnormal gait, Decreased activity tolerance, Difficulty walking  Visit Diagnosis: Muscle weakness (generalized)  Gait instability     Problem List Patient Active Problem List   Diagnosis Date Noted  . A-V fistula ([place]) 04/26/2019  . ESRD (end stage renal disease) ([place]) 10/26/2018  . Secondary hyperparathyroidism of renal origin ([place]) 09/06/2018  . Anemia  due to stage 5 chronic kidney disease ([place]) 02/26/2018  . Hepatitis C 02/15/2013  . Essential hypertension, benign 12/26/2008    Alanson Puls, [place] DPT 10/18/2019, 10:12 AM  [place] MAIN [facility] SERVICES [address] [place], [place], [address] Phone: [phone]   Fax:  [phone]  Name: Kimberly Frost MRN: [id number] Date of Birth: [birth year]

## 2019-10-18 NOTE — Therapy (Signed)
[place] MAIN [facility] SERVICES [address] [place], [place], [address] Phone: [phone]   Fax:  [phone]  Occupational Therapy Evaluation  Patient Details  Name: Kimberly Frost MRN: [id number] Date of Birth: 1950/09/03 Referring Provider (OT): Dr. Posey Pronto   Encounter Date: 10/18/2019   OT End of Session - 10/18/19 1112    Visit Number 2    Number of Visits 8    Date for OT Re-Evaluation 03/17/19    OT Start Time 0945    OT Stop Time 1100    OT Time Calculation (min) 75 min    Activity Tolerance Patient tolerated treatment well    Behavior During Therapy [facility] for tasks assessed/performed           Past Medical History:  Diagnosis Date  . Chronic kidney disease   . Edema    RLE  . Hepatitis    Hep C  . History of blood transfusion   . Hypertension   . Wears dentures     Past Surgical History:  Procedure Laterality Date  . A/V FISTULAGRAM Left 04/11/2019   Procedure: A/V FISTULAGRAM;  Surgeon: Katha Cabal, MD;  Location: [place] [facility];  Service: Cardiovascular;  Laterality: Left;  . A/V FISTULAGRAM Left 05/01/2019   Procedure: A/V FISTULAGRAM;  Surgeon: Algernon Huxley, MD;  Location: [place] [facility];  Service: Cardiovascular;  Laterality: Left;  . AV FISTULA PLACEMENT Left 06/16/2018   Procedure: Left Arm ARTERIOVENOUS (AV) FISTULA CREATION;  Surgeon: Marty Heck, MD;  Location: [place];  Service: Vascular;  Laterality: Left;  Marland Kitchen MULTIPLE TOOTH EXTRACTIONS    . MYOMECTOMY      There were no vitals filed for this visit.   Subjective Assessment - 10/18/19 1120    Subjective  Pt. reports that she talks with the social worker at the kidney/dialysy cented when she is feeling depressed.    Pertinent History Pt. is a 69 y.o. female who was diagnosed with End Stage Renal Disease, and has been on Dialysis since August 2020. Pt. is scheduled for dialysis Tuesday, Thursday, and Saturday.Pt. is pending Kidney  Transplant. PMHx positive for Hepatitics C.    Patient Stated Goals To feel better.    Currently in Pain? No/denies             [facility] OT Assessment - 10/18/19 0950      Assessment   Medical Diagnosis Weakness    Referring Provider (OT) Dr. Posey Pronto    Onset Date/Surgical Date 06/07/19    Hand Dominance Right      Precautions   Precautions Fall      Restrictions   Weight Bearing Restrictions No      Balance Screen   Has the patient fallen in the past 6 months Yes    How many times? 3    Has the patient had a decrease in activity level because of a fear of falling?  Yes    Is the patient reluctant to leave their home because of a fear of falling?  Yes      Home  Environment   Family/patient expects to be discharged to: Private residence    Living Arrangements Spouse/significant other    Available Help at Discharge Family    Type of [place] Access Level entry    [place] One level    Bathroom Shower/Tub Tub/Shower unit    Shower/tub characteristics Radiographer, therapeutic  Yes    How accessible Accessible via walker    [place] - 2 wheels;Shower seat;Hand held shower head    Lives With Significant other      Prior Function   Level of Independence Independent    Vocation Retired    [place] watching TV, reading      ADL   Eating/Feeding Modified independent   needs large handled utensils   Grooming Independent   wringing out the bathcloth   Upper Body Bathing Modified independent    Lower Body Bathing Independent    Upper Body Dressing Independent    Lower Geophysicist/field seismologist - Counselling psychologist Independent      IADL   Prior Level of Function Shopping Independent   Independent   Shopping Takes care of all shopping needs independently    Prior Level of Function Light Housekeeping Indpendence    Light  Housekeeping Does personal laundry completely;Performs light daily tasks such as dishwashing, bed making    Prior Level of Function Meal Prep Independent    Meal Prep Able to complete simple warm meal prep    Prior Level of Function Scientist, research (physical sciences) Relies on family or friends for transportation    Prior Level of Function Medication Managment Independent    Medication Management Is responsible for taking medication in correct dosages at correct time    Prior Level of Function Therapist, sports financial matters independently (budgets, writes checks, pays rent, bills goes to bank), collects and keeps track of income      Mobility   Mobility Status History of falls      Written Expression   Dominant Hand Right    Handwriting 100% legible    Written Experience Within Functional Limits      Vision - History   Baseline Vision No visual deficits    Patient Visual Report --   Glasses distance, reading     Activity Tolerance   Activity Tolerance Tolerates 30 min activity with multiple rests      Cognition   Overall Cognitive Status Within Functional Limits for tasks assessed      Sensation   Light Touch Appears Intact    Proprioception Appears Intact      Coordination   Gross Motor Movements are Fluid and Coordinated Yes    Fine Motor Movements are Fluid and Coordinated Yes    Right 9 Hole Peg Test 24    Left 9 Hole Peg Test 28      ROM / Strength   AROM / PROM / Strength AROM;Strength      AROM   Overall AROM Comments WFL      Strength   Overall Strength Comments WFL      Hand Function   Right Hand Grip (lbs) 20    Right Hand Lateral Pinch 12 lbs    Right Hand 3 Point Pinch 11 lbs    Left Hand Grip (lbs) 23    Left Hand Lateral Pinch 10 lbs    Left 3 point pinch 9 lbs                           OT Education - 10/18/19 1112    Education Details findings of eval and HEP  Person(s) Educated Patient    Methods Explanation;Demonstration;Tactile cues;Verbal cues;Handout    Comprehension Verbal cues required;Returned demonstration;Verbalized understanding            OT Short Term Goals - 02/17/19 1413      OT SHORT TERM GOAL #1   Title Pt to be independent in HEP to increase flexion of digits to touch palm to hold brush    Baseline see flowsheet for AROM -and no knowledge on HEP    Time 2    Period Weeks    Status New    Target Date 03/03/19                Plan - 10/18/19 1113    Clinical Impression Statement Pt. is a 69 y.o female who  has end stage renal disease. Pt. pressents with a history of arthritis in the right hand, and has received OT 8 months ago through the hand therapy clinic. Pt. reports independence with ADLs, however does have assistance from her fiance for donning her shirt on the days that she has stomach, and body cramping from dialysis. Pt. is able to perform light home making tasks, and  all of her laundry. Pt. is able to prepare light meals, and uses utenslis with larger handles. Pt. was provided with adaptive pen, and pencil grips, as well as Dycem to stabilize items on the surface while opening. Pt. education was provided about adaptive utensil/items with a visual handout provided to the pt. FOTO score: 72, typical score: 72. No further OT skilled services are warranted at this time.    OT Occupational Profile and History Problem Focused Assessment - Including review of records relating to presenting problem    Occupational performance deficits (Please refer to evaluation for details): ADL's;IADL's;Work;Play;Leisure;Social Participation    Body Structure / Function / Physical Skills ADL;Decreased knowledge of use of DME;FMC;Flexibility;ROM;UE functional use;Dexterity;Pain;Strength;IADL    Rehab Potential Good    Clinical Decision Making Several treatment options, min-mod task modification necessary    Comorbidities Affecting  Occupational Performance: May have comorbidities impacting occupational performance    Modification or Assistance to Complete Evaluation  Min-Moderate modification of tasks or assist with assess necessary to complete eval    OT Treatment/Interventions --           Patient will benefit from skilled therapeutic intervention in order to improve the following deficits and impairments:   Body Structure / Function / Physical Skills: ADL, Decreased knowledge of use of DME, FMC, Flexibility, ROM, UE functional use, Dexterity, Pain, Strength, IADL       Visit Diagnosis: Muscle weakness (generalized) - Plan: Ot plan of care cert/re-cert    Problem List Patient Active Problem List   Diagnosis Date Noted  . A-V fistula ([place]) 04/26/2019  . ESRD (end stage renal disease) ([place]) 10/26/2018  . Secondary hyperparathyroidism of renal origin ([place]) 09/06/2018  . Anemia due to stage 5 chronic kidney disease ([place]) 02/26/2018  . Hepatitis C 02/15/2013  . Essential hypertension, benign 12/26/2008    Harrel Carina, MS, OTR/L 10/18/2019, 11:59 AM  [place] MAIN [facility] SERVICES [address] [place], [place], [address] Phone: [phone]   Fax:  [phone]  Name: Kimberly Frost MRN: [id number] Date of Birth: 1950-07-15

## 2019-10-19 DIAGNOSIS — D508 Other iron deficiency anemias: Secondary | ICD-10-CM | POA: Diagnosis not present

## 2019-10-19 DIAGNOSIS — N2581 Secondary hyperparathyroidism of renal origin: Secondary | ICD-10-CM | POA: Diagnosis not present

## 2019-10-19 DIAGNOSIS — N186 End stage renal disease: Secondary | ICD-10-CM | POA: Diagnosis not present

## 2019-10-19 DIAGNOSIS — D689 Coagulation defect, unspecified: Secondary | ICD-10-CM | POA: Diagnosis not present

## 2019-10-19 DIAGNOSIS — D631 Anemia in chronic kidney disease: Secondary | ICD-10-CM | POA: Diagnosis not present

## 2019-10-19 DIAGNOSIS — Z992 Dependence on renal dialysis: Secondary | ICD-10-CM | POA: Diagnosis not present

## 2019-10-21 DIAGNOSIS — D508 Other iron deficiency anemias: Secondary | ICD-10-CM | POA: Diagnosis not present

## 2019-10-21 DIAGNOSIS — N2581 Secondary hyperparathyroidism of renal origin: Secondary | ICD-10-CM | POA: Diagnosis not present

## 2019-10-21 DIAGNOSIS — D631 Anemia in chronic kidney disease: Secondary | ICD-10-CM | POA: Diagnosis not present

## 2019-10-21 DIAGNOSIS — D689 Coagulation defect, unspecified: Secondary | ICD-10-CM | POA: Diagnosis not present

## 2019-10-21 DIAGNOSIS — Z992 Dependence on renal dialysis: Secondary | ICD-10-CM | POA: Diagnosis not present

## 2019-10-21 DIAGNOSIS — N186 End stage renal disease: Secondary | ICD-10-CM | POA: Diagnosis not present

## 2019-10-23 ENCOUNTER — Ambulatory Visit: Payer: Medicare Other | Admitting: Occupational Therapy

## 2019-10-23 ENCOUNTER — Ambulatory Visit: Payer: Medicare Other | Admitting: Physical Therapy

## 2019-10-23 ENCOUNTER — Telehealth: Payer: Self-pay

## 2019-10-23 NOTE — Telephone Encounter (Signed)
[name], NP  09/19/2019 11:22 AM EDT     D/w Dr. Posey Pronto. He gave the go ahead for Prolia. Ashtyn, can we get this patient started?   Lurlean Nanny, CMA  09/15/2019 4:12 PM EDT     Letter mailed to pt   Lurlean Nanny, CMA  09/15/2019 4:11 PM EDT     [place], [facility] [phone]    Lurlean Nanny, CMA  08/29/2019 1:59 PM EDT     Called pt, no answer will try again later   [name], NP  08/24/2019 9:55 AM EDT     She has osteoporosis. Would recommend starting Prolia but I need clearance from her nephrologist first. I need contact info for her nephrologist.   Submitted benefits on [employer] today.

## 2019-10-24 DIAGNOSIS — D508 Other iron deficiency anemias: Secondary | ICD-10-CM | POA: Diagnosis not present

## 2019-10-24 DIAGNOSIS — Z992 Dependence on renal dialysis: Secondary | ICD-10-CM | POA: Diagnosis not present

## 2019-10-24 DIAGNOSIS — N2581 Secondary hyperparathyroidism of renal origin: Secondary | ICD-10-CM | POA: Diagnosis not present

## 2019-10-24 DIAGNOSIS — D689 Coagulation defect, unspecified: Secondary | ICD-10-CM | POA: Diagnosis not present

## 2019-10-24 DIAGNOSIS — N186 End stage renal disease: Secondary | ICD-10-CM | POA: Diagnosis not present

## 2019-10-24 DIAGNOSIS — D631 Anemia in chronic kidney disease: Secondary | ICD-10-CM | POA: Diagnosis not present

## 2019-10-25 ENCOUNTER — Encounter: Payer: Medicare Other | Admitting: Occupational Therapy

## 2019-10-25 ENCOUNTER — Ambulatory Visit: Payer: Medicare Other | Admitting: Physical Therapy

## 2019-10-25 ENCOUNTER — Other Ambulatory Visit: Payer: Self-pay

## 2019-10-25 ENCOUNTER — Encounter: Payer: Self-pay | Admitting: Physical Therapy

## 2019-10-25 DIAGNOSIS — R2681 Unsteadiness on feet: Secondary | ICD-10-CM | POA: Diagnosis not present

## 2019-10-25 DIAGNOSIS — M79642 Pain in left hand: Secondary | ICD-10-CM | POA: Diagnosis not present

## 2019-10-25 DIAGNOSIS — M25641 Stiffness of right hand, not elsewhere classified: Secondary | ICD-10-CM

## 2019-10-25 DIAGNOSIS — M6281 Muscle weakness (generalized): Secondary | ICD-10-CM | POA: Diagnosis not present

## 2019-10-25 DIAGNOSIS — M79641 Pain in right hand: Secondary | ICD-10-CM

## 2019-10-25 DIAGNOSIS — M25642 Stiffness of left hand, not elsewhere classified: Secondary | ICD-10-CM

## 2019-10-25 NOTE — Therapy (Signed)
[place] MAIN [facility] SERVICES [address] [place], [place], [address] Phone: [phone]   Fax:  [phone]  Physical Therapy Treatment  Patient Details  Name: Kimberly Frost MRN: [id number] Date of Birth: 1950/11/23 Referring Provider (PT): Elmarie Shiley   Encounter Date: 10/25/2019    Past Medical History:  Diagnosis Date  . Chronic kidney disease   . Edema    RLE  . Hepatitis    Hep C  . History of blood transfusion   . Hypertension   . Wears dentures     Past Surgical History:  Procedure Laterality Date  . A/V FISTULAGRAM Left 04/11/2019   Procedure: A/V FISTULAGRAM;  Surgeon: Katha Cabal, MD;  Location: [place] [facility];  Service: Cardiovascular;  Laterality: Left;  . A/V FISTULAGRAM Left 05/01/2019   Procedure: A/V FISTULAGRAM;  Surgeon: Algernon Huxley, MD;  Location: [place] [facility];  Service: Cardiovascular;  Laterality: Left;  . AV FISTULA PLACEMENT Left 06/16/2018   Procedure: Left Arm ARTERIOVENOUS (AV) FISTULA CREATION;  Surgeon: Marty Heck, MD;  Location: [place];  Service: Vascular;  Laterality: Left;  Marland Kitchen MULTIPLE TOOTH EXTRACTIONS    . MYOMECTOMY      There were no vitals filed for this visit.   Subjective Assessment - 10/25/19 0858    Subjective Patient does not think she needs the PT at this time because she is not having the symptoms that she had 3 months ago including passing out due to BP regulation.    Pertinent History [name] has been having dyalisis over a year and was having some problems wiht passing out due to BP regulation and was using a RW . Now she is not having the passing out symptoms. She has good days and bad days at the hospital. She is trying to get a kidney and the nurse thought she needed to have PT to have her use a RW. She is currently not using a RW. If she feels weak after dyalisis she uses a scooter at the store if she needs it.    Currently in Pain? No/denies    Pain  Score 0-No pain    Pain Onset More than a month ago             Therapeutic exercise: Octane fitness x 5 mins L 4  Supine: SLR x 15 BLE Hookling marching x 15  Hooklying abd/ER x 15  Bridging x 15 SAQ x 15 BLE Hip abd/add x 15, BLE Heel slides x 15, BLE  Sidelying: Hip abd x 15 , BLE Calm x 15 , BLE  Prone: Knee flex x 15 BLE Knee flex and hip ext x 15 , BLE   Leg press x 25 lbs x 20 reps Patient performed with instruction, verbal cues, tactile cues of therapist: goal: increase tissue extensibility, promote proper posture, improve mobility                         PT Education - 10/25/19 0859    Education Details Plan of care    Person(s) Educated Patient    Methods Explanation    Comprehension Verbalized understanding            PT Short Term Goals - 10/25/19 0859      PT SHORT TERM GOAL #1   Title Patient will be independent in home exercise program to improve strength/mobility for better functional independence with ADLs.  Time 4    Period Weeks    Status New    Target Date 01/10/20      PT SHORT TERM GOAL #2   Title Patient will increase BLE gross strength to 4+/5 as to improve functional strength for independent gait, increased standing tolerance and increased ADL ability.    Time 8    Period Weeks    Status New    Target Date 01/10/20      PT SHORT TERM GOAL #3   Title Patient will ascend/descend 4 stairs without rail assist independently without loss of balance to improve ability to get in/out of home.    Time 8    Period Weeks    Status New    Target Date 01/10/20                    Plan - 10/25/19 0859    Clinical Impression Statement Patient instructed in beginning balance and coordination exercise. Patient required mod VCs and min A for correct position and form,  and speed of exercises. Patients would benefit from additional skilled PT intervention to improve balance/gait safety and reduce fall risk.     Personal Factors and Comorbidities Comorbidity 1;Comorbidity 2;Comorbidity 3+    Comorbidities Patient has PMHx :Hyperparathyroidism, HTN Hep C: Anemia ESRD: She gets dialysis 3 x week. She follows with nephrology. She is currently waiting to be listed at [facility] for a kidney transplant.    Stability/Clinical Decision Making Stable/Uncomplicated    Rehab Potential Good    PT Frequency 1x / week    PT Duration 8 weeks           Patient will benefit from skilled therapeutic intervention in order to improve the following deficits and impairments:  Abnormal gait, Decreased activity tolerance, Difficulty walking  Visit Diagnosis: Muscle weakness (generalized)  Gait instability  Stiffness of left hand, not elsewhere classified  Stiffness of right hand, not elsewhere classified  Pain in left hand  Pain in right hand     Problem List Patient Active Problem List   Diagnosis Date Noted  . A-V fistula ([place]) 04/26/2019  . ESRD (end stage renal disease) ([place]) 10/26/2018  . Secondary hyperparathyroidism of renal origin ([place]) 09/06/2018  . Anemia due to stage 5 chronic kidney disease ([place]) 02/26/2018  . Hepatitis C 02/15/2013  . Essential hypertension, benign 12/26/2008    Alanson Puls, [place] DPT 10/25/2019, 9:20 AM  [place] MAIN [facility] SERVICES [address] [place], [place], [address] Phone: [phone]   Fax:  [phone]  Name: Kimberly Frost MRN: [id number] Date of Birth: 01-31-1951

## 2019-10-26 DIAGNOSIS — D689 Coagulation defect, unspecified: Secondary | ICD-10-CM | POA: Diagnosis not present

## 2019-10-26 DIAGNOSIS — D508 Other iron deficiency anemias: Secondary | ICD-10-CM | POA: Diagnosis not present

## 2019-10-26 DIAGNOSIS — N186 End stage renal disease: Secondary | ICD-10-CM | POA: Diagnosis not present

## 2019-10-26 DIAGNOSIS — D631 Anemia in chronic kidney disease: Secondary | ICD-10-CM | POA: Diagnosis not present

## 2019-10-26 DIAGNOSIS — Z992 Dependence on renal dialysis: Secondary | ICD-10-CM | POA: Diagnosis not present

## 2019-10-26 DIAGNOSIS — N2581 Secondary hyperparathyroidism of renal origin: Secondary | ICD-10-CM | POA: Diagnosis not present

## 2019-10-28 DIAGNOSIS — D689 Coagulation defect, unspecified: Secondary | ICD-10-CM | POA: Diagnosis not present

## 2019-10-28 DIAGNOSIS — N186 End stage renal disease: Secondary | ICD-10-CM | POA: Diagnosis not present

## 2019-10-28 DIAGNOSIS — D631 Anemia in chronic kidney disease: Secondary | ICD-10-CM | POA: Diagnosis not present

## 2019-10-28 DIAGNOSIS — N2581 Secondary hyperparathyroidism of renal origin: Secondary | ICD-10-CM | POA: Diagnosis not present

## 2019-10-28 DIAGNOSIS — D508 Other iron deficiency anemias: Secondary | ICD-10-CM | POA: Diagnosis not present

## 2019-10-28 DIAGNOSIS — Z992 Dependence on renal dialysis: Secondary | ICD-10-CM | POA: Diagnosis not present

## 2019-10-30 ENCOUNTER — Encounter: Payer: Medicare Other | Admitting: Occupational Therapy

## 2019-10-30 ENCOUNTER — Ambulatory Visit: Payer: Medicare Other | Admitting: Physical Therapy

## 2019-10-31 DIAGNOSIS — D689 Coagulation defect, unspecified: Secondary | ICD-10-CM | POA: Diagnosis not present

## 2019-10-31 DIAGNOSIS — D631 Anemia in chronic kidney disease: Secondary | ICD-10-CM | POA: Diagnosis not present

## 2019-10-31 DIAGNOSIS — Z992 Dependence on renal dialysis: Secondary | ICD-10-CM | POA: Diagnosis not present

## 2019-10-31 DIAGNOSIS — D508 Other iron deficiency anemias: Secondary | ICD-10-CM | POA: Diagnosis not present

## 2019-10-31 DIAGNOSIS — N186 End stage renal disease: Secondary | ICD-10-CM | POA: Diagnosis not present

## 2019-10-31 DIAGNOSIS — N2581 Secondary hyperparathyroidism of renal origin: Secondary | ICD-10-CM | POA: Diagnosis not present

## 2019-11-01 ENCOUNTER — Telehealth: Payer: Self-pay | Admitting: Radiology

## 2019-11-01 ENCOUNTER — Other Ambulatory Visit: Payer: Self-pay

## 2019-11-01 ENCOUNTER — Other Ambulatory Visit (INDEPENDENT_AMBULATORY_CARE_PROVIDER_SITE_OTHER): Payer: Medicare Other

## 2019-11-01 DIAGNOSIS — E7800 Pure hypercholesterolemia, unspecified: Secondary | ICD-10-CM

## 2019-11-01 DIAGNOSIS — I10 Essential (primary) hypertension: Secondary | ICD-10-CM

## 2019-11-01 LAB — COMPREHENSIVE METABOLIC PANEL
ALT: 18 U/L (ref 0–35)
AST: 22 U/L (ref 0–37)
Albumin: 4.2 g/dL (ref 3.5–5.2)
Alkaline Phosphatase: 77 U/L (ref 39–117)
BUN: 27 mg/dL — ABNORMAL HIGH (ref 6–23)
CO2: 33 mEq/L — ABNORMAL HIGH (ref 19–32)
Calcium: 9.9 mg/dL (ref 8.4–10.5)
Chloride: 91 mEq/L — ABNORMAL LOW (ref 96–112)
Creatinine, Ser: 8.2 mg/dL (ref 0.40–1.20)
GFR: 5.87 mL/min — CL (ref >60.00)
Glucose, Bld: 158 mg/dL — ABNORMAL HIGH (ref 70–99)
Potassium: 4.3 mEq/L (ref 3.5–5.1)
Sodium: 137 mEq/L (ref 135–145)
Total Bilirubin: 0.5 mg/dL (ref 0.2–1.2)
Total Protein: 8 g/dL (ref 6.0–8.3)

## 2019-11-01 LAB — LIPID PANEL
Cholesterol: 223 mg/dL — ABNORMAL HIGH (ref 0–200)
HDL: 50.1 mg/dL (ref >39.00)
LDL Cholesterol: 154 mg/dL — ABNORMAL HIGH (ref 0–99)
NonHDL: 173.07
Total CHOL/HDL Ratio: 4
Triglycerides: 94 mg/dL (ref 0.0–149.0)
VLDL: 18.8 mg/dL (ref 0.0–40.0)

## 2019-11-01 NOTE — Telephone Encounter (Signed)
Known ESRD

## 2019-11-01 NOTE — Telephone Encounter (Signed)
[facility] called critical results, Creat - 8.20, GRF - 5.87. Results given to [facility]

## 2019-11-02 ENCOUNTER — Other Ambulatory Visit: Payer: Self-pay | Admitting: Internal Medicine

## 2019-11-02 DIAGNOSIS — N2581 Secondary hyperparathyroidism of renal origin: Secondary | ICD-10-CM | POA: Diagnosis not present

## 2019-11-02 DIAGNOSIS — D508 Other iron deficiency anemias: Secondary | ICD-10-CM | POA: Diagnosis not present

## 2019-11-02 DIAGNOSIS — D631 Anemia in chronic kidney disease: Secondary | ICD-10-CM | POA: Diagnosis not present

## 2019-11-02 DIAGNOSIS — Z992 Dependence on renal dialysis: Secondary | ICD-10-CM | POA: Diagnosis not present

## 2019-11-02 DIAGNOSIS — D689 Coagulation defect, unspecified: Secondary | ICD-10-CM | POA: Diagnosis not present

## 2019-11-02 DIAGNOSIS — E7800 Pure hypercholesterolemia, unspecified: Secondary | ICD-10-CM

## 2019-11-02 DIAGNOSIS — N186 End stage renal disease: Secondary | ICD-10-CM | POA: Diagnosis not present

## 2019-11-02 DIAGNOSIS — I129 Hypertensive chronic kidney disease with stage 1 through stage 4 chronic kidney disease, or unspecified chronic kidney disease: Secondary | ICD-10-CM | POA: Diagnosis not present

## 2019-11-03 ENCOUNTER — Other Ambulatory Visit: Payer: Medicare Other

## 2019-11-04 DIAGNOSIS — D508 Other iron deficiency anemias: Secondary | ICD-10-CM | POA: Diagnosis not present

## 2019-11-04 DIAGNOSIS — N186 End stage renal disease: Secondary | ICD-10-CM | POA: Diagnosis not present

## 2019-11-04 DIAGNOSIS — Z992 Dependence on renal dialysis: Secondary | ICD-10-CM | POA: Diagnosis not present

## 2019-11-04 DIAGNOSIS — N2581 Secondary hyperparathyroidism of renal origin: Secondary | ICD-10-CM | POA: Diagnosis not present

## 2019-11-04 DIAGNOSIS — Z23 Encounter for immunization: Secondary | ICD-10-CM | POA: Diagnosis not present

## 2019-11-04 DIAGNOSIS — D689 Coagulation defect, unspecified: Secondary | ICD-10-CM | POA: Diagnosis not present

## 2019-11-04 DIAGNOSIS — D509 Iron deficiency anemia, unspecified: Secondary | ICD-10-CM | POA: Diagnosis not present

## 2019-11-06 ENCOUNTER — Encounter: Payer: Medicare Other | Admitting: Occupational Therapy

## 2019-11-06 ENCOUNTER — Ambulatory Visit: Payer: Medicare Other | Admitting: Physical Therapy

## 2019-11-07 DIAGNOSIS — D508 Other iron deficiency anemias: Secondary | ICD-10-CM | POA: Diagnosis not present

## 2019-11-07 DIAGNOSIS — N186 End stage renal disease: Secondary | ICD-10-CM | POA: Diagnosis not present

## 2019-11-07 DIAGNOSIS — Z23 Encounter for immunization: Secondary | ICD-10-CM | POA: Diagnosis not present

## 2019-11-07 DIAGNOSIS — N2581 Secondary hyperparathyroidism of renal origin: Secondary | ICD-10-CM | POA: Diagnosis not present

## 2019-11-07 DIAGNOSIS — D689 Coagulation defect, unspecified: Secondary | ICD-10-CM | POA: Diagnosis not present

## 2019-11-07 DIAGNOSIS — D509 Iron deficiency anemia, unspecified: Secondary | ICD-10-CM | POA: Diagnosis not present

## 2019-11-07 DIAGNOSIS — Z992 Dependence on renal dialysis: Secondary | ICD-10-CM | POA: Diagnosis not present

## 2019-11-07 MED ORDER — ATORVASTATIN CALCIUM 20 MG PO TABS
20.0000 mg | ORAL_TABLET | Freq: Every day | ORAL | 2 refills | Status: DC
Start: 2019-11-07 — End: 2020-04-04

## 2019-11-08 ENCOUNTER — Ambulatory Visit: Payer: Medicare Other | Admitting: Physical Therapy

## 2019-11-08 ENCOUNTER — Encounter: Payer: Medicare Other | Admitting: Occupational Therapy

## 2019-11-09 ENCOUNTER — Telehealth: Payer: Self-pay | Admitting: Internal Medicine

## 2019-11-09 DIAGNOSIS — D509 Iron deficiency anemia, unspecified: Secondary | ICD-10-CM | POA: Diagnosis not present

## 2019-11-09 DIAGNOSIS — D508 Other iron deficiency anemias: Secondary | ICD-10-CM | POA: Diagnosis not present

## 2019-11-09 DIAGNOSIS — Z992 Dependence on renal dialysis: Secondary | ICD-10-CM | POA: Diagnosis not present

## 2019-11-09 DIAGNOSIS — Z23 Encounter for immunization: Secondary | ICD-10-CM | POA: Diagnosis not present

## 2019-11-09 DIAGNOSIS — N2581 Secondary hyperparathyroidism of renal origin: Secondary | ICD-10-CM | POA: Diagnosis not present

## 2019-11-09 DIAGNOSIS — D689 Coagulation defect, unspecified: Secondary | ICD-10-CM | POA: Diagnosis not present

## 2019-11-09 DIAGNOSIS — N186 End stage renal disease: Secondary | ICD-10-CM | POA: Diagnosis not present

## 2019-11-09 NOTE — Telephone Encounter (Signed)
Patient called in stating she is returning call from [facility] to discuss possible labs. Please advise.

## 2019-11-11 DIAGNOSIS — Z23 Encounter for immunization: Secondary | ICD-10-CM | POA: Diagnosis not present

## 2019-11-11 DIAGNOSIS — Z992 Dependence on renal dialysis: Secondary | ICD-10-CM | POA: Diagnosis not present

## 2019-11-11 DIAGNOSIS — D509 Iron deficiency anemia, unspecified: Secondary | ICD-10-CM | POA: Diagnosis not present

## 2019-11-11 DIAGNOSIS — D689 Coagulation defect, unspecified: Secondary | ICD-10-CM | POA: Diagnosis not present

## 2019-11-11 DIAGNOSIS — N186 End stage renal disease: Secondary | ICD-10-CM | POA: Diagnosis not present

## 2019-11-11 DIAGNOSIS — D508 Other iron deficiency anemias: Secondary | ICD-10-CM | POA: Diagnosis not present

## 2019-11-11 DIAGNOSIS — N2581 Secondary hyperparathyroidism of renal origin: Secondary | ICD-10-CM | POA: Diagnosis not present

## 2019-11-13 ENCOUNTER — Ambulatory Visit: Payer: Medicare Other | Admitting: Physical Therapy

## 2019-11-13 ENCOUNTER — Encounter: Payer: Medicare Other | Admitting: Occupational Therapy

## 2019-11-14 DIAGNOSIS — D508 Other iron deficiency anemias: Secondary | ICD-10-CM | POA: Diagnosis not present

## 2019-11-14 DIAGNOSIS — D689 Coagulation defect, unspecified: Secondary | ICD-10-CM | POA: Diagnosis not present

## 2019-11-14 DIAGNOSIS — Z23 Encounter for immunization: Secondary | ICD-10-CM | POA: Diagnosis not present

## 2019-11-14 DIAGNOSIS — Z992 Dependence on renal dialysis: Secondary | ICD-10-CM | POA: Diagnosis not present

## 2019-11-14 DIAGNOSIS — D509 Iron deficiency anemia, unspecified: Secondary | ICD-10-CM | POA: Diagnosis not present

## 2019-11-14 DIAGNOSIS — N2581 Secondary hyperparathyroidism of renal origin: Secondary | ICD-10-CM | POA: Diagnosis not present

## 2019-11-14 DIAGNOSIS — N186 End stage renal disease: Secondary | ICD-10-CM | POA: Diagnosis not present

## 2019-11-15 ENCOUNTER — Ambulatory Visit: Payer: Medicare Other | Attending: Internal Medicine | Admitting: Physical Therapy

## 2019-11-15 ENCOUNTER — Encounter: Payer: Medicare Other | Admitting: Occupational Therapy

## 2019-11-15 ENCOUNTER — Other Ambulatory Visit: Payer: Self-pay

## 2019-11-15 ENCOUNTER — Encounter: Payer: Self-pay | Admitting: Physical Therapy

## 2019-11-15 DIAGNOSIS — M79641 Pain in right hand: Secondary | ICD-10-CM | POA: Diagnosis not present

## 2019-11-15 DIAGNOSIS — M25641 Stiffness of right hand, not elsewhere classified: Secondary | ICD-10-CM

## 2019-11-15 DIAGNOSIS — M79642 Pain in left hand: Secondary | ICD-10-CM | POA: Insufficient documentation

## 2019-11-15 DIAGNOSIS — R2681 Unsteadiness on feet: Secondary | ICD-10-CM | POA: Diagnosis not present

## 2019-11-15 DIAGNOSIS — M25642 Stiffness of left hand, not elsewhere classified: Secondary | ICD-10-CM | POA: Insufficient documentation

## 2019-11-15 DIAGNOSIS — M6281 Muscle weakness (generalized): Secondary | ICD-10-CM | POA: Diagnosis not present

## 2019-11-15 NOTE — Therapy (Signed)
[place] MAIN [facility] SERVICES [address] [place], [place], [address] Phone: [phone]   Fax:  [phone]  Physical Therapy Treatment  Patient Details  Name: Kimberly Frost MRN: [id number] Date of Birth: 12/04/1950 Referring Provider (PT): Elmarie Shiley   Encounter Date: 11/15/2019   PT End of Session - 11/15/19 0805    Visit Number 3    Number of Visits 9    Date for PT Re-Evaluation 12/13/19    PT Start Time 0802    PT Stop Time 0840    PT Time Calculation (min) 38 min           Past Medical History:  Diagnosis Date  . Chronic kidney disease   . Edema    RLE  . Hepatitis    Hep C  . History of blood transfusion   . Hypertension   . Wears dentures     Past Surgical History:  Procedure Laterality Date  . A/V FISTULAGRAM Left 04/11/2019   Procedure: A/V FISTULAGRAM;  Surgeon: Katha Cabal, MD;  Location: [place] [facility];  Service: Cardiovascular;  Laterality: Left;  . A/V FISTULAGRAM Left 05/01/2019   Procedure: A/V FISTULAGRAM;  Surgeon: Algernon Huxley, MD;  Location: [place] [facility];  Service: Cardiovascular;  Laterality: Left;  . AV FISTULA PLACEMENT Left 06/16/2018   Procedure: Left Arm ARTERIOVENOUS (AV) FISTULA CREATION;  Surgeon: Marty Heck, MD;  Location: [place];  Service: Vascular;  Laterality: Left;  Marland Kitchen MULTIPLE TOOTH EXTRACTIONS    . MYOMECTOMY      There were no vitals filed for this visit.   Subjective Assessment - 11/15/19 0804    Subjective Patient is feeling tired.    Pertinent History [name] has been having dyalisis over a year and was having some problems wiht passing out due to BP regulation and was using a RW . Now she is not having the passing out symptoms. She has good days and bad days at the hospital. She is trying to get a kidney and the nurse thought she needed to have PT to have her use a RW. She is currently not using a RW. If she feels weak after dyalisis she uses a scooter  at the store if she needs it.    Currently in Pain? No/denies    Pain Score 0-No pain    Pain Onset More than a month ago            Therapeutic exercise: Octane fitness x 5 mins L 4  Supine: SLR x 15 BLE Hookling marching x 15  Hooklying abd/ER x 15  Bridging x 15 SAQ x 15 BLE Hip abd/add x 15, BLE Heel slides x 15, BLE  Sidelying: Hip abd x 15 , BLE Calm x 15 , BLE  Prone: Knee flex x 15 BLE Knee flex and hip ext x 15 , BLE   Leg press x 25 lbs x 20 reps Patient performed with instruction, verbal cues, tactile cues of therapist: goal:increase tissue extensibility, promote proper posture, improve mobility                          PT Education - 11/15/19 0805    Education Details HEP    Person(s) Educated Patient    Methods Explanation    Comprehension Verbalized understanding;Need further instruction            PT Short Term Goals - 10/25/19 0354  PT SHORT TERM GOAL #1   Title Patient will be independent in home exercise program to improve strength/mobility for better functional independence with ADLs.    Time 4    Period Weeks    Status New    Target Date 01/10/20      PT SHORT TERM GOAL #2   Title Patient will increase BLE gross strength to 4+/5 as to improve functional strength for independent gait, increased standing tolerance and increased ADL ability.    Time 8    Period Weeks    Status New    Target Date 01/10/20      PT SHORT TERM GOAL #3   Title Patient will ascend/descend 4 stairs without rail assist independently without loss of balance to improve ability to get in/out of home.    Time 8    Period Weeks    Status New    Target Date 01/10/20                    Plan - 11/15/19 0806    Clinical Impression Statement  Pt was able to perform all exercises today with CGA.Marland Kitchen Pt was able to perform all  strength exercises, demonstrating improvements in LE strength and stability.    Pt requires verbal, visual  and tactile cues during exercise in order to complete tasks with proper form and technique, as well as to stay on task.  Pt would continue to benefit from skilled PT services in order to further strengthen LE's, improve static and dynamic balance, and improve coordination in order to increase functional mobility and decrease risk of falls   Personal Factors and Comorbidities Comorbidity 1;Comorbidity 2;Comorbidity 3+    Comorbidities Patient has PMHx :Hyperparathyroidism, HTN Hep C: Anemia ESRD: She gets dialysis 3 x week. She follows with nephrology. She is currently waiting to be listed at [facility] for a kidney transplant.    Stability/Clinical Decision Making Stable/Uncomplicated    Rehab Potential Good    PT Frequency 1x / week    PT Duration 8 weeks           Patient will benefit from skilled therapeutic intervention in order to improve the following deficits and impairments:  Abnormal gait, Decreased activity tolerance, Difficulty walking  Visit Diagnosis: Muscle weakness (generalized)  Gait instability  Stiffness of left hand, not elsewhere classified  Stiffness of right hand, not elsewhere classified  Pain in left hand  Pain in right hand     Problem List Patient Active Problem List   Diagnosis Date Noted  . A-V fistula ([place]) 04/26/2019  . ESRD (end stage renal disease) ([place]) 10/26/2018  . Secondary hyperparathyroidism of renal origin ([place]) 09/06/2018  . Anemia due to stage 5 chronic kidney disease ([place]) 02/26/2018  . Hepatitis C 02/15/2013  . Essential hypertension, benign 12/26/2008    Alanson Puls, [place] DPT 11/15/2019, 8:07 AM  [place] MAIN [facility] SERVICES [address] [place], [place], [address] Phone: [phone]   Fax:  [phone]  Name: CANA MIGNANO MRN: [id number] Date of Birth: [birth year]

## 2019-11-16 ENCOUNTER — Other Ambulatory Visit: Payer: Self-pay

## 2019-11-16 DIAGNOSIS — N2581 Secondary hyperparathyroidism of renal origin: Secondary | ICD-10-CM | POA: Diagnosis not present

## 2019-11-16 DIAGNOSIS — D508 Other iron deficiency anemias: Secondary | ICD-10-CM | POA: Diagnosis not present

## 2019-11-16 DIAGNOSIS — N186 End stage renal disease: Secondary | ICD-10-CM | POA: Diagnosis not present

## 2019-11-16 DIAGNOSIS — D689 Coagulation defect, unspecified: Secondary | ICD-10-CM | POA: Diagnosis not present

## 2019-11-16 DIAGNOSIS — D509 Iron deficiency anemia, unspecified: Secondary | ICD-10-CM | POA: Diagnosis not present

## 2019-11-16 DIAGNOSIS — Z992 Dependence on renal dialysis: Secondary | ICD-10-CM | POA: Diagnosis not present

## 2019-11-16 DIAGNOSIS — M810 Age-related osteoporosis without current pathological fracture: Secondary | ICD-10-CM

## 2019-11-16 DIAGNOSIS — Z23 Encounter for immunization: Secondary | ICD-10-CM | POA: Diagnosis not present

## 2019-11-16 NOTE — Telephone Encounter (Signed)
Spoke to [place]. Pt does not need auth and out of pocket will be $0. Called patient l/m to call office to set up lab and nurse visit. Future lab has been ordered.

## 2019-11-18 DIAGNOSIS — Z992 Dependence on renal dialysis: Secondary | ICD-10-CM | POA: Diagnosis not present

## 2019-11-18 DIAGNOSIS — N2581 Secondary hyperparathyroidism of renal origin: Secondary | ICD-10-CM | POA: Diagnosis not present

## 2019-11-18 DIAGNOSIS — D508 Other iron deficiency anemias: Secondary | ICD-10-CM | POA: Diagnosis not present

## 2019-11-18 DIAGNOSIS — N186 End stage renal disease: Secondary | ICD-10-CM | POA: Diagnosis not present

## 2019-11-18 DIAGNOSIS — Z23 Encounter for immunization: Secondary | ICD-10-CM | POA: Diagnosis not present

## 2019-11-18 DIAGNOSIS — D509 Iron deficiency anemia, unspecified: Secondary | ICD-10-CM | POA: Diagnosis not present

## 2019-11-18 DIAGNOSIS — D689 Coagulation defect, unspecified: Secondary | ICD-10-CM | POA: Diagnosis not present

## 2019-11-20 ENCOUNTER — Encounter: Payer: Medicare Other | Admitting: Occupational Therapy

## 2019-11-20 ENCOUNTER — Ambulatory Visit: Payer: Medicare Other | Admitting: Physical Therapy

## 2019-11-21 DIAGNOSIS — D509 Iron deficiency anemia, unspecified: Secondary | ICD-10-CM | POA: Diagnosis not present

## 2019-11-21 DIAGNOSIS — Z992 Dependence on renal dialysis: Secondary | ICD-10-CM | POA: Diagnosis not present

## 2019-11-21 DIAGNOSIS — D508 Other iron deficiency anemias: Secondary | ICD-10-CM | POA: Diagnosis not present

## 2019-11-21 DIAGNOSIS — N2581 Secondary hyperparathyroidism of renal origin: Secondary | ICD-10-CM | POA: Diagnosis not present

## 2019-11-21 DIAGNOSIS — N186 End stage renal disease: Secondary | ICD-10-CM | POA: Diagnosis not present

## 2019-11-21 DIAGNOSIS — D689 Coagulation defect, unspecified: Secondary | ICD-10-CM | POA: Diagnosis not present

## 2019-11-21 DIAGNOSIS — Z23 Encounter for immunization: Secondary | ICD-10-CM | POA: Diagnosis not present

## 2019-11-22 ENCOUNTER — Ambulatory Visit: Payer: Medicare Other | Admitting: Physical Therapy

## 2019-11-22 ENCOUNTER — Encounter: Payer: Medicare Other | Admitting: Occupational Therapy

## 2019-11-24 ENCOUNTER — Other Ambulatory Visit: Payer: Self-pay

## 2019-11-24 DIAGNOSIS — I10 Essential (primary) hypertension: Secondary | ICD-10-CM

## 2019-11-24 DIAGNOSIS — N184 Chronic kidney disease, stage 4 (severe): Secondary | ICD-10-CM

## 2019-11-24 NOTE — Telephone Encounter (Signed)
Contacted pt regarding Prolia inj and how kidney function is assessed before pt is to receive inj. Also discussed that prolia would not cost pt any out of pocket cost.   Pt reported she is on dialysis and so her kidney function is not very good. Advised that a msg would be sent to PCP and this nurse would f/u concerning whether to proceed with prolia inj or not. Also explained lab results and provider msg to pt and scheduled pt for POCT A1C for 10/25. Pt has already picked up higher dose of atorvastatin and started taking it.

## 2019-11-24 NOTE — Telephone Encounter (Signed)
I have cleared this with her nephrologist Go ahead with Prolia injection.

## 2019-11-25 DIAGNOSIS — Z23 Encounter for immunization: Secondary | ICD-10-CM | POA: Diagnosis not present

## 2019-11-25 DIAGNOSIS — D689 Coagulation defect, unspecified: Secondary | ICD-10-CM | POA: Diagnosis not present

## 2019-11-25 DIAGNOSIS — Z992 Dependence on renal dialysis: Secondary | ICD-10-CM | POA: Diagnosis not present

## 2019-11-25 DIAGNOSIS — D508 Other iron deficiency anemias: Secondary | ICD-10-CM | POA: Diagnosis not present

## 2019-11-25 DIAGNOSIS — N186 End stage renal disease: Secondary | ICD-10-CM | POA: Diagnosis not present

## 2019-11-25 DIAGNOSIS — D509 Iron deficiency anemia, unspecified: Secondary | ICD-10-CM | POA: Diagnosis not present

## 2019-11-25 DIAGNOSIS — N2581 Secondary hyperparathyroidism of renal origin: Secondary | ICD-10-CM | POA: Diagnosis not present

## 2019-11-27 ENCOUNTER — Other Ambulatory Visit (INDEPENDENT_AMBULATORY_CARE_PROVIDER_SITE_OTHER): Payer: Medicare Other

## 2019-11-27 ENCOUNTER — Ambulatory Visit: Payer: Medicare Other | Admitting: Physical Therapy

## 2019-11-27 ENCOUNTER — Other Ambulatory Visit: Payer: Self-pay

## 2019-11-27 DIAGNOSIS — N184 Chronic kidney disease, stage 4 (severe): Secondary | ICD-10-CM

## 2019-11-27 LAB — POCT GLYCOSYLATED HEMOGLOBIN (HGB A1C): Hemoglobin A1C: 5.5 % (ref 4.0–5.6)

## 2019-11-28 DIAGNOSIS — N186 End stage renal disease: Secondary | ICD-10-CM | POA: Diagnosis not present

## 2019-11-28 DIAGNOSIS — Z23 Encounter for immunization: Secondary | ICD-10-CM | POA: Diagnosis not present

## 2019-11-28 DIAGNOSIS — D509 Iron deficiency anemia, unspecified: Secondary | ICD-10-CM | POA: Diagnosis not present

## 2019-11-28 DIAGNOSIS — D508 Other iron deficiency anemias: Secondary | ICD-10-CM | POA: Diagnosis not present

## 2019-11-28 DIAGNOSIS — D689 Coagulation defect, unspecified: Secondary | ICD-10-CM | POA: Diagnosis not present

## 2019-11-28 DIAGNOSIS — Z992 Dependence on renal dialysis: Secondary | ICD-10-CM | POA: Diagnosis not present

## 2019-11-28 DIAGNOSIS — N2581 Secondary hyperparathyroidism of renal origin: Secondary | ICD-10-CM | POA: Diagnosis not present

## 2019-11-28 NOTE — Telephone Encounter (Signed)
Tried to contact pt but no answer and no VM. LVM on pt's fiance's phone

## 2019-11-29 ENCOUNTER — Other Ambulatory Visit: Payer: Self-pay

## 2019-11-29 ENCOUNTER — Ambulatory Visit: Payer: Medicare Other | Admitting: Physical Therapy

## 2019-11-29 ENCOUNTER — Encounter: Payer: Medicare Other | Admitting: Occupational Therapy

## 2019-11-29 ENCOUNTER — Encounter: Payer: Self-pay | Admitting: Physical Therapy

## 2019-11-29 DIAGNOSIS — M25641 Stiffness of right hand, not elsewhere classified: Secondary | ICD-10-CM

## 2019-11-29 DIAGNOSIS — M6281 Muscle weakness (generalized): Secondary | ICD-10-CM

## 2019-11-29 DIAGNOSIS — R2681 Unsteadiness on feet: Secondary | ICD-10-CM

## 2019-11-29 DIAGNOSIS — M25642 Stiffness of left hand, not elsewhere classified: Secondary | ICD-10-CM

## 2019-11-29 NOTE — Therapy (Signed)
[place] MAIN [facility] SERVICES [address] [place], [place], [address] Phone: [phone]   Fax:  [phone]  Patient Details  Name: Kimberly Frost MRN: [id number] Date of Birth: Aug 26, 1950 Referring Provider:  Ulla Potash, MD  Encounter Date: 11/29/2019 Patient comes into the clinic recovering from her boster shot and feeling awful; she did not make it to her dialysis appointment yesterday due to feeling pooly. She was advised to rest until she was recovered from her boster covid shot.   [address], [place] DPT 11/29/2019, 8:29 AM  [place] MAIN [facility] SERVICES [address] [place], [place], [address] Phone: [phone]   Fax:  [phone]

## 2019-11-30 DIAGNOSIS — N2581 Secondary hyperparathyroidism of renal origin: Secondary | ICD-10-CM | POA: Diagnosis not present

## 2019-11-30 DIAGNOSIS — D689 Coagulation defect, unspecified: Secondary | ICD-10-CM | POA: Diagnosis not present

## 2019-11-30 DIAGNOSIS — Z992 Dependence on renal dialysis: Secondary | ICD-10-CM | POA: Diagnosis not present

## 2019-11-30 DIAGNOSIS — D508 Other iron deficiency anemias: Secondary | ICD-10-CM | POA: Diagnosis not present

## 2019-11-30 DIAGNOSIS — N186 End stage renal disease: Secondary | ICD-10-CM | POA: Diagnosis not present

## 2019-11-30 DIAGNOSIS — Z23 Encounter for immunization: Secondary | ICD-10-CM | POA: Diagnosis not present

## 2019-11-30 DIAGNOSIS — D509 Iron deficiency anemia, unspecified: Secondary | ICD-10-CM | POA: Diagnosis not present

## 2019-11-30 NOTE — Telephone Encounter (Signed)
Contacted pt and advised she could proceed with inj. Pt reports she recently had covid booster and is not feeling well. Suggested pt wait a few weeks for prolia. Pt agreed. Scheduled pt for 11/18. Advised if anything changes to contact office. Pt verbalized understanding.

## 2019-12-02 ENCOUNTER — Other Ambulatory Visit: Payer: Self-pay | Admitting: Internal Medicine

## 2019-12-02 DIAGNOSIS — D509 Iron deficiency anemia, unspecified: Secondary | ICD-10-CM | POA: Diagnosis not present

## 2019-12-02 DIAGNOSIS — Z992 Dependence on renal dialysis: Secondary | ICD-10-CM | POA: Diagnosis not present

## 2019-12-02 DIAGNOSIS — N2581 Secondary hyperparathyroidism of renal origin: Secondary | ICD-10-CM | POA: Diagnosis not present

## 2019-12-02 DIAGNOSIS — D689 Coagulation defect, unspecified: Secondary | ICD-10-CM | POA: Diagnosis not present

## 2019-12-02 DIAGNOSIS — D508 Other iron deficiency anemias: Secondary | ICD-10-CM | POA: Diagnosis not present

## 2019-12-02 DIAGNOSIS — N186 End stage renal disease: Secondary | ICD-10-CM | POA: Diagnosis not present

## 2019-12-02 DIAGNOSIS — Z23 Encounter for immunization: Secondary | ICD-10-CM | POA: Diagnosis not present

## 2019-12-03 DIAGNOSIS — Z992 Dependence on renal dialysis: Secondary | ICD-10-CM | POA: Diagnosis not present

## 2019-12-03 DIAGNOSIS — N186 End stage renal disease: Secondary | ICD-10-CM | POA: Diagnosis not present

## 2019-12-03 DIAGNOSIS — I129 Hypertensive chronic kidney disease with stage 1 through stage 4 chronic kidney disease, or unspecified chronic kidney disease: Secondary | ICD-10-CM | POA: Diagnosis not present

## 2019-12-05 ENCOUNTER — Telehealth: Payer: Self-pay

## 2019-12-05 DIAGNOSIS — N2581 Secondary hyperparathyroidism of renal origin: Secondary | ICD-10-CM | POA: Diagnosis not present

## 2019-12-05 DIAGNOSIS — D689 Coagulation defect, unspecified: Secondary | ICD-10-CM | POA: Diagnosis not present

## 2019-12-05 DIAGNOSIS — N186 End stage renal disease: Secondary | ICD-10-CM | POA: Diagnosis not present

## 2019-12-05 DIAGNOSIS — D508 Other iron deficiency anemias: Secondary | ICD-10-CM | POA: Diagnosis not present

## 2019-12-05 DIAGNOSIS — Z992 Dependence on renal dialysis: Secondary | ICD-10-CM | POA: Diagnosis not present

## 2019-12-05 NOTE — Telephone Encounter (Signed)
Dr. Posey Pronto called wanting to let Dr. Bonna Gains know that the patient is willing to do the prepping for a colonoscopy for the third time and hoping that she could finally go through it. He asked for me to call patient later on the day and schedule her on a Monday.

## 2019-12-06 ENCOUNTER — Ambulatory Visit: Payer: Medicare Other | Attending: Internal Medicine

## 2019-12-06 ENCOUNTER — Other Ambulatory Visit: Payer: Self-pay

## 2019-12-06 DIAGNOSIS — M6281 Muscle weakness (generalized): Secondary | ICD-10-CM | POA: Diagnosis not present

## 2019-12-06 DIAGNOSIS — M79642 Pain in left hand: Secondary | ICD-10-CM | POA: Diagnosis not present

## 2019-12-06 DIAGNOSIS — M25641 Stiffness of right hand, not elsewhere classified: Secondary | ICD-10-CM | POA: Diagnosis not present

## 2019-12-06 DIAGNOSIS — R2681 Unsteadiness on feet: Secondary | ICD-10-CM | POA: Diagnosis not present

## 2019-12-06 DIAGNOSIS — M25642 Stiffness of left hand, not elsewhere classified: Secondary | ICD-10-CM | POA: Insufficient documentation

## 2019-12-06 DIAGNOSIS — M79641 Pain in right hand: Secondary | ICD-10-CM | POA: Diagnosis not present

## 2019-12-06 NOTE — Therapy (Signed)
[place] MAIN [facility] SERVICES [address] [place], [place], [address] Phone: [phone]   Fax:  [phone]  Physical Therapy Treatment  Patient Details  Name: Kimberly Frost MRN: [id number] Date of Birth: Nov 02, 1950 Referring Provider (PT): Elmarie Shiley   Encounter Date: 12/06/2019   PT End of Session - 12/06/19 0810    Visit Number 4    Number of Visits 9    Date for PT Re-Evaluation 12/13/19    PT Start Time 0801    PT Stop Time 0842    PT Time Calculation (min) 41 min    Activity Tolerance Patient tolerated treatment well    Behavior During Therapy [facility] for tasks assessed/performed           Past Medical History:  Diagnosis Date  . Chronic kidney disease   . Edema    RLE  . Hepatitis    Hep C  . History of blood transfusion   . Hypertension   . Wears dentures     Past Surgical History:  Procedure Laterality Date  . A/V FISTULAGRAM Left 04/11/2019   Procedure: A/V FISTULAGRAM;  Surgeon: Katha Cabal, MD;  Location: [place] [facility];  Service: Cardiovascular;  Laterality: Left;  . A/V FISTULAGRAM Left 05/01/2019   Procedure: A/V FISTULAGRAM;  Surgeon: Algernon Huxley, MD;  Location: [place] [facility];  Service: Cardiovascular;  Laterality: Left;  . AV FISTULA PLACEMENT Left 06/16/2018   Procedure: Left Arm ARTERIOVENOUS (AV) FISTULA CREATION;  Surgeon: Marty Heck, MD;  Location: [place];  Service: Vascular;  Laterality: Left;  Marland Kitchen MULTIPLE TOOTH EXTRACTIONS    . MYOMECTOMY      There were no vitals filed for this visit.   Subjective Assessment - 12/06/19 0805    Subjective Patient reports she is on a new medication for her nausea and dialysis. Dialysis is on T, Th S, feels exhausted the days after.    Pertinent History [name] has been having dyalisis over a year and was having some problems wiht passing out due to BP regulation and was using a RW . Now she is not having the passing out symptoms. She has good  days and bad days at the hospital. She is trying to get a kidney and the nurse thought she needed to have PT to have her use a RW. She is currently not using a RW. If she feels weak after dyalisis she uses a scooter at the store if she needs it.    Currently in Pain? No/denies              Vitals  At start of session: 95/55 After exercise: 86/58 -given water and taken again: 87/59   Seated: RTB around knees: -abduction 20x, cues for slow controlled activation -marching with core activation, 15x each LE  RTB around ankles: -alternating LAQ 15x each LE  -alternating ER/IR with green ball between knees 12x each LE   Standing in // bars: -grapevine 4x length of // bars.  -6" step toe taps no UE support 12x each LE cues for core contraction : HR elevation to 122 -airex pad 6" step 60 second holds x2 trials each LE placement for modified step -orange hurdle, lateral step over 10x each side   Pt educated throughout session about proper posture and technique with exercises. Improved exercise technique, movement at target joints, use of target muscles after min to mod verbal, visual, tactile cues  Patient requires monitoring of vitals  of throughout session.                      PT Education - 12/06/19 0806    Education Details exercise technique, body mechanics    Person(s) Educated Patient    Methods Explanation;Demonstration;Tactile cues;Verbal cues    Comprehension Verbalized understanding;Returned demonstration;Verbal cues required;Tactile cues required            PT Short Term Goals - 10/25/19 0859      PT SHORT TERM GOAL #1   Title Patient will be independent in home exercise program to improve strength/mobility for better functional independence with ADLs.    Time 4    Period Weeks    Status New    Target Date 01/10/20      PT SHORT TERM GOAL #2   Title Patient will increase BLE gross strength to 4+/5 as to improve functional strength for  independent gait, increased standing tolerance and increased ADL ability.    Time 8    Period Weeks    Status New    Target Date 01/10/20      PT SHORT TERM GOAL #3   Title Patient will ascend/descend 4 stairs without rail assist independently without loss of balance to improve ability to get in/out of home.    Time 8    Period Weeks    Status New    Target Date 01/10/20                    Plan - 12/06/19 0840    Clinical Impression Statement Patient is motivated throughout session but does require frequent rest breaks due to fatigue and elevated HR to allow for return to therapeutic value. Patient able to perform negotiation of obstacles and single limb stabilization interventions without UE support requiring only CGA and cueing for core activation. Patient requires water to increase BP to therapeutic ranges. Pt would continue to benefit from skilled PT services in order to further strengthen LE's, improve static and dynamic balance, and improve coordination in order to increase functional mobility and decrease risk of falls    Personal Factors and Comorbidities Comorbidity 1;Comorbidity 2;Comorbidity 3+    Comorbidities Patient has PMHx :Hyperparathyroidism, HTN Hep C: Anemia ESRD: She gets dialysis 3 x week. She follows with nephrology. She is currently waiting to be listed at [facility] for a kidney transplant.    Stability/Clinical Decision Making Stable/Uncomplicated    Rehab Potential Good    PT Frequency 1x / week    PT Duration 8 weeks           Patient will benefit from skilled therapeutic intervention in order to improve the following deficits and impairments:  Abnormal gait, Decreased activity tolerance, Difficulty walking  Visit Diagnosis: Muscle weakness (generalized)  Gait instability     Problem List Patient Active Problem List   Diagnosis Date Noted  . A-V fistula ([place]) 04/26/2019  . ESRD (end stage renal disease) ([place]) 10/26/2018  . Secondary  hyperparathyroidism of renal origin ([place]) 09/06/2018  . Anemia due to stage 5 chronic kidney disease ([place]) 02/26/2018  . Hepatitis C 02/15/2013  . Essential hypertension, benign 12/26/2008   Janna Arch, PT, DPT   12/06/2019, 8:46 AM  [place] MAIN [facility] SERVICES [address] [place], [place], [address] Phone: [phone]   Fax:  [phone]  Name: LATARA MICHELI MRN: [id number] Date of Birth: [birth year]

## 2019-12-07 DIAGNOSIS — D508 Other iron deficiency anemias: Secondary | ICD-10-CM | POA: Diagnosis not present

## 2019-12-07 DIAGNOSIS — Z992 Dependence on renal dialysis: Secondary | ICD-10-CM | POA: Diagnosis not present

## 2019-12-07 DIAGNOSIS — D689 Coagulation defect, unspecified: Secondary | ICD-10-CM | POA: Diagnosis not present

## 2019-12-07 DIAGNOSIS — N2581 Secondary hyperparathyroidism of renal origin: Secondary | ICD-10-CM | POA: Diagnosis not present

## 2019-12-07 DIAGNOSIS — N186 End stage renal disease: Secondary | ICD-10-CM | POA: Diagnosis not present

## 2019-12-09 DIAGNOSIS — D508 Other iron deficiency anemias: Secondary | ICD-10-CM | POA: Diagnosis not present

## 2019-12-09 DIAGNOSIS — D689 Coagulation defect, unspecified: Secondary | ICD-10-CM | POA: Diagnosis not present

## 2019-12-09 DIAGNOSIS — N2581 Secondary hyperparathyroidism of renal origin: Secondary | ICD-10-CM | POA: Diagnosis not present

## 2019-12-09 DIAGNOSIS — N186 End stage renal disease: Secondary | ICD-10-CM | POA: Diagnosis not present

## 2019-12-09 DIAGNOSIS — Z992 Dependence on renal dialysis: Secondary | ICD-10-CM | POA: Diagnosis not present

## 2019-12-12 DIAGNOSIS — N2581 Secondary hyperparathyroidism of renal origin: Secondary | ICD-10-CM | POA: Diagnosis not present

## 2019-12-12 DIAGNOSIS — D508 Other iron deficiency anemias: Secondary | ICD-10-CM | POA: Diagnosis not present

## 2019-12-12 DIAGNOSIS — N186 End stage renal disease: Secondary | ICD-10-CM | POA: Diagnosis not present

## 2019-12-12 DIAGNOSIS — Z992 Dependence on renal dialysis: Secondary | ICD-10-CM | POA: Diagnosis not present

## 2019-12-12 DIAGNOSIS — D689 Coagulation defect, unspecified: Secondary | ICD-10-CM | POA: Diagnosis not present

## 2019-12-13 ENCOUNTER — Encounter (INDEPENDENT_AMBULATORY_CARE_PROVIDER_SITE_OTHER): Payer: Medicare Other

## 2019-12-13 ENCOUNTER — Encounter (INDEPENDENT_AMBULATORY_CARE_PROVIDER_SITE_OTHER): Payer: Self-pay

## 2019-12-13 ENCOUNTER — Ambulatory Visit: Payer: Medicare Other | Admitting: Physical Therapy

## 2019-12-13 ENCOUNTER — Other Ambulatory Visit: Payer: Self-pay

## 2019-12-13 ENCOUNTER — Ambulatory Visit (INDEPENDENT_AMBULATORY_CARE_PROVIDER_SITE_OTHER): Payer: Medicare Other | Admitting: Nurse Practitioner

## 2019-12-13 VITALS — BP 98/65 | HR 107

## 2019-12-13 DIAGNOSIS — M6281 Muscle weakness (generalized): Secondary | ICD-10-CM | POA: Diagnosis not present

## 2019-12-13 DIAGNOSIS — M79641 Pain in right hand: Secondary | ICD-10-CM | POA: Diagnosis not present

## 2019-12-13 DIAGNOSIS — R2681 Unsteadiness on feet: Secondary | ICD-10-CM | POA: Diagnosis not present

## 2019-12-13 DIAGNOSIS — M25642 Stiffness of left hand, not elsewhere classified: Secondary | ICD-10-CM | POA: Diagnosis not present

## 2019-12-13 DIAGNOSIS — M79642 Pain in left hand: Secondary | ICD-10-CM | POA: Diagnosis not present

## 2019-12-13 DIAGNOSIS — M25641 Stiffness of right hand, not elsewhere classified: Secondary | ICD-10-CM | POA: Diagnosis not present

## 2019-12-13 NOTE — Therapy (Signed)
[place] MAIN [facility] SERVICES [address] [place], [place], [address] Phone: [phone]   Fax:  [phone]  Physical Therapy Treatment  Patient Details  Name: Kimberly Frost MRN: [id number] Date of Birth: [birth year] Referring Provider (PT): Elmarie Shiley   Encounter Date: 12/13/2019   PT End of Session - 12/13/19 0820    Visit Number 5    Number of Visits 9    Date for PT Re-Evaluation 12/13/19    PT Start Time 0805    PT Stop Time 0845    PT Time Calculation (min) 40 min    Equipment Utilized During Treatment Gait belt    Activity Tolerance Patient tolerated treatment well    Behavior During Therapy [facility] for tasks assessed/performed           Past Medical History:  Diagnosis Date  . Chronic kidney disease   . Edema    RLE  . Hepatitis    Hep C  . History of blood transfusion   . Hypertension   . Wears dentures     Past Surgical History:  Procedure Laterality Date  . A/V FISTULAGRAM Left 04/11/2019   Procedure: A/V FISTULAGRAM;  Surgeon: Katha Cabal, MD;  Location: [place] [facility];  Service: Cardiovascular;  Laterality: Left;  . A/V FISTULAGRAM Left 05/01/2019   Procedure: A/V FISTULAGRAM;  Surgeon: Algernon Huxley, MD;  Location: [place] [facility];  Service: Cardiovascular;  Laterality: Left;  . AV FISTULA PLACEMENT Left 06/16/2018   Procedure: Left Arm ARTERIOVENOUS (AV) FISTULA CREATION;  Surgeon: Marty Heck, MD;  Location: [place];  Service: Vascular;  Laterality: Left;  Marland Kitchen MULTIPLE TOOTH EXTRACTIONS    . MYOMECTOMY      Vitals:   12/13/19 0814  BP: 98/65  Pulse: (!) 107     Subjective Assessment - 12/13/19 0819    Subjective Patient reports she is on a new medication for her nausea and dialysis. Dialysis is on T, Th S, feels exhausted the days after.    Pertinent History [name] has been having dyalisis over a year and was having some problems wiht passing out due to BP regulation and was using a  RW . Now she is not having the passing out symptoms. She has good days and bad days at the hospital. She is trying to get a kidney and the nurse thought she needed to have PT to have her use a RW. She is currently not using a RW. If she feels weak after dyalisis she uses a scooter at the store if she needs it.    Currently in Pain? No/denies    Pain Score 0-No pain    Pain Onset More than a month ago           Therapeutic exercise: Octane fitness x 5 mins   Supine: SLR x 15 BLE Hookling marching x 15  Hooklying abd/ER x 15  Bridging x 15 SAQ x 15 BLE Hip abd/add x 15, BLE Heel slides x 15, BLE hooklying trunk rotation x 15  Sidelying: Hip abd x 15 , BLE  Patient performed with instruction, verbal cues, tactile cues of therapist: goal: increase tissue extensibility, promote proper posture, improve mobility                          PT Education - 12/13/19 0820    Education Details HEP    Person(s) Educated Patient    Methods  Explanation    Comprehension Verbalized understanding;Need further instruction            PT Short Term Goals - 12/13/19 0827      PT SHORT TERM GOAL #1   Title Patient will be independent in home exercise program to improve strength/mobility for better functional independence with ADLs.    Time 8    Period Weeks    Status Partially Met    Target Date 01/10/20      PT SHORT TERM GOAL #2   Title Patient will increase BLE gross strength to 4+/5 as to improve functional strength for independent gait, increased standing tolerance and increased ADL ability.    Time 8    Period Weeks    Status Partially Met    Target Date 01/10/20      PT SHORT TERM GOAL #3   Title Patient will ascend/descend 4 stairs without rail assist independently without loss of balance to improve ability to get in/out of home.    Time 8    Period Weeks    Status Partially Met    Target Date 01/10/20             PT Long Term Goals - 12/13/19 0829       PT LONG TERM GOAL #1   Title Patient has nausea and some dizziness due to dialysis. She is able to perform AROM exercises in open chain without pain behaviors. She missed several appointments after having her COVID booster shot. She will continue to benefit from skilled PT to improve mobility and strength.    Time 8    Period Weeks    Status Partially Met    Target Date 02/07/20      PT LONG TERM GOAL #2   Title Patient will ascend/descend 4 stairs without rail assist independently without loss of balance to improve ability to get in/out of home.    Time 8    Period Weeks    Status Partially Met    Target Date 02/07/20      PT LONG TERM GOAL #3   Title Patient will increase 10 meter walk test to >1.87ms as to improve gait speed for better community ambulation and to reduce fall risk.    Baseline 12/13/19= .849mec    Time 8    Period Weeks    Status New    Target Date 02/07/20                 Plan - 12/13/19 [id number]  Clinical Impression Statement Patient has nausea and some dizziness due to dialysis. She is able to perform AROM exercises in open chain without pain behaviors. She missed several appointments after having her COVID booster shot. She will continue to benefit from skilled PT to improve mobility and strength.    Personal Factors and Comorbidities Comorbidity 1;Comorbidity 2;Comorbidity 3+    Comorbidities Patient has PMHx :Hyperparathyroidism, HTN Hep C: Anemia ESRD: She gets dialysis 3 x week. She follows with nephrology. [facility]    Stability/Clinical Decision Making Stable/Uncomplicated    Rehab Potential Good    PT Frequency 1x / week    PT Duration 8 weeks    PT Treatment/Interventions Therapeutic exercise;Therapeutic activities    Consulted and Agree with Plan of Care Patient           Patient will benefit from skilled therapeutic intervention in order to improve the  following  deficits and impairments:  Abnormal gait, Decreased activity tolerance, Difficulty walking  Visit Diagnosis: Muscle weakness (generalized)  Gait instability     Problem List Patient Active Problem List   Diagnosis Date Noted  . A-V fistula ([place]) 04/26/2019  . ESRD (end stage renal disease) ([place]) 10/26/2018  . Secondary hyperparathyroidism of renal origin ([place]) 09/06/2018  . Anemia due to stage 5 chronic kidney disease ([place]) 02/26/2018  . Hepatitis C 02/15/2013  . Essential hypertension, benign 12/26/2008    Alanson Puls, [place] DPT 12/13/2019, 8:34 AM  [place] MAIN [facility] SERVICES [address] [place], [place], [address] Phone: [phone]   Fax:  [phone]  Name: Kimberly Frost MRN: [id number] Date of Birth: 1950/04/07

## 2019-12-14 DIAGNOSIS — N186 End stage renal disease: Secondary | ICD-10-CM | POA: Diagnosis not present

## 2019-12-14 DIAGNOSIS — N2581 Secondary hyperparathyroidism of renal origin: Secondary | ICD-10-CM | POA: Diagnosis not present

## 2019-12-14 DIAGNOSIS — D689 Coagulation defect, unspecified: Secondary | ICD-10-CM | POA: Diagnosis not present

## 2019-12-14 DIAGNOSIS — D508 Other iron deficiency anemias: Secondary | ICD-10-CM | POA: Diagnosis not present

## 2019-12-14 DIAGNOSIS — Z992 Dependence on renal dialysis: Secondary | ICD-10-CM | POA: Diagnosis not present

## 2019-12-16 DIAGNOSIS — N186 End stage renal disease: Secondary | ICD-10-CM | POA: Diagnosis not present

## 2019-12-16 DIAGNOSIS — D689 Coagulation defect, unspecified: Secondary | ICD-10-CM | POA: Diagnosis not present

## 2019-12-16 DIAGNOSIS — N2581 Secondary hyperparathyroidism of renal origin: Secondary | ICD-10-CM | POA: Diagnosis not present

## 2019-12-16 DIAGNOSIS — Z992 Dependence on renal dialysis: Secondary | ICD-10-CM | POA: Diagnosis not present

## 2019-12-16 DIAGNOSIS — D508 Other iron deficiency anemias: Secondary | ICD-10-CM | POA: Diagnosis not present

## 2019-12-19 DIAGNOSIS — Z992 Dependence on renal dialysis: Secondary | ICD-10-CM | POA: Diagnosis not present

## 2019-12-19 DIAGNOSIS — D508 Other iron deficiency anemias: Secondary | ICD-10-CM | POA: Diagnosis not present

## 2019-12-19 DIAGNOSIS — D689 Coagulation defect, unspecified: Secondary | ICD-10-CM | POA: Diagnosis not present

## 2019-12-19 DIAGNOSIS — N2581 Secondary hyperparathyroidism of renal origin: Secondary | ICD-10-CM | POA: Diagnosis not present

## 2019-12-19 DIAGNOSIS — N186 End stage renal disease: Secondary | ICD-10-CM | POA: Diagnosis not present

## 2019-12-20 ENCOUNTER — Encounter: Payer: Self-pay | Admitting: Physical Therapy

## 2019-12-20 ENCOUNTER — Ambulatory Visit: Payer: Medicare Other | Admitting: Physical Therapy

## 2019-12-20 ENCOUNTER — Telehealth: Payer: Self-pay

## 2019-12-20 ENCOUNTER — Other Ambulatory Visit: Payer: Self-pay

## 2019-12-20 DIAGNOSIS — M79642 Pain in left hand: Secondary | ICD-10-CM

## 2019-12-20 DIAGNOSIS — M6281 Muscle weakness (generalized): Secondary | ICD-10-CM | POA: Diagnosis not present

## 2019-12-20 DIAGNOSIS — M25641 Stiffness of right hand, not elsewhere classified: Secondary | ICD-10-CM | POA: Diagnosis not present

## 2019-12-20 DIAGNOSIS — M79641 Pain in right hand: Secondary | ICD-10-CM | POA: Diagnosis not present

## 2019-12-20 DIAGNOSIS — R2681 Unsteadiness on feet: Secondary | ICD-10-CM

## 2019-12-20 DIAGNOSIS — M25642 Stiffness of left hand, not elsewhere classified: Secondary | ICD-10-CM

## 2019-12-20 NOTE — Therapy (Addendum)
[place] MAIN [facility] SERVICES [address] [place], [place], [address] Phone: [phone]   Fax:  [phone]  Physical Therapy Treatment  Patient Details  Name: Kimberly Frost MRN: [id number] Date of Birth: [birth year] Referring Provider (PT): Elmarie Shiley   Encounter Date: 12/20/2019   PT End of Session - 12/20/19 0807    Visit Number 6    Number of Visits 9    Date for PT Re-Evaluation 02/07/20    PT Start Time 0802    PT Stop Time 0845    PT Time Calculation (min) 43 min    Equipment Utilized During Treatment Gait belt    Activity Tolerance Patient tolerated treatment well    Behavior During Therapy [facility] for tasks assessed/performed           Past Medical History:  Diagnosis Date  . Chronic kidney disease   . Edema    RLE  . Hepatitis    Hep C  . History of blood transfusion   . Hypertension   . Wears dentures     Past Surgical History:  Procedure Laterality Date  . A/V FISTULAGRAM Left 04/11/2019   Procedure: A/V FISTULAGRAM;  Surgeon: Katha Cabal, MD;  Location: [place] [facility];  Service: Cardiovascular;  Laterality: Left;  . A/V FISTULAGRAM Left 05/01/2019   Procedure: A/V FISTULAGRAM;  Surgeon: Algernon Huxley, MD;  Location: [place] [facility];  Service: Cardiovascular;  Laterality: Left;  . AV FISTULA PLACEMENT Left 06/16/2018   Procedure: Left Arm ARTERIOVENOUS (AV) FISTULA CREATION;  Surgeon: Marty Heck, MD;  Location: [place];  Service: Vascular;  Laterality: Left;  Marland Kitchen MULTIPLE TOOTH EXTRACTIONS    . MYOMECTOMY      There were no vitals filed for this visit.   Subjective Assessment - 12/20/19 0807    Subjective Patient reports she is on a new medication for her nausea and dialysis. Dialysis is on T, Th S, feels exhausted the days after.    Pertinent History [name] has been having dyalisis over a year and was having some problems wiht passing out due to BP regulation and was using a RW . Now she  is not having the passing out symptoms. She has good days and bad days at the hospital. She is trying to get a kidney and the nurse thought she needed to have PT to have her use a RW. She is currently not using a RW. If she feels weak after dyalisis she uses a scooter at the store if she needs it.    Currently in Pain? No/denies    Pain Score 0-No pain    Pain Onset More than a month ago           Therapeutic exercise: Octane fitness x 5 mins   Supine: SLR x 15 BLE Hookling marching x 15  Hooklying abd/ER x 15  Bridging x 15 SAQ x 15 BLE Hip abd/add x 15, BLE Heel slides x 15, BLE hooklying trunk rotation x 15  Sidelying: Hip abd x 15 , BLE  Patient performed with instruction, verbal cues, tactile cues of therapist: goal:increase tissue extensibility, promote proper posture, improve mobility                          PT Education - 12/20/19 0807    Education Details HEP    Person(s) Educated Patient    Methods Explanation    Comprehension Verbalized understanding  PT Short Term Goals - 12/13/19 0827      PT SHORT TERM GOAL #1   Title Patient will be independent in home exercise program to improve strength/mobility for better functional independence with ADLs.    Time 8    Period Weeks    Status Partially Met    Target Date 01/10/20      PT SHORT TERM GOAL #2   Title Patient will increase BLE gross strength to 4+/5 as to improve functional strength for independent gait, increased standing tolerance and increased ADL ability.    Time 8    Period Weeks    Status Partially Met    Target Date 01/10/20      PT SHORT TERM GOAL #3   Title Patient will ascend/descend 4 stairs without rail assist independently without loss of balance to improve ability to get in/out of home.    Time 8    Period Weeks    Status Partially Met    Target Date 01/10/20             PT Long Term Goals - 12/13/19 0829      PT LONG TERM GOAL #1    Title Patient has nausea and some dizziness due to dialysis. She is able to perform AROM exercises in open chain without pain behaviors. She missed several appointments after having her COVID booster shot. She will continue to benefit from skilled PT to improve mobility and strength.    Time 8    Period Weeks    Status Partially Met    Target Date 02/07/20      PT LONG TERM GOAL #2   Title Patient will ascend/descend 4 stairs without rail assist independently without loss of balance to improve ability to get in/out of home.    Time 8    Period Weeks    Status Partially Met    Target Date 02/07/20      PT LONG TERM GOAL #3   Title Patient will increase 10 meter walk test to >1.73ms as to improve gait speed for better community ambulation and to reduce fall risk.    Baseline 12/13/19= .889mec    Time 8    Period Weeks    Status New    Target Date 02/07/20                 Plan - 12/20/19 0808    Clinical Impression Statement Patient instructed in intermediate strengthening  exercise.  Patient requires min Vcs for correct exercise technique including to improve LE control with supine exercise. Patient demonstrates better quad control with SLS tasks with rail assist. Patient would benefit from additional skilled PT intervention to improve balance/gait safety and reduce fall risk.   Personal Factors and Comorbidities Comorbidity 1;Comorbidity 2;Comorbidity 3+    Comorbidities Patient has PMHx :Hyperparathyroidism, HTN Hep C: Anemia ESRD: She gets dialysis 3 x week. She follows with nephrology. [facility]    Stability/Clinical Decision Making Stable/Uncomplicated    Rehab Potential Good    PT Frequency 1x / week    PT Duration 8 weeks    PT Treatment/Interventions Therapeutic exercise;Therapeutic activities    Consulted and Agree with Plan of Care Patient           Patient will benefit from skilled therapeutic  intervention in order to improve the following deficits and impairments:  Abnormal gait, Decreased activity tolerance, Difficulty walking  Visit  Diagnosis: Muscle weakness (generalized)  Gait instability  Stiffness of left hand, not elsewhere classified  Stiffness of right hand, not elsewhere classified  Pain in left hand  Pain in right hand     Problem List Patient Active Problem List   Diagnosis Date Noted  . A-V fistula ([place]) 04/26/2019  . ESRD (end stage renal disease) ([place]) 10/26/2018  . Secondary hyperparathyroidism of renal origin ([place]) 09/06/2018  . Anemia due to stage 5 chronic kidney disease ([place]) 02/26/2018  . Hepatitis C 02/15/2013  . Essential hypertension, benign 12/26/2008    Arelia Sneddon SPT DPT 12/20/2019, 5:33 PM  [place] MAIN [facility] SERVICES [address] [place], [place], [address] Phone: [phone]   Fax:  [phone]  Name: JULEY GIOVANETTI MRN: [id number] Date of Birth: [birth year]

## 2019-12-20 NOTE — Telephone Encounter (Signed)
Patient left a voicemail wanting to scheduled her colonoscopy. Called patient and left a message for call back to scheduled colonoscopy

## 2019-12-21 ENCOUNTER — Ambulatory Visit (INDEPENDENT_AMBULATORY_CARE_PROVIDER_SITE_OTHER): Payer: Medicare Other

## 2019-12-21 ENCOUNTER — Other Ambulatory Visit: Payer: Self-pay

## 2019-12-21 DIAGNOSIS — N186 End stage renal disease: Secondary | ICD-10-CM | POA: Diagnosis not present

## 2019-12-21 DIAGNOSIS — D689 Coagulation defect, unspecified: Secondary | ICD-10-CM | POA: Diagnosis not present

## 2019-12-21 DIAGNOSIS — M810 Age-related osteoporosis without current pathological fracture: Secondary | ICD-10-CM

## 2019-12-21 DIAGNOSIS — N2581 Secondary hyperparathyroidism of renal origin: Secondary | ICD-10-CM | POA: Diagnosis not present

## 2019-12-21 DIAGNOSIS — Z992 Dependence on renal dialysis: Secondary | ICD-10-CM | POA: Diagnosis not present

## 2019-12-21 DIAGNOSIS — D508 Other iron deficiency anemias: Secondary | ICD-10-CM | POA: Diagnosis not present

## 2019-12-21 MED ORDER — DENOSUMAB 60 MG/ML ~~LOC~~ SOSY
60.0000 mg | PREFILLED_SYRINGE | Freq: Once | SUBCUTANEOUS | Status: AC
  Administered 2019-12-21: 60 mg via SUBCUTANEOUS

## 2019-12-21 NOTE — Progress Notes (Signed)
Per orders of [facility], injection of Prolia given by Aneta Mins, RN. Patient tolerated injection well in RUA. Patient has restricted L arm.

## 2019-12-23 DIAGNOSIS — D508 Other iron deficiency anemias: Secondary | ICD-10-CM | POA: Diagnosis not present

## 2019-12-23 DIAGNOSIS — Z992 Dependence on renal dialysis: Secondary | ICD-10-CM | POA: Diagnosis not present

## 2019-12-23 DIAGNOSIS — N2581 Secondary hyperparathyroidism of renal origin: Secondary | ICD-10-CM | POA: Diagnosis not present

## 2019-12-23 DIAGNOSIS — D689 Coagulation defect, unspecified: Secondary | ICD-10-CM | POA: Diagnosis not present

## 2019-12-23 DIAGNOSIS — N186 End stage renal disease: Secondary | ICD-10-CM | POA: Diagnosis not present

## 2019-12-25 ENCOUNTER — Telehealth: Payer: Self-pay

## 2019-12-25 DIAGNOSIS — Z992 Dependence on renal dialysis: Secondary | ICD-10-CM | POA: Diagnosis not present

## 2019-12-25 DIAGNOSIS — D508 Other iron deficiency anemias: Secondary | ICD-10-CM | POA: Diagnosis not present

## 2019-12-25 DIAGNOSIS — N186 End stage renal disease: Secondary | ICD-10-CM | POA: Diagnosis not present

## 2019-12-25 DIAGNOSIS — D689 Coagulation defect, unspecified: Secondary | ICD-10-CM | POA: Diagnosis not present

## 2019-12-25 DIAGNOSIS — N2581 Secondary hyperparathyroidism of renal origin: Secondary | ICD-10-CM | POA: Diagnosis not present

## 2019-12-25 NOTE — Telephone Encounter (Signed)
Patient left a voicemail wanting to scheduled a colonoscopy. She is a [facility] patient and canceled procedure in September

## 2019-12-26 ENCOUNTER — Other Ambulatory Visit: Payer: Self-pay

## 2019-12-26 DIAGNOSIS — Z1211 Encounter for screening for malignant neoplasm of colon: Secondary | ICD-10-CM

## 2019-12-26 MED ORDER — SUTAB 1479-225-188 MG PO TABS: ORAL_TABLET | ORAL | 0 refills | Status: DC

## 2019-12-26 NOTE — Addendum Note (Signed)
Addended by: Wayna Chalet on: 12/26/2019 09:12 AM   Modules accepted: Orders

## 2019-12-26 NOTE — Telephone Encounter (Signed)
Called patient back to schedule her colonoscopy. Therefore, she agreed to have it done on 01/08/2020 at [facility]. Patient wants to try the Sutabs again. I told her that I would send them to her pharmacy. Patient agreed and had no further questions. The instructions will be mailed per patient's request.

## 2019-12-27 ENCOUNTER — Ambulatory Visit: Payer: Medicare Other | Admitting: Physical Therapy

## 2019-12-27 DIAGNOSIS — Z992 Dependence on renal dialysis: Secondary | ICD-10-CM | POA: Diagnosis not present

## 2019-12-27 DIAGNOSIS — N2581 Secondary hyperparathyroidism of renal origin: Secondary | ICD-10-CM | POA: Diagnosis not present

## 2019-12-27 DIAGNOSIS — D689 Coagulation defect, unspecified: Secondary | ICD-10-CM | POA: Diagnosis not present

## 2019-12-27 DIAGNOSIS — N186 End stage renal disease: Secondary | ICD-10-CM | POA: Diagnosis not present

## 2019-12-27 DIAGNOSIS — D508 Other iron deficiency anemias: Secondary | ICD-10-CM | POA: Diagnosis not present

## 2019-12-30 DIAGNOSIS — N2581 Secondary hyperparathyroidism of renal origin: Secondary | ICD-10-CM | POA: Diagnosis not present

## 2019-12-30 DIAGNOSIS — N186 End stage renal disease: Secondary | ICD-10-CM | POA: Diagnosis not present

## 2019-12-30 DIAGNOSIS — D689 Coagulation defect, unspecified: Secondary | ICD-10-CM | POA: Diagnosis not present

## 2019-12-30 DIAGNOSIS — D508 Other iron deficiency anemias: Secondary | ICD-10-CM | POA: Diagnosis not present

## 2019-12-30 DIAGNOSIS — Z992 Dependence on renal dialysis: Secondary | ICD-10-CM | POA: Diagnosis not present

## 2020-01-01 ENCOUNTER — Encounter: Payer: Self-pay | Admitting: Gastroenterology

## 2020-01-02 ENCOUNTER — Telehealth (INDEPENDENT_AMBULATORY_CARE_PROVIDER_SITE_OTHER): Payer: Self-pay

## 2020-01-02 DIAGNOSIS — D689 Coagulation defect, unspecified: Secondary | ICD-10-CM | POA: Diagnosis not present

## 2020-01-02 DIAGNOSIS — N186 End stage renal disease: Secondary | ICD-10-CM | POA: Diagnosis not present

## 2020-01-02 DIAGNOSIS — Z992 Dependence on renal dialysis: Secondary | ICD-10-CM | POA: Diagnosis not present

## 2020-01-02 DIAGNOSIS — I129 Hypertensive chronic kidney disease with stage 1 through stage 4 chronic kidney disease, or unspecified chronic kidney disease: Secondary | ICD-10-CM | POA: Diagnosis not present

## 2020-01-02 DIAGNOSIS — N2581 Secondary hyperparathyroidism of renal origin: Secondary | ICD-10-CM | POA: Diagnosis not present

## 2020-01-02 DIAGNOSIS — D508 Other iron deficiency anemias: Secondary | ICD-10-CM | POA: Diagnosis not present

## 2020-01-02 NOTE — Telephone Encounter (Signed)
A fax was received for this patient to have a fistulagram. Patient is scheduled with Dr. Lucky Cowboy for a left arm fistulagram on 01/08/20 with a 9:00 am arrival time to the [facility]. Covid testing on 01/04/20 between 8-1 pm at the [place]. Pre-procedure instructions will be faxed to [facility] as requested.

## 2020-01-03 ENCOUNTER — Ambulatory Visit: Payer: Medicare Other | Attending: Internal Medicine

## 2020-01-03 ENCOUNTER — Encounter: Payer: Self-pay | Admitting: Anesthesiology

## 2020-01-03 ENCOUNTER — Other Ambulatory Visit: Payer: Self-pay

## 2020-01-03 DIAGNOSIS — M6281 Muscle weakness (generalized): Secondary | ICD-10-CM | POA: Diagnosis not present

## 2020-01-03 DIAGNOSIS — R2681 Unsteadiness on feet: Secondary | ICD-10-CM

## 2020-01-03 NOTE — Therapy (Signed)
[place] MAIN [facility] SERVICES [address] [place], [place], [address] Phone: [phone]   Fax:  [phone]  Physical Therapy Treatment  Patient Details  Name: Kimberly Frost MRN: [id number] Date of Birth: [birth year] Referring Provider (PT): Elmarie Shiley   Encounter Date: 01/03/2020   PT End of Session - 01/03/20 0908    Visit Number 7    Number of Visits 9    Date for PT Re-Evaluation 02/07/20    PT Start Time 0802    PT Stop Time 0845    PT Time Calculation (min) 43 min    Equipment Utilized During Treatment Gait belt    Activity Tolerance Patient tolerated treatment well           Past Medical History:  Diagnosis Date  . Chronic kidney disease   . Edema    RLE  . Hepatitis    Hep C  . History of blood transfusion   . Hypertension   . Wears dentures     Past Surgical History:  Procedure Laterality Date  . A/V FISTULAGRAM Left 04/11/2019   Procedure: A/V FISTULAGRAM;  Surgeon: Katha Cabal, MD;  Location: [place] [facility];  Service: Cardiovascular;  Laterality: Left;  . A/V FISTULAGRAM Left 05/01/2019   Procedure: A/V FISTULAGRAM;  Surgeon: Algernon Huxley, MD;  Location: [place] [facility];  Service: Cardiovascular;  Laterality: Left;  . AV FISTULA PLACEMENT Left 06/16/2018   Procedure: Left Arm ARTERIOVENOUS (AV) FISTULA CREATION;  Surgeon: Marty Heck, MD;  Location: [place];  Service: Vascular;  Laterality: Left;  Marland Kitchen MULTIPLE TOOTH EXTRACTIONS    . MYOMECTOMY      There were no vitals filed for this visit.   Subjective Assessment - 01/03/20 0806    Subjective The patient reports being tired from being on dialysis.  Patient was taken off one of her medications which has helped overall.    Pertinent History [name] has been having dyalisis over a year and was having some problems wiht passing out due to BP regulation and was using a RW . Now she is not having the passing out symptoms. She has good days and  bad days at the hospital. She is trying to get a kidney and the nurse thought she needed to have PT to have her use a RW. She is currently not using a RW. If she feels weak after dyalisis she uses a scooter at the store if she needs it.    Currently in Pain? No/denies           Therapeutic exercise: Octane fitness x 5 mins  Supine: SLR x 15 BLE Hookling marching x 15  Hooklying abd/ER x 15  Bridging x 15 Hip add x 15, BLE Hip abd with red TB x15 hooklying trunk rotationx 15 each  Sit to stands x10  Sidelying: Hip abd x 15 , BLE  Standing: Hip abd/ext x10 each Marching x10 each Semi tandem balance 20"x2 each   Patient performed with instruction, verbal cues, tactile cues of therapist: goal:increase tissue extensibility, promote proper posture, improve mobility                        PT Education - 01/03/20 0857    Education Details reviewed home program with patient    Person(s) Educated Patient    Methods Explanation    Comprehension Verbalized understanding  PT Short Term Goals - 12/13/19 0827      PT SHORT TERM GOAL #1   Title Patient will be independent in home exercise program to improve strength/mobility for better functional independence with ADLs.    Time 8    Period Weeks    Status Partially Met    Target Date 01/10/20      PT SHORT TERM GOAL #2   Title Patient will increase BLE gross strength to 4+/5 as to improve functional strength for independent gait, increased standing tolerance and increased ADL ability.    Time 8    Period Weeks    Status Partially Met    Target Date 01/10/20      PT SHORT TERM GOAL #3   Title Patient will ascend/descend 4 stairs without rail assist independently without loss of balance to improve ability to get in/out of home.    Time 8    Period Weeks    Status Partially Met    Target Date 01/10/20             PT Long Term Goals - 12/13/19 0829      PT LONG TERM GOAL #1    Title Patient has nausea and some dizziness due to dialysis. She is able to perform AROM exercises in open chain without pain behaviors. She missed several appointments after having her COVID booster shot. She will continue to benefit from skilled PT to improve mobility and strength.    Time 8    Period Weeks    Status Partially Met    Target Date 02/07/20      PT LONG TERM GOAL #2   Title Patient will ascend/descend 4 stairs without rail assist independently without loss of balance to improve ability to get in/out of home.    Time 8    Period Weeks    Status Partially Met    Target Date 02/07/20      PT LONG TERM GOAL #3   Title Patient will increase 10 meter walk test to >1.67ms as to improve gait speed for better community ambulation and to reduce fall risk.    Baseline 12/13/19= .833mec    Time 8    Period Weeks    Status New    Target Date 02/07/20                Visit Diagnosis: Muscle weakness (generalized)  Gait instability     Problem List Patient Active Problem List   Diagnosis Date Noted  . A-V fistula ([facility]03/24/2021  . ESRD (end stage renal disease) ([facility]09/23/2020  . Secondary hyperparathyroidism of renal origin ([facility]08/05/2018  . Anemia due to stage 5 chronic kidney disease ([facility]01/25/2020  . Hepatitis C 02/15/2013  . Essential hypertension, benign 12/26/2008   BeHal MoralesPT 01/03/2020, 9:13 AM  [place] [facility] [address] [place]hone: [phone] Fax:  [phone]: Kimberly LENTZRN: [id number] of Birth: 02/1950-04-11

## 2020-01-04 ENCOUNTER — Other Ambulatory Visit: Payer: Medicare Other

## 2020-01-04 ENCOUNTER — Telehealth: Payer: Self-pay

## 2020-01-04 DIAGNOSIS — D689 Coagulation defect, unspecified: Secondary | ICD-10-CM | POA: Diagnosis not present

## 2020-01-04 DIAGNOSIS — D631 Anemia in chronic kidney disease: Secondary | ICD-10-CM | POA: Diagnosis not present

## 2020-01-04 DIAGNOSIS — D508 Other iron deficiency anemias: Secondary | ICD-10-CM | POA: Diagnosis not present

## 2020-01-04 DIAGNOSIS — N2581 Secondary hyperparathyroidism of renal origin: Secondary | ICD-10-CM | POA: Diagnosis not present

## 2020-01-04 DIAGNOSIS — D509 Iron deficiency anemia, unspecified: Secondary | ICD-10-CM | POA: Diagnosis not present

## 2020-01-04 DIAGNOSIS — Z992 Dependence on renal dialysis: Secondary | ICD-10-CM | POA: Diagnosis not present

## 2020-01-04 DIAGNOSIS — N186 End stage renal disease: Secondary | ICD-10-CM | POA: Diagnosis not present

## 2020-01-04 NOTE — Telephone Encounter (Signed)
Called patient but had to leave a voicemail to call me back. Patient needs to reschedule her colonoscopy to another date per Fenton Malling, RN at dialysis.

## 2020-01-04 NOTE — Telephone Encounter (Signed)
Spoke with patient and told ehr that her procedure was switched with another provider-Dr. Marius Ditch (okayed by provider) and that it would be on 01/08/2020 at [facility] instead of [facility]. Patient agreed and stated that she would go tomorrow and have her covid-19 test done so she could have her procedure on Monday.

## 2020-01-04 NOTE — Telephone Encounter (Signed)
[facility] called wanting the patient to have the left arm fistulagram rescheduled. Patient has been rescheduled to 01/15/20 with a 8:30 am arrival time to the [facility]. Covid testing on 01/11/20 between 8-1 pm at the [place]. Pre-procedure instructions will be faxed to [facility].

## 2020-01-04 NOTE — Telephone Encounter (Signed)
Patient returning call, stating they are confused and she does not want to cancel her procedure with Dr. Darene Lamer for Monday 12.6.21. Please call pt back to discuss.

## 2020-01-05 ENCOUNTER — Other Ambulatory Visit: Payer: Self-pay

## 2020-01-05 ENCOUNTER — Other Ambulatory Visit
Admission: RE | Admit: 2020-01-05 | Discharge: 2020-01-05 | Disposition: A | Discharge status: Home / Self Care | Payer: Medicare Other | Source: Ambulatory Visit | Attending: Gastroenterology | Admitting: Gastroenterology

## 2020-01-05 DIAGNOSIS — Z01812 Encounter for preprocedural laboratory examination: Secondary | ICD-10-CM | POA: Diagnosis not present

## 2020-01-05 DIAGNOSIS — Z20822 Contact with and (suspected) exposure to covid-19: Secondary | ICD-10-CM | POA: Insufficient documentation

## 2020-01-06 DIAGNOSIS — D509 Iron deficiency anemia, unspecified: Secondary | ICD-10-CM | POA: Diagnosis not present

## 2020-01-06 DIAGNOSIS — Z992 Dependence on renal dialysis: Secondary | ICD-10-CM | POA: Diagnosis not present

## 2020-01-06 DIAGNOSIS — D689 Coagulation defect, unspecified: Secondary | ICD-10-CM | POA: Diagnosis not present

## 2020-01-06 DIAGNOSIS — N2581 Secondary hyperparathyroidism of renal origin: Secondary | ICD-10-CM | POA: Diagnosis not present

## 2020-01-06 DIAGNOSIS — D508 Other iron deficiency anemias: Secondary | ICD-10-CM | POA: Diagnosis not present

## 2020-01-06 DIAGNOSIS — N186 End stage renal disease: Secondary | ICD-10-CM | POA: Diagnosis not present

## 2020-01-06 DIAGNOSIS — D631 Anemia in chronic kidney disease: Secondary | ICD-10-CM | POA: Diagnosis not present

## 2020-01-06 LAB — SARS CORONAVIRUS 2 (TAT 6-24 HRS): SARS Coronavirus 2: NEGATIVE

## 2020-01-08 ENCOUNTER — Other Ambulatory Visit (INDEPENDENT_AMBULATORY_CARE_PROVIDER_SITE_OTHER): Payer: Self-pay | Admitting: Nurse Practitioner

## 2020-01-08 ENCOUNTER — Telehealth: Payer: Self-pay

## 2020-01-08 ENCOUNTER — Ambulatory Visit: Admission: RE | Admit: 2020-01-08 | Payer: Medicare Other | Source: Home / Self Care | Admitting: Gastroenterology

## 2020-01-08 DIAGNOSIS — N186 End stage renal disease: Secondary | ICD-10-CM

## 2020-01-08 SURGERY — COLONOSCOPY WITH PROPOFOL
Anesthesia: Choice

## 2020-01-08 NOTE — Telephone Encounter (Signed)
Patient called stating that she was not able to keep the Sutabs down, therefore, she called and cancelled her procedure. I then called Dr. Posey Pronto from [facility] to let her know what had happened but had to leave him a voicemail to call me back with suggestions about the patient.

## 2020-01-09 DIAGNOSIS — D508 Other iron deficiency anemias: Secondary | ICD-10-CM | POA: Diagnosis not present

## 2020-01-09 DIAGNOSIS — D509 Iron deficiency anemia, unspecified: Secondary | ICD-10-CM | POA: Diagnosis not present

## 2020-01-09 DIAGNOSIS — N2581 Secondary hyperparathyroidism of renal origin: Secondary | ICD-10-CM | POA: Diagnosis not present

## 2020-01-09 DIAGNOSIS — D689 Coagulation defect, unspecified: Secondary | ICD-10-CM | POA: Diagnosis not present

## 2020-01-09 DIAGNOSIS — Z992 Dependence on renal dialysis: Secondary | ICD-10-CM | POA: Diagnosis not present

## 2020-01-09 DIAGNOSIS — D631 Anemia in chronic kidney disease: Secondary | ICD-10-CM | POA: Diagnosis not present

## 2020-01-09 DIAGNOSIS — N186 End stage renal disease: Secondary | ICD-10-CM | POA: Diagnosis not present

## 2020-01-10 ENCOUNTER — Ambulatory Visit: Payer: Medicare Other | Admitting: Physical Therapy

## 2020-01-11 ENCOUNTER — Other Ambulatory Visit: Payer: Self-pay

## 2020-01-11 ENCOUNTER — Other Ambulatory Visit
Admission: RE | Admit: 2020-01-11 | Discharge: 2020-01-11 | Disposition: A | Discharge status: Home / Self Care | Payer: Medicare Other | Source: Ambulatory Visit | Attending: Gastroenterology | Admitting: Gastroenterology

## 2020-01-11 DIAGNOSIS — D689 Coagulation defect, unspecified: Secondary | ICD-10-CM | POA: Diagnosis not present

## 2020-01-11 DIAGNOSIS — Z20822 Contact with and (suspected) exposure to covid-19: Secondary | ICD-10-CM | POA: Diagnosis not present

## 2020-01-11 DIAGNOSIS — Z01812 Encounter for preprocedural laboratory examination: Secondary | ICD-10-CM | POA: Insufficient documentation

## 2020-01-11 DIAGNOSIS — N2581 Secondary hyperparathyroidism of renal origin: Secondary | ICD-10-CM | POA: Diagnosis not present

## 2020-01-11 DIAGNOSIS — D508 Other iron deficiency anemias: Secondary | ICD-10-CM | POA: Diagnosis not present

## 2020-01-11 DIAGNOSIS — D509 Iron deficiency anemia, unspecified: Secondary | ICD-10-CM | POA: Diagnosis not present

## 2020-01-11 DIAGNOSIS — D631 Anemia in chronic kidney disease: Secondary | ICD-10-CM | POA: Diagnosis not present

## 2020-01-11 DIAGNOSIS — N186 End stage renal disease: Secondary | ICD-10-CM | POA: Diagnosis not present

## 2020-01-11 DIAGNOSIS — Z992 Dependence on renal dialysis: Secondary | ICD-10-CM | POA: Diagnosis not present

## 2020-01-11 LAB — SARS CORONAVIRUS 2 (TAT 6-24 HRS): SARS Coronavirus 2: NEGATIVE

## 2020-01-12 ENCOUNTER — Encounter (INDEPENDENT_AMBULATORY_CARE_PROVIDER_SITE_OTHER): Payer: Medicare Other

## 2020-01-12 ENCOUNTER — Ambulatory Visit (INDEPENDENT_AMBULATORY_CARE_PROVIDER_SITE_OTHER): Payer: Medicare Other | Admitting: Vascular Surgery

## 2020-01-12 NOTE — Telephone Encounter (Signed)
I called Dr. Patel-patient's nephrologist to let him know that the patient was not able to do her procedure. He stated that he would see the patient next week and will have to talk to her. Dr. Posey Pronto stated that he will let me know if he would like to try one more time or not.

## 2020-01-13 DIAGNOSIS — N2581 Secondary hyperparathyroidism of renal origin: Secondary | ICD-10-CM | POA: Diagnosis not present

## 2020-01-13 DIAGNOSIS — D509 Iron deficiency anemia, unspecified: Secondary | ICD-10-CM | POA: Diagnosis not present

## 2020-01-13 DIAGNOSIS — D508 Other iron deficiency anemias: Secondary | ICD-10-CM | POA: Diagnosis not present

## 2020-01-13 DIAGNOSIS — N186 End stage renal disease: Secondary | ICD-10-CM | POA: Diagnosis not present

## 2020-01-13 DIAGNOSIS — Z992 Dependence on renal dialysis: Secondary | ICD-10-CM | POA: Diagnosis not present

## 2020-01-13 DIAGNOSIS — D631 Anemia in chronic kidney disease: Secondary | ICD-10-CM | POA: Diagnosis not present

## 2020-01-13 DIAGNOSIS — D689 Coagulation defect, unspecified: Secondary | ICD-10-CM | POA: Diagnosis not present

## 2020-01-15 ENCOUNTER — Encounter
Admission: RE | Disposition: A | Discharge status: Home / Self Care | Payer: Self-pay | Source: Home / Self Care | Attending: Vascular Surgery

## 2020-01-15 ENCOUNTER — Encounter: Payer: Self-pay | Admitting: Vascular Surgery

## 2020-01-15 ENCOUNTER — Ambulatory Visit
Admission: RE | Admit: 2020-01-15 | Discharge: 2020-01-15 | Disposition: A | Discharge status: Home / Self Care | Payer: Medicare Other | Attending: Vascular Surgery | Admitting: Vascular Surgery

## 2020-01-15 ENCOUNTER — Other Ambulatory Visit: Payer: Self-pay

## 2020-01-15 DIAGNOSIS — T82898A Other specified complication of vascular prosthetic devices, implants and grafts, initial encounter: Secondary | ICD-10-CM

## 2020-01-15 DIAGNOSIS — Z87891 Personal history of nicotine dependence: Secondary | ICD-10-CM | POA: Insufficient documentation

## 2020-01-15 DIAGNOSIS — Y841 Kidney dialysis as the cause of abnormal reaction of the patient, or of later complication, without mention of misadventure at the time of the procedure: Secondary | ICD-10-CM | POA: Insufficient documentation

## 2020-01-15 DIAGNOSIS — Z992 Dependence on renal dialysis: Secondary | ICD-10-CM | POA: Insufficient documentation

## 2020-01-15 DIAGNOSIS — I2510 Atherosclerotic heart disease of native coronary artery without angina pectoris: Secondary | ICD-10-CM | POA: Diagnosis not present

## 2020-01-15 DIAGNOSIS — T8249XA Other complication of vascular dialysis catheter, initial encounter: Secondary | ICD-10-CM | POA: Insufficient documentation

## 2020-01-15 DIAGNOSIS — I120 Hypertensive chronic kidney disease with stage 5 chronic kidney disease or end stage renal disease: Secondary | ICD-10-CM | POA: Insufficient documentation

## 2020-01-15 DIAGNOSIS — N186 End stage renal disease: Secondary | ICD-10-CM

## 2020-01-15 HISTORY — PX: A/V FISTULAGRAM: CATH118298

## 2020-01-15 LAB — POTASSIUM (ARMC VASCULAR LAB ONLY): Potassium (ARMC vascular lab): 4.1 (ref 3.5–5.1)

## 2020-01-15 SURGERY — A/V FISTULAGRAM
Anesthesia: Moderate Sedation | Laterality: Left

## 2020-01-15 MED ORDER — FENTANYL CITRATE (PF) 100 MCG/2ML IJ SOLN
INTRAMUSCULAR | Status: DC | PRN
  Administered 2020-01-15: 50 ug via INTRAVENOUS
  Administered 2020-01-15: 25 ug via INTRAVENOUS

## 2020-01-15 MED ORDER — SODIUM CHLORIDE 0.9 % IV SOLN: INTRAVENOUS | Status: DC

## 2020-01-15 MED ORDER — HEPARIN SODIUM (PORCINE) 1000 UNIT/ML IJ SOLN
INTRAMUSCULAR | Status: AC
  Filled 2020-01-15: qty 1

## 2020-01-15 MED ORDER — MIDAZOLAM HCL 5 MG/5ML IJ SOLN
INTRAMUSCULAR | Status: AC
  Filled 2020-01-15: qty 5

## 2020-01-15 MED ORDER — METHYLPREDNISOLONE SODIUM SUCC 125 MG IJ SOLR: 125.0000 mg | Freq: Once | INTRAMUSCULAR | Status: DC | PRN

## 2020-01-15 MED ORDER — CEFAZOLIN SODIUM-DEXTROSE 1-4 GM/50ML-% IV SOLN: 1.0000 g | Freq: Once | INTRAVENOUS | Status: DC

## 2020-01-15 MED ORDER — FENTANYL CITRATE (PF) 100 MCG/2ML IJ SOLN
INTRAMUSCULAR | Status: AC
  Filled 2020-01-15: qty 2

## 2020-01-15 MED ORDER — FAMOTIDINE 20 MG PO TABS: 40.0000 mg | ORAL_TABLET | Freq: Once | ORAL | Status: DC | PRN

## 2020-01-15 MED ORDER — DIPHENHYDRAMINE HCL 50 MG/ML IJ SOLN: 50.0000 mg | Freq: Once | INTRAMUSCULAR | Status: DC | PRN

## 2020-01-15 MED ORDER — MIDAZOLAM HCL 2 MG/2ML IJ SOLN
INTRAMUSCULAR | Status: DC | PRN
  Administered 2020-01-15: 1 mg via INTRAVENOUS
  Administered 2020-01-15: 2 mg via INTRAVENOUS

## 2020-01-15 MED ORDER — MIDAZOLAM HCL 2 MG/ML PO SYRP: 8.0000 mg | ORAL_SOLUTION | Freq: Once | ORAL | Status: DC | PRN

## 2020-01-15 SURGICAL SUPPLY — 4 items
CANNULA 5F STIFF (CANNULA) ×2 IMPLANT
PACK ANGIOGRAPHY (CUSTOM PROCEDURE TRAY) ×2 IMPLANT
SHEATH BRITE TIP 6FRX5.5 (SHEATH) ×2 IMPLANT
SUT MNCRL AB 4-0 PS2 18 (SUTURE) ×2 IMPLANT

## 2020-01-15 NOTE — Discharge Instructions (Signed)
Moderate Conscious Sedation, Adult, Care After These instructions provide you with information about caring for yourself after your procedure. Your health care provider may also give you more specific instructions. Your treatment has been planned according to current medical practices, but problems sometimes occur. Call your health care provider if you have any problems or questions after your procedure. What can I expect after the procedure? After your procedure, it is common:  To feel sleepy for several hours.  To feel clumsy and have poor balance for several hours.  To have poor judgment for several hours.  To vomit if you eat too soon. Follow these instructions at home: For at least 24 hours after the procedure:   Do not: ? Participate in activities where you could fall or become injured. ? Drive. ? Use heavy machinery. ? Drink alcohol. ? Take sleeping pills or medicines that cause drowsiness. ? Make important decisions or sign legal documents. ? Take care of children on your own.  Rest. Eating and drinking  Follow the diet recommended by your health care provider.  If you vomit: ? Drink water, juice, or soup when you can drink without vomiting. ? Make sure you have little or no nausea before eating solid foods. General instructions  Have a responsible adult stay with you until you are awake and alert.  Take over-the-counter and prescription medicines only as told by your health care provider.  If you smoke, do not smoke without supervision.  Keep all follow-up visits as told by your health care provider. This is important. Contact a health care provider if:  You keep feeling nauseous or you keep vomiting.  You feel light-headed.  You develop a rash.  You have a fever. Get help right away if:  You have trouble breathing. This information is not intended to replace advice given to you by your health care provider. Make sure you discuss any questions you have  with your health care provider. Document Revised: 01/01/2017 Document Reviewed: 05/11/2015 Elsevier Patient Education  [place].   Dialysis Fistulogram, Care After This sheet gives you information about how to care for yourself after your procedure. Your health care provider may also give you more specific instructions. If you have problems or questions, contact your health care provider. What can I expect after the procedure? After the procedure, it is common to have:  A small amount of discomfort in the area where the small, thin tube (catheter) was placed for the procedure.  A small amount of bruising around the fistula.  Sleepiness and tiredness (fatigue). Follow these instructions at home: Activity   Rest at home and do not lift anything that is heavier than 5 lb (2.3 kg) on the day after your procedure.  Return to your normal activities as told by your health care provider. Ask your health care provider what activities are safe for you.  Do not drive or use heavy machinery while taking prescription pain medicine.  Do not drive for 24 hours if you were given a medicine to help you relax (sedative) during your procedure. Medicines   Take over-the-counter and prescription medicines only as told by your health care provider. Puncture site care  Follow instructions from your health care provider about how to take care of the site where catheters were inserted. Make sure you: ? Wash your hands with soap and water before you change your bandage (dressing). If soap and water are not available, use hand sanitizer. ? Change your dressing as told by  your health care provider. ? Leave stitches (sutures), skin glue, or adhesive strips in place. These skin closures may need to stay in place for 2 weeks or longer. If adhesive strip edges start to loosen and curl up, you may trim the loose edges. Do not remove adhesive strips completely unless your health care provider tells you to  do that.  Check your puncture area every day for signs of infection. Check for: ? Redness, swelling, or pain. ? Fluid or blood. ? Warmth. ? Pus or a bad smell. General instructions  Do not take baths, swim, or use a hot tub until your health care provider approves. Ask your health care provider if you may take showers. You may only be allowed to take sponge baths.  Monitor your dialysis fistula closely. Check to make sure that you can feel a vibration or buzz (a thrill) when you put your fingers over the fistula.  Prevent damage to your graft or fistula: ? Do not wear tight-fitting clothing or jewelry on the arm or leg that has your graft or fistula. ? Tell all your health care providers that you have a dialysis fistula or graft. ? Do not allow blood draws, IVs, or blood pressure readings to be done in the arm that has your fistula or graft. ? Do not allow flu shots or vaccinations in the arm with your fistula or graft.  Keep all follow-up visits as told by your health care provider. This is important. Contact a health care provider if:  You have redness, swelling, or pain at the site where the catheter was put in.  You have fluid or blood coming from the catheter site.  The catheter site feels warm to the touch.  You have pus or a bad smell coming from the catheter site.  You have a fever or chills. Get help right away if:  You feel weak.  You have trouble balancing.  You have trouble moving your arms or legs.  You have problems with your speech or vision.  You can no longer feel a vibration or buzz when you put your fingers over your dialysis fistula.  The limb that was used for the procedure: ? Swells. ? Is painful. ? Is cold. ? Is discolored, such as blue or pale white.  You have chest pain or shortness of breath. Summary  After a dialysis fistulogram, it is common to have a small amount of discomfort or bruising in the area where the small, thin tube  (catheter) was placed.  Rest at home on the day after your procedure. Return to your normal activities as told by your health care provider.  Take over-the-counter and prescription medicines only as told by your health care provider.  Follow instructions from your health care provider about how to take care of the site where the catheter was inserted.  Keep all follow-up visits as told by your health care provider. This information is not intended to replace advice given to you by your health care provider. Make sure you discuss any questions you have with your health care provider. Document Revised: 02/19/2017 Document Reviewed: 02/19/2017 Elsevier Patient Education  2020 [employer].

## 2020-01-15 NOTE — Op Note (Signed)
[facility]    OPERATIVE NOTE   PROCEDURE: 1.   Left brachiocephalic arteriovenous fistula cannulation under ultrasound guidance 2.   Left arm fistulagram including central venogram  PRE-OPERATIVE DIAGNOSIS: 1. ESRD 2. Poorly functional left brachiocephalic AVF  POST-OPERATIVE DIAGNOSIS: same as above   SURGEON: Leotis Pain, MD  ANESTHESIA: local with MCS  ESTIMATED BLOOD LOSS: 2 cc  FINDING(S): 1. Mild narrowing of the cephalic vein at the brachiocephalic anastomosis and just beyond but not a focal stenosis.  After a couple of centimeters, the vein became a good size and there were no focal stenoses including the previously placed stent all the way to the cephalic vein subclavian vein confluence.  The central venous circulation was widely patent.  SPECIMEN(S):  None  CONTRAST: 20 cc  FLUORO TIME: 0.4 minutes  MODERATE CONSCIOUS SEDATION TIME: Approximately 12 minutes with 3 mg of Versed and 75 mcg of Fentanyl   INDICATIONS: Kimberly Frost is a 69 y.o. female who presents with malfunctioning left brachiocephalic arteriovenous fistula.  The patient is scheduled for left arm fistulagram.  The patient is aware the risks include but are not limited to: bleeding, infection, thrombosis of the cannulated access, and possible anaphylactic reaction to the contrast.  The patient is aware of the risks of the procedure and elects to proceed forward.  DESCRIPTION: After full informed written consent was obtained, the patient was brought back to the angiography suite and placed supine upon the angiography table.  The patient was connected to monitoring equipment. Moderate conscious sedation was administered with a face to face encounter with the patient throughout the procedure with my supervision of the RN administering medicines and monitoring the patient's vital signs and mental status throughout from the start of the procedure until the patient was taken to the  recovery room. The left arm was prepped and draped in the standard fashion for a percutaneous access intervention.  Under ultrasound guidance, the left brachiocephalic arteriovenous fistula was cannulated with a micropuncture needle under direct ultrasound guidance where it was patent and a permanent image was performed.  The microwire was advanced into the fistula and the needle was exchanged for the a microsheath.  I then upsized to a 6 Fr Sheath and imaging was performed.  Hand injections were completed to image the access including the central venous system as well as compression of the fistula to evaluate the arteriovenous anastomosis. This demonstrated mild narrowing of the cephalic vein at the brachiocephalic anastomosis and just beyond but not a focal stenosis.  After a couple of centimeters, the vein became a good size and there were no focal stenoses including the previously placed stent all the way to the cephalic vein subclavian vein confluence.  The central venous circulation was widely patent..  Based on the images, this patient will need no intervention.  A 4-0 Monocryl purse-string suture was sewn around the sheath.  The sheath was removed while tying down the suture.  A sterile bandage was applied to the puncture site.  COMPLICATIONS: None  CONDITION: Stable   Leotis Pain  01/15/2020 10:06 AM   This note was created with Dragon Medical transcription system. Any errors in dictation are purely unintentional.

## 2020-01-15 NOTE — H&P (Signed)
[facility] Admission History & Physical  MRN : [id number]  Kimberly Frost is a 69 y.o. ([birth year]) female who presents with chief complaint of nonfunctioning dialysis access.  History of Present Illness:  I am asked to evaluate the patient by the dialysis center. The patient was sent here because they were unable to achieve adequate dialysis during her last treatment. Furthermore the Center states there is very poor thrill and bruit. The patient states there there have been increasing problems with the access, such as "pulling clots" during dialysis and prolonged bleeding after decannulation. The patient estimates these problems have been going on for several weeks. The patient is unaware of any other change. Patient denies pain or tenderness overlying the access.  There is no pain with dialysis.  The patient denies hand pain or finger pain consistent with steal syndrome.   Current Facility-Administered Medications  Medication Dose Route Frequency Provider Last Rate Last Admin  . 0.9 %  sodium chloride infusion   Intravenous Continuous Eulogio Ditch E, NP      . ceFAZolin (ANCEF) IVPB 1 g/50 mL premix  1 g Intravenous Once Eulogio Ditch E, NP      . diphenhydrAMINE (BENADRYL) injection 50 mg  50 mg Intravenous Once PRN Kris Hartmann, NP      . famotidine (PEPCID) tablet 40 mg  40 mg Oral Once PRN Kris Hartmann, NP      . fentaNYL (SUBLIMAZE) 100 MCG/2ML injection           . heparin sodium (porcine) 1000 UNIT/ML injection           . methylPREDNISolone sodium succinate (SOLU-MEDROL) 125 mg/2 mL injection 125 mg  125 mg Intravenous Once PRN Eulogio Ditch E, NP      . midazolam (VERSED) 2 MG/ML syrup 8 mg  8 mg Oral Once PRN Kris Hartmann, NP      . midazolam (VERSED) 5 MG/5ML injection            Past Medical History:  Diagnosis Date  . Chronic kidney disease   . Edema    RLE  . Hepatitis    Hep C  . History of blood transfusion   . Hypertension   .  Wears dentures    Past Surgical History:  Procedure Laterality Date  . A/V FISTULAGRAM Left 04/11/2019   Procedure: A/V FISTULAGRAM;  Surgeon: Katha Cabal, MD;  Location: [facility];  Service: Cardiovascular;  Laterality: Left;  . A/V FISTULAGRAM Left 05/01/2019   Procedure: A/V FISTULAGRAM;  Surgeon: Algernon Huxley, MD;  Location: [facility];  Service: Cardiovascular;  Laterality: Left;  . AV FISTULA PLACEMENT Left 06/16/2018   Procedure: Left Arm ARTERIOVENOUS (AV) FISTULA CREATION;  Surgeon: Marty Heck, MD;  Location: [facility];  Service: Vascular;  Laterality: Left;  Marland Kitchen MULTIPLE TOOTH EXTRACTIONS    . MYOMECTOMY     Social History Social History   Tobacco Use  . Smoking status: Former Smoker    Types: Cigarettes  . Smokeless tobacco: Never Used  Vaping Use  . Vaping Use: Never used  Substance Use Topics  . Alcohol use: Not Currently    Alcohol/week: 6.0 standard drinks    Types: 6 Cans of beer per week  . Drug use: Not Currently    Types: Marijuana    Comment: occasional    Family History Family History  Problem Relation Age of Onset  . Diabetes Mother   .  Hypertension Mother   . Breast cancer Mother 93  . Bone cancer Father   . Kidney cancer Maternal Grandmother   . Stroke Brother   . Diabetes Brother   . Breast cancer Maternal Aunt 61  . Breast cancer Maternal Aunt 70  No family history of bleeding or clotting disorders, autoimmune disease or porphyria.  No Known Allergies  REVIEW OF SYSTEMS (Negative unless checked)  Constitutional: [] Weight loss  [] Fever  [] Chills Cardiac: [] Chest pain   [] Chest pressure   [] Palpitations   [] Shortness of breath when laying flat   [] Shortness of breath at rest   [x] Shortness of breath with exertion. Vascular:  [] Pain in legs with walking   [] Pain in legs at rest   [] Pain in legs when laying flat   [] Claudication   [] Pain in feet when walking  [] Pain in feet at rest  [] Pain in feet when laying flat    [] History of DVT   [] Phlebitis   [] Swelling in legs   [] Varicose veins   [] Non-healing ulcers Pulmonary:   [] Uses home oxygen   [] Productive cough   [] Hemoptysis   [] Wheeze  [] COPD   [] Asthma Neurologic:  [] Dizziness  [] Blackouts   [] Seizures   [] History of stroke   [] History of TIA  [] Aphasia   [] Temporary blindness   [] Dysphagia   [] Weakness or numbness in arms   [] Weakness or numbness in legs Musculoskeletal:  [] Arthritis   [] Joint swelling   [] Joint pain   [] Low back pain Hematologic:  [] Easy bruising  [] Easy bleeding   [] Hypercoagulable state   [] Anemic  [] Hepatitis Gastrointestinal:  [] Blood in stool   [] Vomiting blood  [] Gastroesophageal reflux/heartburn   [] Difficulty swallowing. Genitourinary:  [x] Chronic kidney disease   [] Difficult urination  [] Frequent urination  [] Burning with urination   [] Blood in urine Skin:  [] Rashes   [] Ulcers   [] Wounds Psychological:  [] History of anxiety   []  History of major depression.  Physical Examination  Vitals:   01/15/20 0950 01/15/20 0955 01/15/20 1000 01/15/20 1015  BP: (!) 159/99 (!) 146/71 118/61 127/78  Pulse: 85 89 88 91  Resp: 17 18 16 20   Temp:      TempSrc:      SpO2: 100% 100% 100% 100%  Weight:      Height:       Body mass index is 21.79 kg/m. Gen: WD/WN, NAD Head: [place]/AT, No temporalis wasting. Prominent temp pulse not noted. Ear/Nose/Throat: Hearing grossly intact, nares w/o erythema or drainage, oropharynx w/o Erythema/Exudate,  Eyes: Conjunctiva clear, sclera non-icteric Neck: Trachea midline.  No JVD.  Pulmonary:  Good air movement, respirations not labored, no use of accessory muscles.  Cardiac: RRR, normal S1, S2. Vascular:  Vessel Right Left  Radial Palpable Palpable  Ulnar Not Palpable Not Palpable  Brachial Palpable Palpable  Carotid Palpable, without bruit Palpable, without bruit   Upper extremity dialysis access: Palpable thrill and audible bruit. There is no acute vascular compromise noted to the extremity at  this time.  Gastrointestinal: soft, non-tender/non-distended. No guarding/reflex.  Musculoskeletal: M/S 5/5 throughout.  Extremities without ischemic changes.  No deformity or atrophy.  Neurologic: Sensation grossly intact in extremities.  Symmetrical.  Speech is fluent. Motor exam as listed above. Psychiatric: Judgment intact, Mood & affect appropriate for pt's clinical situation. Dermatologic: No rashes or ulcers noted.  No cellulitis or open wounds. Lymph : No Cervical, Axillary, or Inguinal lymphadenopathy.  CBC Lab Results  Component Value Date   WBC 8.3 07/31/2019   HGB 10.3 (L) 07/31/2019  HCT 30.9 (L) 07/31/2019   MCV 87.7 07/31/2019   PLT 247.0 07/31/2019   BMET    Component Value Date/Time   NA 137 11/01/2019 0856   NA 138 06/23/2018 0000   K 4.3 11/01/2019 0856   K 4.9 06/23/2018 0000   CL 91 (L) 11/01/2019 0856   CO2 33 (H) 11/01/2019 0856   GLUCOSE 158 (H) 11/01/2019 0856   BUN 27 (H) 11/01/2019 0856   BUN 61 06/23/2018 0000   CREATININE 8.20 (HH) 11/01/2019 0856   CREATININE 7.13 06/23/2018 0000   CALCIUM 9.9 11/01/2019 0856   CALCIUM 8.8 06/23/2018 0000   GFRNONAA 9 (L) 02/27/2018 0533   GFRAA 6 06/23/2018 0000   CrCl cannot be calculated (Patient's most recent lab result is older than the maximum 21 days allowed.).  COAG No results found for: INR, PROTIME  Radiology No results found.  Assessment/Plan 1.  Complication dialysis device with thrombosis AV access:  Patient's left dialysis access is malfunctioning. The patient will undergo angiography and correction of any problems using interventional techniques with the hope of restoring function to the access.  The risks and benefits were described to the patient.  All questions were answered.  The patient agrees to proceed with angiography and intervention. Potassium will be drawn to ensure that it is an appropriate level prior to performing intervention. 2.  End-stage renal disease requiring  hemodialysis:  Patient will continue dialysis therapy without further interruption if a successful intervention is not achieved then a tunneled catheter will be placed. Dialysis has already been arranged. 3.  Hypertension:  Patient will continue medical management; nephrology is following no changes in oral medications. 4.  Coronary artery disease:  EKG will be monitored. Nitrates will be used if needed. The patient's oral cardiac medications will be continued.  Discussed with Dr. Mayme Genta, PA-C  01/15/2020 10:31 AM

## 2020-01-15 NOTE — Progress Notes (Signed)
Prolia given by RN, chart reviewed Webb Silversmith, NP This visit occurred during the SARS-CoV-2 public health emergency.  Safety protocols were in place, including screening questions prior to the visit, additional usage of staff PPE, and extensive cleaning of exam room while observing appropriate contact time as indicated for disinfecting solutions.

## 2020-01-16 DIAGNOSIS — D509 Iron deficiency anemia, unspecified: Secondary | ICD-10-CM | POA: Diagnosis not present

## 2020-01-16 DIAGNOSIS — N2581 Secondary hyperparathyroidism of renal origin: Secondary | ICD-10-CM | POA: Diagnosis not present

## 2020-01-16 DIAGNOSIS — D508 Other iron deficiency anemias: Secondary | ICD-10-CM | POA: Diagnosis not present

## 2020-01-16 DIAGNOSIS — D631 Anemia in chronic kidney disease: Secondary | ICD-10-CM | POA: Diagnosis not present

## 2020-01-16 DIAGNOSIS — N186 End stage renal disease: Secondary | ICD-10-CM | POA: Diagnosis not present

## 2020-01-16 DIAGNOSIS — Z992 Dependence on renal dialysis: Secondary | ICD-10-CM | POA: Diagnosis not present

## 2020-01-16 DIAGNOSIS — D689 Coagulation defect, unspecified: Secondary | ICD-10-CM | POA: Diagnosis not present

## 2020-01-17 ENCOUNTER — Ambulatory Visit: Payer: Medicare Other

## 2020-01-17 ENCOUNTER — Other Ambulatory Visit: Payer: Self-pay

## 2020-01-17 DIAGNOSIS — M6281 Muscle weakness (generalized): Secondary | ICD-10-CM

## 2020-01-17 DIAGNOSIS — R2681 Unsteadiness on feet: Secondary | ICD-10-CM | POA: Diagnosis not present

## 2020-01-17 NOTE — Therapy (Signed)
[place] MAIN [facility] SERVICES [address] [place], [place], [address] Phone: [phone]   Fax:  [phone]  Physical Therapy Treatment  Patient Details  Name: Kimberly Frost MRN: [id number] Date of Birth: April 05, 1950 Referring Provider (PT): Elmarie Shiley   Encounter Date: 01/17/2020   PT End of Session - 01/17/20 0806    Visit Number 8    Number of Visits 9    Date for PT Re-Evaluation 02/07/20    Authorization Type UHC medicare    PT Start Time 0800    PT Stop Time 0840    PT Time Calculation (min) 40 min    Equipment Utilized During Treatment Gait belt    Activity Tolerance Patient tolerated treatment well;No increased pain;Patient limited by fatigue    Behavior During Therapy [facility] for tasks assessed/performed           Past Medical History:  Diagnosis Date  . Chronic kidney disease   . Edema    RLE  . Hepatitis    Hep C  . History of blood transfusion   . Hypertension   . Wears dentures     Past Surgical History:  Procedure Laterality Date  . A/V FISTULAGRAM Left 04/11/2019   Procedure: A/V FISTULAGRAM;  Surgeon: Katha Cabal, MD;  Location: [place] [facility];  Service: Cardiovascular;  Laterality: Left;  . A/V FISTULAGRAM Left 05/01/2019   Procedure: A/V FISTULAGRAM;  Surgeon: Algernon Huxley, MD;  Location: [place] [facility];  Service: Cardiovascular;  Laterality: Left;  . A/V FISTULAGRAM Left 01/15/2020   Procedure: A/V FISTULAGRAM;  Surgeon: Algernon Huxley, MD;  Location: [place] [facility];  Service: Cardiovascular;  Laterality: Left;  . AV FISTULA PLACEMENT Left 06/16/2018   Procedure: Left Arm ARTERIOVENOUS (AV) FISTULA CREATION;  Surgeon: Marty Heck, MD;  Location: [place];  Service: Vascular;  Laterality: Left;  Marland Kitchen MULTIPLE TOOTH EXTRACTIONS    . MYOMECTOMY      There were no vitals filed for this visit.   Subjective Assessment - 01/17/20 0804    Subjective Pt doing well today. No updates.  Continues to have fluctuating energy levels. Attempts HEP when able. No falls or stumbles.    Pertinent History [name] has been having dyalisis over a year and was having some problems wiht passing out due to BP regulation and was using a RW . Now she is not having the passing out symptoms. She has good days and bad days at the hospital. She is trying to get a kidney and the nurse thought she needed to have PT to have her use a RW. She is currently not using a RW. If she feels weak after dyalisis she uses a scooter at the store if she needs it.    Currently in Pain? No/denies    Pain Score --   soreness in arm from post procedure yesterday.          INTERVENTION THIS DATE: 121/43mmHg  supine  108/39mmHg  109bpm  sitting  101/3mmHg  101bpm  sitting  90/68mmHg  116bpm  standing @ 0 minutes 84/69mmHg  122bpm  standing at 1 minute 91/8mmHg  122bpm standing at 2 minutes (asks to sit do to beginning to feel poorly)  -[employer] seated elliptical trainer, level 2 7 minutes (no orthostatic symptoms)  *Post Octane 110 bpm  -STS from plinth 2x10 -seated marching 2x12 2.5lb ankle weights  -seated LAQ 2x10 c 5lbAW bilat  -lateral side stepping in //  bars GTB 3 round (beging to have global fatigue/weakness, HR 121bpm) *Pt escorted back up to entrance lobby due to BP drops this date. Has to lean against wall while waiting for elevator.   -Orthostatic BP issues, persist, pt reports being asked to take midodrine 30 minutes prior to HD, only on HD days. Author will contact nephrologist Dr. Posey Pronto to inform them they are aware that [facility] issues persist and see if they have any updated recommendations. Pt reports taking midodrine daily months back, but was was having HTN issues. Pt educated on need to avoid supine and stay upright or seated tall while taking midodrine.      PT Short Term Goals - 12/13/19 0827      PT SHORT TERM GOAL #1   Title Patient will be independent in home exercise program to  improve strength/mobility for better functional independence with ADLs.    Time 8    Period Weeks    Status Partially Met    Target Date 01/10/20      PT SHORT TERM GOAL #2   Title Patient will increase BLE gross strength to 4+/5 as to improve functional strength for independent gait, increased standing tolerance and increased ADL ability.    Time 8    Period Weeks    Status Partially Met    Target Date 01/10/20      PT SHORT TERM GOAL #3   Title Patient will ascend/descend 4 stairs without rail assist independently without loss of balance to improve ability to get in/out of home.    Time 8    Period Weeks    Status Partially Met    Target Date 01/10/20             PT Long Term Goals - 12/13/19 0829      PT LONG TERM GOAL #1   Title Patient has nausea and some dizziness due to dialysis. She is able to perform AROM exercises in open chain without pain behaviors. She missed several appointments after having her COVID booster shot. She will continue to benefit from skilled PT to improve mobility and strength.    Time 8    Period Weeks    Status Partially Met    Target Date 02/07/20      PT LONG TERM GOAL #2   Title Patient will ascend/descend 4 stairs without rail assist independently without loss of balance to improve ability to get in/out of home.    Time 8    Period Weeks    Status Partially Met    Target Date 02/07/20      PT LONG TERM GOAL #3   Title Patient will increase 10 meter walk test to >1.74ms as to improve gait speed for better community ambulation and to reduce fall risk.    Baseline 12/13/19= .872mec    Time 8    Period Weeks    Status New    Target Date 02/07/20                 Plan - 01/17/20 0808    Clinical Impression Statement Pt remain orthostatic with date, not tolerating standing >2 minutes for BP assessment. Continued with current plan of care as laid out in evaluation and recent prior sessions. Standing tolerance consistently <2  minutes throughout session. Pt remains motivated to advance progress toward goals. Rest breaks provided as needed, pt quick to ask when needed. BP drops correlate with tachycardia in 120s. Pt does require varying levels of assistance  and cuing for completion of exercises for correct form and sometimes due to pain/weakness. Pt closely monitored throughout session for safe vitals response and to maximize patient safety during interventions. Pt continues to demonstrate progress toward goals AEB progression of some interventions this date either in volume or intensity.     Personal Factors and Comorbidities Comorbidity 1;Comorbidity 2;Comorbidity 3+    Comorbidities Patient has PMHx :Hyperparathyroidism, HTN Hep C: Anemia ESRD: She gets dialysis 3 x week. She follows with nephrology. She is currently waiting to be listed at [facility] for a kidney transplant.    Stability/Clinical Decision Making Stable/Uncomplicated    Clinical Decision Making Low    Rehab Potential Good    PT Frequency 1x / week    PT Duration 8 weeks    PT Treatment/Interventions Therapeutic exercise;Therapeutic activities    PT Next Visit Plan continue to progress strength and activity tolerance    PT Home Exercise Plan no updates this session    Consulted and Agree with Plan of Care Patient           Patient will benefit from skilled therapeutic intervention in order to improve the following deficits and impairments:  Abnormal gait,Decreased activity tolerance,Difficulty walking  Visit Diagnosis: Muscle weakness (generalized)  Gait instability     Problem List Patient Active Problem List   Diagnosis Date Noted  . A-V fistula ([place]) 04/26/2019  . ESRD (end stage renal disease) ([place]) 10/26/2018  . Secondary hyperparathyroidism of renal origin ([place]) 09/06/2018  . Anemia due to stage 5 chronic kidney disease ([place]) 02/26/2018  . Hepatitis C 02/15/2013  . Essential hypertension, benign 12/26/2008   8:53 AM,  01/17/20 Etta Grandchild, PT, DPT Physical Therapist - [facility]  Outpatient Physical Therapy- [place] [phone]     Etta Grandchild 01/17/2020, 8:17 AM  [place] MAIN [facility] SERVICES [address] [place], [place], [address] Phone: [phone]   Fax:  [phone]  Name: Kimberly Frost MRN: [id number] Date of Birth: 02-10-1950

## 2020-01-18 DIAGNOSIS — D509 Iron deficiency anemia, unspecified: Secondary | ICD-10-CM | POA: Diagnosis not present

## 2020-01-18 DIAGNOSIS — N186 End stage renal disease: Secondary | ICD-10-CM | POA: Diagnosis not present

## 2020-01-18 DIAGNOSIS — Z992 Dependence on renal dialysis: Secondary | ICD-10-CM | POA: Diagnosis not present

## 2020-01-18 DIAGNOSIS — D689 Coagulation defect, unspecified: Secondary | ICD-10-CM | POA: Diagnosis not present

## 2020-01-18 DIAGNOSIS — D631 Anemia in chronic kidney disease: Secondary | ICD-10-CM | POA: Diagnosis not present

## 2020-01-18 DIAGNOSIS — D508 Other iron deficiency anemias: Secondary | ICD-10-CM | POA: Diagnosis not present

## 2020-01-18 DIAGNOSIS — N2581 Secondary hyperparathyroidism of renal origin: Secondary | ICD-10-CM | POA: Diagnosis not present

## 2020-01-20 DIAGNOSIS — D508 Other iron deficiency anemias: Secondary | ICD-10-CM | POA: Diagnosis not present

## 2020-01-20 DIAGNOSIS — Z992 Dependence on renal dialysis: Secondary | ICD-10-CM | POA: Diagnosis not present

## 2020-01-20 DIAGNOSIS — D509 Iron deficiency anemia, unspecified: Secondary | ICD-10-CM | POA: Diagnosis not present

## 2020-01-20 DIAGNOSIS — N186 End stage renal disease: Secondary | ICD-10-CM | POA: Diagnosis not present

## 2020-01-20 DIAGNOSIS — N2581 Secondary hyperparathyroidism of renal origin: Secondary | ICD-10-CM | POA: Diagnosis not present

## 2020-01-20 DIAGNOSIS — D631 Anemia in chronic kidney disease: Secondary | ICD-10-CM | POA: Diagnosis not present

## 2020-01-20 DIAGNOSIS — D689 Coagulation defect, unspecified: Secondary | ICD-10-CM | POA: Diagnosis not present

## 2020-01-23 DIAGNOSIS — N186 End stage renal disease: Secondary | ICD-10-CM | POA: Diagnosis not present

## 2020-01-23 DIAGNOSIS — D689 Coagulation defect, unspecified: Secondary | ICD-10-CM | POA: Diagnosis not present

## 2020-01-23 DIAGNOSIS — D509 Iron deficiency anemia, unspecified: Secondary | ICD-10-CM | POA: Diagnosis not present

## 2020-01-23 DIAGNOSIS — N2581 Secondary hyperparathyroidism of renal origin: Secondary | ICD-10-CM | POA: Diagnosis not present

## 2020-01-23 DIAGNOSIS — D508 Other iron deficiency anemias: Secondary | ICD-10-CM | POA: Diagnosis not present

## 2020-01-23 DIAGNOSIS — D631 Anemia in chronic kidney disease: Secondary | ICD-10-CM | POA: Diagnosis not present

## 2020-01-23 DIAGNOSIS — Z992 Dependence on renal dialysis: Secondary | ICD-10-CM | POA: Diagnosis not present

## 2020-01-24 ENCOUNTER — Ambulatory Visit: Payer: Medicare Other | Admitting: Physical Therapy

## 2020-01-24 ENCOUNTER — Other Ambulatory Visit: Payer: Self-pay

## 2020-01-24 ENCOUNTER — Ambulatory Visit: Payer: Medicare Other

## 2020-01-24 VITALS — BP 132/72 | HR 72

## 2020-01-24 DIAGNOSIS — M6281 Muscle weakness (generalized): Secondary | ICD-10-CM

## 2020-01-24 DIAGNOSIS — R2681 Unsteadiness on feet: Secondary | ICD-10-CM | POA: Diagnosis not present

## 2020-01-24 NOTE — Therapy (Signed)
[place] [facility] MAIN [facility] SERVICES [address] [place], [place], [address] Phone: [phone]   Fax:  [phone]  Physical Therapy Treatment  Patient Details  Name: Kimberly Frost MRN: [id number] Date of Birth: 04/15/1950 Referring Provider (PT): Zetta Bills   Encounter Date: 01/24/2020   PT End of Session - 01/24/20 0754    Visit Number 9    Number of Visits 9    Date for PT Re-Evaluation 02/07/20    Authorization Type UHC medicare    PT Start Time 0800    PT Stop Time 0845    PT Time Calculation (min) 45 min    Equipment Utilized During Treatment Gait belt    Activity Tolerance Patient tolerated treatment well;No increased pain;Patient limited by fatigue    Behavior During Therapy [facility] for tasks assessed/performed           Past Medical History:  Diagnosis Date  . Chronic kidney disease   . Edema    RLE  . Hepatitis    Hep C  . History of blood transfusion   . Hypertension   . Wears dentures     Past Surgical History:  Procedure Laterality Date  . A/V FISTULAGRAM Left 04/11/2019   Procedure: A/V FISTULAGRAM;  Surgeon: Renford Dills, MD;  Location: [facility] IN[facility];  Service: Cardiovascular;  Laterality: Left;  . A/V FISTULAGRAM Left 05/01/2019   Procedure: A/V FISTULAGRAM;  Surgeon: Annice Needy, MD;  Location: [facility] IN[facility];  Service: Cardiovascular;  Laterality: Left;  . A/V FISTULAGRAM Left 01/15/2020   Procedure: A/V FISTULAGRAM;  Surgeon: Annice Needy, MD;  Location: [facility] IN[facility];  Service: Cardiovascular;  Laterality: Left;  . AV FISTULA PLACEMENT Left 06/16/2018   Procedure: Left Arm ARTERIOVENOUS (AV) FISTULA CREATION;  Surgeon: Cephus Shelling, MD;  Location: [facility] OR;  Service: Vascular;  Laterality: Left;  Marland Kitchen MULTIPLE TOOTH EXTRACTIONS    . MYOMECTOMY      Vitals:   01/24/20 0813  BP: 132/72  Pulse: 72     Subjective Assessment - 01/24/20 0808    Subjective Patient reports of taking  her blood pressure medication every day since last therapy session due to fluctuating BP readings. She denies of any new symptoms or pain since last therapy session.    Pertinent History [name] has been having dyalisis over a year and was having some problems wiht passing out due to BP regulation and was using a RW . Now she is not having the passing out symptoms. She has good days and bad days at the hospital. She is trying to get a kidney and the nurse thought she needed to have PT to have her use a RW. She is currently not using a RW. If she feels weak after dyalisis she uses a scooter at the store if she needs it.    Currently in Pain? No/denies          Therapeutic exercise: [employer] x 5 mins   Supine: SLR x 15 BLE Hookling marching x 15  Hooklying abd/ER x 15  Bridging x 15 Hooklying hip adduction with ball squeezes x 30 Hip add x 15, BLE Hip abd with red TB x15 Hooklying trunk rotationx 15 each  Sit to stands 2 x 5  Seated: Marches x 30 reps  LAQ x 30 reps each leg Hip abduction/adduction crossovers x 20 reps          PT Short Term Goals -  12/13/19 0827      PT SHORT TERM GOAL #1   Title Patient will be independent in home exercise program to improve strength/mobility for better functional independence with ADLs.    Time 8    Period Weeks    Status Partially Met    Target Date 01/10/20      PT SHORT TERM GOAL #2   Title Patient will increase BLE gross strength to 4+/5 as to improve functional strength for independent gait, increased standing tolerance and increased ADL ability.    Time 8    Period Weeks    Status Partially Met    Target Date 01/10/20      PT SHORT TERM GOAL #3   Title Patient will ascend/descend 4 stairs without rail assist independently without loss of balance to improve ability to get in/out of home.    Time 8    Period Weeks    Status Partially Met    Target Date 01/10/20             PT Long Term Goals - 12/13/19 0829       PT LONG TERM GOAL #1   Title Patient has nausea and some dizziness due to dialysis. She is able to perform AROM exercises in open chain without pain behaviors. She missed several appointments after having her COVID booster shot. She will continue to benefit from skilled PT to improve mobility and strength.    Time 8    Period Weeks    Status Partially Met    Target Date 02/07/20      PT LONG TERM GOAL #2   Title Patient will ascend/descend 4 stairs without rail assist independently without loss of balance to improve ability to get in/out of home.    Time 8    Period Weeks    Status Partially Met    Target Date 02/07/20      PT LONG TERM GOAL #3   Title Patient will increase 10 meter walk test to >1.56m/s as to improve gait speed for better community ambulation and to reduce fall risk.    Baseline 12/13/19= .2m/sec    Time 8    Period Weeks    Status New    Target Date 02/07/20                 Plan - 01/24/20 0851    Clinical Impression Statement Patient completed strengthening exercises with minimal increase in pain. Patient demonstrates increased muscle fatigue with activity and requires longer therapeutic rest break to recover. Patient reports of increased shivers and dizziness with seated position. Therapist provided verbal cues to avoid trunk extension when completing hip exercises in unsupported sitting. Patient would benefit from continued PT services to incresae patient's strength and mobility to improve patient's quality of life.    Personal Factors and Comorbidities Comorbidity 1;Comorbidity 2;Comorbidity 3+    Comorbidities Patient has PMHx :Hyperparathyroidism, HTN Hep C: Anemia ESRD: She gets dialysis 3 x week. She follows with nephrology. She is currently waiting to be listed at [facility] for a kidney transplant.    Stability/Clinical Decision Making Stable/Uncomplicated    Rehab Potential Good    PT Frequency 1x / week    PT Duration 8 weeks    PT  Treatment/Interventions Therapeutic exercise;Therapeutic activities    PT Next Visit Plan continue to progress strength and activity tolerance    PT Home Exercise Plan no updates this session    Consulted and Agree with Plan of  Care Patient           Patient will benefit from skilled therapeutic intervention in order to improve the following deficits and impairments:  Abnormal gait,Decreased activity tolerance,Difficulty walking  Visit Diagnosis: Muscle weakness (generalized)  Gait instability     Problem List Patient Active Problem List   Diagnosis Date Noted  . A-V fistula ([place]) 04/26/2019  . ESRD (end stage renal disease) ([place]) 10/26/2018  . Secondary hyperparathyroidism of renal origin ([place]) 09/06/2018  . Anemia due to stage 5 chronic kidney disease ([place]) 02/26/2018  . Hepatitis C 02/15/2013  . Essential hypertension, benign 12/26/2008   Karl Luke PT, DPT Netta Corrigan 01/24/2020, 9:02 AM  [place] MAIN [facility] SERVICES [address] [place], [place], [address] Phone: [phone]   Fax:  [phone]  Name: Kimberly Frost MRN: [id number] Date of Birth: [birth year]

## 2020-01-25 DIAGNOSIS — D631 Anemia in chronic kidney disease: Secondary | ICD-10-CM | POA: Diagnosis not present

## 2020-01-25 DIAGNOSIS — Z992 Dependence on renal dialysis: Secondary | ICD-10-CM | POA: Diagnosis not present

## 2020-01-25 DIAGNOSIS — D689 Coagulation defect, unspecified: Secondary | ICD-10-CM | POA: Diagnosis not present

## 2020-01-25 DIAGNOSIS — D509 Iron deficiency anemia, unspecified: Secondary | ICD-10-CM | POA: Diagnosis not present

## 2020-01-25 DIAGNOSIS — D508 Other iron deficiency anemias: Secondary | ICD-10-CM | POA: Diagnosis not present

## 2020-01-25 DIAGNOSIS — N186 End stage renal disease: Secondary | ICD-10-CM | POA: Diagnosis not present

## 2020-01-25 DIAGNOSIS — N2581 Secondary hyperparathyroidism of renal origin: Secondary | ICD-10-CM | POA: Diagnosis not present

## 2020-01-28 DIAGNOSIS — N186 End stage renal disease: Secondary | ICD-10-CM | POA: Diagnosis not present

## 2020-01-28 DIAGNOSIS — D631 Anemia in chronic kidney disease: Secondary | ICD-10-CM | POA: Diagnosis not present

## 2020-01-28 DIAGNOSIS — N2581 Secondary hyperparathyroidism of renal origin: Secondary | ICD-10-CM | POA: Diagnosis not present

## 2020-01-28 DIAGNOSIS — D508 Other iron deficiency anemias: Secondary | ICD-10-CM | POA: Diagnosis not present

## 2020-01-28 DIAGNOSIS — D509 Iron deficiency anemia, unspecified: Secondary | ICD-10-CM | POA: Diagnosis not present

## 2020-01-28 DIAGNOSIS — D689 Coagulation defect, unspecified: Secondary | ICD-10-CM | POA: Diagnosis not present

## 2020-01-28 DIAGNOSIS — Z992 Dependence on renal dialysis: Secondary | ICD-10-CM | POA: Diagnosis not present

## 2020-01-30 DIAGNOSIS — D508 Other iron deficiency anemias: Secondary | ICD-10-CM | POA: Diagnosis not present

## 2020-01-30 DIAGNOSIS — N2581 Secondary hyperparathyroidism of renal origin: Secondary | ICD-10-CM | POA: Diagnosis not present

## 2020-01-30 DIAGNOSIS — N186 End stage renal disease: Secondary | ICD-10-CM | POA: Diagnosis not present

## 2020-01-30 DIAGNOSIS — D631 Anemia in chronic kidney disease: Secondary | ICD-10-CM | POA: Diagnosis not present

## 2020-01-30 DIAGNOSIS — D509 Iron deficiency anemia, unspecified: Secondary | ICD-10-CM | POA: Diagnosis not present

## 2020-01-30 DIAGNOSIS — D689 Coagulation defect, unspecified: Secondary | ICD-10-CM | POA: Diagnosis not present

## 2020-01-30 DIAGNOSIS — Z992 Dependence on renal dialysis: Secondary | ICD-10-CM | POA: Diagnosis not present

## 2020-01-31 ENCOUNTER — Other Ambulatory Visit: Payer: Self-pay

## 2020-01-31 ENCOUNTER — Ambulatory Visit: Payer: Medicare Other

## 2020-01-31 DIAGNOSIS — R2681 Unsteadiness on feet: Secondary | ICD-10-CM | POA: Diagnosis not present

## 2020-01-31 DIAGNOSIS — M6281 Muscle weakness (generalized): Secondary | ICD-10-CM | POA: Diagnosis not present

## 2020-01-31 NOTE — Therapy (Signed)
[place] MAIN [facility] SERVICES [address] [place], [place], [address] Phone: [phone]   Fax:  [phone]  Physical Therapy Treatment Physical Therapy Progress Note/re-certification   Dates of reporting period  10/18/19   to   01/31/20  Patient Details  Name: Kimberly Frost MRN: [id number] Date of Birth: 12/08/1950 Referring Provider (PT): Elmarie Shiley   Encounter Date: 01/31/2020   PT End of Session - 01/31/20 0842    Visit Number 10    Number of Visits 17    Date for PT Re-Evaluation 02/28/20    Authorization Type UHC medicare    PT Start Time 0845    PT Stop Time 0930    PT Time Calculation (min) 45 min    Equipment Utilized During Treatment Gait belt    Activity Tolerance Patient tolerated treatment well;No increased pain;Patient limited by fatigue    Behavior During Therapy [facility] for tasks assessed/performed           Past Medical History:  Diagnosis Date   Chronic kidney disease    Edema    RLE   Hepatitis    Hep C   History of blood transfusion    Hypertension    Wears dentures     Past Surgical History:  Procedure Laterality Date   A/V FISTULAGRAM Left 04/11/2019   Procedure: A/V FISTULAGRAM;  Surgeon: Katha Cabal, MD;  Location: [place] [facility];  Service: Cardiovascular;  Laterality: Left;   A/V FISTULAGRAM Left 05/01/2019   Procedure: A/V FISTULAGRAM;  Surgeon: Algernon Huxley, MD;  Location: [place] [facility];  Service: Cardiovascular;  Laterality: Left;   A/V FISTULAGRAM Left 01/15/2020   Procedure: A/V FISTULAGRAM;  Surgeon: Algernon Huxley, MD;  Location: [place] [facility];  Service: Cardiovascular;  Laterality: Left;   AV FISTULA PLACEMENT Left 06/16/2018   Procedure: Left Arm ARTERIOVENOUS (AV) FISTULA CREATION;  Surgeon: Marty Heck, MD;  Location: [place];  Service: Vascular;  Laterality: Left;   MULTIPLE TOOTH EXTRACTIONS     MYOMECTOMY      There were no vitals filed for  this visit.   Subjective Assessment - 01/31/20 0942    Subjective Pt reports she is tired today upon arrival.  She had dialysis yesterday.  She continues to take BP med daily.  She has not had any falls since last session.    Pertinent History [name] has been having dyalisis over a year and was having some problems wiht passing out due to BP regulation and was using a RW . Now she is not having the passing out symptoms. She has good days and bad days at the hospital. She is trying to get a kidney and the nurse thought she needed to have PT to have her use a RW. She is currently not using a RW. If she feels weak after dyalisis she uses a scooter at the store if she needs it.             Treatment Today: Objective measures: Vitals: BP at start of session 110/72, HR 102  STS 5x: 14 seconds Stair navigation: pt able to ascend/descend 4 stairs with b/l railing independently and no loss of balance Balance: pt able to stand 4 sec SLS R and 3 sec L  Octane: 5 min  2.5# ankle weight: - LAQ x20 ea LE -standing marches x10 ea LE -heel raises x 20 -toe raises (sitting) x20  5x STS, 2 sets from normal height  chair, pt able to perform without use of hands, lacks eccentric control into chair  Seated hip abd/ER green TB x 20, 3-5 sec holds Seated hip add green ball, 3-5 sec holds Tandem stance airex in // bars 3 trials (15-20 sec ea), PT SBA SLS on flat surface 3 trials ea (3-4 sec ea), PT SBA  BP end of session: 110/82, HR: 102   Pt reports fatigue at end of session.  Her performance on balance exercises support continued need for PT to increase safety/decrease fall risk, and to improve overall LE strength and activity tolerance in order to obtain max QOL and benefit from skilled PT while waiting to be placed on kidney transplant list.   PT Education - 01/31/20 0841    Education Details exercise technique    Methods Explanation;Tactile cues;Verbal cues;Demonstration    Comprehension  Verbalized understanding;Returned demonstration            PT Short Term Goals - 01/31/20 0946      PT SHORT TERM GOAL #1   Title Patient will be independent in home exercise program to improve strength/mobility for better functional independence with ADLs.    Time 8    Period Weeks    Status Partially Met    Target Date 02/28/20      PT SHORT TERM GOAL #2   Title Patient will increase BLE gross strength to 4+/5 as to improve functional strength for independent gait, increased standing tolerance and increased ADL ability.    Time 8    Period Weeks    Status Partially Met    Target Date 02/28/20      PT SHORT TERM GOAL #3   Title Patient will ascend/descend 4 stairs without rail assist independently without loss of balance to improve ability to get in/out of home.    Baseline 12/29: able to perform 4 stairs x3 with b/l rail today and no loss of balance    Time 8    Period Weeks    Status Achieved    Target Date 01/10/20             PT Long Term Goals - 01/31/20 0947      PT LONG TERM GOAL #1   Title --    Time 8    Period Weeks    Status Partially Met      PT LONG TERM GOAL #2   Title Patient will ascend/descend 4 stairs without rail assist independently without loss of balance to improve ability to get in/out of home.    Baseline see above    Time 8    Period Weeks    Status Achieved      PT LONG TERM GOAL #3   Title Patient will increase 10 meter walk test to >1.35ms as to improve gait speed for better community ambulation and to reduce fall risk.    Baseline 12/13/19= .84mec    Time 8    Period Weeks    Status New                 Plan - 01/31/20 0944    Clinical Impression Statement Pt was able to complete today's therapeutic exercises with intermittent rest breaks.  Her BP stayed within normal range during session.  Overall, she is making progress with tx and would continue to benefit from skilled PT services in order to further strengthen LE's,  improve static and dynamic balance, and improve coordination in order to increase functional mobility and decrease  risk of falls.    Personal Factors and Comorbidities Comorbidity 1;Comorbidity 2;Comorbidity 3+    Comorbidities Patient has PMHx :Hyperparathyroidism, HTN Hep C: Anemia ESRD: She gets dialysis 3 x week. She follows with nephrology. She is currently waiting to be listed at [facility] for a kidney transplant.    Stability/Clinical Decision Making Stable/Uncomplicated    Rehab Potential Good    PT Frequency 1x / week    PT Duration 8 weeks    PT Treatment/Interventions Therapeutic exercise;Therapeutic activities    PT Next Visit Plan continue to progress strength and activity tolerance    PT Home Exercise Plan no updates this session    Consulted and Agree with Plan of Care Patient           Patient will benefit from skilled therapeutic intervention in order to improve the following deficits and impairments:  Abnormal gait,Decreased activity tolerance,Difficulty walking  Visit Diagnosis: Muscle weakness (generalized)  Gait instability     Problem List Patient Active Problem List   Diagnosis Date Noted   A-V fistula ([place]) 04/26/2019   ESRD (end stage renal disease) ([place]) 10/26/2018   Secondary hyperparathyroidism of renal origin ([place]) 09/06/2018   Anemia due to stage 5 chronic kidney disease ([place]) 02/26/2018   Hepatitis C 02/15/2013   Essential hypertension, benign 12/26/2008    Pincus Badder 01/31/2020, 9:49 AM Merdis Delay, PT, DPT Physical Therapist - [facility]  Outpatient Physical Therapy- [place] MAIN [facility] SERVICES [address] [place], [place], [address] Phone: [phone]   Fax:  [phone]  Name: SIRI BUEGE MRN: [id number] Date of Birth: [birth year]

## 2020-02-01 DIAGNOSIS — Z992 Dependence on renal dialysis: Secondary | ICD-10-CM | POA: Diagnosis not present

## 2020-02-01 DIAGNOSIS — D509 Iron deficiency anemia, unspecified: Secondary | ICD-10-CM | POA: Diagnosis not present

## 2020-02-01 DIAGNOSIS — D631 Anemia in chronic kidney disease: Secondary | ICD-10-CM | POA: Diagnosis not present

## 2020-02-01 DIAGNOSIS — N2581 Secondary hyperparathyroidism of renal origin: Secondary | ICD-10-CM | POA: Diagnosis not present

## 2020-02-01 DIAGNOSIS — D508 Other iron deficiency anemias: Secondary | ICD-10-CM | POA: Diagnosis not present

## 2020-02-01 DIAGNOSIS — D689 Coagulation defect, unspecified: Secondary | ICD-10-CM | POA: Diagnosis not present

## 2020-02-01 DIAGNOSIS — N186 End stage renal disease: Secondary | ICD-10-CM | POA: Diagnosis not present

## 2020-02-02 DIAGNOSIS — N186 End stage renal disease: Secondary | ICD-10-CM | POA: Diagnosis not present

## 2020-02-02 DIAGNOSIS — I129 Hypertensive chronic kidney disease with stage 1 through stage 4 chronic kidney disease, or unspecified chronic kidney disease: Secondary | ICD-10-CM | POA: Diagnosis not present

## 2020-02-02 DIAGNOSIS — Z992 Dependence on renal dialysis: Secondary | ICD-10-CM | POA: Diagnosis not present

## 2020-02-04 DIAGNOSIS — D508 Other iron deficiency anemias: Secondary | ICD-10-CM | POA: Diagnosis not present

## 2020-02-04 DIAGNOSIS — D631 Anemia in chronic kidney disease: Secondary | ICD-10-CM | POA: Diagnosis not present

## 2020-02-04 DIAGNOSIS — D689 Coagulation defect, unspecified: Secondary | ICD-10-CM | POA: Diagnosis not present

## 2020-02-04 DIAGNOSIS — N2581 Secondary hyperparathyroidism of renal origin: Secondary | ICD-10-CM | POA: Diagnosis not present

## 2020-02-04 DIAGNOSIS — Z992 Dependence on renal dialysis: Secondary | ICD-10-CM | POA: Diagnosis not present

## 2020-02-04 DIAGNOSIS — N186 End stage renal disease: Secondary | ICD-10-CM | POA: Diagnosis not present

## 2020-02-06 DIAGNOSIS — D631 Anemia in chronic kidney disease: Secondary | ICD-10-CM | POA: Diagnosis not present

## 2020-02-06 DIAGNOSIS — D689 Coagulation defect, unspecified: Secondary | ICD-10-CM | POA: Diagnosis not present

## 2020-02-06 DIAGNOSIS — D508 Other iron deficiency anemias: Secondary | ICD-10-CM | POA: Diagnosis not present

## 2020-02-06 DIAGNOSIS — Z992 Dependence on renal dialysis: Secondary | ICD-10-CM | POA: Diagnosis not present

## 2020-02-06 DIAGNOSIS — N186 End stage renal disease: Secondary | ICD-10-CM | POA: Diagnosis not present

## 2020-02-06 DIAGNOSIS — N2581 Secondary hyperparathyroidism of renal origin: Secondary | ICD-10-CM | POA: Diagnosis not present

## 2020-02-07 ENCOUNTER — Other Ambulatory Visit: Payer: Self-pay

## 2020-02-07 ENCOUNTER — Ambulatory Visit: Payer: Medicare Other | Attending: Internal Medicine | Admitting: Physical Therapy

## 2020-02-07 VITALS — BP 115/66 | HR 101

## 2020-02-07 DIAGNOSIS — M79642 Pain in left hand: Secondary | ICD-10-CM | POA: Insufficient documentation

## 2020-02-07 DIAGNOSIS — M25641 Stiffness of right hand, not elsewhere classified: Secondary | ICD-10-CM | POA: Insufficient documentation

## 2020-02-07 DIAGNOSIS — M25642 Stiffness of left hand, not elsewhere classified: Secondary | ICD-10-CM | POA: Diagnosis not present

## 2020-02-07 DIAGNOSIS — M79641 Pain in right hand: Secondary | ICD-10-CM | POA: Diagnosis not present

## 2020-02-07 DIAGNOSIS — M6281 Muscle weakness (generalized): Secondary | ICD-10-CM | POA: Diagnosis not present

## 2020-02-07 DIAGNOSIS — R2681 Unsteadiness on feet: Secondary | ICD-10-CM | POA: Insufficient documentation

## 2020-02-07 NOTE — Therapy (Signed)
[place] MAIN [facility] SERVICES [address] [place], [place], [address] Phone: [phone]   Fax:  [phone]  Physical Therapy Treatment  Patient Details  Name: Kimberly Frost MRN: [id number] Date of Birth: Aug 03, 1950 Referring Provider (PT): Elmarie Shiley   Encounter Date: 02/07/2020   PT End of Session - 02/07/20 0759    Visit Number 11    Number of Visits 17    Date for PT Re-Evaluation 02/28/20    Authorization Type UHC medicare    PT Start Time 0800    PT Stop Time 0845    PT Time Calculation (min) 45 min    Equipment Utilized During Treatment Gait belt    Activity Tolerance Patient tolerated treatment well;No increased pain;Patient limited by fatigue    Behavior During Therapy [facility] for tasks assessed/performed           Past Medical History:  Diagnosis Date  . Chronic kidney disease   . Edema    RLE  . Hepatitis    Hep C  . History of blood transfusion   . Hypertension   . Wears dentures     Past Surgical History:  Procedure Laterality Date  . A/V FISTULAGRAM Left 04/11/2019   Procedure: A/V FISTULAGRAM;  Surgeon: Katha Cabal, MD;  Location: [place] [facility];  Service: Cardiovascular;  Laterality: Left;  . A/V FISTULAGRAM Left 05/01/2019   Procedure: A/V FISTULAGRAM;  Surgeon: Algernon Huxley, MD;  Location: [place] [facility];  Service: Cardiovascular;  Laterality: Left;  . A/V FISTULAGRAM Left 01/15/2020   Procedure: A/V FISTULAGRAM;  Surgeon: Algernon Huxley, MD;  Location: [place] [facility];  Service: Cardiovascular;  Laterality: Left;  . AV FISTULA PLACEMENT Left 06/16/2018   Procedure: Left Arm ARTERIOVENOUS (AV) FISTULA CREATION;  Surgeon: Marty Heck, MD;  Location: [place];  Service: Vascular;  Laterality: Left;  Marland Kitchen MULTIPLE TOOTH EXTRACTIONS    . MYOMECTOMY      Vitals:   02/07/20 0805  BP: 115/66  Pulse: (!) 101  SpO2: 100%     Subjective Assessment - 02/07/20 6759    Subjective Patient  reports she had a good day yesterday after dialysis and she feels great today. She denies of any falls or injuries since last therapy session.    Pertinent History [name] has been having dyalisis over a year and was having some problems wiht passing out due to BP regulation and was using a RW . Now she is not having the passing out symptoms. She has good days and bad days at the hospital. She is trying to get a kidney and the nurse thought she needed to have PT to have her use a RW. She is currently not using a RW. If she feels weak after dyalisis she uses a scooter at the store if she needs it.    Currently in Pain? No/denies           Treatment Today: Objective measures: Vitals: BP at start of session 115/66, HR 101  Nustep x L1 x 5 mins Step over orange hurdle x forward/sideways x 10 reps each leg   Sit to stands x 10, x 7, x 5   Airex x normal stance x throwing darts  Airex x WBOS x throwing darts Airex x NBOS x throwing darts  KoreBalance:  Penguin race x 3 reps         PT Short Term Goals - 01/31/20 [phone]  PT SHORT TERM GOAL #1   Title Patient will be independent in home exercise program to improve strength/mobility for better functional independence with ADLs.    Time 8    Period Weeks    Status Partially Met    Target Date 02/28/20      PT SHORT TERM GOAL #2   Title Patient will increase BLE gross strength to 4+/5 as to improve functional strength for independent gait, increased standing tolerance and increased ADL ability.    Time 8    Period Weeks    Status Partially Met    Target Date 02/28/20      PT SHORT TERM GOAL #3   Title Patient will ascend/descend 4 stairs without rail assist independently without loss of balance to improve ability to get in/out of home.    Baseline 12/29: able to perform 4 stairs x3 with b/l rail today and no loss of balance    Time 8    Period Weeks    Status Achieved    Target Date 01/10/20             PT Long Term  Goals - 01/31/20 0947      PT LONG TERM GOAL #1   Title --    Time 8    Period Weeks    Status Partially Met      PT LONG TERM GOAL #2   Title Patient will ascend/descend 4 stairs without rail assist independently without loss of balance to improve ability to get in/out of home.    Baseline see above    Time 8    Period Weeks    Status Achieved      PT LONG TERM GOAL #3   Title Patient will increase 10 meter walk test to >1.15ms as to improve gait speed for better community ambulation and to reduce fall risk.    Baseline 12/13/19= .833mec    Time 8    Period Weeks    Status New                 Plan - 02/07/20 [id number]  Clinical Impression Statement Patient completed strengthening exercises with increased muscle fatigue with activity. Patient required multiple seated rest breaks due to muscle fatigue. Patient demonstrates heavy BUE support on KoreBalance exercises with decreased weight bearing on RLE. Therapist provided constant verbal cues to shift weight to RLE to avoid obstacles in game. Patient demonstrates improvement with increased weight shifting and motor control with increased repetitions. Patient will continue to benefit from skilled physical therapy to improve generalized strength, ROM, and capacity for functional activity.    Personal Factors and Comorbidities Comorbidity 1;Comorbidity 2;Comorbidity 3+    Comorbidities Patient has PMHx :Hyperparathyroidism, HTN Hep C: Anemia ESRD: She gets dialysis 3 x week. She follows with nephrology. [facility].    Stability/Clinical Decision Making Stable/Uncomplicated    Rehab Potential Good    PT Frequency 1x / week    PT Duration 8 weeks    PT Treatment/Interventions Therapeutic exercise;Therapeutic activities    PT Next Visit Plan continue to progress strength and activity tolerance    PT Home Exercise Plan no updates this session    Consulted and Agree with  Plan of Care Patient           Patient will benefit from skilled therapeutic intervention in order to improve the following deficits and impairments:  Abnormal gait,Decreased activity  tolerance,Difficulty walking  Visit Diagnosis: Gait instability  Muscle weakness (generalized)     Problem List Patient Active Problem List   Diagnosis Date Noted  . A-V fistula ([place]) 04/26/2019  . ESRD (end stage renal disease) ([place]) 10/26/2018  . Secondary hyperparathyroidism of renal origin ([place]) 09/06/2018  . Anemia due to stage 5 chronic kidney disease ([place]) 02/26/2018  . Hepatitis C 02/15/2013  . Essential hypertension, benign 12/26/2008   Karl Luke PT, DPT Netta Corrigan 02/07/2020, 8:55 AM  [place] MAIN [facility] SERVICES [address] [place], [place], [address] Phone: [phone]   Fax:  [phone]  Name: AMRITA RADU MRN: [id number] Date of Birth: 11-08-1950

## 2020-02-08 DIAGNOSIS — N186 End stage renal disease: Secondary | ICD-10-CM | POA: Diagnosis not present

## 2020-02-08 DIAGNOSIS — D508 Other iron deficiency anemias: Secondary | ICD-10-CM | POA: Diagnosis not present

## 2020-02-08 DIAGNOSIS — Z992 Dependence on renal dialysis: Secondary | ICD-10-CM | POA: Diagnosis not present

## 2020-02-08 DIAGNOSIS — D631 Anemia in chronic kidney disease: Secondary | ICD-10-CM | POA: Diagnosis not present

## 2020-02-08 DIAGNOSIS — N2581 Secondary hyperparathyroidism of renal origin: Secondary | ICD-10-CM | POA: Diagnosis not present

## 2020-02-08 DIAGNOSIS — D689 Coagulation defect, unspecified: Secondary | ICD-10-CM | POA: Diagnosis not present

## 2020-02-10 DIAGNOSIS — D689 Coagulation defect, unspecified: Secondary | ICD-10-CM | POA: Diagnosis not present

## 2020-02-10 DIAGNOSIS — D508 Other iron deficiency anemias: Secondary | ICD-10-CM | POA: Diagnosis not present

## 2020-02-10 DIAGNOSIS — Z992 Dependence on renal dialysis: Secondary | ICD-10-CM | POA: Diagnosis not present

## 2020-02-10 DIAGNOSIS — D631 Anemia in chronic kidney disease: Secondary | ICD-10-CM | POA: Diagnosis not present

## 2020-02-10 DIAGNOSIS — N186 End stage renal disease: Secondary | ICD-10-CM | POA: Diagnosis not present

## 2020-02-10 DIAGNOSIS — N2581 Secondary hyperparathyroidism of renal origin: Secondary | ICD-10-CM | POA: Diagnosis not present

## 2020-02-13 DIAGNOSIS — Z992 Dependence on renal dialysis: Secondary | ICD-10-CM | POA: Diagnosis not present

## 2020-02-13 DIAGNOSIS — D508 Other iron deficiency anemias: Secondary | ICD-10-CM | POA: Diagnosis not present

## 2020-02-13 DIAGNOSIS — D689 Coagulation defect, unspecified: Secondary | ICD-10-CM | POA: Diagnosis not present

## 2020-02-13 DIAGNOSIS — N186 End stage renal disease: Secondary | ICD-10-CM | POA: Diagnosis not present

## 2020-02-13 DIAGNOSIS — D631 Anemia in chronic kidney disease: Secondary | ICD-10-CM | POA: Diagnosis not present

## 2020-02-13 DIAGNOSIS — N2581 Secondary hyperparathyroidism of renal origin: Secondary | ICD-10-CM | POA: Diagnosis not present

## 2020-02-14 ENCOUNTER — Other Ambulatory Visit: Payer: Self-pay

## 2020-02-14 ENCOUNTER — Ambulatory Visit: Payer: Medicare Other | Admitting: Physical Therapy

## 2020-02-14 DIAGNOSIS — M79641 Pain in right hand: Secondary | ICD-10-CM | POA: Diagnosis not present

## 2020-02-14 DIAGNOSIS — M79642 Pain in left hand: Secondary | ICD-10-CM

## 2020-02-14 DIAGNOSIS — M6281 Muscle weakness (generalized): Secondary | ICD-10-CM | POA: Diagnosis not present

## 2020-02-14 DIAGNOSIS — M25641 Stiffness of right hand, not elsewhere classified: Secondary | ICD-10-CM | POA: Diagnosis not present

## 2020-02-14 DIAGNOSIS — M25642 Stiffness of left hand, not elsewhere classified: Secondary | ICD-10-CM

## 2020-02-14 DIAGNOSIS — R2681 Unsteadiness on feet: Secondary | ICD-10-CM

## 2020-02-14 NOTE — Therapy (Signed)
[place] MAIN [facility] SERVICES [address] [place], [place], [address] Phone: [phone]   Fax:  [phone]  Physical Therapy Treatment  Patient Details  Name: Kimberly Frost MRN: [id number] Date of Birth: 10-23-1950 Referring Provider (PT): Elmarie Shiley   Encounter Date: 02/14/2020   PT End of Session - 02/14/20 0807    Visit Number 12    Number of Visits 17    Date for PT Re-Evaluation 02/28/20    Authorization Type UHC medicare    PT Start Time 0800    PT Stop Time 0845    PT Time Calculation (min) 45 min    Equipment Utilized During Treatment Gait belt    Activity Tolerance Patient tolerated treatment well;No increased pain;Patient limited by fatigue    Behavior During Therapy [facility] for tasks assessed/performed           Past Medical History:  Diagnosis Date  . Chronic kidney disease   . Edema    RLE  . Hepatitis    Hep C  . History of blood transfusion   . Hypertension   . Wears dentures     Past Surgical History:  Procedure Laterality Date  . A/V FISTULAGRAM Left 04/11/2019   Procedure: A/V FISTULAGRAM;  Surgeon: Katha Cabal, MD;  Location: [place] [facility];  Service: Cardiovascular;  Laterality: Left;  . A/V FISTULAGRAM Left 05/01/2019   Procedure: A/V FISTULAGRAM;  Surgeon: Algernon Huxley, MD;  Location: [place] [facility];  Service: Cardiovascular;  Laterality: Left;  . A/V FISTULAGRAM Left 01/15/2020   Procedure: A/V FISTULAGRAM;  Surgeon: Algernon Huxley, MD;  Location: [place] [facility];  Service: Cardiovascular;  Laterality: Left;  . AV FISTULA PLACEMENT Left 06/16/2018   Procedure: Left Arm ARTERIOVENOUS (AV) FISTULA CREATION;  Surgeon: Marty Heck, MD;  Location: [place];  Service: Vascular;  Laterality: Left;  Marland Kitchen MULTIPLE TOOTH EXTRACTIONS    . MYOMECTOMY      There were no vitals filed for this visit.   Subjective Assessment - 02/14/20 0806    Subjective Patient reports that her doctor told  her that her chances of being on the kidney transplant list has decreased since she had recent changes in frequency of BP medications.    Pertinent History [name] has been having dyalisis over a year and was having some problems wiht passing out due to BP regulation and was using a RW . Now she is not having the passing out symptoms. She has good days and bad days at the hospital. She is trying to get a kidney and the nurse thought she needed to have PT to have her use a RW. She is currently not using a RW. If she feels weak after dyalisis she uses a scooter at the store if she needs it.    Currently in Pain? No/denies            Treatment Today: Objective measures: Vitals: BP at start of session 119/79, HR 106  Nustep x L1 x 5 mins Step over orange hurdle x forward/sideways x 10 reps each leg   All exercises were completed with 2.5#:  Seated LAQ x 25 reps each leg  Standing hip abduction x 25 reps each leg  Standing hip extension x 25 reps each leg  Standing hamstring curls x 25 reps each leg   Airex x normal stance x EO x 2 30s Airex x WBOS x EO x 2 30s Airex x NBOS  x EO x 2 30s Airex beam x tandem walking x 3 laps without BUE support Airex beam x side stepping x 3 laps without BUE support    Sit to stands x 10, x 7, x 5     PT Short Term Goals - 01/31/20 0946      PT SHORT TERM GOAL #1   Title Patient will be independent in home exercise program to improve strength/mobility for better functional independence with ADLs.    Time 8    Period Weeks    Status Partially Met    Target Date 02/28/20      PT SHORT TERM GOAL #2   Title Patient will increase BLE gross strength to 4+/5 as to improve functional strength for independent gait, increased standing tolerance and increased ADL ability.    Time 8    Period Weeks    Status Partially Met    Target Date 02/28/20      PT SHORT TERM GOAL #3   Title Patient will ascend/descend 4 stairs without rail assist independently  without loss of balance to improve ability to get in/out of home.    Baseline 12/29: able to perform 4 stairs x3 with b/l rail today and no loss of balance    Time 8    Period Weeks    Status Achieved    Target Date 01/10/20             PT Long Term Goals - 01/31/20 0947      PT LONG TERM GOAL #1   Title --    Time 8    Period Weeks    Status Partially Met      PT LONG TERM GOAL #2   Title Patient will ascend/descend 4 stairs without rail assist independently without loss of balance to improve ability to get in/out of home.    Baseline see above    Time 8    Period Weeks    Status Achieved      PT LONG TERM GOAL #3   Title Patient will increase 10 meter walk test to >1.73ms as to improve gait speed for better community ambulation and to reduce fall risk.    Baseline 12/13/19= .825mec    Time 8    Period Weeks    Status New                 Plan - 02/14/20 0845    Clinical Impression Statement Patient completed strengthening exercises with fair tolerance to actiivity. Therapist provided verbal cues to maintain spine in neutral when completing standing hip exercises. She demonstrates increased postural swaying with balance exercises due to decreased proprioception cues. Patient would benefit form continued PT services to increase strength, mobility, and balance skills to improve patient's quality of life.    Personal Factors and Comorbidities Comorbidity 1;Comorbidity 2;Comorbidity 3+    Comorbidities Patient has PMHx :Hyperparathyroidism, HTN Hep C: Anemia ESRD: She gets dialysis 3 x week. She follows with nephrology. [facility]    Stability/Clinical Decision Making Stable/Uncomplicated    Rehab Potential Good    PT Frequency 1x / week    PT Duration 8 weeks    PT Treatment/Interventions Therapeutic exercise;Therapeutic activities    PT Next Visit Plan continue to progress strength and activity  tolerance    PT Home Exercise Plan no updates this session    Consulted and Agree with Plan of Care Patient  Patient will benefit from skilled therapeutic intervention in order to improve the following deficits and impairments:  Abnormal gait,Decreased activity tolerance,Difficulty walking  Visit Diagnosis: Muscle weakness (generalized)  Gait instability  Stiffness of left hand, not elsewhere classified  Stiffness of right hand, not elsewhere classified  Pain in left hand  Pain in right hand     Problem List Patient Active Problem List   Diagnosis Date Noted  . A-V fistula ([place]) 04/26/2019  . ESRD (end stage renal disease) ([place]) 10/26/2018  . Secondary hyperparathyroidism of renal origin ([place]) 09/06/2018  . Anemia due to stage 5 chronic kidney disease ([place]) 02/26/2018  . Hepatitis C 02/15/2013  . Essential hypertension, benign 12/26/2008   Karl Luke PT, DPT Netta Corrigan 02/14/2020, 8:50 AM  [place] MAIN [facility] SERVICES [address] [place], [place], [address] Phone: [phone]   Fax:  [phone]  Name: MADALIN HUGHART MRN: [id number] Date of Birth: 11-Feb-1950

## 2020-02-15 DIAGNOSIS — N2581 Secondary hyperparathyroidism of renal origin: Secondary | ICD-10-CM | POA: Diagnosis not present

## 2020-02-15 DIAGNOSIS — D631 Anemia in chronic kidney disease: Secondary | ICD-10-CM | POA: Diagnosis not present

## 2020-02-15 DIAGNOSIS — D508 Other iron deficiency anemias: Secondary | ICD-10-CM | POA: Diagnosis not present

## 2020-02-15 DIAGNOSIS — Z992 Dependence on renal dialysis: Secondary | ICD-10-CM | POA: Diagnosis not present

## 2020-02-15 DIAGNOSIS — D689 Coagulation defect, unspecified: Secondary | ICD-10-CM | POA: Diagnosis not present

## 2020-02-15 DIAGNOSIS — N186 End stage renal disease: Secondary | ICD-10-CM | POA: Diagnosis not present

## 2020-02-17 DIAGNOSIS — N186 End stage renal disease: Secondary | ICD-10-CM | POA: Diagnosis not present

## 2020-02-17 DIAGNOSIS — D689 Coagulation defect, unspecified: Secondary | ICD-10-CM | POA: Diagnosis not present

## 2020-02-17 DIAGNOSIS — Z992 Dependence on renal dialysis: Secondary | ICD-10-CM | POA: Diagnosis not present

## 2020-02-17 DIAGNOSIS — D631 Anemia in chronic kidney disease: Secondary | ICD-10-CM | POA: Diagnosis not present

## 2020-02-17 DIAGNOSIS — D508 Other iron deficiency anemias: Secondary | ICD-10-CM | POA: Diagnosis not present

## 2020-02-17 DIAGNOSIS — N2581 Secondary hyperparathyroidism of renal origin: Secondary | ICD-10-CM | POA: Diagnosis not present

## 2020-02-21 ENCOUNTER — Ambulatory Visit: Payer: Medicare Other

## 2020-02-22 DIAGNOSIS — D689 Coagulation defect, unspecified: Secondary | ICD-10-CM | POA: Diagnosis not present

## 2020-02-22 DIAGNOSIS — D508 Other iron deficiency anemias: Secondary | ICD-10-CM | POA: Diagnosis not present

## 2020-02-22 DIAGNOSIS — D631 Anemia in chronic kidney disease: Secondary | ICD-10-CM | POA: Diagnosis not present

## 2020-02-22 DIAGNOSIS — Z992 Dependence on renal dialysis: Secondary | ICD-10-CM | POA: Diagnosis not present

## 2020-02-22 DIAGNOSIS — N186 End stage renal disease: Secondary | ICD-10-CM | POA: Diagnosis not present

## 2020-02-22 DIAGNOSIS — N2581 Secondary hyperparathyroidism of renal origin: Secondary | ICD-10-CM | POA: Diagnosis not present

## 2020-02-24 DIAGNOSIS — N2581 Secondary hyperparathyroidism of renal origin: Secondary | ICD-10-CM | POA: Diagnosis not present

## 2020-02-24 DIAGNOSIS — N186 End stage renal disease: Secondary | ICD-10-CM | POA: Diagnosis not present

## 2020-02-24 DIAGNOSIS — D689 Coagulation defect, unspecified: Secondary | ICD-10-CM | POA: Diagnosis not present

## 2020-02-24 DIAGNOSIS — Z992 Dependence on renal dialysis: Secondary | ICD-10-CM | POA: Diagnosis not present

## 2020-02-24 DIAGNOSIS — D508 Other iron deficiency anemias: Secondary | ICD-10-CM | POA: Diagnosis not present

## 2020-02-24 DIAGNOSIS — D631 Anemia in chronic kidney disease: Secondary | ICD-10-CM | POA: Diagnosis not present

## 2020-02-27 DIAGNOSIS — D631 Anemia in chronic kidney disease: Secondary | ICD-10-CM | POA: Diagnosis not present

## 2020-02-27 DIAGNOSIS — N186 End stage renal disease: Secondary | ICD-10-CM | POA: Diagnosis not present

## 2020-02-27 DIAGNOSIS — Z992 Dependence on renal dialysis: Secondary | ICD-10-CM | POA: Diagnosis not present

## 2020-02-27 DIAGNOSIS — N2581 Secondary hyperparathyroidism of renal origin: Secondary | ICD-10-CM | POA: Diagnosis not present

## 2020-02-27 DIAGNOSIS — D508 Other iron deficiency anemias: Secondary | ICD-10-CM | POA: Diagnosis not present

## 2020-02-27 DIAGNOSIS — D689 Coagulation defect, unspecified: Secondary | ICD-10-CM | POA: Diagnosis not present

## 2020-02-28 ENCOUNTER — Other Ambulatory Visit: Payer: Self-pay

## 2020-02-28 ENCOUNTER — Ambulatory Visit: Payer: Medicare Other | Admitting: Physical Therapy

## 2020-02-28 DIAGNOSIS — R2681 Unsteadiness on feet: Secondary | ICD-10-CM | POA: Diagnosis not present

## 2020-02-28 DIAGNOSIS — M79641 Pain in right hand: Secondary | ICD-10-CM | POA: Diagnosis not present

## 2020-02-28 DIAGNOSIS — M79642 Pain in left hand: Secondary | ICD-10-CM | POA: Diagnosis not present

## 2020-02-28 DIAGNOSIS — M25641 Stiffness of right hand, not elsewhere classified: Secondary | ICD-10-CM | POA: Diagnosis not present

## 2020-02-28 DIAGNOSIS — M25642 Stiffness of left hand, not elsewhere classified: Secondary | ICD-10-CM | POA: Diagnosis not present

## 2020-02-28 DIAGNOSIS — M6281 Muscle weakness (generalized): Secondary | ICD-10-CM

## 2020-02-28 NOTE — Therapy (Signed)
[place] MAIN [facility] SERVICES [address] [place], [place], [address] Phone: [phone]   Fax:  [phone]  Physical Therapy Treatment/Recertification  Patient Details  Name: Kimberly Frost MRN: [id number] Date of Birth: [birth year] Referring Provider (PT): Elmarie Shiley   Encounter Date: 02/28/2020   PT End of Session - 02/28/20 0757    Visit Number 13    Number of Visits 17    Date for PT Re-Evaluation 04/24/20    Authorization Type UHC medicare    PT Start Time 0800    PT Stop Time 0845    PT Time Calculation (min) 45 min    Equipment Utilized During Treatment Gait belt    Activity Tolerance Patient tolerated treatment well;No increased pain;Patient limited by fatigue    Behavior During Therapy [facility] for tasks assessed/performed           Past Medical History:  Diagnosis Date  . Chronic kidney disease   . Edema    RLE  . Hepatitis    Hep C  . History of blood transfusion   . Hypertension   . Wears dentures     Past Surgical History:  Procedure Laterality Date  . A/V FISTULAGRAM Left 04/11/2019   Procedure: A/V FISTULAGRAM;  Surgeon: Katha Cabal, MD;  Location: [place] [facility];  Service: Cardiovascular;  Laterality: Left;  . A/V FISTULAGRAM Left 05/01/2019   Procedure: A/V FISTULAGRAM;  Surgeon: Algernon Huxley, MD;  Location: [place] [facility];  Service: Cardiovascular;  Laterality: Left;  . A/V FISTULAGRAM Left 01/15/2020   Procedure: A/V FISTULAGRAM;  Surgeon: Algernon Huxley, MD;  Location: [place] [facility];  Service: Cardiovascular;  Laterality: Left;  . AV FISTULA PLACEMENT Left 06/16/2018   Procedure: Left Arm ARTERIOVENOUS (AV) FISTULA CREATION;  Surgeon: Marty Heck, MD;  Location: [place];  Service: Vascular;  Laterality: Left;  Marland Kitchen MULTIPLE TOOTH EXTRACTIONS    . MYOMECTOMY      There were no vitals filed for this visit.   Subjective Assessment - 02/28/20 0806    Subjective Patient reports that  she had to miss one session of her dialysis last week and thinks she's weak from that. She was unable attend last week's therapy appointment due to weather conditions. She denies of any falls or injuries since last therapy session.    Pertinent History [name] has been having dyalisis over a year and was having some problems wiht passing out due to BP regulation and was using a RW . Now she is not having the passing out symptoms. She has good days and bad days at the hospital. She is trying to get a kidney and the nurse thought she needed to have PT to have her use a RW. She is currently not using a RW. If she feels weak after dyalisis she uses a scooter at the store if she needs it.    Currently in Pain? No/denies          BP: 123/76 SaO2: 100 HR: 102  Clinical Impression: Updated patient's goals and outcome measures in today's session. Patient demonstrates improvement in her gait speed of 1.36 m/s on her 10MWT. Patient continues to have deficits in her BLE strength and balance as evidenced by completing 5STS in 18 seconds. She also demonstrates decreased confidence with ADL as evidenced by score of 76%. Patient's condition has the potential to improve in response to therapy. Maximum improvement is yet to be obtained. Patient will continue  to benefit from skilled physical therapy to improve generalized strength, ROM, and capacity for functional activity.      There ex: Nu Step x L1 x 5 mins precor leg press x 40# x 2 10 reps  Precor calf raise x 25# x 2 10 reps                       PT Short Term Goals - 02/28/20 1123      PT SHORT TERM GOAL #1   Title Patient will be independent in home exercise program to improve strength/mobility for better functional independence with ADLs.    Time 4    Period Weeks    Status Partially Met    Target Date 03/27/20      PT SHORT TERM GOAL #2   Title Patient will increase BLE gross strength to 4+/5 as to improve functional strength  for independent gait, increased standing tolerance and increased ADL ability.    Baseline 01/26: 4/5    Time 4    Period Weeks    Status Partially Met    Target Date 03/27/20      PT SHORT TERM GOAL #3   Title Patient will ascend/descend 4 stairs without rail assist independently without loss of balance to improve ability to get in/out of home.    Baseline 12/29: able to perform 4 stairs x3 with b/l rail today and no loss of balance    Time 8    Period Weeks    Status Achieved    Target Date 01/10/20             PT Long Term Goals - 02/28/20 0809      PT LONG TERM GOAL #1   Time 8    Period Weeks    Status Partially Met      PT LONG TERM GOAL #2   Title Patient will ascend/descend 4 stairs without rail assist independently without loss of balance to improve ability to get in/out of home.    Baseline see above    Time 8    Period Weeks    Status Achieved      PT LONG TERM GOAL #3   Title Patient will increase 10 meter walk test to >1.14ms as to improve gait speed for better community ambulation and to reduce fall risk.    Baseline 12/13/19= .884mec, 01/26: 1.36 m/sec    Time 8    Period Weeks    Status Achieved      PT LONG TERM GOAL #4   Title Patient (> 6043ears old) will complete five times sit to stand test in < 15 seconds indicating an increased LE strength and improved balance.    Baseline 01/26: 18s    Time 8    Period Weeks    Status New    Target Date 04/24/20      PT LONG TERM GOAL #5   Title Patient will increase ABC scale score >80% to demonstrate better functional mobility and better confidence with ADLs    Baseline 01/26: 76%    Time 8    Period Weeks    Status New    Target Date 04/24/20                 Plan - 02/28/20 1117    Clinical Impression Statement Updated patient's goals and outcome measures in today's session. Patient demonstrates improvement in her gait speed of 1.36 m/s on her 10MWT. Patient  continues to have deficits in  her BLE strength and balance as evidenced by completing 5STS in 18 seconds. She also demonstrates decreased confidence with ADL as evidenced by score of 76%. Patient's condition has the potential to improve in response to therapy. Maximum improvement is yet to be obtained. Patient will continue to benefit from skilled physical therapy to improve generalized strength, ROM, and capacity for functional activity.    Personal Factors and Comorbidities Comorbidity 1;Comorbidity 2;Comorbidity 3+    Comorbidities Patient has PMHx :Hyperparathyroidism, HTN Hep C: Anemia ESRD: She gets dialysis 3 x week. She follows with nephrology. She is currently waiting to be [facility] at [facility] for a kidney transplant.    Stability/Clinical Decision Making Stable/Uncomplicated    Rehab Potential Good    PT Frequency 1x / week    PT Duration 8 weeks    PT Treatment/Interventions Therapeutic exercise;Therapeutic activities    PT Next Visit Plan continue to progress strength and activity tolerance    PT Home Exercise Plan no updates this session    Consulted and Agree with Plan of Care Patient           Patient will benefit from skilled therapeutic intervention in order to improve the following deficits and impairments:  Abnormal gait,Decreased activity tolerance,Difficulty walking  Visit Diagnosis: Muscle weakness (generalized)  Gait instability     Problem List Patient Active Problem List   Diagnosis Date Noted  . A-V fistula ([place]) 04/26/2019  . ESRD (end stage renal disease) ([place]) 10/26/2018  . Secondary hyperparathyroidism of renal origin ([place]) 09/06/2018  . Anemia due to stage 5 chronic kidney disease ([place]) 02/26/2018  . Hepatitis C 02/15/2013  . Essential hypertension, benign 12/26/2008   Karl Luke PT, DPT Netta Corrigan 02/28/2020, 11:25 AM  [place] MAIN [facility] SERVICES [address] [place], [place], [address] Phone: [phone]   Fax:   [phone]  Name: Kimberly Frost MRN: [id number] Date of Birth: [birth year]

## 2020-02-29 DIAGNOSIS — D689 Coagulation defect, unspecified: Secondary | ICD-10-CM | POA: Diagnosis not present

## 2020-02-29 DIAGNOSIS — Z992 Dependence on renal dialysis: Secondary | ICD-10-CM | POA: Diagnosis not present

## 2020-02-29 DIAGNOSIS — D508 Other iron deficiency anemias: Secondary | ICD-10-CM | POA: Diagnosis not present

## 2020-02-29 DIAGNOSIS — N186 End stage renal disease: Secondary | ICD-10-CM | POA: Diagnosis not present

## 2020-02-29 DIAGNOSIS — N2581 Secondary hyperparathyroidism of renal origin: Secondary | ICD-10-CM | POA: Diagnosis not present

## 2020-02-29 DIAGNOSIS — D631 Anemia in chronic kidney disease: Secondary | ICD-10-CM | POA: Diagnosis not present

## 2020-03-02 DIAGNOSIS — D508 Other iron deficiency anemias: Secondary | ICD-10-CM | POA: Diagnosis not present

## 2020-03-02 DIAGNOSIS — N2581 Secondary hyperparathyroidism of renal origin: Secondary | ICD-10-CM | POA: Diagnosis not present

## 2020-03-02 DIAGNOSIS — Z992 Dependence on renal dialysis: Secondary | ICD-10-CM | POA: Diagnosis not present

## 2020-03-02 DIAGNOSIS — N186 End stage renal disease: Secondary | ICD-10-CM | POA: Diagnosis not present

## 2020-03-02 DIAGNOSIS — D631 Anemia in chronic kidney disease: Secondary | ICD-10-CM | POA: Diagnosis not present

## 2020-03-02 DIAGNOSIS — D689 Coagulation defect, unspecified: Secondary | ICD-10-CM | POA: Diagnosis not present

## 2020-03-04 DIAGNOSIS — I129 Hypertensive chronic kidney disease with stage 1 through stage 4 chronic kidney disease, or unspecified chronic kidney disease: Secondary | ICD-10-CM | POA: Diagnosis not present

## 2020-03-04 DIAGNOSIS — N186 End stage renal disease: Secondary | ICD-10-CM | POA: Diagnosis not present

## 2020-03-04 DIAGNOSIS — Z992 Dependence on renal dialysis: Secondary | ICD-10-CM | POA: Diagnosis not present

## 2020-03-05 DIAGNOSIS — D509 Iron deficiency anemia, unspecified: Secondary | ICD-10-CM | POA: Diagnosis not present

## 2020-03-05 DIAGNOSIS — D508 Other iron deficiency anemias: Secondary | ICD-10-CM | POA: Diagnosis not present

## 2020-03-05 DIAGNOSIS — N2581 Secondary hyperparathyroidism of renal origin: Secondary | ICD-10-CM | POA: Diagnosis not present

## 2020-03-05 DIAGNOSIS — D631 Anemia in chronic kidney disease: Secondary | ICD-10-CM | POA: Diagnosis not present

## 2020-03-05 DIAGNOSIS — D689 Coagulation defect, unspecified: Secondary | ICD-10-CM | POA: Diagnosis not present

## 2020-03-05 DIAGNOSIS — Z992 Dependence on renal dialysis: Secondary | ICD-10-CM | POA: Diagnosis not present

## 2020-03-05 DIAGNOSIS — N186 End stage renal disease: Secondary | ICD-10-CM | POA: Diagnosis not present

## 2020-03-06 ENCOUNTER — Ambulatory Visit: Payer: Medicare Other | Attending: Internal Medicine | Admitting: Physical Therapy

## 2020-03-06 DIAGNOSIS — M6281 Muscle weakness (generalized): Secondary | ICD-10-CM | POA: Diagnosis not present

## 2020-03-06 DIAGNOSIS — R2681 Unsteadiness on feet: Secondary | ICD-10-CM | POA: Diagnosis not present

## 2020-03-06 NOTE — Therapy (Signed)
[place] MAIN [facility] SERVICES [address] [place], [place], [address] Phone: [phone]   Fax:  [phone]  Physical Therapy Treatment  Patient Details  Name: Kimberly Frost MRN: [id number] Date of Birth: 05-05-1950 Referring Provider (PT): Elmarie Shiley   Encounter Date: 03/06/2020   PT End of Session - 03/06/20 0807    Visit Number 14    Number of Visits 17    Date for PT Re-Evaluation 04/24/20    Authorization Type UHC medicare    PT Start Time 0800    PT Stop Time 0845    PT Time Calculation (min) 45 min    Equipment Utilized During Treatment Gait belt    Activity Tolerance Patient tolerated treatment well;No increased pain;Patient limited by fatigue    Behavior During Therapy [facility] for tasks assessed/performed           Past Medical History:  Diagnosis Date  . Chronic kidney disease   . Edema    RLE  . Hepatitis    Hep C  . History of blood transfusion   . Hypertension   . Wears dentures     Past Surgical History:  Procedure Laterality Date  . A/V FISTULAGRAM Left 04/11/2019   Procedure: A/V FISTULAGRAM;  Surgeon: Katha Cabal, MD;  Location: [place] [facility];  Service: Cardiovascular;  Laterality: Left;  . A/V FISTULAGRAM Left 05/01/2019   Procedure: A/V FISTULAGRAM;  Surgeon: Algernon Huxley, MD;  Location: [place] [facility];  Service: Cardiovascular;  Laterality: Left;  . A/V FISTULAGRAM Left 01/15/2020   Procedure: A/V FISTULAGRAM;  Surgeon: Algernon Huxley, MD;  Location: [place] [facility];  Service: Cardiovascular;  Laterality: Left;  . AV FISTULA PLACEMENT Left 06/16/2018   Procedure: Left Arm ARTERIOVENOUS (AV) FISTULA CREATION;  Surgeon: Marty Heck, MD;  Location: [place];  Service: Vascular;  Laterality: Left;  Marland Kitchen MULTIPLE TOOTH EXTRACTIONS    . MYOMECTOMY      There were no vitals filed for this visit.   Subjective Assessment - 03/06/20 0901    Subjective Patient reports that her doctor has  changed her BP medication from every day to only dialysis days. She reports that she will be going for a follow up with the kidney transplant organization next week so she will have to miss next week's therapy session.    Pertinent History [name] has been having dyalisis over a year and was having some problems wiht passing out due to BP regulation and was using a RW . Now she is not having the passing out symptoms. She has good days and bad days at the hospital. She is trying to get a kidney and the nurse thought she needed to have PT to have her use a RW. She is currently not using a RW. If she feels weak after dyalisis she uses a scooter at the store if she needs it.    Currently in Pain? No/denies          Seated Pre-Treatment Session:  BP: 124/76 HR: 102 SaO2: 99%  [place]ane: 5 mins  All exercises were completed with 3# ankle weights:  Seated marches x 30 reps each LE Seated LAQ x 30 reps each LE  Seated hip add squeezes x 3 10 reps Seated clamshells with GTB x 3 10 reps Step ups with stair x forward/sideways x 10 reps each LE  Seated after exercises BP: 108/62  Seated 5 mins break: BP: 103/77  Clinical Impression:  Patient completed strengthening exercises with good tolerance to activity today. Progressed patient's ankle weights to improve muscle recruitment with fair tolerance. Patient demonstrates muscle fatigue with exercises due to decreased functional capacity. Checked patient's BP after standing exercises in seated position with decreased BP reading. Patient will continue to benefit from skilled physical therapy to improve generalized strength, ROM, and capacity for functional activity.      PT Education - 03/06/20 0953    Education Details HEP: marches, Georjean Mode, glut squeezes    Methods Explanation;Demonstration    Comprehension Verbalized understanding            PT Short Term Goals - 02/28/20 1123      PT SHORT TERM GOAL #1   Title Patient will be independent in  home exercise program to improve strength/mobility for better functional independence with ADLs.    Time 4    Period Weeks    Status Partially Met    Target Date 03/27/20      PT SHORT TERM GOAL #2   Title Patient will increase BLE gross strength to 4+/5 as to improve functional strength for independent gait, increased standing tolerance and increased ADL ability.    Baseline 01/26: 4/5    Time 4    Period Weeks    Status Partially Met    Target Date 03/27/20      PT SHORT TERM GOAL #3   Title Patient will ascend/descend 4 stairs without rail assist independently without loss of balance to improve ability to get in/out of home.    Baseline 12/29: able to perform 4 stairs x3 with b/l rail today and no loss of balance    Time 8    Period Weeks    Status Achieved    Target Date 01/10/20             PT Long Term Goals - 02/28/20 0809      PT LONG TERM GOAL #1   Time 8    Period Weeks    Status Partially Met      PT LONG TERM GOAL #2   Title Patient will ascend/descend 4 stairs without rail assist independently without loss of balance to improve ability to get in/out of home.    Baseline see above    Time 8    Period Weeks    Status Achieved      PT LONG TERM GOAL #3   Title Patient will increase 10 meter walk test to >1.23ms as to improve gait speed for better community ambulation and to reduce fall risk.    Baseline 12/13/19= .859mec, 01/26: 1.36 m/sec    Time 8    Period Weeks    Status Achieved      PT LONG TERM GOAL #4   Title Patient (> 6034ears old) will complete five times sit to stand test in < 15 seconds indicating an increased LE strength and improved balance.    Baseline 01/26: 18s    Time 8    Period Weeks    Status New    Target Date 04/24/20      PT LONG TERM GOAL #5   Title Patient will increase ABC scale score >80% to demonstrate better functional mobility and better confidence with ADLs    Baseline 01/26: 76%    Time 8    Period Weeks     Status New    Target Date 04/24/20  Plan - 03/06/20 0900    Clinical Impression Statement Patient completed strengthening exercises with good tolerance to activity today. Progressed patient's ankle weights to improve muscle recruitment with fair tolerance. Patient demonstrates muscle fatigue with exercises due to decreased functional capacity. Checked patient's BP after standing exercises in seated position with decreased BP reading. Patient will continue to benefit from skilled physical therapy to improve generalized strength, ROM, and capacity for functional activity.    Personal Factors and Comorbidities Comorbidity 1;Comorbidity 2;Comorbidity 3+    Comorbidities Patient has PMHx :Hyperparathyroidism, HTN Hep C: Anemia ESRD: She gets dialysis 3 x week. She follows with nephrology. She is currently waiting to be listed at [facility] for a kidney transplant.    Stability/Clinical Decision Making Stable/Uncomplicated    Rehab Potential Good    PT Frequency 1x / week    PT Duration 8 weeks    PT Treatment/Interventions Therapeutic exercise;Therapeutic activities    PT Next Visit Plan continue to progress strength and activity tolerance    PT Home Exercise Plan no updates this session    Consulted and Agree with Plan of Care Patient           Patient will benefit from skilled therapeutic intervention in order to improve the following deficits and impairments:  Abnormal gait,Decreased activity tolerance,Difficulty walking  Visit Diagnosis: Muscle weakness (generalized)  Gait instability     Problem List Patient Active Problem List   Diagnosis Date Noted  . A-V fistula ([place]) 04/26/2019  . ESRD (end stage renal disease) ([place]) 10/26/2018  . Secondary hyperparathyroidism of renal origin ([place]) 09/06/2018  . Anemia due to stage 5 chronic kidney disease ([place]) 02/26/2018  . Hepatitis C 02/15/2013  . Essential hypertension, benign 12/26/2008   Karl Luke PT,  DPT Netta Corrigan 03/06/2020, 9:55 AM  [place] MAIN [facility] SERVICES [address] [place], [place], [address] Phone: [phone]   Fax:  [phone]  Name: Kimberly Frost MRN: [id number] Date of Birth: Dec 15, 1950

## 2020-03-07 DIAGNOSIS — D631 Anemia in chronic kidney disease: Secondary | ICD-10-CM | POA: Diagnosis not present

## 2020-03-07 DIAGNOSIS — D509 Iron deficiency anemia, unspecified: Secondary | ICD-10-CM | POA: Diagnosis not present

## 2020-03-07 DIAGNOSIS — N2581 Secondary hyperparathyroidism of renal origin: Secondary | ICD-10-CM | POA: Diagnosis not present

## 2020-03-07 DIAGNOSIS — N186 End stage renal disease: Secondary | ICD-10-CM | POA: Diagnosis not present

## 2020-03-07 DIAGNOSIS — D689 Coagulation defect, unspecified: Secondary | ICD-10-CM | POA: Diagnosis not present

## 2020-03-07 DIAGNOSIS — Z992 Dependence on renal dialysis: Secondary | ICD-10-CM | POA: Diagnosis not present

## 2020-03-07 DIAGNOSIS — D508 Other iron deficiency anemias: Secondary | ICD-10-CM | POA: Diagnosis not present

## 2020-03-09 DIAGNOSIS — D508 Other iron deficiency anemias: Secondary | ICD-10-CM | POA: Diagnosis not present

## 2020-03-09 DIAGNOSIS — Z992 Dependence on renal dialysis: Secondary | ICD-10-CM | POA: Diagnosis not present

## 2020-03-09 DIAGNOSIS — N186 End stage renal disease: Secondary | ICD-10-CM | POA: Diagnosis not present

## 2020-03-09 DIAGNOSIS — D689 Coagulation defect, unspecified: Secondary | ICD-10-CM | POA: Diagnosis not present

## 2020-03-09 DIAGNOSIS — D631 Anemia in chronic kidney disease: Secondary | ICD-10-CM | POA: Diagnosis not present

## 2020-03-09 DIAGNOSIS — N2581 Secondary hyperparathyroidism of renal origin: Secondary | ICD-10-CM | POA: Diagnosis not present

## 2020-03-09 DIAGNOSIS — D509 Iron deficiency anemia, unspecified: Secondary | ICD-10-CM | POA: Diagnosis not present

## 2020-03-12 DIAGNOSIS — D508 Other iron deficiency anemias: Secondary | ICD-10-CM | POA: Diagnosis not present

## 2020-03-12 DIAGNOSIS — N2581 Secondary hyperparathyroidism of renal origin: Secondary | ICD-10-CM | POA: Diagnosis not present

## 2020-03-12 DIAGNOSIS — D631 Anemia in chronic kidney disease: Secondary | ICD-10-CM | POA: Diagnosis not present

## 2020-03-12 DIAGNOSIS — D509 Iron deficiency anemia, unspecified: Secondary | ICD-10-CM | POA: Diagnosis not present

## 2020-03-12 DIAGNOSIS — N186 End stage renal disease: Secondary | ICD-10-CM | POA: Diagnosis not present

## 2020-03-12 DIAGNOSIS — D689 Coagulation defect, unspecified: Secondary | ICD-10-CM | POA: Diagnosis not present

## 2020-03-12 DIAGNOSIS — Z992 Dependence on renal dialysis: Secondary | ICD-10-CM | POA: Diagnosis not present

## 2020-03-13 ENCOUNTER — Ambulatory Visit: Payer: Medicare Other

## 2020-03-14 DIAGNOSIS — D508 Other iron deficiency anemias: Secondary | ICD-10-CM | POA: Diagnosis not present

## 2020-03-14 DIAGNOSIS — D509 Iron deficiency anemia, unspecified: Secondary | ICD-10-CM | POA: Diagnosis not present

## 2020-03-14 DIAGNOSIS — N186 End stage renal disease: Secondary | ICD-10-CM | POA: Diagnosis not present

## 2020-03-14 DIAGNOSIS — D631 Anemia in chronic kidney disease: Secondary | ICD-10-CM | POA: Diagnosis not present

## 2020-03-14 DIAGNOSIS — Z992 Dependence on renal dialysis: Secondary | ICD-10-CM | POA: Diagnosis not present

## 2020-03-14 DIAGNOSIS — D689 Coagulation defect, unspecified: Secondary | ICD-10-CM | POA: Diagnosis not present

## 2020-03-14 DIAGNOSIS — N2581 Secondary hyperparathyroidism of renal origin: Secondary | ICD-10-CM | POA: Diagnosis not present

## 2020-03-16 DIAGNOSIS — D509 Iron deficiency anemia, unspecified: Secondary | ICD-10-CM | POA: Diagnosis not present

## 2020-03-16 DIAGNOSIS — N2581 Secondary hyperparathyroidism of renal origin: Secondary | ICD-10-CM | POA: Diagnosis not present

## 2020-03-16 DIAGNOSIS — N186 End stage renal disease: Secondary | ICD-10-CM | POA: Diagnosis not present

## 2020-03-16 DIAGNOSIS — D689 Coagulation defect, unspecified: Secondary | ICD-10-CM | POA: Diagnosis not present

## 2020-03-16 DIAGNOSIS — D508 Other iron deficiency anemias: Secondary | ICD-10-CM | POA: Diagnosis not present

## 2020-03-16 DIAGNOSIS — Z992 Dependence on renal dialysis: Secondary | ICD-10-CM | POA: Diagnosis not present

## 2020-03-16 DIAGNOSIS — D631 Anemia in chronic kidney disease: Secondary | ICD-10-CM | POA: Diagnosis not present

## 2020-03-19 DIAGNOSIS — D508 Other iron deficiency anemias: Secondary | ICD-10-CM | POA: Diagnosis not present

## 2020-03-19 DIAGNOSIS — N186 End stage renal disease: Secondary | ICD-10-CM | POA: Diagnosis not present

## 2020-03-19 DIAGNOSIS — N2581 Secondary hyperparathyroidism of renal origin: Secondary | ICD-10-CM | POA: Diagnosis not present

## 2020-03-19 DIAGNOSIS — D689 Coagulation defect, unspecified: Secondary | ICD-10-CM | POA: Diagnosis not present

## 2020-03-19 DIAGNOSIS — Z992 Dependence on renal dialysis: Secondary | ICD-10-CM | POA: Diagnosis not present

## 2020-03-19 DIAGNOSIS — D631 Anemia in chronic kidney disease: Secondary | ICD-10-CM | POA: Diagnosis not present

## 2020-03-19 DIAGNOSIS — D509 Iron deficiency anemia, unspecified: Secondary | ICD-10-CM | POA: Diagnosis not present

## 2020-03-20 ENCOUNTER — Other Ambulatory Visit: Payer: Self-pay

## 2020-03-20 ENCOUNTER — Ambulatory Visit: Payer: Medicare Other

## 2020-03-20 ENCOUNTER — Telehealth: Payer: Self-pay

## 2020-03-20 DIAGNOSIS — M6281 Muscle weakness (generalized): Secondary | ICD-10-CM | POA: Diagnosis not present

## 2020-03-20 DIAGNOSIS — R2681 Unsteadiness on feet: Secondary | ICD-10-CM | POA: Diagnosis not present

## 2020-03-20 NOTE — Telephone Encounter (Signed)
Patient states she has not been able to keep the liquid prep and the tablet down. She states she throws them up so she is never clean out. Patient wants to know if she can take the Miralax prep with Gatorade and if she can she will rescheduled the colonoscopy

## 2020-03-20 NOTE — Therapy (Signed)
[place] MAIN [facility] SERVICES [address] [place], [place], [address] Phone: [phone]   Fax:  [phone]  Physical Therapy Treatment  Patient Details  Name: Kimberly Frost MRN: [id number] Date of Birth: 04-08-1950 Referring Provider (PT): Elmarie Shiley   Encounter Date: 03/20/2020   PT End of Session - 03/20/20 0810    Visit Number 15    Number of Visits 17    Date for PT Re-Evaluation 04/24/20    Authorization Type UHC medicare    PT Start Time 8889    PT Stop Time 0843    PT Time Calculation (min) 44 min    Equipment Utilized During Treatment Gait belt    Activity Tolerance Patient tolerated treatment well;No increased pain;Patient limited by fatigue    Behavior During Therapy [facility] for tasks assessed/performed           Past Medical History:  Diagnosis Date  . Chronic kidney disease   . Edema    RLE  . Hepatitis    Hep C  . History of blood transfusion   . Hypertension   . Wears dentures     Past Surgical History:  Procedure Laterality Date  . A/V FISTULAGRAM Left 04/11/2019   Procedure: A/V FISTULAGRAM;  Surgeon: Katha Cabal, MD;  Location: [place] [facility];  Service: Cardiovascular;  Laterality: Left;  . A/V FISTULAGRAM Left 05/01/2019   Procedure: A/V FISTULAGRAM;  Surgeon: Algernon Huxley, MD;  Location: [place] [facility];  Service: Cardiovascular;  Laterality: Left;  . A/V FISTULAGRAM Left 01/15/2020   Procedure: A/V FISTULAGRAM;  Surgeon: Algernon Huxley, MD;  Location: [place] [facility];  Service: Cardiovascular;  Laterality: Left;  . AV FISTULA PLACEMENT Left 06/16/2018   Procedure: Left Arm ARTERIOVENOUS (AV) FISTULA CREATION;  Surgeon: Marty Heck, MD;  Location: [place];  Service: Vascular;  Laterality: Left;  Marland Kitchen MULTIPLE TOOTH EXTRACTIONS    . MYOMECTOMY      There were no vitals filed for this visit.   Subjective Assessment - 03/20/20 0801    Subjective Patient reports she had her  medication injected yesterday. No falls or LOB, feels dizzy today because of her medication.    Pertinent History [name] has been having dyalisis over a year and was having some problems wiht passing out due to BP regulation and was using a RW . Now she is not having the passing out symptoms. She has good days and bad days at the hospital. She is trying to get a kidney and the nurse thought she needed to have PT to have her use a RW. She is currently not using a RW. If she feels weak after dyalisis she uses a scooter at the store if she needs it.    Currently in Pain? No/denies                Seated Pre-Treatment Session:  BP: 100/68   Standing:  BP: 86/62   pulse 102; Sp02 98  BP after exercise (marches): seated: 105/62    BP at end of session: seated 114/62 standing:  All exercises were completed with 3# ankle weights:  Seated marches x 15 reps each LE; x 2 sets  Seated LAQ x 15 reps  With 3 second holds alternating each LE Alternating ER/IR 15x each LE 6" step toe taps 15x each LE Heel toe raises 15x;   Seated:   Seated hip add squeezes x 3 10 reps Seated clamshells with  GTB x 20x reps GTB hamstring curl against PT resistance 15x each LE  Green ball between ankles: LAQ with adduction squeeze 12x GTB 4 way ankle 10x each direction       Pt educated throughout session about proper posture and technique with exercises. Improved exercise technique, movement at target joints, use of target muscles after min to mod verbal, visual, tactile cues.   Patient only performed in seated due to low BP in standing. BP improved as session progressed with seated strengthening interventions. He is motivated throughout physical therapy session. Patient educated on need to monitor BP between sessions. Patient will continue to benefit from skilled physical therapy to improve generalized strength, ROM, and capacity for functional activity.                PT Education - 03/20/20  0808    Education Details BP measures, exercise technique, body mechanics    Person(s) Educated Patient    Methods Explanation;Demonstration;Tactile cues;Verbal cues    Comprehension Verbalized understanding;Returned demonstration;Tactile cues required;Verbal cues required            PT Short Term Goals - 02/28/20 1123      PT SHORT TERM GOAL #1   Title Patient will be independent in home exercise program to improve strength/mobility for better functional independence with ADLs.    Time 4    Period Weeks    Status Partially Met    Target Date 03/27/20      PT SHORT TERM GOAL #2   Title Patient will increase BLE gross strength to 4+/5 as to improve functional strength for independent gait, increased standing tolerance and increased ADL ability.    Baseline 01/26: 4/5    Time 4    Period Weeks    Status Partially Met    Target Date 03/27/20      PT SHORT TERM GOAL #3   Title Patient will ascend/descend 4 stairs without rail assist independently without loss of balance to improve ability to get in/out of home.    Baseline 12/29: able to perform 4 stairs x3 with b/l rail today and no loss of balance    Time 8    Period Weeks    Status Achieved    Target Date 01/10/20             PT Long Term Goals - 02/28/20 0809      PT LONG TERM GOAL #1   Time 8    Period Weeks    Status Partially Met      PT LONG TERM GOAL #2   Title Patient will ascend/descend 4 stairs without rail assist independently without loss of balance to improve ability to get in/out of home.    Baseline see above    Time 8    Period Weeks    Status Achieved      PT LONG TERM GOAL #3   Title Patient will increase 10 meter walk test to >1.43ms as to improve gait speed for better community ambulation and to reduce fall risk.    Baseline 12/13/19= .862mec, 01/26: 1.36 m/sec    Time 8    Period Weeks    Status Achieved      PT LONG TERM GOAL #4   Title Patient (> 6037ears old) will complete five  times sit to stand test in < 15 seconds indicating an increased LE strength and improved balance.    Baseline 01/26: 18s    Time 8  Period Weeks    Status New    Target Date 04/24/20      PT LONG TERM GOAL #5   Title Patient will increase ABC scale score >80% to demonstrate better functional mobility and better confidence with ADLs    Baseline 01/26: 76%    Time 8    Period Weeks    Status New    Target Date 04/24/20                 Plan - 03/20/20 0815    Clinical Impression Statement Patient only performed in seated due to low BP in standing. BP improved as session progressed with seated strengthening interventions. He is motivated throughout physical therapy session. Patient educated on need to monitor BP between sessions. Patient will continue to benefit from skilled physical therapy to improve generalized strength, ROM, and capacity for functional activity.    Personal Factors and Comorbidities Comorbidity 1;Comorbidity 2;Comorbidity 3+    Comorbidities Patient has PMHx :Hyperparathyroidism, HTN Hep C: Anemia ESRD: She gets dialysis 3 x week. She follows with nephrology. She is currently waiting to be listed at [facility] for a kidney transplant.    Stability/Clinical Decision Making Stable/Uncomplicated    Rehab Potential Good    PT Frequency 1x / week    PT Duration 8 weeks    PT Treatment/Interventions Therapeutic exercise;Therapeutic activities    PT Next Visit Plan continue to progress strength and activity tolerance    PT Home Exercise Plan no updates this session    Consulted and Agree with Plan of Care Patient           Patient will benefit from skilled therapeutic intervention in order to improve the following deficits and impairments:  Abnormal gait,Decreased activity tolerance,Difficulty walking  Visit Diagnosis: Muscle weakness (generalized)  Gait instability     Problem List Patient Active Problem List   Diagnosis Date Noted  . A-V fistula  ([place]) 04/26/2019  . ESRD (end stage renal disease) ([place]) 10/26/2018  . Secondary hyperparathyroidism of renal origin ([place]) 09/06/2018  . Anemia due to stage 5 chronic kidney disease ([place]) 02/26/2018  . Hepatitis C 02/15/2013  . Essential hypertension, benign 12/26/2008   Janna Arch, PT, DPT   03/20/2020, 8:44 AM  [place] MAIN [facility] SERVICES [address] [place], [place], [address] Phone: [phone]   Fax:  [phone]  Name: Kimberly Frost MRN: [id number] Date of Birth: 1950/03/22

## 2020-03-21 DIAGNOSIS — D689 Coagulation defect, unspecified: Secondary | ICD-10-CM | POA: Diagnosis not present

## 2020-03-21 DIAGNOSIS — N186 End stage renal disease: Secondary | ICD-10-CM | POA: Diagnosis not present

## 2020-03-21 DIAGNOSIS — D509 Iron deficiency anemia, unspecified: Secondary | ICD-10-CM | POA: Diagnosis not present

## 2020-03-21 DIAGNOSIS — D631 Anemia in chronic kidney disease: Secondary | ICD-10-CM | POA: Diagnosis not present

## 2020-03-21 DIAGNOSIS — N2581 Secondary hyperparathyroidism of renal origin: Secondary | ICD-10-CM | POA: Diagnosis not present

## 2020-03-21 DIAGNOSIS — Z992 Dependence on renal dialysis: Secondary | ICD-10-CM | POA: Diagnosis not present

## 2020-03-21 DIAGNOSIS — D508 Other iron deficiency anemias: Secondary | ICD-10-CM | POA: Diagnosis not present

## 2020-03-21 NOTE — Telephone Encounter (Signed)
Please advised if Colonoscopy be done with Miralax

## 2020-03-23 DIAGNOSIS — Z992 Dependence on renal dialysis: Secondary | ICD-10-CM | POA: Diagnosis not present

## 2020-03-23 DIAGNOSIS — N2581 Secondary hyperparathyroidism of renal origin: Secondary | ICD-10-CM | POA: Diagnosis not present

## 2020-03-23 DIAGNOSIS — N186 End stage renal disease: Secondary | ICD-10-CM | POA: Diagnosis not present

## 2020-03-23 DIAGNOSIS — D689 Coagulation defect, unspecified: Secondary | ICD-10-CM | POA: Diagnosis not present

## 2020-03-23 DIAGNOSIS — D508 Other iron deficiency anemias: Secondary | ICD-10-CM | POA: Diagnosis not present

## 2020-03-23 DIAGNOSIS — D631 Anemia in chronic kidney disease: Secondary | ICD-10-CM | POA: Diagnosis not present

## 2020-03-23 DIAGNOSIS — D509 Iron deficiency anemia, unspecified: Secondary | ICD-10-CM | POA: Diagnosis not present

## 2020-03-25 ENCOUNTER — Ambulatory Visit: Payer: Medicare Other

## 2020-03-26 DIAGNOSIS — D508 Other iron deficiency anemias: Secondary | ICD-10-CM | POA: Diagnosis not present

## 2020-03-26 DIAGNOSIS — D509 Iron deficiency anemia, unspecified: Secondary | ICD-10-CM | POA: Diagnosis not present

## 2020-03-26 DIAGNOSIS — N2581 Secondary hyperparathyroidism of renal origin: Secondary | ICD-10-CM | POA: Diagnosis not present

## 2020-03-26 DIAGNOSIS — D689 Coagulation defect, unspecified: Secondary | ICD-10-CM | POA: Diagnosis not present

## 2020-03-26 DIAGNOSIS — D631 Anemia in chronic kidney disease: Secondary | ICD-10-CM | POA: Diagnosis not present

## 2020-03-26 DIAGNOSIS — N186 End stage renal disease: Secondary | ICD-10-CM | POA: Diagnosis not present

## 2020-03-26 DIAGNOSIS — Z992 Dependence on renal dialysis: Secondary | ICD-10-CM | POA: Diagnosis not present

## 2020-03-27 ENCOUNTER — Ambulatory Visit: Payer: Medicare Other

## 2020-03-27 ENCOUNTER — Other Ambulatory Visit: Payer: Self-pay

## 2020-03-27 DIAGNOSIS — M6281 Muscle weakness (generalized): Secondary | ICD-10-CM

## 2020-03-27 DIAGNOSIS — R2681 Unsteadiness on feet: Secondary | ICD-10-CM | POA: Diagnosis not present

## 2020-03-27 NOTE — Therapy (Signed)
[place] MAIN [facility] SERVICES [address] [place], [place], [address] Phone: [phone]   Fax:  [phone]  Physical Therapy Treatment  Patient Details  Name: Kimberly Frost MRN: [id number] Date of Birth: 09-02-1950 Referring Provider (PT): Elmarie Shiley   Encounter Date: 03/27/2020   PT End of Session - 03/27/20 0811    Visit Number 16    Number of Visits 17    Date for PT Re-Evaluation 04/24/20    Authorization Type UHC medicare    PT Start Time 0802    PT Stop Time 7035    PT Time Calculation (min) 42 min    Equipment Utilized During Treatment Gait belt    Activity Tolerance Patient tolerated treatment well;No increased pain;Patient limited by fatigue    Behavior During Therapy [facility] for tasks assessed/performed           Past Medical History:  Diagnosis Date  . Chronic kidney disease   . Edema    RLE  . Hepatitis    Hep C  . History of blood transfusion   . Hypertension   . Wears dentures     Past Surgical History:  Procedure Laterality Date  . A/V FISTULAGRAM Left 04/11/2019   Procedure: A/V FISTULAGRAM;  Surgeon: Katha Cabal, MD;  Location: [place] [facility];  Service: Cardiovascular;  Laterality: Left;  . A/V FISTULAGRAM Left 05/01/2019   Procedure: A/V FISTULAGRAM;  Surgeon: Algernon Huxley, MD;  Location: [place] [facility];  Service: Cardiovascular;  Laterality: Left;  . A/V FISTULAGRAM Left 01/15/2020   Procedure: A/V FISTULAGRAM;  Surgeon: Algernon Huxley, MD;  Location: [place] [facility];  Service: Cardiovascular;  Laterality: Left;  . AV FISTULA PLACEMENT Left 06/16/2018   Procedure: Left Arm ARTERIOVENOUS (AV) FISTULA CREATION;  Surgeon: Marty Heck, MD;  Location: [place];  Service: Vascular;  Laterality: Left;  Marland Kitchen MULTIPLE TOOTH EXTRACTIONS    . MYOMECTOMY      There were no vitals filed for this visit.   Subjective Assessment - 03/27/20 0806    Subjective The patient reports that she has been  dizzy from her new medication.    Pertinent History [name] has been having dyalisis over a year and was having some problems wiht passing out due to BP regulation and was using a RW . Now she is not having the passing out symptoms. She has good days and bad days at the hospital. She is trying to get a kidney and the nurse thought she needed to have PT to have her use a RW. She is currently not using a RW. If she feels weak after dyalisis she uses a scooter at the store if she needs it.    Currently in Pain? No/denies    Pain Score 0-No pain           Seated Pre-Treatment Session:   BP 104/87- patient does report feeling dizzy  Seated:   Seated hip add squeezes 2x15 reps Seated clamshells with GTB 2x15 reps GTB hamstring curl against PT resistance 2x15 each LE  Green ball between ankles: LAQ with adduction squeeze 12x GTB 4 way ankle 10x each direction    Seated midway through session BP 106/67 HR 101   All exercises were completed with 3# ankle weights:  Seated marches x 15 reps each LE; x 2 sets  Seated LAQ x 15 reps  With 3 second holds alternating each LE Alternating ER/IR 15x each LE Heel toe  raises 15x;                      PT Education - 03/27/20 0813    Education Details BP readings, exercises    Person(s) Educated Patient    Methods Explanation    Comprehension Verbalized understanding            PT Short Term Goals - 02/28/20 1123      PT SHORT TERM GOAL #1   Title Patient will be independent in home exercise program to improve strength/mobility for better functional independence with ADLs.    Time 4    Period Weeks    Status Partially Met    Target Date 03/27/20      PT SHORT TERM GOAL #2   Title Patient will increase BLE gross strength to 4+/5 as to improve functional strength for independent gait, increased standing tolerance and increased ADL ability.    Baseline 01/26: 4/5    Time 4    Period Weeks    Status Partially Met     Target Date 03/27/20      PT SHORT TERM GOAL #3   Title Patient will ascend/descend 4 stairs without rail assist independently without loss of balance to improve ability to get in/out of home.    Baseline 12/29: able to perform 4 stairs x3 with b/l rail today and no loss of balance    Time 8    Period Weeks    Status Achieved    Target Date 01/10/20             PT Long Term Goals - 02/28/20 0809      PT LONG TERM GOAL #1   Time 8    Period Weeks    Status Partially Met      PT LONG TERM GOAL #2   Title Patient will ascend/descend 4 stairs without rail assist independently without loss of balance to improve ability to get in/out of home.    Baseline see above    Time 8    Period Weeks    Status Achieved      PT LONG TERM GOAL #3   Title Patient will increase 10 meter walk test to >1.45ms as to improve gait speed for better community ambulation and to reduce fall risk.    Baseline 12/13/19= .855mec, 01/26: 1.36 m/sec    Time 8    Period Weeks    Status Achieved      PT LONG TERM GOAL #4   Title Patient (> 6022ears old) will complete five times sit to stand test in < 15 seconds indicating an increased LE strength and improved balance.    Baseline 01/26: 18s    Time 8    Period Weeks    Status New    Target Date 04/24/20      PT LONG TERM GOAL #5   Title Patient will increase ABC scale score >80% to demonstrate better functional mobility and better confidence with ADLs    Baseline 01/26: 76%    Time 8    Period Weeks    Status New    Target Date 04/24/20                 Plan - 03/27/20 [id number]  Clinical Impression Statement The patient was motiviated throughout session.  The patient's program continued to be in a sitting position due to low BP and patient complaints of dizziness.  Patient with muscle  weakness and quick to fatigue.  Patient continues to benefit from additional skilled PT services to improve LE strength and balance for improved quality of life.     Personal Factors and Comorbidities Comorbidity 1;Comorbidity 2;Comorbidity 3+    Comorbidities Patient has PMHx :Hyperparathyroidism, HTN Hep C: Anemia ESRD: She gets dialysis 3 x week. She follows with nephrology. She is currently waiting to be listed at [facility] for a kidney transplant.    Stability/Clinical Decision Making Stable/Uncomplicated    Rehab Potential Good    PT Frequency 1x / week    PT Duration 8 weeks    PT Treatment/Interventions Therapeutic exercise;Therapeutic activities    PT Next Visit Plan continue to progress strength and activity tolerance    PT Home Exercise Plan no updates this session    Consulted and Agree with Plan of Care Patient           Patient will benefit from skilled therapeutic intervention in order to improve the following deficits and impairments:  Abnormal gait,Decreased activity tolerance,Difficulty walking  Visit Diagnosis: Muscle weakness (generalized)  Gait instability     Problem List Patient Active Problem List   Diagnosis Date Noted  . A-V fistula ([place]) 04/26/2019  . ESRD (end stage renal disease) ([place]) 10/26/2018  . Secondary hyperparathyroidism of renal origin ([place]) 09/06/2018  . Anemia due to stage 5 chronic kidney disease ([place]) 02/26/2018  . Hepatitis C 02/15/2013  . Essential hypertension, benign 12/26/2008    Hal Morales PT, DPT 03/27/2020, 8:43 AM  [place] MAIN [facility] SERVICES [address] [place], [place], [address] Phone: [phone]   Fax:  [phone]  Name: PEARLINA FRIEDLY MRN: [id number] Date of Birth: 11-Nov-1950

## 2020-03-28 DIAGNOSIS — D631 Anemia in chronic kidney disease: Secondary | ICD-10-CM | POA: Diagnosis not present

## 2020-03-28 DIAGNOSIS — D508 Other iron deficiency anemias: Secondary | ICD-10-CM | POA: Diagnosis not present

## 2020-03-28 DIAGNOSIS — D689 Coagulation defect, unspecified: Secondary | ICD-10-CM | POA: Diagnosis not present

## 2020-03-28 DIAGNOSIS — N186 End stage renal disease: Secondary | ICD-10-CM | POA: Diagnosis not present

## 2020-03-28 DIAGNOSIS — N2581 Secondary hyperparathyroidism of renal origin: Secondary | ICD-10-CM | POA: Diagnosis not present

## 2020-03-28 DIAGNOSIS — Z20822 Contact with and (suspected) exposure to covid-19: Secondary | ICD-10-CM | POA: Diagnosis not present

## 2020-03-28 DIAGNOSIS — D509 Iron deficiency anemia, unspecified: Secondary | ICD-10-CM | POA: Diagnosis not present

## 2020-03-28 DIAGNOSIS — Z992 Dependence on renal dialysis: Secondary | ICD-10-CM | POA: Diagnosis not present

## 2020-03-28 DIAGNOSIS — Z1152 Encounter for screening for COVID-19: Secondary | ICD-10-CM | POA: Diagnosis not present

## 2020-03-28 NOTE — Telephone Encounter (Signed)
Front desk was notified to contact patient to schedule a follow up appointment with Dr. Bonna Gains. Traci stated that she would contact the patient.

## 2020-03-30 DIAGNOSIS — N186 End stage renal disease: Secondary | ICD-10-CM | POA: Diagnosis not present

## 2020-03-30 DIAGNOSIS — N2581 Secondary hyperparathyroidism of renal origin: Secondary | ICD-10-CM | POA: Diagnosis not present

## 2020-03-30 DIAGNOSIS — D508 Other iron deficiency anemias: Secondary | ICD-10-CM | POA: Diagnosis not present

## 2020-03-30 DIAGNOSIS — D509 Iron deficiency anemia, unspecified: Secondary | ICD-10-CM | POA: Diagnosis not present

## 2020-03-30 DIAGNOSIS — D631 Anemia in chronic kidney disease: Secondary | ICD-10-CM | POA: Diagnosis not present

## 2020-03-30 DIAGNOSIS — Z992 Dependence on renal dialysis: Secondary | ICD-10-CM | POA: Diagnosis not present

## 2020-03-30 DIAGNOSIS — D689 Coagulation defect, unspecified: Secondary | ICD-10-CM | POA: Diagnosis not present

## 2020-04-01 DIAGNOSIS — Z992 Dependence on renal dialysis: Secondary | ICD-10-CM | POA: Diagnosis not present

## 2020-04-01 DIAGNOSIS — N186 End stage renal disease: Secondary | ICD-10-CM | POA: Diagnosis not present

## 2020-04-01 DIAGNOSIS — I129 Hypertensive chronic kidney disease with stage 1 through stage 4 chronic kidney disease, or unspecified chronic kidney disease: Secondary | ICD-10-CM | POA: Diagnosis not present

## 2020-04-02 DIAGNOSIS — N2581 Secondary hyperparathyroidism of renal origin: Secondary | ICD-10-CM | POA: Diagnosis not present

## 2020-04-02 DIAGNOSIS — D508 Other iron deficiency anemias: Secondary | ICD-10-CM | POA: Diagnosis not present

## 2020-04-02 DIAGNOSIS — Z992 Dependence on renal dialysis: Secondary | ICD-10-CM | POA: Diagnosis not present

## 2020-04-02 DIAGNOSIS — D689 Coagulation defect, unspecified: Secondary | ICD-10-CM | POA: Diagnosis not present

## 2020-04-02 DIAGNOSIS — N186 End stage renal disease: Secondary | ICD-10-CM | POA: Diagnosis not present

## 2020-04-03 ENCOUNTER — Other Ambulatory Visit: Payer: Self-pay | Admitting: Internal Medicine

## 2020-04-03 ENCOUNTER — Ambulatory Visit: Payer: Medicare Other

## 2020-04-04 DIAGNOSIS — N2581 Secondary hyperparathyroidism of renal origin: Secondary | ICD-10-CM | POA: Diagnosis not present

## 2020-04-04 DIAGNOSIS — Z992 Dependence on renal dialysis: Secondary | ICD-10-CM | POA: Diagnosis not present

## 2020-04-04 DIAGNOSIS — D689 Coagulation defect, unspecified: Secondary | ICD-10-CM | POA: Diagnosis not present

## 2020-04-04 DIAGNOSIS — N186 End stage renal disease: Secondary | ICD-10-CM | POA: Diagnosis not present

## 2020-04-04 DIAGNOSIS — D508 Other iron deficiency anemias: Secondary | ICD-10-CM | POA: Diagnosis not present

## 2020-04-06 DIAGNOSIS — D508 Other iron deficiency anemias: Secondary | ICD-10-CM | POA: Diagnosis not present

## 2020-04-06 DIAGNOSIS — Z992 Dependence on renal dialysis: Secondary | ICD-10-CM | POA: Diagnosis not present

## 2020-04-06 DIAGNOSIS — N186 End stage renal disease: Secondary | ICD-10-CM | POA: Diagnosis not present

## 2020-04-06 DIAGNOSIS — D689 Coagulation defect, unspecified: Secondary | ICD-10-CM | POA: Diagnosis not present

## 2020-04-06 DIAGNOSIS — N2581 Secondary hyperparathyroidism of renal origin: Secondary | ICD-10-CM | POA: Diagnosis not present

## 2020-04-08 ENCOUNTER — Ambulatory Visit: Payer: Medicare Other

## 2020-04-09 DIAGNOSIS — Z992 Dependence on renal dialysis: Secondary | ICD-10-CM | POA: Diagnosis not present

## 2020-04-09 DIAGNOSIS — N2581 Secondary hyperparathyroidism of renal origin: Secondary | ICD-10-CM | POA: Diagnosis not present

## 2020-04-09 DIAGNOSIS — N186 End stage renal disease: Secondary | ICD-10-CM | POA: Diagnosis not present

## 2020-04-09 DIAGNOSIS — D689 Coagulation defect, unspecified: Secondary | ICD-10-CM | POA: Diagnosis not present

## 2020-04-10 ENCOUNTER — Other Ambulatory Visit: Payer: Self-pay

## 2020-04-10 ENCOUNTER — Ambulatory Visit: Payer: Medicare Other | Attending: Internal Medicine

## 2020-04-10 DIAGNOSIS — R2681 Unsteadiness on feet: Secondary | ICD-10-CM | POA: Diagnosis not present

## 2020-04-10 DIAGNOSIS — M6281 Muscle weakness (generalized): Secondary | ICD-10-CM | POA: Insufficient documentation

## 2020-04-10 NOTE — Therapy (Signed)
[place] MAIN [facility] SERVICES [address] [place], [place], [address] Phone: [phone]   Fax:  [phone]  Physical Therapy Treatment  Patient Details  Name: Kimberly Frost MRN: [id number] Date of Birth: 1950-08-30 Referring Provider (PT): Elmarie Shiley   Encounter Date: 04/10/2020   PT End of Session - 04/10/20 0810    Visit Number 17    Number of Visits 29    Date for PT Re-Evaluation 04/24/20    Authorization Type UHC medicare    PT Start Time 0800    PT Stop Time 0844    PT Time Calculation (min) 44 min    Equipment Utilized During Treatment Gait belt    Activity Tolerance Patient tolerated treatment well;No increased pain;Patient limited by fatigue    Behavior During Therapy [facility] for tasks assessed/performed           Past Medical History:  Diagnosis Date  . Chronic kidney disease   . Edema    RLE  . Hepatitis    Hep C  . History of blood transfusion   . Hypertension   . Wears dentures     Past Surgical History:  Procedure Laterality Date  . A/V FISTULAGRAM Left 04/11/2019   Procedure: A/V FISTULAGRAM;  Surgeon: Katha Cabal, MD;  Location: [place] [facility];  Service: Cardiovascular;  Laterality: Left;  . A/V FISTULAGRAM Left 05/01/2019   Procedure: A/V FISTULAGRAM;  Surgeon: Algernon Huxley, MD;  Location: [place] [facility];  Service: Cardiovascular;  Laterality: Left;  . A/V FISTULAGRAM Left 01/15/2020   Procedure: A/V FISTULAGRAM;  Surgeon: Algernon Huxley, MD;  Location: [place] [facility];  Service: Cardiovascular;  Laterality: Left;  . AV FISTULA PLACEMENT Left 06/16/2018   Procedure: Left Arm ARTERIOVENOUS (AV) FISTULA CREATION;  Surgeon: Marty Heck, MD;  Location: [place];  Service: Vascular;  Laterality: Left;  Marland Kitchen MULTIPLE TOOTH EXTRACTIONS    . MYOMECTOMY      There were no vitals filed for this visit.   Subjective Assessment - 04/10/20 0805    Subjective Patient reports her new medication has  been affecting her a lot. Missed last sessions due to this.    Pertinent History [name] has been having dyalisis over a year and was having some problems wiht passing out due to BP regulation and was using a RW . Now she is not having the passing out symptoms. She has good days and bad days at the hospital. She is trying to get a kidney and the nurse thought she needed to have PT to have her use a RW. She is currently not using a RW. If she feels weak after dyalisis she uses a scooter at the store if she needs it.    Currently in Pain? No/denies           Blood pressure: seated 106/66 standing 76/52      All exercises were completed with 3# ankle weights:  Seated marches x 12 reps each LE; x 2 sets  Seated LAQ x 12 reps  With 3 second holds  Heel toe raises 15x;    BP midsession: seated: 113/58 seated: 65/47   Seated:   Seated hip add squeezes x 3 second holds 10 reps Seated clamshells with GTB x 20x reps GTB hamstring curl against PT resistance 15x each LE  Green ball between ankles: LAQ with adduction squeeze 12x GTB 4 way ankle 10x each direction    BP end of  session 96/62   Ambulate from gym to upstairs area with PT assistance due to lowered blood pressure. Assisted into car for safe transfers, no LOB but patient reports dizziness. >1200 ft w/o AD     Pt educated throughout session about proper posture and technique with exercises. Improved exercise technique, movement at target joints, use of target muscles after min to mod verbal, visual, tactile cues.  Patient educated on need to monitor blood pressure at home. Agreed to inform doctor of large changes from seated to standing. Seated interventions required rest breaks due to elevated HR and patient feeling fatigued.  Patient will continue to benefit from skilled physical therapy to improve generalized strength, ROM, and capacity for functional activity.                           PT Education - 04/10/20  0809    Education Details BP reading, inform doctor, exercise technique,    Person(s) Educated Patient    Methods Explanation;Demonstration;Tactile cues;Verbal cues    Comprehension Verbalized understanding;Returned demonstration;Verbal cues required;Tactile cues required            PT Short Term Goals - 02/28/20 1123      PT SHORT TERM GOAL #1   Title Patient will be independent in home exercise program to improve strength/mobility for better functional independence with ADLs.    Time 4    Period Weeks    Status Partially Met    Target Date 03/27/20      PT SHORT TERM GOAL #2   Title Patient will increase BLE gross strength to 4+/5 as to improve functional strength for independent gait, increased standing tolerance and increased ADL ability.    Baseline 01/26: 4/5    Time 4    Period Weeks    Status Partially Met    Target Date 03/27/20      PT SHORT TERM GOAL #3   Title Patient will ascend/descend 4 stairs without rail assist independently without loss of balance to improve ability to get in/out of home.    Baseline 12/29: able to perform 4 stairs x3 with b/l rail today and no loss of balance    Time 8    Period Weeks    Status Achieved    Target Date 01/10/20             PT Long Term Goals - 02/28/20 0809      PT LONG TERM GOAL #1   Time 8    Period Weeks    Status Partially Met      PT LONG TERM GOAL #2   Title Patient will ascend/descend 4 stairs without rail assist independently without loss of balance to improve ability to get in/out of home.    Baseline see above    Time 8    Period Weeks    Status Achieved      PT LONG TERM GOAL #3   Title Patient will increase 10 meter walk test to >1.66ms as to improve gait speed for better community ambulation and to reduce fall risk.    Baseline 12/13/19= .847mec, 01/26: 1.36 m/sec    Time 8    Period Weeks    Status Achieved      PT LONG TERM GOAL #4   Title Patient (> 6077ears old) will complete five times  sit to stand test in < 15 seconds indicating an increased LE strength and improved balance.  Baseline 01/26: 18s    Time 8    Period Weeks    Status New    Target Date 04/24/20      PT LONG TERM GOAL #5   Title Patient will increase ABC scale score >80% to demonstrate better functional mobility and better confidence with ADLs    Baseline 01/26: 76%    Time 8    Period Weeks    Status New    Target Date 04/24/20                 Plan - 04/10/20 0846    Clinical Impression Statement Patient educated on need to monitor blood pressure at home. Agreed to inform doctor of large changes from seated to standing. Seated interventions required rest breaks due to elevated HR and patient feeling fatigued.  Patient will continue to benefit from skilled physical therapy to improve generalized strength, ROM, and capacity for functional activity.    Personal Factors and Comorbidities Comorbidity 1;Comorbidity 2;Comorbidity 3+    Comorbidities Patient has PMHx :Hyperparathyroidism, HTN Hep C: Anemia ESRD: She gets dialysis 3 x week. She follows with nephrology. She is currently waiting to be listed at [facility] for a kidney transplant.    Stability/Clinical Decision Making Stable/Uncomplicated    Rehab Potential Good    PT Frequency 1x / week    PT Duration 8 weeks    PT Treatment/Interventions Therapeutic exercise;Therapeutic activities    PT Next Visit Plan continue to progress strength and activity tolerance    PT Home Exercise Plan no updates this session    Consulted and Agree with Plan of Care Patient           Patient will benefit from skilled therapeutic intervention in order to improve the following deficits and impairments:  Abnormal gait,Decreased activity tolerance,Difficulty walking  Visit Diagnosis: Muscle weakness (generalized)  Gait instability     Problem List Patient Active Problem List   Diagnosis Date Noted  . A-V fistula ([place]) 04/26/2019  . ESRD (end  stage renal disease) ([place]) 10/26/2018  . Secondary hyperparathyroidism of renal origin ([place]) 09/06/2018  . Anemia due to stage 5 chronic kidney disease ([place]) 02/26/2018  . Hepatitis C 02/15/2013  . Essential hypertension, benign 12/26/2008   Janna Arch, PT, DPT   04/10/2020, 8:47 AM  [place] MAIN [facility] SERVICES [address] [place], [place], [address] Phone: [phone]   Fax:  [phone]  Name: IMAAN PADGETT MRN: [id number] Date of Birth: Aug 11, 1950

## 2020-04-11 DIAGNOSIS — D689 Coagulation defect, unspecified: Secondary | ICD-10-CM | POA: Diagnosis not present

## 2020-04-11 DIAGNOSIS — N2581 Secondary hyperparathyroidism of renal origin: Secondary | ICD-10-CM | POA: Diagnosis not present

## 2020-04-11 DIAGNOSIS — N186 End stage renal disease: Secondary | ICD-10-CM | POA: Diagnosis not present

## 2020-04-11 DIAGNOSIS — Z992 Dependence on renal dialysis: Secondary | ICD-10-CM | POA: Diagnosis not present

## 2020-04-12 ENCOUNTER — Other Ambulatory Visit (INDEPENDENT_AMBULATORY_CARE_PROVIDER_SITE_OTHER): Payer: Self-pay | Admitting: Vascular Surgery

## 2020-04-12 DIAGNOSIS — N186 End stage renal disease: Secondary | ICD-10-CM

## 2020-04-13 DIAGNOSIS — N2581 Secondary hyperparathyroidism of renal origin: Secondary | ICD-10-CM | POA: Diagnosis not present

## 2020-04-13 DIAGNOSIS — N186 End stage renal disease: Secondary | ICD-10-CM | POA: Diagnosis not present

## 2020-04-13 DIAGNOSIS — Z992 Dependence on renal dialysis: Secondary | ICD-10-CM | POA: Diagnosis not present

## 2020-04-13 DIAGNOSIS — D689 Coagulation defect, unspecified: Secondary | ICD-10-CM | POA: Diagnosis not present

## 2020-04-15 ENCOUNTER — Ambulatory Visit (INDEPENDENT_AMBULATORY_CARE_PROVIDER_SITE_OTHER): Payer: Medicare Other | Admitting: Nurse Practitioner

## 2020-04-15 ENCOUNTER — Other Ambulatory Visit: Payer: Self-pay

## 2020-04-15 ENCOUNTER — Ambulatory Visit (INDEPENDENT_AMBULATORY_CARE_PROVIDER_SITE_OTHER): Payer: Medicare Other

## 2020-04-15 ENCOUNTER — Telehealth: Payer: Self-pay

## 2020-04-15 ENCOUNTER — Ambulatory Visit: Payer: Medicare Other

## 2020-04-15 VITALS — BP 147/89 | HR 94 | Ht 66.0 in | Wt 136.0 lb

## 2020-04-15 DIAGNOSIS — N186 End stage renal disease: Secondary | ICD-10-CM | POA: Diagnosis not present

## 2020-04-15 DIAGNOSIS — I10 Essential (primary) hypertension: Secondary | ICD-10-CM | POA: Diagnosis not present

## 2020-04-15 DIAGNOSIS — M810 Age-related osteoporosis without current pathological fracture: Secondary | ICD-10-CM

## 2020-04-15 NOTE — Telephone Encounter (Addendum)
Benefit verification request submitted-pending decision Last injection on 12/21/2019 Next injection after 06/20/2020

## 2020-04-16 DIAGNOSIS — N186 End stage renal disease: Secondary | ICD-10-CM | POA: Diagnosis not present

## 2020-04-16 DIAGNOSIS — N2581 Secondary hyperparathyroidism of renal origin: Secondary | ICD-10-CM | POA: Diagnosis not present

## 2020-04-16 DIAGNOSIS — Z992 Dependence on renal dialysis: Secondary | ICD-10-CM | POA: Diagnosis not present

## 2020-04-16 DIAGNOSIS — D689 Coagulation defect, unspecified: Secondary | ICD-10-CM | POA: Diagnosis not present

## 2020-04-17 ENCOUNTER — Ambulatory Visit: Payer: Medicare Other

## 2020-04-17 ENCOUNTER — Telehealth (INDEPENDENT_AMBULATORY_CARE_PROVIDER_SITE_OTHER): Payer: Self-pay

## 2020-04-17 ENCOUNTER — Other Ambulatory Visit: Payer: Self-pay

## 2020-04-17 DIAGNOSIS — M6281 Muscle weakness (generalized): Secondary | ICD-10-CM

## 2020-04-17 DIAGNOSIS — R2681 Unsteadiness on feet: Secondary | ICD-10-CM

## 2020-04-17 NOTE — Telephone Encounter (Signed)
Spoke with the patient and she is scheduled with Dr. Lucky Cowboy for a LUE fistulagram on 04/22/20 with a 11:00 am arrival time to the MM. Covid testing on 04/18/20 between 8-2 pm a the [facility]. Pre-procedure instructions were discussed and will be mailed.

## 2020-04-17 NOTE — Therapy (Addendum)
[place] MAIN [facility] SERVICES [address] [place], [place], [address] Phone: [phone]   Fax:  [phone]  Physical Therapy Treatment  Patient Details  Name: Kimberly Frost MRN: [id number] Date of Birth: Jan 24, 1951 Referring Provider (PT): Elmarie Shiley   Encounter Date: 04/17/2020   PT End of Session - 04/17/20 0817    Visit Number 18    Number of Visits 29    Date for PT Re-Evaluation 04/24/20    Authorization Type UHC medicare    PT Start Time 0803    PT Stop Time 0841    PT Time Calculation (min) 38 min    Activity Tolerance Patient tolerated treatment well;No increased pain;Patient limited by fatigue    Behavior During Therapy [facility] for tasks assessed/performed           Past Medical History:  Diagnosis Date  . Chronic kidney disease   . Edema    RLE  . Hepatitis    Hep C  . History of blood transfusion   . Hypertension   . Wears dentures     Past Surgical History:  Procedure Laterality Date  . A/V FISTULAGRAM Left 04/11/2019   Procedure: A/V FISTULAGRAM;  Surgeon: Katha Cabal, MD;  Location: [place] [facility];  Service: Cardiovascular;  Laterality: Left;  . A/V FISTULAGRAM Left 05/01/2019   Procedure: A/V FISTULAGRAM;  Surgeon: Algernon Huxley, MD;  Location: [place] [facility];  Service: Cardiovascular;  Laterality: Left;  . A/V FISTULAGRAM Left 01/15/2020   Procedure: A/V FISTULAGRAM;  Surgeon: Algernon Huxley, MD;  Location: [place] [facility];  Service: Cardiovascular;  Laterality: Left;  . AV FISTULA PLACEMENT Left 06/16/2018   Procedure: Left Arm ARTERIOVENOUS (AV) FISTULA CREATION;  Surgeon: Marty Heck, MD;  Location: [place];  Service: Vascular;  Laterality: Left;  Marland Kitchen MULTIPLE TOOTH EXTRACTIONS    . MYOMECTOMY      There were no vitals filed for this visit.   Subjective Assessment - 04/17/20 0808    Subjective Pt doing well today, no pain at all. Was feeling 'crazy' after a new recent medication,  which has since been discontinued. Was feeling presyncopal again while on this med.    Pertinent History Patient has been having dialysis over a year and was having some problems with passing out due to BP regulation and was using a RW . Now she is not having the passing out symptoms. She has good days and bad days at the hospital. She is trying to get a kidney and the nurse thought she needed to have PT to have her use a RW. She is currently not using a RW. If she feels weak after dialysis she uses a scooter at the store if she needs it.           INTERVENTION THIS DATE:   Orthostatic BP monitoring (pt is chronically orthostatic)  Midodrine 30 minutes prior to HD.  128/73 mmHg seated  97/61 109HHR 0 minutes 101/61 117 bpm 1 minute  96/67 117bpm 2 minutes   -seated LAQ 2x12 c 3lb AW  -standing marching 2x12 c 3lb AW  -standing synchronous heel raises 2x20   -standing hip ABDCT 2x15 bilat, 3lb AW -standing hip extension 2x10 3lb AW   -STS from chair + airex pad 1x12 (pad added at rep 6 due to muscle fatigue)  -SLS 8x5secH alternating sides (16x total), supervision this date    PT Short Term Goals - 02/28/20 1123  PT SHORT TERM GOAL #1   Title Patient will be independent in home exercise program to improve strength/mobility for better functional independence with ADLs.    Time 4    Period Weeks    Status Partially Met    Target Date 03/27/20      PT SHORT TERM GOAL #2   Title Patient will increase BLE gross strength to 4+/5 as to improve functional strength for independent gait, increased standing tolerance and increased ADL ability.    Baseline 01/26: 4/5    Time 4    Period Weeks    Status Partially Met    Target Date 03/27/20      PT SHORT TERM GOAL #3   Title Patient will ascend/descend 4 stairs without rail assist independently without loss of balance to improve ability to get in/out of home.    Baseline 12/29: able to perform 4 stairs x3 with b/l rail today and no  loss of balance    Time 8    Period Weeks    Status Achieved    Target Date 01/10/20             PT Long Term Goals - 02/28/20 0809                       PT LONG TERM GOAL #2   Title Patient will ascend/descend 4 stairs without rail assist independently without loss of balance to improve ability to get in/out of home.    Baseline see above    Time 8    Period Weeks    Status Achieved      PT LONG TERM GOAL #3   Title Patient will increase 10 meter walk test to >1.39ms as to improve gait speed for better community ambulation and to reduce fall risk.    Baseline 12/13/19= .866mec, 01/26: 1.36 m/sec    Time 8    Period Weeks    Status Achieved      PT LONG TERM GOAL #4   Title Patient (> 6056ears old) will complete five times sit to stand test in < 15 seconds indicating an increased LE strength and improved balance.    Baseline 01/26: 18s    Time 8    Period Weeks    Status New    Target Date 04/24/20      PT LONG TERM GOAL #5   Title Patient will increase ABC scale score >80% to demonstrate better functional mobility and better confidence with ADLs    Baseline 01/26: 76%    Time 8    Period Weeks    Status New    Target Date 04/24/20                 Plan - 04/17/20 0818    Clinical Impression Statement Strength continues to improve overall. This session with less hypotensive presyncopal prodrome, however, pt takes seated recocery intervals often. Continued with current plan of care as laid out in evaluation and recent prior sessions. Pt remains motivated to advance progress toward goals. Rest breaks provided as needed, pt quick to ask when needed. Pt does require varying levels of assistance and cuing for completion of exercises for correct form and sometimes due to pain/weakness. Pt closely monitored throughout session for safe vitals response and to maximize patient safety during interventions. Pt continues to demonstrate progress toward goals AEB  progression of some interventions this date either in volume or intensity.   Personal Factors  and Comorbidities Comorbidity 1;Comorbidity 2;Comorbidity 3+    Comorbidities Patient has PMHx :Hyperparathyroidism, HTN Hep C: Anemia ESRD: She gets dialysis 3 x week. She follows with nephrology. She is currently waiting to be listed at [facility] for a kidney transplant.    Stability/Clinical Decision Making Stable/Uncomplicated    Clinical Decision Making Low    Rehab Potential Good    PT Frequency 1x / week    PT Duration 8 weeks    PT Treatment/Interventions Therapeutic exercise;Therapeutic activities    PT Next Visit Plan continue to progress strength and activity tolerance    PT Home Exercise Plan no updates this session    Consulted and Agree with Plan of Care Patient           Patient will benefit from skilled therapeutic intervention in order to improve the following deficits and impairments:  Abnormal gait,Decreased activity tolerance,Difficulty walking  Visit Diagnosis: Muscle weakness (generalized)  Gait instability     Problem List Patient Active Problem List   Diagnosis Date Noted  . A-V fistula ([place]) 04/26/2019  . ESRD (end stage renal disease) ([place]) 10/26/2018  . Secondary hyperparathyroidism of renal origin ([place]) 09/06/2018  . Anemia due to stage 5 chronic kidney disease ([place]) 02/26/2018  . Hepatitis C 02/15/2013  . Essential hypertension, benign 12/26/2008   8:42 AM, 04/17/20 Etta Grandchild, PT, DPT Physical Therapist - [facility]  Outpatient Physical Therapy- [place] [phone]     Etta Grandchild 04/17/2020, 8:20 AM  [place] MAIN [facility] SERVICES [address] [place], [place], [address] Phone: [phone]   Fax:  [phone]  Name: Kimberly Frost MRN: [id number] Date of Birth: May 29, 1950

## 2020-04-18 ENCOUNTER — Other Ambulatory Visit
Admission: RE | Admit: 2020-04-18 | Discharge: 2020-04-18 | Disposition: A | Discharge status: Home / Self Care | Payer: Medicare Other | Source: Ambulatory Visit | Attending: Vascular Surgery | Admitting: Vascular Surgery

## 2020-04-18 DIAGNOSIS — Z01812 Encounter for preprocedural laboratory examination: Secondary | ICD-10-CM | POA: Insufficient documentation

## 2020-04-18 DIAGNOSIS — D689 Coagulation defect, unspecified: Secondary | ICD-10-CM | POA: Diagnosis not present

## 2020-04-18 DIAGNOSIS — Z20822 Contact with and (suspected) exposure to covid-19: Secondary | ICD-10-CM | POA: Diagnosis not present

## 2020-04-18 DIAGNOSIS — N186 End stage renal disease: Secondary | ICD-10-CM | POA: Diagnosis not present

## 2020-04-18 DIAGNOSIS — Z992 Dependence on renal dialysis: Secondary | ICD-10-CM | POA: Diagnosis not present

## 2020-04-18 DIAGNOSIS — N2581 Secondary hyperparathyroidism of renal origin: Secondary | ICD-10-CM | POA: Diagnosis not present

## 2020-04-18 LAB — SARS CORONAVIRUS 2 (TAT 6-24 HRS): SARS Coronavirus 2: NEGATIVE

## 2020-04-20 DIAGNOSIS — N2581 Secondary hyperparathyroidism of renal origin: Secondary | ICD-10-CM | POA: Diagnosis not present

## 2020-04-20 DIAGNOSIS — N186 End stage renal disease: Secondary | ICD-10-CM | POA: Diagnosis not present

## 2020-04-20 DIAGNOSIS — Z992 Dependence on renal dialysis: Secondary | ICD-10-CM | POA: Diagnosis not present

## 2020-04-20 DIAGNOSIS — D689 Coagulation defect, unspecified: Secondary | ICD-10-CM | POA: Diagnosis not present

## 2020-04-21 ENCOUNTER — Encounter (INDEPENDENT_AMBULATORY_CARE_PROVIDER_SITE_OTHER): Payer: Self-pay | Admitting: Nurse Practitioner

## 2020-04-21 NOTE — H&P (View-Only) (Signed)
 Subjective:    Patient ID: Kimberly Frost, female    DOB: 08/04/1950, 70 y.o.   MRN: [id number] Chief Complaint  Patient presents with  . Follow-up    3 mo U/S    The patient returns to the office for followup status post intervention of the dialysis access left brachial cephalic. Following the intervention the access function has not significantly improved, with worse flow rates and improved KT/V. The patient has not been experiencing increased bleeding times following decannulation and the patient denies increased recirculation. The patient denies an increase in arm swelling. At the present time the patient denies hand pain.  The patient denies amaurosis fugax or recent TIA symptoms. There are no recent neurological changes noted. The patient denies claudication symptoms or rest pain symptoms. The patient denies history of DVT, PE or superficial thrombophlebitis. The patient denies recent episodes of angina or shortness of breath.   Today the patient has a flow volume of 815.  Previous study in June 2021 showed a flow volume of 20-32.  The distal upper arm also has elevated velocities.  The inflow velocities are also decreased compared to prior studies.       Review of Systems  All other systems reviewed and are negative.      Objective:   Physical Exam Vitals reviewed.  HENT:     Head: Normocephalic.  Cardiovascular:     Rate and Rhythm: Normal rate.     Pulses: Normal pulses.          Carotid pulses are 2+ on the left side.    Arteriovenous access: left arteriovenous access is present.    Comments: Good thrill and bruit Pulmonary:     Effort: Pulmonary effort is normal.  Neurological:     Mental Status: She is alert and oriented to person, place, and time.  Psychiatric:        Mood and Affect: Mood normal.        Behavior: Behavior normal.        Thought Content: Thought content normal.        Judgment: Judgment normal.     BP (!) 147/89   Pulse 94   Ht 5'  6" (1.676 m)   Wt 136 lb (61.7 kg)   BMI 21.95 kg/m   Past Medical History:  Diagnosis Date  . Chronic kidney disease   . Edema    RLE  . Hepatitis    Hep C  . History of blood transfusion   . Hypertension   . Wears dentures     Social History   Socioeconomic History  . Marital status: Single    Spouse name: Not on file  . Number of children: 0  . Years of education: Not on file  . Highest education level: Not on file  Occupational History  . 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   Tobacco Use  . Smoking status: Former Smoker    Types: Cigarettes  . Smokeless tobacco: Never Used  Vaping Use  . Vaping Use: Never used  Substance and Sexual Activity  . Alcohol use: Not Currently    Alcohol/week: 6.0 standard drinks    Types: 6 Cans of beer per week  . Drug use: Not Currently    Types: Marijuana    Comment: occasional   . Sexual activity: Not Currently  Other Topics Concern  . Not on file  Social History Narrative   Lives at home in private residence with fiance' Joe      Social Determinants of Health   Financial Resource Strain: Not on file  Food Insecurity: Not on file  Transportation Needs: Not on file  Physical Activity: Not on file  Stress: Not on file  Social Connections: Not on file  Intimate Partner Violence: Not on file    Past Surgical History:  Procedure Laterality Date  . A/V FISTULAGRAM Left 04/11/2019   Procedure: A/V FISTULAGRAM;  Surgeon: Schnier, Gregory G, MD;  Location: [facility];  Service: Cardiovascular;  Laterality: Left;  . A/V FISTULAGRAM Left 05/01/2019   Procedure: A/V FISTULAGRAM;  Surgeon: Dew, Jason S, MD;  Location: [facility];  Service: Cardiovascular;  Laterality: Left;  . A/V FISTULAGRAM Left 01/15/2020   Procedure: A/V FISTULAGRAM;  Surgeon: Dew, Jason S, MD;  Location: [facility];  Service: Cardiovascular;  Laterality: Left;  . AV FISTULA PLACEMENT Left 06/16/2018   Procedure: Left Arm ARTERIOVENOUS  (AV) FISTULA CREATION;  Surgeon: Clark, Christopher J, MD;  Location: [facility];  Service: Vascular;  Laterality: Left;  . MULTIPLE TOOTH EXTRACTIONS    . MYOMECTOMY      Family History  Problem Relation Age of Onset  . Diabetes Mother   . Hypertension Mother   . Breast cancer Mother 80  . Bone cancer Father   . Kidney cancer Maternal Grandmother   . Stroke Brother   . Diabetes Brother   . Breast cancer Maternal Aunt 50  . Breast cancer Maternal Aunt 70    No Known Allergies  CBC Latest Ref Rng & Units 07/31/2019 08/18/2018 08/04/2018  WBC 4.0 - 10.5 K/uL 8.3 - -  Hemoglobin 12.0 - 15.0 g/dL 10.3(L) 12.0 12.3  Hematocrit 36.0 - 46.0 % 30.9(L) - -  Platelets 150.0 - 400.0 K/uL 247.0 - -      CMP     Component Value Date/Time   NA 137 11/01/2019 0856   NA 138 06/23/2018 0000   K 4.3 11/01/2019 0856   K 4.9 06/23/2018 0000   CL 91 (L) 11/01/2019 0856   CO2 33 (H) 11/01/2019 0856   GLUCOSE 158 (H) 11/01/2019 0856   BUN 27 (H) 11/01/2019 0856   BUN 61 06/23/2018 0000   CREATININE 8.20 (HH) 11/01/2019 0856   CREATININE 7.13 06/23/2018 0000   CALCIUM 9.9 11/01/2019 0856   CALCIUM 8.8 06/23/2018 0000   PROT 8.0 11/01/2019 0856   ALBUMIN 4.2 11/01/2019 0856   AST 22 11/01/2019 0856   ALT 18 11/01/2019 0856   ALKPHOS 77 11/01/2019 0856   BILITOT 0.5 11/01/2019 0856   GFRNONAA 9 (L) 02/27/2018 0533   GFRAA 6 06/23/2018 0000     No results found.     Assessment & Plan:   1. ESRD (end stage renal disease) (HCC) Recommend:  The patient is experiencing increasing problems with their dialysis access.  Patient should have a fistulagram with the intention for intervention.  The intention for intervention is to restore appropriate flow and prevent thrombosis and possible loss of the access.  As well as improve the quality of dialysis therapy.  The risks, benefits and alternative therapies were reviewed in detail with the patient.  All questions were answered.  The patient  agrees to proceed with angio/intervention.      2. Essential hypertension, benign Continue antihypertensive medications as already ordered, these medications have been reviewed and there are no changes at this time.    Current Outpatient Medications on File Prior to Visit  Medication Sig Dispense Refill  .   acetaminophen (TYLENOL) 500 MG tablet Take 1,000 mg by mouth every 6 (six) hours as needed for moderate pain or headache.    Marland Kitchen atorvastatin (LIPITOR) 20 MG tablet TAKE 1 TABLET BY MOUTH EVERY DAY 90 tablet 0  . Cinacalcet HCl (SENSIPAR PO) Take by mouth.    . Methoxy PEG-Epoetin Beta (MIRCERA IJ) Mircera    . midodrine (PROAMATINE) 10 MG tablet Take by mouth.    . multivitamin (RENA-VIT) TABS tablet Take 1 tablet by mouth daily.    . Sodium Sulfate-Mag Sulfate-KCl (SUTAB) [id number] MG TABS At 5 PM take 12 tablets using the 8 oz cup provided in the kit drinking 5 cups of water and 5 hours before your procedure repeat the same process. 24 tablet 0  . VELPHORO 500 MG chewable tablet Chew 1,000 mg by mouth See admin instructions. Take 1000 mg with meals and 500 mg with snacks    . VITAMIN D, ERGOCALCIFEROL, PO Take by mouth.     No current facility-administered medications on file prior to visit.    There are no Patient Instructions on file for this visit. No follow-ups on file.   Kris Hartmann, NP

## 2020-04-21 NOTE — Progress Notes (Signed)
 Subjective:    Patient ID: Kimberly Frost, female    DOB: 05/18/1950, 70 y.o.   MRN: [id number] Chief Complaint  Patient presents with  . Follow-up    3 mo U/S    The patient returns to the office for followup status post intervention of the dialysis access left brachial cephalic. Following the intervention the access function has not significantly improved, with worse flow rates and improved KT/V. The patient has not been experiencing increased bleeding times following decannulation and the patient denies increased recirculation. The patient denies an increase in arm swelling. At the present time the patient denies hand pain.  The patient denies amaurosis fugax or recent TIA symptoms. There are no recent neurological changes noted. The patient denies claudication symptoms or rest pain symptoms. The patient denies history of DVT, PE or superficial thrombophlebitis. The patient denies recent episodes of angina or shortness of breath.   Today the patient has a flow volume of 815.  Previous study in June 2021 showed a flow volume of 20-32.  The distal upper arm also has elevated velocities.  The inflow velocities are also decreased compared to prior studies.       Review of Systems  All other systems reviewed and are negative.      Objective:   Physical Exam Vitals reviewed.  HENT:     Head: Normocephalic.  Cardiovascular:     Rate and Rhythm: Normal rate.     Pulses: Normal pulses.          Carotid pulses are 2+ on the left side.    Arteriovenous access: left arteriovenous access is present.    Comments: Good thrill and bruit Pulmonary:     Effort: Pulmonary effort is normal.  Neurological:     Mental Status: She is alert and oriented to person, place, and time.  Psychiatric:        Mood and Affect: Mood normal.        Behavior: Behavior normal.        Thought Content: Thought content normal.        Judgment: Judgment normal.     BP (!) 147/89   Pulse 94   Ht 5'  6" (1.676 m)   Wt 136 lb (61.7 kg)   BMI 21.95 kg/m   Past Medical History:  Diagnosis Date  . Chronic kidney disease   . Edema    RLE  . Hepatitis    Hep C  . History of blood transfusion   . Hypertension   . Wears dentures     Social History   Socioeconomic History  . Marital status: Single    Spouse name: Not on file  . Number of children: 0  . Years of education: Not on file  . Highest education level: Not on file  Occupational History  . 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   Tobacco Use  . Smoking status: Former Smoker    Types: Cigarettes  . Smokeless tobacco: Never Used  Vaping Use  . Vaping Use: Never used  Substance and Sexual Activity  . Alcohol use: Not Currently    Alcohol/week: 6.0 standard drinks    Types: 6 Cans of beer per week  . Drug use: Not Currently    Types: Marijuana    Comment: occasional   . Sexual activity: Not Currently  Other Topics Concern  . Not on file  Social History Narrative   Lives at home in private residence with fiance' Joe      Social Determinants of Health   Financial Resource Strain: Not on file  Food Insecurity: Not on file  Transportation Needs: Not on file  Physical Activity: Not on file  Stress: Not on file  Social Connections: Not on file  Intimate Partner Violence: Not on file    Past Surgical History:  Procedure Laterality Date  . A/V FISTULAGRAM Left 04/11/2019   Procedure: A/V FISTULAGRAM;  Surgeon: Schnier, Gregory G, MD;  Location: [facility]IVE [facility];  Service: Cardiovascular;  Laterality: Left;  . A/V FISTULAGRAM Left 05/01/2019   Procedure: A/V FISTULAGRAM;  Surgeon: Dew, Jason S, MD;  Location: [facility];  Service: Cardiovascular;  Laterality: Left;  . A/V FISTULAGRAM Left 01/15/2020   Procedure: A/V FISTULAGRAM;  Surgeon: Dew, Jason S, MD;  Location: [facility];  Service: Cardiovascular;  Laterality: Left;  . AV FISTULA PLACEMENT Left 06/16/2018   Procedure: Left Arm ARTERIOVENOUS  (AV) FISTULA CREATION;  Surgeon: Clark, Christopher J, MD;  Location: [facility] OR;  Service: Vascular;  Laterality: Left;  . MULTIPLE TOOTH EXTRACTIONS    . MYOMECTOMY      Family History  Problem Relation Age of Onset  . Diabetes Mother   . Hypertension Mother   . Breast cancer Mother 80  . Bone cancer Father   . Kidney cancer Maternal Grandmother   . Stroke Brother   . Diabetes Brother   . Breast cancer Maternal Aunt 50  . Breast cancer Maternal Aunt 70    No Known Allergies  CBC Latest Ref Rng & Units 07/31/2019 08/18/2018 08/04/2018  WBC 4.0 - 10.5 K/uL 8.3 - -  Hemoglobin 12.0 - 15.0 g/dL 10.3(L) 12.0 12.3  Hematocrit 36.0 - 46.0 % 30.9(L) - -  Platelets 150.0 - 400.0 K/uL 247.0 - -      CMP     Component Value Date/Time   NA 137 11/01/2019 0856   NA 138 06/23/2018 0000   K 4.3 11/01/2019 0856   K 4.9 06/23/2018 0000   CL 91 (L) 11/01/2019 0856   CO2 33 (H) 11/01/2019 0856   GLUCOSE 158 (H) 11/01/2019 0856   BUN 27 (H) 11/01/2019 0856   BUN 61 06/23/2018 0000   CREATININE 8.20 (HH) 11/01/2019 0856   CREATININE 7.13 06/23/2018 0000   CALCIUM 9.9 11/01/2019 0856   CALCIUM 8.8 06/23/2018 0000   PROT 8.0 11/01/2019 0856   ALBUMIN 4.2 11/01/2019 0856   AST 22 11/01/2019 0856   ALT 18 11/01/2019 0856   ALKPHOS 77 11/01/2019 0856   BILITOT 0.5 11/01/2019 0856   GFRNONAA 9 (L) 02/27/2018 0533   GFRAA 6 06/23/2018 0000     No results found.     Assessment & Plan:   1. ESRD (end stage renal disease) (HCC) Recommend:  The patient is experiencing increasing problems with their dialysis access.  Patient should have a fistulagram with the intention for intervention.  The intention for intervention is to restore appropriate flow and prevent thrombosis and possible loss of the access.  As well as improve the quality of dialysis therapy.  The risks, benefits and alternative therapies were reviewed in detail with the patient.  All questions were answered.  The patient  agrees to proceed with angio/intervention.      2. Essential hypertension, benign Continue antihypertensive medications as already ordered, these medications have been reviewed and there are no changes at this time.    Current Outpatient Medications on File Prior to Visit  Medication Sig Dispense Refill  .   acetaminophen (TYLENOL) 500 MG tablet Take 1,000 mg by mouth every 6 (six) hours as needed for moderate pain or headache.    Marland Kitchen atorvastatin (LIPITOR) 20 MG tablet TAKE 1 TABLET BY MOUTH EVERY DAY 90 tablet 0  . Cinacalcet HCl (SENSIPAR PO) Take by mouth.    . Methoxy PEG-Epoetin Beta (MIRCERA IJ) Mircera    . midodrine (PROAMATINE) 10 MG tablet Take by mouth.    . multivitamin (RENA-VIT) TABS tablet Take 1 tablet by mouth daily.    . Sodium Sulfate-Mag Sulfate-KCl (SUTAB) [phone] MG TABS At 5 PM take 12 tablets using the 8 oz cup provided in the kit drinking 5 cups of water and 5 hours before your procedure repeat the same process. 24 tablet 0  . VELPHORO 500 MG chewable tablet Chew 1,000 mg by mouth See admin instructions. Take 1000 mg with meals and 500 mg with snacks    . VITAMIN D, ERGOCALCIFEROL, PO Take by mouth.     No current facility-administered medications on file prior to visit.    There are no Patient Instructions on file for this visit. No follow-ups on file.   Kris Hartmann, NP

## 2020-04-22 ENCOUNTER — Ambulatory Visit
Admission: RE | Admit: 2020-04-22 | Discharge: 2020-04-22 | Disposition: A | Discharge status: Home / Self Care | Payer: Medicare Other | Attending: Vascular Surgery | Admitting: Vascular Surgery

## 2020-04-22 ENCOUNTER — Ambulatory Visit: Payer: Medicare Other

## 2020-04-22 ENCOUNTER — Encounter: Payer: Self-pay | Admitting: Vascular Surgery

## 2020-04-22 ENCOUNTER — Encounter
Admission: RE | Disposition: A | Discharge status: Home / Self Care | Payer: Self-pay | Source: Home / Self Care | Attending: Vascular Surgery

## 2020-04-22 ENCOUNTER — Other Ambulatory Visit: Payer: Self-pay

## 2020-04-22 DIAGNOSIS — T82898A Other specified complication of vascular prosthetic devices, implants and grafts, initial encounter: Secondary | ICD-10-CM

## 2020-04-22 DIAGNOSIS — Z833 Family history of diabetes mellitus: Secondary | ICD-10-CM | POA: Insufficient documentation

## 2020-04-22 DIAGNOSIS — I120 Hypertensive chronic kidney disease with stage 5 chronic kidney disease or end stage renal disease: Secondary | ICD-10-CM | POA: Diagnosis not present

## 2020-04-22 DIAGNOSIS — T82858A Stenosis of vascular prosthetic devices, implants and grafts, initial encounter: Secondary | ICD-10-CM | POA: Insufficient documentation

## 2020-04-22 DIAGNOSIS — Z95828 Presence of other vascular implants and grafts: Secondary | ICD-10-CM | POA: Insufficient documentation

## 2020-04-22 DIAGNOSIS — B1920 Unspecified viral hepatitis C without hepatic coma: Secondary | ICD-10-CM | POA: Insufficient documentation

## 2020-04-22 DIAGNOSIS — Z992 Dependence on renal dialysis: Secondary | ICD-10-CM

## 2020-04-22 DIAGNOSIS — Z8249 Family history of ischemic heart disease and other diseases of the circulatory system: Secondary | ICD-10-CM | POA: Insufficient documentation

## 2020-04-22 DIAGNOSIS — Y832 Surgical operation with anastomosis, bypass or graft as the cause of abnormal reaction of the patient, or of later complication, without mention of misadventure at the time of the procedure: Secondary | ICD-10-CM | POA: Diagnosis not present

## 2020-04-22 DIAGNOSIS — N186 End stage renal disease: Secondary | ICD-10-CM

## 2020-04-22 DIAGNOSIS — Z87891 Personal history of nicotine dependence: Secondary | ICD-10-CM | POA: Insufficient documentation

## 2020-04-22 HISTORY — PX: A/V FISTULAGRAM: CATH118298

## 2020-04-22 LAB — POTASSIUM (ARMC VASCULAR LAB ONLY): Potassium (ARMC vascular lab): 4.4 (ref 3.5–5.1)

## 2020-04-22 SURGERY — A/V FISTULAGRAM
Anesthesia: Moderate Sedation | Site: Arm Upper | Laterality: Left

## 2020-04-22 MED ORDER — MIDAZOLAM HCL 5 MG/5ML IJ SOLN
INTRAMUSCULAR | Status: AC
  Filled 2020-04-22: qty 5

## 2020-04-22 MED ORDER — FAMOTIDINE 20 MG PO TABS: 40.0000 mg | ORAL_TABLET | Freq: Once | ORAL | Status: DC | PRN

## 2020-04-22 MED ORDER — MIDAZOLAM HCL 2 MG/ML PO SYRP: 8.0000 mg | ORAL_SOLUTION | Freq: Once | ORAL | Status: DC | PRN

## 2020-04-22 MED ORDER — CEFAZOLIN SODIUM-DEXTROSE 1-4 GM/50ML-% IV SOLN
INTRAVENOUS | Status: AC
  Filled 2020-04-22: qty 50

## 2020-04-22 MED ORDER — METHYLPREDNISOLONE SODIUM SUCC 125 MG IJ SOLR: 125.0000 mg | Freq: Once | INTRAMUSCULAR | Status: DC | PRN

## 2020-04-22 MED ORDER — HEPARIN SODIUM (PORCINE) 1000 UNIT/ML IJ SOLN
INTRAMUSCULAR | Status: DC | PRN
  Administered 2020-04-22: 3000 [IU] via INTRAVENOUS

## 2020-04-22 MED ORDER — DIPHENHYDRAMINE HCL 50 MG/ML IJ SOLN: 50.0000 mg | Freq: Once | INTRAMUSCULAR | Status: DC | PRN

## 2020-04-22 MED ORDER — HEPARIN SODIUM (PORCINE) 1000 UNIT/ML IJ SOLN
INTRAMUSCULAR | Status: AC
  Filled 2020-04-22: qty 1

## 2020-04-22 MED ORDER — ONDANSETRON HCL 4 MG/2ML IJ SOLN: 4.0000 mg | Freq: Four times a day (QID) | INTRAMUSCULAR | Status: DC | PRN

## 2020-04-22 MED ORDER — FENTANYL CITRATE (PF) 100 MCG/2ML IJ SOLN
INTRAMUSCULAR | Status: DC | PRN
  Administered 2020-04-22: 50 ug via INTRAVENOUS

## 2020-04-22 MED ORDER — HYDROMORPHONE HCL 1 MG/ML IJ SOLN: 1.0000 mg | Freq: Once | INTRAMUSCULAR | Status: DC | PRN

## 2020-04-22 MED ORDER — SODIUM CHLORIDE 0.9 % IV SOLN: INTRAVENOUS | Status: DC

## 2020-04-22 MED ORDER — MIDAZOLAM HCL 2 MG/2ML IJ SOLN
INTRAMUSCULAR | Status: DC | PRN
  Administered 2020-04-22: 2 mg via INTRAVENOUS

## 2020-04-22 MED ORDER — IODIXANOL 320 MG/ML IV SOLN
INTRAVENOUS | Status: DC | PRN
  Administered 2020-04-22: 15 mL

## 2020-04-22 MED ORDER — SODIUM CHLORIDE 0.9 % IV SOLN
1.0000 g | Freq: Once | INTRAVENOUS | Status: AC
  Administered 2020-04-22: 1 g via INTRAVENOUS
  Filled 2020-04-22: qty 10

## 2020-04-22 MED ORDER — FENTANYL CITRATE (PF) 100 MCG/2ML IJ SOLN
INTRAMUSCULAR | Status: AC
  Filled 2020-04-22: qty 2

## 2020-04-22 SURGICAL SUPPLY — 7 items
CANNULA 5F STIFF (CANNULA) ×2 IMPLANT
COVER EZ STRL 42X30 (DRAPES) ×2 IMPLANT
COVER PROBE U/S 5X48 (MISCELLANEOUS) ×2 IMPLANT
DRAPE BRACHIAL (DRAPES) ×2 IMPLANT
PACK ANGIOGRAPHY (CUSTOM PROCEDURE TRAY) ×2 IMPLANT
SHEATH BRITE TIP 6FRX5.5 (SHEATH) ×2 IMPLANT
SUT MNCRL AB 4-0 PS2 18 (SUTURE) ×2 IMPLANT

## 2020-04-22 NOTE — Telephone Encounter (Signed)
Benefit verification received. No PA. OOP cost is $285. Please schedule patient for lab and NV appointment.

## 2020-04-22 NOTE — Interval H&P Note (Signed)
History and Physical Interval Note:  04/22/2020 10:55 AM  Kimberly Frost  has presented today for surgery, with the diagnosis of LT Upper Extremity Fistulagram   ESRD   Pt to have Covid test on 3-17.  The various methods of treatment have been discussed with the patient and family. After consideration of risks, benefits and other options for treatment, the patient has consented to  Procedure(s): A/V FISTULAGRAM (Left) as a surgical intervention.  The patient's history has been reviewed, patient examined, no change in status, stable for surgery.  I have reviewed the patient's chart and labs.  Questions were answered to the patient's satisfaction.     Leotis Pain

## 2020-04-22 NOTE — Telephone Encounter (Signed)
Spoke with patient and informed her of OOP cost of $285. Patient uncomfortable with this price. Instructed patient she could call her pharmacy to see how much the medication would cost with them. Patient verbalized understanding and stated that she would call us back and let us know.

## 2020-04-22 NOTE — Op Note (Signed)
[facility]    OPERATIVE NOTE   PROCEDURE: 1.   Left brachiocephalic arteriovenous fistula cannulation under ultrasound guidance 2.   Left arm fistulagram including central venogram   PRE-OPERATIVE DIAGNOSIS: 1. ESRD 2. Poorly functional left AVF  POST-OPERATIVE DIAGNOSIS: same as above   SURGEON: Leotis Pain, MD  ANESTHESIA: local with MCS  ESTIMATED BLOOD LOSS: 3 cc  FINDING(S): 1. Some narrowing of the cephalic vein just beyond the anastomosis but did not appear high-grade and was fairly diffuse over 2 to 3 cm.  The remainder of the cephalic vein including the previously placed stent in the mid upper arm cephalic vein were widely patent.  The cephalic vein subclavian vein confluence had very mild disease in the 20 to 30% range.  The central venous circulation was widely patent.  SPECIMEN(S):  None  CONTRAST: 15 cc  FLUORO TIME: 0.3 minutes  MODERATE CONSCIOUS SEDATION TIME: Approximately 12 minutes with 2 mg of Versed and 50 mcg of Fentanyl   INDICATIONS: Kimberly Frost is a 70 y.o. female who presents with malfunctioning left brachiocephalic arteriovenous fistula.  The patient is scheduled for left arm fistulagram.  The patient is aware the risks include but are not limited to: bleeding, infection, thrombosis of the cannulated access, and possible anaphylactic reaction to the contrast.  The patient is aware of the risks of the procedure and elects to proceed forward.  DESCRIPTION: After full informed written consent was obtained, the patient was brought back to the angiography suite and placed supine upon the angiography table.  The patient was connected to monitoring equipment. Moderate conscious sedation was administered with a face to face encounter with the patient throughout the procedure with my supervision of the RN administering medicines and monitoring the patient's vital signs and mental status throughout from the start of the procedure until  the patient was taken to the recovery room. The left arm was prepped and draped in the standard fashion for a percutaneous access intervention.  Under ultrasound guidance, the left brachiocephalic arteriovenous fistula was cannulated with a micropuncture needle under direct ultrasound guidance where it was patent and a permanent image was performed.  The microwire was advanced into the fistula and the needle was exchanged for the a microsheath.  I then upsized to a 6 Fr Sheath and imaging was performed.  Hand injections were completed to image the access including the central venous system. This demonstrated some narrowing of the cephalic vein just beyond the anastomosis but did not appear high-grade and was fairly diffuse over 2 to 3 cm.  The remainder of the cephalic vein including the previously placed stent in the mid upper arm cephalic vein were widely patent.  The cephalic vein subclavian vein confluence had very mild disease in the 20 to 30% range.  The central venous circulation was widely patent.  Based on the imaging, no further intervention is necessary.  The wire was removed from the sheath.  A 4-0 Monocryl purse-string suture was sewn around the sheath.  The sheath was removed while tying down the suture.  A sterile bandage was applied to the puncture site.  COMPLICATIONS: None  CONDITION: Stable   Leotis Pain  04/22/2020 1:54 PM   This note was created with Dragon Medical transcription system. Any errors in dictation are purely unintentional.

## 2020-04-24 ENCOUNTER — Ambulatory Visit: Payer: Medicare Other

## 2020-04-25 DIAGNOSIS — N186 End stage renal disease: Secondary | ICD-10-CM | POA: Diagnosis not present

## 2020-04-25 DIAGNOSIS — N2581 Secondary hyperparathyroidism of renal origin: Secondary | ICD-10-CM | POA: Diagnosis not present

## 2020-04-25 DIAGNOSIS — D689 Coagulation defect, unspecified: Secondary | ICD-10-CM | POA: Diagnosis not present

## 2020-04-25 DIAGNOSIS — Z992 Dependence on renal dialysis: Secondary | ICD-10-CM | POA: Diagnosis not present

## 2020-04-27 DIAGNOSIS — N2581 Secondary hyperparathyroidism of renal origin: Secondary | ICD-10-CM | POA: Diagnosis not present

## 2020-04-27 DIAGNOSIS — Z992 Dependence on renal dialysis: Secondary | ICD-10-CM | POA: Diagnosis not present

## 2020-04-27 DIAGNOSIS — N186 End stage renal disease: Secondary | ICD-10-CM | POA: Diagnosis not present

## 2020-04-27 DIAGNOSIS — D689 Coagulation defect, unspecified: Secondary | ICD-10-CM | POA: Diagnosis not present

## 2020-04-29 ENCOUNTER — Ambulatory Visit: Payer: Medicare Other

## 2020-04-30 DIAGNOSIS — D689 Coagulation defect, unspecified: Secondary | ICD-10-CM | POA: Diagnosis not present

## 2020-04-30 DIAGNOSIS — N186 End stage renal disease: Secondary | ICD-10-CM | POA: Diagnosis not present

## 2020-04-30 DIAGNOSIS — N2581 Secondary hyperparathyroidism of renal origin: Secondary | ICD-10-CM | POA: Diagnosis not present

## 2020-04-30 DIAGNOSIS — Z992 Dependence on renal dialysis: Secondary | ICD-10-CM | POA: Diagnosis not present

## 2020-05-01 ENCOUNTER — Ambulatory Visit: Payer: Medicare Other

## 2020-05-02 DIAGNOSIS — D689 Coagulation defect, unspecified: Secondary | ICD-10-CM | POA: Diagnosis not present

## 2020-05-02 DIAGNOSIS — Z992 Dependence on renal dialysis: Secondary | ICD-10-CM | POA: Diagnosis not present

## 2020-05-02 DIAGNOSIS — N2581 Secondary hyperparathyroidism of renal origin: Secondary | ICD-10-CM | POA: Diagnosis not present

## 2020-05-02 DIAGNOSIS — N186 End stage renal disease: Secondary | ICD-10-CM | POA: Diagnosis not present

## 2020-05-04 DIAGNOSIS — N186 End stage renal disease: Secondary | ICD-10-CM | POA: Diagnosis not present

## 2020-05-04 DIAGNOSIS — N2581 Secondary hyperparathyroidism of renal origin: Secondary | ICD-10-CM | POA: Diagnosis not present

## 2020-05-04 DIAGNOSIS — D689 Coagulation defect, unspecified: Secondary | ICD-10-CM | POA: Diagnosis not present

## 2020-05-04 DIAGNOSIS — Z992 Dependence on renal dialysis: Secondary | ICD-10-CM | POA: Diagnosis not present

## 2020-05-07 DIAGNOSIS — N186 End stage renal disease: Secondary | ICD-10-CM | POA: Diagnosis not present

## 2020-05-07 DIAGNOSIS — Z992 Dependence on renal dialysis: Secondary | ICD-10-CM | POA: Diagnosis not present

## 2020-05-07 DIAGNOSIS — D689 Coagulation defect, unspecified: Secondary | ICD-10-CM | POA: Diagnosis not present

## 2020-05-07 DIAGNOSIS — N2581 Secondary hyperparathyroidism of renal origin: Secondary | ICD-10-CM | POA: Diagnosis not present

## 2020-05-09 DIAGNOSIS — Z992 Dependence on renal dialysis: Secondary | ICD-10-CM | POA: Diagnosis not present

## 2020-05-09 DIAGNOSIS — N186 End stage renal disease: Secondary | ICD-10-CM | POA: Diagnosis not present

## 2020-05-09 DIAGNOSIS — N2581 Secondary hyperparathyroidism of renal origin: Secondary | ICD-10-CM | POA: Diagnosis not present

## 2020-05-09 DIAGNOSIS — D689 Coagulation defect, unspecified: Secondary | ICD-10-CM | POA: Diagnosis not present

## 2020-05-11 DIAGNOSIS — Z992 Dependence on renal dialysis: Secondary | ICD-10-CM | POA: Diagnosis not present

## 2020-05-11 DIAGNOSIS — N2581 Secondary hyperparathyroidism of renal origin: Secondary | ICD-10-CM | POA: Diagnosis not present

## 2020-05-11 DIAGNOSIS — N186 End stage renal disease: Secondary | ICD-10-CM | POA: Diagnosis not present

## 2020-05-11 DIAGNOSIS — D689 Coagulation defect, unspecified: Secondary | ICD-10-CM | POA: Diagnosis not present

## 2020-05-14 DIAGNOSIS — D689 Coagulation defect, unspecified: Secondary | ICD-10-CM | POA: Diagnosis not present

## 2020-05-14 DIAGNOSIS — N186 End stage renal disease: Secondary | ICD-10-CM | POA: Diagnosis not present

## 2020-05-14 DIAGNOSIS — N2581 Secondary hyperparathyroidism of renal origin: Secondary | ICD-10-CM | POA: Diagnosis not present

## 2020-05-14 DIAGNOSIS — D509 Iron deficiency anemia, unspecified: Secondary | ICD-10-CM | POA: Diagnosis not present

## 2020-05-14 DIAGNOSIS — Z992 Dependence on renal dialysis: Secondary | ICD-10-CM | POA: Diagnosis not present

## 2020-05-15 NOTE — Telephone Encounter (Signed)
Spoke with patient. Patient has not had a chance to look into this. She has had a recent surgery for dialysis on her arm and has been feeling bad. She will try to call today and will call her insurance, she stated they have a program to assist patients with high cost medication. I advised patient I would follow up with her next week and wished her a fast recovery.

## 2020-05-16 DIAGNOSIS — N2581 Secondary hyperparathyroidism of renal origin: Secondary | ICD-10-CM | POA: Diagnosis not present

## 2020-05-16 DIAGNOSIS — N186 End stage renal disease: Secondary | ICD-10-CM | POA: Diagnosis not present

## 2020-05-16 DIAGNOSIS — D509 Iron deficiency anemia, unspecified: Secondary | ICD-10-CM | POA: Diagnosis not present

## 2020-05-16 DIAGNOSIS — D689 Coagulation defect, unspecified: Secondary | ICD-10-CM | POA: Diagnosis not present

## 2020-05-16 DIAGNOSIS — Z992 Dependence on renal dialysis: Secondary | ICD-10-CM | POA: Diagnosis not present

## 2020-05-18 DIAGNOSIS — N2581 Secondary hyperparathyroidism of renal origin: Secondary | ICD-10-CM | POA: Diagnosis not present

## 2020-05-18 DIAGNOSIS — N186 End stage renal disease: Secondary | ICD-10-CM | POA: Diagnosis not present

## 2020-05-18 DIAGNOSIS — D689 Coagulation defect, unspecified: Secondary | ICD-10-CM | POA: Diagnosis not present

## 2020-05-18 DIAGNOSIS — D509 Iron deficiency anemia, unspecified: Secondary | ICD-10-CM | POA: Diagnosis not present

## 2020-05-18 DIAGNOSIS — Z992 Dependence on renal dialysis: Secondary | ICD-10-CM | POA: Diagnosis not present

## 2020-05-21 DIAGNOSIS — N186 End stage renal disease: Secondary | ICD-10-CM | POA: Diagnosis not present

## 2020-05-21 DIAGNOSIS — D689 Coagulation defect, unspecified: Secondary | ICD-10-CM | POA: Diagnosis not present

## 2020-05-21 DIAGNOSIS — N2581 Secondary hyperparathyroidism of renal origin: Secondary | ICD-10-CM | POA: Diagnosis not present

## 2020-05-21 DIAGNOSIS — D509 Iron deficiency anemia, unspecified: Secondary | ICD-10-CM | POA: Diagnosis not present

## 2020-05-21 DIAGNOSIS — Z992 Dependence on renal dialysis: Secondary | ICD-10-CM | POA: Diagnosis not present

## 2020-05-22 ENCOUNTER — Ambulatory Visit: Payer: Medicare Other | Admitting: Gastroenterology

## 2020-05-22 ENCOUNTER — Other Ambulatory Visit: Payer: Self-pay

## 2020-05-22 NOTE — Telephone Encounter (Signed)
Pt called and stated she called 3 assistance programs and they do not have anything at this time she will call back with update

## 2020-05-23 DIAGNOSIS — D509 Iron deficiency anemia, unspecified: Secondary | ICD-10-CM | POA: Diagnosis not present

## 2020-05-23 DIAGNOSIS — N2581 Secondary hyperparathyroidism of renal origin: Secondary | ICD-10-CM | POA: Diagnosis not present

## 2020-05-23 DIAGNOSIS — D689 Coagulation defect, unspecified: Secondary | ICD-10-CM | POA: Diagnosis not present

## 2020-05-23 DIAGNOSIS — N186 End stage renal disease: Secondary | ICD-10-CM | POA: Diagnosis not present

## 2020-05-23 DIAGNOSIS — Z992 Dependence on renal dialysis: Secondary | ICD-10-CM | POA: Diagnosis not present

## 2020-05-25 DIAGNOSIS — N186 End stage renal disease: Secondary | ICD-10-CM | POA: Diagnosis not present

## 2020-05-25 DIAGNOSIS — Z992 Dependence on renal dialysis: Secondary | ICD-10-CM | POA: Diagnosis not present

## 2020-05-25 DIAGNOSIS — N2581 Secondary hyperparathyroidism of renal origin: Secondary | ICD-10-CM | POA: Diagnosis not present

## 2020-05-25 DIAGNOSIS — D689 Coagulation defect, unspecified: Secondary | ICD-10-CM | POA: Diagnosis not present

## 2020-05-25 DIAGNOSIS — D509 Iron deficiency anemia, unspecified: Secondary | ICD-10-CM | POA: Diagnosis not present

## 2020-05-28 DIAGNOSIS — D689 Coagulation defect, unspecified: Secondary | ICD-10-CM | POA: Diagnosis not present

## 2020-05-28 DIAGNOSIS — D509 Iron deficiency anemia, unspecified: Secondary | ICD-10-CM | POA: Diagnosis not present

## 2020-05-28 DIAGNOSIS — N186 End stage renal disease: Secondary | ICD-10-CM | POA: Diagnosis not present

## 2020-05-28 DIAGNOSIS — N2581 Secondary hyperparathyroidism of renal origin: Secondary | ICD-10-CM | POA: Diagnosis not present

## 2020-05-28 DIAGNOSIS — Z992 Dependence on renal dialysis: Secondary | ICD-10-CM | POA: Diagnosis not present

## 2020-05-29 ENCOUNTER — Ambulatory Visit: Payer: Medicare Other | Attending: Internal Medicine

## 2020-05-29 ENCOUNTER — Other Ambulatory Visit: Payer: Self-pay

## 2020-05-29 DIAGNOSIS — R2681 Unsteadiness on feet: Secondary | ICD-10-CM | POA: Diagnosis not present

## 2020-05-29 DIAGNOSIS — M6281 Muscle weakness (generalized): Secondary | ICD-10-CM | POA: Diagnosis not present

## 2020-05-29 NOTE — Therapy (Signed)
[place] MAIN [facility] SERVICES [address] [place], [place], [address] Phone: [phone]   Fax:  [phone]  Physical Therapy Treatment/RECERT  Patient Details  Name: Kimberly Frost MRN: [id number] Date of Birth: 10-Feb-1950 Referring Provider (PT): Elmarie Shiley   Encounter Date: 05/29/2020   PT End of Session - 05/29/20 0820    Visit Number 19    Number of Visits 27    Date for PT Re-Evaluation 07/24/20    Authorization Type [employer] medicare    PT Start Time 0759    PT Stop Time 0843    PT Time Calculation (min) 44 min    Activity Tolerance Patient tolerated treatment well;Patient limited by fatigue    Behavior During Therapy [facility] for tasks assessed/performed           Past Medical History:  Diagnosis Date  . Chronic kidney disease   . Edema    RLE  . Hepatitis    Hep C  . History of blood transfusion   . Hypertension   . Wears dentures     Past Surgical History:  Procedure Laterality Date  . A/V FISTULAGRAM Left 04/11/2019   Procedure: A/V FISTULAGRAM;  Surgeon: Katha Cabal, MD;  Location: [place] [facility];  Service: Cardiovascular;  Laterality: Left;  . A/V FISTULAGRAM Left 05/01/2019   Procedure: A/V FISTULAGRAM;  Surgeon: Algernon Huxley, MD;  Location: [place] [facility];  Service: Cardiovascular;  Laterality: Left;  . A/V FISTULAGRAM Left 01/15/2020   Procedure: A/V FISTULAGRAM;  Surgeon: Algernon Huxley, MD;  Location: [place] [facility];  Service: Cardiovascular;  Laterality: Left;  . A/V FISTULAGRAM Left 04/22/2020   Procedure: A/V FISTULAGRAM;  Surgeon: Algernon Huxley, MD;  Location: [place] [facility];  Service: Cardiovascular;  Laterality: Left;  . AV FISTULA PLACEMENT Left 06/16/2018   Procedure: Left Arm ARTERIOVENOUS (AV) FISTULA CREATION;  Surgeon: Marty Heck, MD;  Location: [place];  Service: Vascular;  Laterality: Left;  Marland Kitchen MULTIPLE TOOTH EXTRACTIONS    . MYOMECTOMY      There were no vitals filed  for this visit.   Subjective Assessment - 05/29/20 0801    Subjective Patient has not been seen since 04/17/20. She underwent a minor procedure for her fistula on 04/22/20. Reports her medication has made her nauseous and weak feeling.    Pertinent History Patient has been having dialysis over a year and was having some problems with passing out due to BP regulation and was using a RW . Now she is not having the passing out symptoms. She has good days and bad days at the hospital. She is trying to get a kidney and the nurse thought she needed to have PT to have her use a RW. She is currently not using a RW. If she feels weak after dialysis she uses a scooter at the store if she needs it.    Currently in Pain? No/denies                Patient has not been seen since 04/17/20. She underwent a minor procedure for her fistula on 04/22/20. Reports her medication has made her nauseous and weak feeling.    Goals: HEP : not compliant on days shes as nauseous  MMT  Right Left  Hip flexion 4 4  Hip Abduction 4+ 4  Hip Adduction 4 4-  Knee Extension  4- 4-  Knee Flexion 4- 4-  DF 4 4  PF 4 4     10  MWT: 7 seconds fast; 10.9 seconds average speed  5x STS: 13.81 seconds no UE support  ABC: 55%  FOTO: 61%   new goal: 6 min walk test : 960 ft with one seated rest break   BP 99/69   Treatment:  Standing with CGA next to support surface:  Airex pad: static stand 30 seconds x 2 trials, noticeable trembling of ankles/LE's with fatigue and challenge to maintain stability-terminated due patient being dizzy.  BP: 100/62   Seated:  RTB hamstring curl 10x each LE        Patient returning to therapy after multiple weeks absence due to medical issues. Her goals were performed with additional goal of 6 minute walk test added to address patient's capacity for functional mobility. Patient very fatigued after goal performance, upon attempting to perform standing treatments she had cold chills  and dizziness requiring her to sit back down. Blood pressure and vitals retested and in low but functional range. Patient will benefit from continued skilled physical therapy to increase strength, stability, and mobility for return to PLOF and improved QOL.               PT Education - 05/29/20 0752    Education Details goals, POC    Person(s) Educated Patient    Methods Explanation;Demonstration;Tactile cues;Verbal cues    Comprehension Verbalized understanding;Returned demonstration;Verbal cues required;Tactile cues required            PT Short Term Goals - 05/29/20 4827      PT SHORT TERM GOAL #1   Title Patient will be independent in home exercise program to improve strength/mobility for better functional independence with ADLs.    Baseline 4/27: unable to when she was having medication issues    Time 4    Period Weeks    Status Partially Met    Target Date 06/26/20      PT SHORT TERM GOAL #2   Title Patient will increase BLE gross strength to 4+/5 as to improve functional strength for independent gait, increased standing tolerance and increased ADL ability.    Baseline 01/26: 4/5 4/27: see note    Time 4    Period Weeks    Status Partially Met    Target Date 06/26/20      PT SHORT TERM GOAL #3   Title Patient will ascend/descend 4 stairs without rail assist independently without loss of balance to improve ability to get in/out of home.    Baseline 12/29: able to perform 4 stairs x3 with b/l rail today and no loss of balance    Time 8    Period Weeks    Status Achieved    Target Date 01/10/20             PT Long Term Goals - 05/29/20 0822      PT LONG TERM GOAL #1   Title Patient will increase six minute walk test distance to >1500 for progression to age norm community ambulator and improve gait ability    Baseline 4/27: 960 ft with one seated rest break    Time 8    Period Weeks    Status New    Target Date 07/24/20      PT LONG TERM GOAL #2    Title Patient will ascend/descend 4 stairs without rail assist independently without loss of balance to improve ability to get in/out of home.    Baseline see above  Time 8    Period Weeks    Status Achieved      PT LONG TERM GOAL #3   Title Patient will increase 10 meter walk test to >1.85m/s as to improve gait speed for better community ambulation and to reduce fall risk.    Baseline 12/13/19= .53m/sec, 01/26: 1.36 m/sec 4.27: >1.0    Time 8    Period Weeks    Status Achieved      PT LONG TERM GOAL #4   Title Patient (> 64 years old) will complete five times sit to stand test in < 15 seconds indicating an increased LE strength and improved balance.    Baseline 01/26: 18s 4/27: 13.8 seconds no UE support    Time 8    Period Weeks    Status Partially Met    Target Date 07/24/20      PT LONG TERM GOAL #5   Title Patient will increase ABC scale score >80% to demonstrate better functional mobility and better confidence with ADLs    Baseline 01/26: 76% 4/27: 55%    Time 8    Period Weeks    Status Partially Met    Target Date 07/24/20      Additional Long Term Goals   Additional Long Term Goals Yes      PT LONG TERM GOAL #6   Title Patient will increase FOTO score to equal to or greater than  85%   to demonstrate statistically significant improvement in mobility and quality of life.    Baseline 4/27: 61%    Time 8    Period Weeks    Status New    Target Date 07/24/20                 Plan - 05/29/20 4098    Clinical Impression Statement Patient returning to therapy after multiple weeks absence due to medical issues. Her goals were performed with additional goal of 6 minute walk test added to address patient's capacity for functional mobility. Patient very fatigued after goal performance, upon attempting to perform standing treatments she had cold chills and dizziness requiring her to sit back down. Blood pressure and vitals retested and in low but functional range.  Patient will benefit from continued skilled physical therapy to increase strength, stability, and mobility for return to PLOF and improved QOL.    Personal Factors and Comorbidities Comorbidity 1;Comorbidity 2;Comorbidity 3+    Comorbidities Patient has PMHx :Hyperparathyroidism, HTN Hep C: Anemia ESRD: She gets dialysis 3 x week. She follows with nephrology. She is currently waiting to be listed at [facility] for a kidney transplant.    Stability/Clinical Decision Making Stable/Uncomplicated    Rehab Potential Good    PT Frequency 1x / week    PT Duration 8 weeks    PT Treatment/Interventions Therapeutic exercise;Therapeutic activities;Balance training;Neuromuscular re-education;Stair training;Gait training;Energy conservation;Visual/perceptual remediation/compensation;Patient/family education    PT Next Visit Plan continue to progress strength and activity tolerance    PT Home Exercise Plan no updates this session    Consulted and Agree with Plan of Care Patient           Patient will benefit from skilled therapeutic intervention in order to improve the following deficits and impairments:  Abnormal gait,Decreased activity tolerance,Difficulty walking  Visit Diagnosis: Muscle weakness (generalized)  Gait instability     Problem List Patient Active Problem List   Diagnosis Date Noted  . A-V fistula ([place]) 04/26/2019  . ESRD (end stage renal disease) ([place])  10/26/2018  . Secondary hyperparathyroidism of renal origin ([place]) 09/06/2018  . Anemia due to stage 5 chronic kidney disease ([place]) 02/26/2018  . Hepatitis C 02/15/2013  . Essential hypertension, benign 12/26/2008   Kimberly Frost, PT, DPT   05/29/2020, 9:40 AM  [place] MAIN [facility] SERVICES [address] [place], [place], [address] Phone: [phone]   Fax:  [phone]  Name: Kimberly Frost MRN: [id number] Date of Birth: Jun 15, 1950

## 2020-05-30 DIAGNOSIS — Z992 Dependence on renal dialysis: Secondary | ICD-10-CM | POA: Diagnosis not present

## 2020-05-30 DIAGNOSIS — D509 Iron deficiency anemia, unspecified: Secondary | ICD-10-CM | POA: Diagnosis not present

## 2020-05-30 DIAGNOSIS — N2581 Secondary hyperparathyroidism of renal origin: Secondary | ICD-10-CM | POA: Diagnosis not present

## 2020-05-30 DIAGNOSIS — N186 End stage renal disease: Secondary | ICD-10-CM | POA: Diagnosis not present

## 2020-05-30 DIAGNOSIS — D689 Coagulation defect, unspecified: Secondary | ICD-10-CM | POA: Diagnosis not present

## 2020-06-01 DIAGNOSIS — D509 Iron deficiency anemia, unspecified: Secondary | ICD-10-CM | POA: Diagnosis not present

## 2020-06-01 DIAGNOSIS — N186 End stage renal disease: Secondary | ICD-10-CM | POA: Diagnosis not present

## 2020-06-01 DIAGNOSIS — I129 Hypertensive chronic kidney disease with stage 1 through stage 4 chronic kidney disease, or unspecified chronic kidney disease: Secondary | ICD-10-CM | POA: Diagnosis not present

## 2020-06-01 DIAGNOSIS — D689 Coagulation defect, unspecified: Secondary | ICD-10-CM | POA: Diagnosis not present

## 2020-06-01 DIAGNOSIS — N2581 Secondary hyperparathyroidism of renal origin: Secondary | ICD-10-CM | POA: Diagnosis not present

## 2020-06-01 DIAGNOSIS — Z992 Dependence on renal dialysis: Secondary | ICD-10-CM | POA: Diagnosis not present

## 2020-06-04 DIAGNOSIS — N186 End stage renal disease: Secondary | ICD-10-CM | POA: Diagnosis not present

## 2020-06-04 DIAGNOSIS — N2581 Secondary hyperparathyroidism of renal origin: Secondary | ICD-10-CM | POA: Diagnosis not present

## 2020-06-04 DIAGNOSIS — D689 Coagulation defect, unspecified: Secondary | ICD-10-CM | POA: Diagnosis not present

## 2020-06-04 DIAGNOSIS — D509 Iron deficiency anemia, unspecified: Secondary | ICD-10-CM | POA: Diagnosis not present

## 2020-06-04 DIAGNOSIS — Z992 Dependence on renal dialysis: Secondary | ICD-10-CM | POA: Diagnosis not present

## 2020-06-04 NOTE — Telephone Encounter (Signed)
Spoke with patient. We can send RX for Prolia to [facility] on [address] does not order these, to see how much it will be with her insurance to get it through them, may need PA also, and if the cost is affordable for the patient she is agreeable with proceeding and picking Prolia from the pharmacy.  Patient was seen Webb Silversmith and has a new patient appointment with [place] location on 6/30 which will put patient even more over due for her injection if she waits to get established.  Dr Damita Dunnings, is it ok to order RX and BMP lab under you for the patient? Thank you

## 2020-06-05 ENCOUNTER — Ambulatory Visit: Payer: Medicare Other

## 2020-06-05 NOTE — Telephone Encounter (Signed)
Spoke with patient and advised labs are needed before we can send in rx. Patient is going to dialysis tomorrow and they usually check labs. I advised patient if she can have BMET and Vit D checked in her lab work there and they fax [place] the results that is fine but if not she needs to call back for lab appt. Patient agrees to do so and will call back to let us know either way if labs can or can not be done.

## 2020-06-05 NOTE — Telephone Encounter (Signed)
I would recheck BMET and vit D first.  If labs are reasonable, then I can send the rx for prolia.  If her vit D level is really low, we need to address that first.    I put in the lab orders.  Please let me know when she is coming in for labs/route this back to me so I can put a reminder in the chart.  Thanks.

## 2020-06-05 NOTE — Telephone Encounter (Signed)
Options are limited given her kidney disease.  She is not a candidate for Fosamax.  I think it makes sense to get her labs done first and see what options are possible at that point.  It will not really matter initially about 1 medicine or the other if her vitamin D is still low-that will need to be addressed first.  The other issue is that I am not comfortable prescribing a completely separate class of medication for patient that I have not seen otherwise.  If she is going to do something other than Prolia she would need an office visit.  It may make sense to take this up with her new primary.

## 2020-06-05 NOTE — Telephone Encounter (Signed)
Spoke with patient. Prolia RX to get through the pharmacy will be more expensive. Dicussed in details the benefits of this injection and patient understands but is worried due to current finances and if she will not be able to pay for this. I advised patient she may not end up owing anything on this injection like last year but could not guarantee.  Patient wanted to ask if there was another option she can try like Fosamax or something else that would possibly be less expensive. She has not taking or tried anything else for osteoporosis. She had her first prolia injection September 2021. Please review when able and let me know and I will call patient back. Thank you.

## 2020-06-05 NOTE — Addendum Note (Signed)
Addended by: Tonia Ghent on: 06/05/2020 10:37 AM   Modules accepted: Orders

## 2020-06-06 DIAGNOSIS — Z992 Dependence on renal dialysis: Secondary | ICD-10-CM | POA: Diagnosis not present

## 2020-06-06 DIAGNOSIS — D509 Iron deficiency anemia, unspecified: Secondary | ICD-10-CM | POA: Diagnosis not present

## 2020-06-06 DIAGNOSIS — N2581 Secondary hyperparathyroidism of renal origin: Secondary | ICD-10-CM | POA: Diagnosis not present

## 2020-06-06 DIAGNOSIS — D689 Coagulation defect, unspecified: Secondary | ICD-10-CM | POA: Diagnosis not present

## 2020-06-06 DIAGNOSIS — N186 End stage renal disease: Secondary | ICD-10-CM | POA: Diagnosis not present

## 2020-06-07 NOTE — Telephone Encounter (Signed)
Spoke with patient about below message. Patient still waiting on word about results; I have not seen them yet. Patient states she has a copy of results and will bring them by Monday if we have not gotten them yet. She is going to call [place] office Monday and see if NP appt can be moved up.

## 2020-06-08 DIAGNOSIS — D689 Coagulation defect, unspecified: Secondary | ICD-10-CM | POA: Diagnosis not present

## 2020-06-08 DIAGNOSIS — D509 Iron deficiency anemia, unspecified: Secondary | ICD-10-CM | POA: Diagnosis not present

## 2020-06-08 DIAGNOSIS — N2581 Secondary hyperparathyroidism of renal origin: Secondary | ICD-10-CM | POA: Diagnosis not present

## 2020-06-08 DIAGNOSIS — Z992 Dependence on renal dialysis: Secondary | ICD-10-CM | POA: Diagnosis not present

## 2020-06-08 DIAGNOSIS — N186 End stage renal disease: Secondary | ICD-10-CM | POA: Diagnosis not present

## 2020-06-11 DIAGNOSIS — D631 Anemia in chronic kidney disease: Secondary | ICD-10-CM | POA: Diagnosis not present

## 2020-06-11 DIAGNOSIS — Z992 Dependence on renal dialysis: Secondary | ICD-10-CM | POA: Diagnosis not present

## 2020-06-11 DIAGNOSIS — N186 End stage renal disease: Secondary | ICD-10-CM | POA: Diagnosis not present

## 2020-06-11 DIAGNOSIS — D689 Coagulation defect, unspecified: Secondary | ICD-10-CM | POA: Diagnosis not present

## 2020-06-11 DIAGNOSIS — N2581 Secondary hyperparathyroidism of renal origin: Secondary | ICD-10-CM | POA: Diagnosis not present

## 2020-06-11 DIAGNOSIS — D509 Iron deficiency anemia, unspecified: Secondary | ICD-10-CM | POA: Diagnosis not present

## 2020-06-12 ENCOUNTER — Other Ambulatory Visit: Payer: Self-pay

## 2020-06-12 ENCOUNTER — Ambulatory Visit: Payer: Medicare Other | Attending: Internal Medicine

## 2020-06-12 ENCOUNTER — Other Ambulatory Visit (INDEPENDENT_AMBULATORY_CARE_PROVIDER_SITE_OTHER): Payer: Self-pay | Admitting: Vascular Surgery

## 2020-06-12 DIAGNOSIS — M6281 Muscle weakness (generalized): Secondary | ICD-10-CM

## 2020-06-12 DIAGNOSIS — N186 End stage renal disease: Secondary | ICD-10-CM

## 2020-06-12 DIAGNOSIS — R2681 Unsteadiness on feet: Secondary | ICD-10-CM | POA: Diagnosis not present

## 2020-06-12 NOTE — Therapy (Signed)
[place] MAIN [facility] SERVICES [address] [place], [place], [address] Phone: [phone]   Fax:  [phone]  Physical Therapy Treatment Physical Therapy Progress Note   Dates of reporting period  01/31/20   to   06/12/20  Patient Details  Name: Kimberly Frost MRN: [id number] Date of Birth: [birth year] Referring Provider (PT): Elmarie Shiley   Encounter Date: 06/12/2020   PT End of Session - 06/12/20 0757    Visit Number 20    Number of Visits 27    Date for PT Re-Evaluation 07/24/20    Authorization Type UHC medicare    Authorization Time Period next session 1/10 PN 5/11    PT Start Time 0759    PT Stop Time 0842    PT Time Calculation (min) 43 min    Activity Tolerance Patient tolerated treatment well;Patient limited by fatigue    Behavior During Therapy [facility] for tasks assessed/performed           Past Medical History:  Diagnosis Date  . Chronic kidney disease   . Edema    RLE  . Hepatitis    Hep C  . History of blood transfusion   . Hypertension   . Wears dentures     Past Surgical History:  Procedure Laterality Date  . A/V FISTULAGRAM Left 04/11/2019   Procedure: A/V FISTULAGRAM;  Surgeon: Katha Cabal, MD;  Location: [place] [facility];  Service: Cardiovascular;  Laterality: Left;  . A/V FISTULAGRAM Left 05/01/2019   Procedure: A/V FISTULAGRAM;  Surgeon: Algernon Huxley, MD;  Location: [place] [facility];  Service: Cardiovascular;  Laterality: Left;  . A/V FISTULAGRAM Left 01/15/2020   Procedure: A/V FISTULAGRAM;  Surgeon: Algernon Huxley, MD;  Location: [place] [facility];  Service: Cardiovascular;  Laterality: Left;  . A/V FISTULAGRAM Left 04/22/2020   Procedure: A/V FISTULAGRAM;  Surgeon: Algernon Huxley, MD;  Location: [place] [facility];  Service: Cardiovascular;  Laterality: Left;  . AV FISTULA PLACEMENT Left 06/16/2018   Procedure: Left Arm ARTERIOVENOUS (AV) FISTULA CREATION;  Surgeon: Marty Heck, MD;   Location: [place];  Service: Vascular;  Laterality: Left;  Marland Kitchen MULTIPLE TOOTH EXTRACTIONS    . MYOMECTOMY      There were no vitals filed for this visit.   Subjective Assessment - 06/12/20 0800    Subjective Patient missed last session due to ride issues. No falls or LOB since last session.    Pertinent History Patient has been having dialysis over a year and was having some problems with passing out due to BP regulation and was using a RW . Now she is not having the passing out symptoms. She has good days and bad days at the hospital. She is trying to get a kidney and the nurse thought she needed to have PT to have her use a RW. She is currently not using a RW. If she feels weak after dialysis she uses a scooter at the store if she needs it.    Currently in Pain? No/denies                 Patient missed last session due to ride issues. No falls or LOB since last session.    Progress note Goals performed last session (05/29/20), please refer to this note for further details.   BP at start of session: 123/67   Standing by support bar: Standing with CGA next to support surface:  Airex pad: static  stand 30 seconds x 2 trials, noticeable trembling of ankles/LE's with fatigue and challenge to maintain stability Airex pad: horizontal head turns 30 seconds scanning room 10x ; cueing for arc of motion  Airex pad: vertical head turns 30 seconds, cueing for arc of motion, noticeable sway with upward gaze increasing demand on ankle righting reaction musculature Lateral step over orange hurdle 10x each side ; BUE support no LOB Forward step over orange hurdle and back 10x each LE, SUE support Squat to chair with BUE support on bar for focus on posterior muscle activation.  Modified lateral squat 10x each side ; cues for gluteal activation ane foot positioning  3 way hip hike with BUE support (forward, lateral, backwards); 10x each direction each LE cues for sequencing.   Seated:  All  exercises were completed with 3# ankle weights:  -Seated marches x 15 reps each LE; x 2 sets  -Seated LAQ x 10 reps  With 3 second holds alternating each LE -Alternating ER/IR 15x each LE -Heel toe raises 15x;    Seated hip add squeezes x 10 reps with 3 second holds with cues for muscle activation and ball placement Seated clamshells with GTB x 20x reps GTB hamstring curl against PT resistance 15x each LE       Pt educated throughout session about proper posture and technique with exercises. Improved exercise technique, movement at target joints, use of target muscles after min to mod verbal, visual, tactile cues.  Patient's condition has the potential to improve in response to therapy. Maximum improvement is yet to be obtained. The anticipated improvement is attainable and reasonable in a generally predictable time.  Patient reports she is weak and wants to be more strong and steady.     Goals performed last session (05/29/20), please refer to this note for further details. Patient is able to tolerate standing interventions this session for first time in weeks with her vitals remaining in therapeutic range. Patient's condition has the potential to improve in response to therapy. Maximum improvement is yet to be obtained. The anticipated improvement is attainable and reasonable in a generally predictable time.  Patient will continue to benefit from skilled physical therapy to improve generalized strength, ROM, and capacity for functional activity.               PT Education - 06/12/20 0756    Education Details exercise technique, body mechanics    Person(s) Educated Patient    Methods Explanation;Demonstration;Tactile cues;Verbal cues    Comprehension Verbalized understanding;Returned demonstration;Verbal cues required;Tactile cues required            PT Short Term Goals - 05/29/20 4383      PT SHORT TERM GOAL #1   Title Patient will be independent in home exercise program  to improve strength/mobility for better functional independence with ADLs.    Baseline 4/27: unable to when she was having medication issues    Time 4    Period Weeks    Status Partially Met    Target Date 06/26/20      PT SHORT TERM GOAL #2   Title Patient will increase BLE gross strength to 4+/5 as to improve functional strength for independent gait, increased standing tolerance and increased ADL ability.    Baseline 01/26: 4/5 4/27: see note    Time 4    Period Weeks    Status Partially Met    Target Date 06/26/20      PT SHORT TERM GOAL #3  Title Patient will ascend/descend 4 stairs without rail assist independently without loss of balance to improve ability to get in/out of home.    Baseline 12/29: able to perform 4 stairs x3 with b/l rail today and no loss of balance    Time 8    Period Weeks    Status Achieved    Target Date 01/10/20             PT Long Term Goals - 05/29/20 0822      PT LONG TERM GOAL #1   Title Patient will increase six minute walk test distance to >1500 for progression to age norm community ambulator and improve gait ability    Baseline 4/27: 960 ft with one seated rest break    Time 8    Period Weeks    Status New    Target Date 07/24/20      PT LONG TERM GOAL #2   Title Patient will ascend/descend 4 stairs without rail assist independently without loss of balance to improve ability to get in/out of home.    Baseline see above    Time 8    Period Weeks    Status Achieved      PT LONG TERM GOAL #3   Title Patient will increase 10 meter walk test to >1.4ms as to improve gait speed for better community ambulation and to reduce fall risk.    Baseline 12/13/19= .89mec, 01/26: 1.36 m/sec 4.27: >1.0    Time 8    Period Weeks    Status Achieved      PT LONG TERM GOAL #4   Title Patient (> 6075ears old) will complete five times sit to stand test in < 15 seconds indicating an increased LE strength and improved balance.    Baseline 01/26: 18s  4/27: 13.8 seconds no UE support    Time 8    Period Weeks    Status Partially Met    Target Date 07/24/20      PT LONG TERM GOAL #5   Title Patient will increase ABC scale score >80% to demonstrate better functional mobility and better confidence with ADLs    Baseline 01/26: 76% 4/27: 55%    Time 8    Period Weeks    Status Partially Met    Target Date 07/24/20      Additional Long Term Goals   Additional Long Term Goals Yes      PT LONG TERM GOAL #6   Title Patient will increase FOTO score to equal to or greater than  85%   to demonstrate statistically significant improvement in mobility and quality of life.    Baseline 4/27: 61%    Time 8    Period Weeks    Status New    Target Date 07/24/20                 Plan - 06/12/20 0810    Clinical Impression Statement Goals performed last session (05/29/20), please refer to this note for further details. Patient is able to tolerate standing interventions this session for first time in weeks with her vitals remaining in therapeutic range. Patient's condition has the potential to improve in response to therapy. Maximum improvement is yet to be obtained. The anticipated improvement is attainable and reasonable in a generally predictable time.  Patient will continue to benefit from skilled physical therapy to improve generalized strength, ROM, and capacity for functional activity.    Personal Factors and Comorbidities Comorbidity 1;Comorbidity  2;Comorbidity 3+    Comorbidities Patient has PMHx :Hyperparathyroidism, HTN Hep C: Anemia ESRD: She gets dialysis 3 x week. She follows with nephrology. She is currently waiting to be listed at [facility] for a kidney transplant.    Stability/Clinical Decision Making Stable/Uncomplicated    Rehab Potential Good    PT Frequency 1x / week    PT Duration 8 weeks    PT Treatment/Interventions Therapeutic exercise;Therapeutic activities;Balance training;Neuromuscular re-education;Stair  training;Gait training;Energy conservation;Visual/perceptual remediation/compensation;Patient/family education    PT Next Visit Plan continue to progress strength and activity tolerance    PT Home Exercise Plan no updates this session    Consulted and Agree with Plan of Care Patient           Patient will benefit from skilled therapeutic intervention in order to improve the following deficits and impairments:  Abnormal gait,Decreased activity tolerance,Difficulty walking  Visit Diagnosis: Muscle weakness (generalized)  Gait instability     Problem List Patient Active Problem List   Diagnosis Date Noted  . A-V fistula ([place]) 04/26/2019  . ESRD (end stage renal disease) ([place]) 10/26/2018  . Secondary hyperparathyroidism of renal origin ([place]) 09/06/2018  . Anemia due to stage 5 chronic kidney disease ([place]) 02/26/2018  . Hepatitis C 02/15/2013  . Essential hypertension, benign 12/26/2008   Janna Arch, PT, DPT   06/12/2020, 8:44 AM  [place] MAIN [facility] SERVICES [address] [place], [place], [address] Phone: [phone]   Fax:  [phone]  Name: Kimberly Frost MRN: [id number] Date of Birth: [birth year]

## 2020-06-13 DIAGNOSIS — Z992 Dependence on renal dialysis: Secondary | ICD-10-CM | POA: Diagnosis not present

## 2020-06-13 DIAGNOSIS — N186 End stage renal disease: Secondary | ICD-10-CM | POA: Diagnosis not present

## 2020-06-13 DIAGNOSIS — D689 Coagulation defect, unspecified: Secondary | ICD-10-CM | POA: Diagnosis not present

## 2020-06-13 DIAGNOSIS — D509 Iron deficiency anemia, unspecified: Secondary | ICD-10-CM | POA: Diagnosis not present

## 2020-06-13 DIAGNOSIS — N2581 Secondary hyperparathyroidism of renal origin: Secondary | ICD-10-CM | POA: Diagnosis not present

## 2020-06-13 DIAGNOSIS — D631 Anemia in chronic kidney disease: Secondary | ICD-10-CM | POA: Diagnosis not present

## 2020-06-15 DIAGNOSIS — Z992 Dependence on renal dialysis: Secondary | ICD-10-CM | POA: Diagnosis not present

## 2020-06-15 DIAGNOSIS — D689 Coagulation defect, unspecified: Secondary | ICD-10-CM | POA: Diagnosis not present

## 2020-06-15 DIAGNOSIS — D509 Iron deficiency anemia, unspecified: Secondary | ICD-10-CM | POA: Diagnosis not present

## 2020-06-15 DIAGNOSIS — D631 Anemia in chronic kidney disease: Secondary | ICD-10-CM | POA: Diagnosis not present

## 2020-06-15 DIAGNOSIS — N186 End stage renal disease: Secondary | ICD-10-CM | POA: Diagnosis not present

## 2020-06-15 DIAGNOSIS — N2581 Secondary hyperparathyroidism of renal origin: Secondary | ICD-10-CM | POA: Diagnosis not present

## 2020-06-17 ENCOUNTER — Other Ambulatory Visit: Payer: Self-pay

## 2020-06-17 ENCOUNTER — Ambulatory Visit (INDEPENDENT_AMBULATORY_CARE_PROVIDER_SITE_OTHER): Payer: Medicare Other | Admitting: Nurse Practitioner

## 2020-06-17 ENCOUNTER — Ambulatory Visit (INDEPENDENT_AMBULATORY_CARE_PROVIDER_SITE_OTHER): Payer: Medicare Other

## 2020-06-17 ENCOUNTER — Encounter (INDEPENDENT_AMBULATORY_CARE_PROVIDER_SITE_OTHER): Payer: Self-pay | Admitting: Nurse Practitioner

## 2020-06-17 VITALS — BP 120/65 | HR 89 | Resp 16 | Wt 133.8 lb

## 2020-06-17 DIAGNOSIS — I10 Essential (primary) hypertension: Secondary | ICD-10-CM

## 2020-06-17 DIAGNOSIS — N186 End stage renal disease: Secondary | ICD-10-CM | POA: Diagnosis not present

## 2020-06-18 DIAGNOSIS — Z992 Dependence on renal dialysis: Secondary | ICD-10-CM | POA: Diagnosis not present

## 2020-06-18 DIAGNOSIS — D509 Iron deficiency anemia, unspecified: Secondary | ICD-10-CM | POA: Diagnosis not present

## 2020-06-18 DIAGNOSIS — D689 Coagulation defect, unspecified: Secondary | ICD-10-CM | POA: Diagnosis not present

## 2020-06-18 DIAGNOSIS — N2581 Secondary hyperparathyroidism of renal origin: Secondary | ICD-10-CM | POA: Diagnosis not present

## 2020-06-18 DIAGNOSIS — N186 End stage renal disease: Secondary | ICD-10-CM | POA: Diagnosis not present

## 2020-06-19 ENCOUNTER — Ambulatory Visit: Payer: Medicare Other

## 2020-06-19 ENCOUNTER — Other Ambulatory Visit: Payer: Self-pay

## 2020-06-19 DIAGNOSIS — M6281 Muscle weakness (generalized): Secondary | ICD-10-CM

## 2020-06-19 DIAGNOSIS — R2681 Unsteadiness on feet: Secondary | ICD-10-CM | POA: Diagnosis not present

## 2020-06-19 NOTE — Telephone Encounter (Signed)
Noted.  Thanks.  I will check this when I am back at the office, when possible.

## 2020-06-19 NOTE — Telephone Encounter (Signed)
I received lab results after asking their office to re fax and have placed them in your inbox now. Thank you

## 2020-06-19 NOTE — Telephone Encounter (Signed)
Dr Damita Dunnings have you seen lab results on this patient from [facility] in [place]: [phone]:? I went ahead and called them and asked to get it re faxed, this was originally from a week ago. Spoke with patient today and let her know that we will get back in touch with her once reviewed.

## 2020-06-19 NOTE — Therapy (Signed)
[name] [facility] SERVICES [address] [place], [place], [address] Phone: [phone]   Fax:  [phone]  Physical Therapy Treatment  Patient Details  Name: Kimberly Frost MRN: [id number] Date of Birth: 1950/04/08 Referring Provider (PT): Elmarie Shiley   Encounter Date: 06/19/2020   PT End of Session - 06/19/20 0752    Visit Number 21    Number of Visits 27    Date for PT Re-Evaluation 07/24/20    Authorization Type UHC medicare    Authorization Time Period 1/10 PN 5/11    PT Start Time 0759    PT Stop Time 0844    PT Time Calculation (min) 45 min    Activity Tolerance Patient tolerated treatment well;Patient limited by fatigue    Behavior During Therapy [facility] for tasks assessed/performed           Past Medical History:  Diagnosis Date  . Chronic kidney disease   . Edema    RLE  . Hepatitis    Hep C  . History of blood transfusion   . Hypertension   . Wears dentures     Past Surgical History:  Procedure Laterality Date  . A/V FISTULAGRAM Left 04/11/2019   Procedure: A/V FISTULAGRAM;  Surgeon: Katha Cabal, MD;  Location: [place] [facility];  Service: Cardiovascular;  Laterality: Left;  . A/V FISTULAGRAM Left 05/01/2019   Procedure: A/V FISTULAGRAM;  Surgeon: Algernon Huxley, MD;  Location: [place] [facility];  Service: Cardiovascular;  Laterality: Left;  . A/V FISTULAGRAM Left 01/15/2020   Procedure: A/V FISTULAGRAM;  Surgeon: Algernon Huxley, MD;  Location: [place] [facility];  Service: Cardiovascular;  Laterality: Left;  . A/V FISTULAGRAM Left 04/22/2020   Procedure: A/V FISTULAGRAM;  Surgeon: Algernon Huxley, MD;  Location: [place] [facility];  Service: Cardiovascular;  Laterality: Left;  . AV FISTULA PLACEMENT Left 06/16/2018   Procedure: Left Arm ARTERIOVENOUS (AV) FISTULA CREATION;  Surgeon: Marty Heck, MD;  Location: [place];  Service: Vascular;  Laterality: Left;  Marland Kitchen MULTIPLE TOOTH EXTRACTIONS    .  MYOMECTOMY      There were no vitals filed for this visit.   Subjective Assessment - 06/19/20 0803    Subjective Patient reports she slept well last night. No falls or LOB since last session. Is cold from her medications.    Pertinent History Patient has been having dialysis over a year and was having some problems with passing out due to BP regulation and was using a RW . Now she is not having the passing out symptoms. She has good days and bad days at the hospital. She is trying to get a kidney and the nurse thought she needed to have PT to have her use a RW. She is currently not using a RW. If she feels weak after dialysis she uses a scooter at the store if she needs it.    Currently in Pain? No/denies                Patient reports she slept well last night. No falls or LOB since last session. Is cold from her medications.      BP at start of session: 114/60    Standing by support bar: Standing with CGA next to support surface:  4" step: toe taps no UE support 20x each LE 4" step: lateral step up/down 12x each LE; frequent cues for foot placement and reduction of crossing of legs.  4" step up/down 10x each LE; cueing for sequencing due to confusion with altering LE's, rest break required after due to elevated HR.    Airex pad: static stand 30 seconds x 2 trials, noticeable trembling of ankles/LE's with fatigue and challenge to maintain stability Airex pad: horizontal head turns 30 seconds scanning room 10x ; cueing for arc of motion  Airex pad: vertical head turns 30 seconds, cueing for arc of motion, noticeable sway with upward gaze increasing demand on ankle righting reaction musculature Modified lateral squat 10x each side ; cues for gluteal activation ane foot positioning  3 way hip hike with BUE support (forward, lateral, backwards); 10x each direction each LE cues for sequencing.  Single limb stance 30 seconds x 2 trials    Seated:  Seated hip add squeezes x 10 reps  with 3 second holds with cues for muscle activation and ball placement Seated clamshells with GTB x 20x reps GTB hamstring curl against PT resistance 15x each LE     Pt educated throughout session about proper posture and technique with exercises. Improved exercise technique, movement at target joints, use of target muscles after min to mod verbal, visual, tactile cues.  Access Code: [email or web address] URL: [email or web address] Date: 06/19/2020 Prepared by: Janna Arch  Exercises Standing Single Leg Stance with Counter Support - 1 x daily - 7 x weekly - 2 sets - 10 reps - 5 hold Standing Hip Extension with Leg Bent and Support - 1 x daily - 7 x weekly - 2 sets - 10 reps - 5 hold Forward Lunge with Back Leg Straight and Counter Support - 1 x daily - 7 x weekly - 2 sets - 10 reps - 5 hold Standing 4-Way Leg Reach with Counter Support - 1 x daily - 7 x weekly - 2 sets - 10 reps - 5 hold Mini Squat with Counter Support - 1 x daily - 7 x weekly - 2 sets - 10 reps - 5 hold   Patient is highly motivated throughout physical therapy session . Vitals monitored throughout session with HR elevation required rest breaks. LE strength is improving as well as ankle righting reactions on stable and unstable surfaces. She is limited in capacity for functional activity by fatigue. Patient will continue to benefit from skilled physical therapy to improve generalized strength, ROM, and capacity for functional activity.                  PT Education - 06/19/20 0751    Education Details exercise technique, body mechanics    Person(s) Educated Patient    Methods Explanation;Demonstration;Tactile cues;Verbal cues    Comprehension Verbalized understanding;Returned demonstration;Verbal cues required;Tactile cues required            PT Short Term Goals - 05/29/20 9833      PT SHORT TERM GOAL #1   Title Patient will be independent in home exercise program to improve strength/mobility for  better functional independence with ADLs.    Baseline 4/27: unable to when she was having medication issues    Time 4    Period Weeks    Status Partially Met    Target Date 06/26/20      PT SHORT TERM GOAL #2   Title Patient will increase BLE gross strength to 4+/5 as to improve functional strength for independent gait, increased standing tolerance and increased ADL ability.    Baseline 01/26: 4/5 4/27: see note    Time 4    Period Weeks  Status Partially Met    Target Date 06/26/20      PT SHORT TERM GOAL #3   Title Patient will ascend/descend 4 stairs without rail assist independently without loss of balance to improve ability to get in/out of home.    Baseline 12/29: able to perform 4 stairs x3 with b/l rail today and no loss of balance    Time 8    Period Weeks    Status Achieved    Target Date 01/10/20             PT Long Term Goals - 05/29/20 0822      PT LONG TERM GOAL #1   Title Patient will increase six minute walk test distance to >1500 for progression to age norm community ambulator and improve gait ability    Baseline 4/27: 960 ft with one seated rest break    Time 8    Period Weeks    Status New    Target Date 07/24/20      PT LONG TERM GOAL #2   Title Patient will ascend/descend 4 stairs without rail assist independently without loss of balance to improve ability to get in/out of home.    Baseline see above    Time 8    Period Weeks    Status Achieved      PT LONG TERM GOAL #3   Title Patient will increase 10 meter walk test to >1.17m/s as to improve gait speed for better community ambulation and to reduce fall risk.    Baseline 12/13/19= .59m/sec, 01/26: 1.36 m/sec 4.27: >1.0    Time 8    Period Weeks    Status Achieved      PT LONG TERM GOAL #4   Title Patient (> 86 years old) will complete five times sit to stand test in < 15 seconds indicating an increased LE strength and improved balance.    Baseline 01/26: 18s 4/27: 13.8 seconds no UE support     Time 8    Period Weeks    Status Partially Met    Target Date 07/24/20      PT LONG TERM GOAL #5   Title Patient will increase ABC scale score >80% to demonstrate better functional mobility and better confidence with ADLs    Baseline 01/26: 76% 4/27: 55%    Time 8    Period Weeks    Status Partially Met    Target Date 07/24/20      Additional Long Term Goals   Additional Long Term Goals Yes      PT LONG TERM GOAL #6   Title Patient will increase FOTO score to equal to or greater than  85%   to demonstrate statistically significant improvement in mobility and quality of life.    Baseline 4/27: 61%    Time 8    Period Weeks    Status New    Target Date 07/24/20                 Plan - 06/19/20 0824    Clinical Impression Statement Patient is highly motivated throughout physical therapy session . Vitals monitored throughout session with HR elevation required rest breaks. LE strength is improving as well as ankle righting reactions on stable and unstable surfaces. She is limited in capacity for functional activity by fatigue. Patient will continue to benefit from skilled physical therapy to improve generalized strength, ROM, and capacity for functional activity.    Personal Factors and Comorbidities Comorbidity 1;Comorbidity  2;Comorbidity 3+    Comorbidities Patient has PMHx :Hyperparathyroidism, HTN Hep C: Anemia ESRD: She gets dialysis 3 x week. She follows with nephrology. She is currently waiting to be listed at [facility] for a kidney transplant.    Stability/Clinical Decision Making Stable/Uncomplicated    Rehab Potential Good    PT Frequency 1x / week    PT Duration 8 weeks    PT Treatment/Interventions Therapeutic exercise;Therapeutic activities;Balance training;Neuromuscular re-education;Stair training;Gait training;Energy conservation;Visual/perceptual remediation/compensation;Patient/family education    PT Next Visit Plan continue to progress strength and activity  tolerance    PT Home Exercise Plan no updates this session    Consulted and Agree with Plan of Care Patient           Patient will benefit from skilled therapeutic intervention in order to improve the following deficits and impairments:  Abnormal gait,Decreased activity tolerance,Difficulty walking  Visit Diagnosis: Muscle weakness (generalized)  Gait instability     Problem List Patient Active Problem List   Diagnosis Date Noted  . A-V fistula ([place]) 04/26/2019  . ESRD (end stage renal disease) ([place]) 10/26/2018  . Secondary hyperparathyroidism of renal origin ([place]) 09/06/2018  . Anemia due to stage 5 chronic kidney disease ([place]) 02/26/2018  . Hepatitis C 02/15/2013  . Essential hypertension, benign 12/26/2008   Janna Arch, PT, DPT   06/19/2020, 8:45 AM  [place] MAIN [facility] SERVICES [address] [place], [place], [address] Phone: [phone]   Fax:  [phone]  Name: Kimberly Frost MRN: [id number] Date of Birth: 08/12/1950

## 2020-06-19 NOTE — Telephone Encounter (Signed)
I do not recall seeing labs on this patient in the meantime.  I am not in the office today so they may be in my inbox, having come in today.

## 2020-06-20 DIAGNOSIS — D509 Iron deficiency anemia, unspecified: Secondary | ICD-10-CM | POA: Diagnosis not present

## 2020-06-20 DIAGNOSIS — N2581 Secondary hyperparathyroidism of renal origin: Secondary | ICD-10-CM | POA: Diagnosis not present

## 2020-06-20 DIAGNOSIS — N186 End stage renal disease: Secondary | ICD-10-CM | POA: Diagnosis not present

## 2020-06-20 DIAGNOSIS — Z992 Dependence on renal dialysis: Secondary | ICD-10-CM | POA: Diagnosis not present

## 2020-06-20 DIAGNOSIS — D689 Coagulation defect, unspecified: Secondary | ICD-10-CM | POA: Diagnosis not present

## 2020-06-22 DIAGNOSIS — D509 Iron deficiency anemia, unspecified: Secondary | ICD-10-CM | POA: Diagnosis not present

## 2020-06-22 DIAGNOSIS — D689 Coagulation defect, unspecified: Secondary | ICD-10-CM | POA: Diagnosis not present

## 2020-06-22 DIAGNOSIS — N2581 Secondary hyperparathyroidism of renal origin: Secondary | ICD-10-CM | POA: Diagnosis not present

## 2020-06-22 DIAGNOSIS — N186 End stage renal disease: Secondary | ICD-10-CM | POA: Diagnosis not present

## 2020-06-22 DIAGNOSIS — Z992 Dependence on renal dialysis: Secondary | ICD-10-CM | POA: Diagnosis not present

## 2020-06-24 ENCOUNTER — Encounter (INDEPENDENT_AMBULATORY_CARE_PROVIDER_SITE_OTHER): Payer: Self-pay | Admitting: Nurse Practitioner

## 2020-06-24 NOTE — Progress Notes (Signed)
Subjective:    Patient ID: Kimberly Frost, female    DOB: [birth year], 71 y.o.   MRN: [id number] Chief Complaint  Patient presents with  . Follow-up    [facility] 2 month a/v fistulagram    The patient returns to the office for followup status post intervention of the dialysis access left brachiocephalic AV fistula.  Intervention determined there was no areas of significant stenosis seen within her access system with central venous system.  Following the intervention the access function has significantly improved, with better flow rates and improved KT/V. The patient has not been experiencing increased bleeding times following decannulation and the patient denies increased recirculation. The patient denies an increase in arm swelling. At the present time the patient denies hand pain.  The patient denies amaurosis fugax or recent TIA symptoms. There are no recent neurological changes noted. The patient denies claudication symptoms or rest pain symptoms. The patient denies history of DVT, PE or superficial thrombophlebitis. The patient denies recent episodes of angina or shortness of breath.         Review of Systems  Hematological: Does not bruise/bleed easily.  All other systems reviewed and are negative.      Objective:   Physical Exam Vitals reviewed.  HENT:     Head: Normocephalic.  Cardiovascular:     Rate and Rhythm: Normal rate.     Pulses: Normal pulses.          Radial pulses are 2+ on the left side.     Arteriovenous access: left arteriovenous access is present.    Comments: Good thrill and bruit Pulmonary:     Effort: Pulmonary effort is normal.  Neurological:     Mental Status: She is alert and oriented to person, place, and time.  Psychiatric:        Mood and Affect: Mood normal.        Behavior: Behavior normal.        Thought Content: Thought content normal.        Judgment: Judgment normal.     BP 120/65 (BP Location: Right Arm)   Pulse 89   Resp 16    Wt 133 lb 12.8 oz (60.7 kg)   BMI 21.60 kg/m   Past Medical History:  Diagnosis Date  . Chronic kidney disease   . Edema    RLE  . Hepatitis    Hep C  . History of blood transfusion   . Hypertension   . Wears dentures     Social History   Socioeconomic History  . Marital status: Single    Spouse name: Not on file  . Number of children: 0  . Years of education: Not on file  . Highest education level: Not on file  Occupational History  . Occupation: on Fish farm manager   Tobacco Use  . Smoking status: Former Smoker    Types: Cigarettes  . Smokeless tobacco: Never Used  Vaping Use  . Vaping Use: Never used  Substance and Sexual Activity  . Alcohol use: Not Currently    Alcohol/week: 6.0 standard drinks    Types: 6 Cans of beer per week  . Drug use: Not Currently    Types: Marijuana    Comment: occasional   . Sexual activity: Not Currently  Other Topics Concern  . Not on file  Social History Narrative   Lives at home in private residence with fiance' [place] Strain: Not  on file  Food Insecurity: Not on file  Transportation Needs: Not on file  Physical Activity: Not on file  Stress: Not on file  Social Connections: Not on file  Intimate Partner Violence: Not on file    Past Surgical History:  Procedure Laterality Date  . A/V FISTULAGRAM Left 04/11/2019   Procedure: A/V FISTULAGRAM;  Surgeon: Katha Cabal, MD;  Location: [facility];  Service: Cardiovascular;  Laterality: Left;  . A/V FISTULAGRAM Left 05/01/2019   Procedure: A/V FISTULAGRAM;  Surgeon: Algernon Huxley, MD;  Location: [facility] [facility];  Service: Cardiovascular;  Laterality: Left;  . A/V FISTULAGRAM Left 01/15/2020   Procedure: A/V FISTULAGRAM;  Surgeon: Algernon Huxley, MD;  Location: [facility] [facility];  Service: Cardiovascular;  Laterality: Left;  . A/V FISTULAGRAM Left 04/22/2020   Procedure: A/V FISTULAGRAM;  Surgeon: Algernon Huxley, MD;  Location: [facility];  Service: Cardiovascular;  Laterality: Left;  . AV FISTULA PLACEMENT Left 06/16/2018   Procedure: Left Arm ARTERIOVENOUS (AV) FISTULA CREATION;  Surgeon: Marty Heck, MD;  Location: [place];  Service: Vascular;  Laterality: Left;  Marland Kitchen MULTIPLE TOOTH EXTRACTIONS    . MYOMECTOMY      Family History  Problem Relation Age of Onset  . Diabetes Mother   . Hypertension Mother   . Breast cancer Mother 7  . Bone cancer Father   . Kidney cancer Maternal Grandmother   . Stroke Brother   . Diabetes Brother   . Breast cancer Maternal Aunt 53  . Breast cancer Maternal Aunt 70    No Known Allergies  CBC Latest Ref Rng & Units 07/31/2019 08/18/2018 08/04/2018  WBC 4.0 - 10.5 K/uL 8.3 - -  Hemoglobin 12.0 - 15.0 g/dL 10.3(L) 12.0 12.3  Hematocrit 36.0 - 46.0 % 30.9(L) - -  Platelets 150.0 - 400.0 K/uL 247.0 - -      CMP     Component Value Date/Time   NA 137 11/01/2019 0856   NA 138 06/23/2018 0000   K 4.3 11/01/2019 0856   K 4.9 06/23/2018 0000   CL 91 (L) 11/01/2019 0856   CO2 33 (H) 11/01/2019 0856   GLUCOSE 158 (H) 11/01/2019 0856   BUN 27 (H) 11/01/2019 0856   BUN 61 06/23/2018 0000   CREATININE 8.20 (HH) 11/01/2019 0856   CREATININE 7.13 06/23/2018 0000   CALCIUM 9.9 11/01/2019 0856   CALCIUM 8.8 06/23/2018 0000   PROT 8.0 11/01/2019 0856   ALBUMIN 4.2 11/01/2019 0856   AST 22 11/01/2019 0856   ALT 18 11/01/2019 0856   ALKPHOS 77 11/01/2019 0856   BILITOT 0.5 11/01/2019 0856   GFRNONAA 9 (L) 02/27/2018 0533   GFRAA 6 06/23/2018 0000     No results found.     Assessment & Plan:   1. ESRD (end stage renal disease) ([place]) Recommend:  The patient is doing well and currently has adequate dialysis access. The patient's dialysis center is not reporting any major access issues.  However, the flow rates in the patient's dialysis access are low, in the prethrombotic range, and there is no exact stenosis identified.  This raises  concerns that the access is at moderate but not high risk for a problem or thrombosis and should be followed more closely  The patient will follow-up with me in the office in 6 months without an HDA   2. Essential hypertension, benign Continue antihypertensive medications as already ordered, these medications have been reviewed and  there are no changes at this time.    Current Outpatient Medications on File Prior to Visit  Medication Sig Dispense Refill  . acetaminophen (TYLENOL) 500 MG tablet Take 1,000 mg by mouth every 6 (six) hours as needed for moderate pain or headache.    Marland Kitchen atorvastatin (LIPITOR) 20 MG tablet TAKE 1 TABLET BY MOUTH EVERY DAY 90 tablet 0  . Etelcalcetide HCl (PARSABIV IV) 1 mg 3 (three) times a week.    . furosemide (LASIX) 40 MG tablet Take 40 mg by mouth 2 (two) times daily.    . midodrine (PROAMATINE) 10 MG tablet Take by mouth.    . multivitamin (RENA-VIT) TABS tablet Take 1 tablet by mouth daily.    . Methoxy PEG-Epoetin Beta (MIRCERA IJ) Mircera (Patient not taking: No sig reported)    . Sodium Sulfate-Mag Sulfate-KCl (SUTAB) [phone] MG TABS At 5 PM take 12 tablets using the 8 oz cup provided in the kit drinking 5 cups of water and 5 hours before your procedure repeat the same process. (Patient not taking: No sig reported) 24 tablet 0  . VELPHORO 500 MG chewable tablet Chew 1,000 mg by mouth See admin instructions. Take 1000 mg with meals and 500 mg with snacks (Patient not taking: No sig reported)    . VITAMIN D, ERGOCALCIFEROL, PO Take by mouth. (Patient not taking: No sig reported)     No current facility-administered medications on file prior to visit.    There are no Patient Instructions on file for this visit. No follow-ups on file.   Kris Hartmann, NP

## 2020-06-25 DIAGNOSIS — D689 Coagulation defect, unspecified: Secondary | ICD-10-CM | POA: Diagnosis not present

## 2020-06-25 DIAGNOSIS — D509 Iron deficiency anemia, unspecified: Secondary | ICD-10-CM | POA: Diagnosis not present

## 2020-06-25 DIAGNOSIS — N2581 Secondary hyperparathyroidism of renal origin: Secondary | ICD-10-CM | POA: Diagnosis not present

## 2020-06-25 DIAGNOSIS — N186 End stage renal disease: Secondary | ICD-10-CM | POA: Diagnosis not present

## 2020-06-25 DIAGNOSIS — Z992 Dependence on renal dialysis: Secondary | ICD-10-CM | POA: Diagnosis not present

## 2020-06-26 ENCOUNTER — Other Ambulatory Visit: Payer: Self-pay | Admitting: Internal Medicine

## 2020-06-26 NOTE — Telephone Encounter (Signed)
Attempted to call patient to see if she was going to continue to see R Baity,NP or stay at [facility] office- left message will forward her Rx to them.

## 2020-06-27 ENCOUNTER — Ambulatory Visit: Payer: Medicare Other | Admitting: Gastroenterology

## 2020-06-27 DIAGNOSIS — Z992 Dependence on renal dialysis: Secondary | ICD-10-CM | POA: Diagnosis not present

## 2020-06-27 DIAGNOSIS — N186 End stage renal disease: Secondary | ICD-10-CM | POA: Diagnosis not present

## 2020-06-27 DIAGNOSIS — N2581 Secondary hyperparathyroidism of renal origin: Secondary | ICD-10-CM | POA: Diagnosis not present

## 2020-06-27 DIAGNOSIS — D689 Coagulation defect, unspecified: Secondary | ICD-10-CM | POA: Diagnosis not present

## 2020-06-27 DIAGNOSIS — D509 Iron deficiency anemia, unspecified: Secondary | ICD-10-CM | POA: Diagnosis not present

## 2020-06-28 NOTE — Telephone Encounter (Signed)
I don't see vit D on the labs.  I will need to defer to the PCP with the pending transfer.  I routed this in the meantime.  Thanks.

## 2020-06-28 NOTE — Telephone Encounter (Signed)
Dr Damita Dunnings, I wanted to follow up to see if you were deferring this to the new PCP patient will see on 08/01/20 or can we proceed with the Prolia injection and scheduling for our office before her appointment at the new office? Thank you

## 2020-06-29 DIAGNOSIS — N2581 Secondary hyperparathyroidism of renal origin: Secondary | ICD-10-CM | POA: Diagnosis not present

## 2020-06-29 DIAGNOSIS — Z992 Dependence on renal dialysis: Secondary | ICD-10-CM | POA: Diagnosis not present

## 2020-06-29 DIAGNOSIS — D689 Coagulation defect, unspecified: Secondary | ICD-10-CM | POA: Diagnosis not present

## 2020-06-29 DIAGNOSIS — N186 End stage renal disease: Secondary | ICD-10-CM | POA: Diagnosis not present

## 2020-06-29 DIAGNOSIS — D509 Iron deficiency anemia, unspecified: Secondary | ICD-10-CM | POA: Diagnosis not present

## 2020-07-01 NOTE — Telephone Encounter (Addendum)
Since I have never seen this patient I routed you this message as an FYI.  Since the transfer is pending, I hope this can be addressed on follow-up/establishing care with you

## 2020-07-02 DIAGNOSIS — D509 Iron deficiency anemia, unspecified: Secondary | ICD-10-CM | POA: Diagnosis not present

## 2020-07-02 DIAGNOSIS — Z992 Dependence on renal dialysis: Secondary | ICD-10-CM | POA: Diagnosis not present

## 2020-07-02 DIAGNOSIS — D689 Coagulation defect, unspecified: Secondary | ICD-10-CM | POA: Diagnosis not present

## 2020-07-02 DIAGNOSIS — N186 End stage renal disease: Secondary | ICD-10-CM | POA: Diagnosis not present

## 2020-07-02 DIAGNOSIS — N2581 Secondary hyperparathyroidism of renal origin: Secondary | ICD-10-CM | POA: Diagnosis not present

## 2020-07-02 DIAGNOSIS — I129 Hypertensive chronic kidney disease with stage 1 through stage 4 chronic kidney disease, or unspecified chronic kidney disease: Secondary | ICD-10-CM | POA: Diagnosis not present

## 2020-07-02 NOTE — Telephone Encounter (Signed)
Patient advised and she verbalized understanding

## 2020-07-03 ENCOUNTER — Other Ambulatory Visit: Payer: Self-pay

## 2020-07-03 ENCOUNTER — Ambulatory Visit: Payer: Medicare Other | Attending: Internal Medicine

## 2020-07-03 DIAGNOSIS — R2681 Unsteadiness on feet: Secondary | ICD-10-CM | POA: Diagnosis not present

## 2020-07-03 DIAGNOSIS — M6281 Muscle weakness (generalized): Secondary | ICD-10-CM | POA: Diagnosis not present

## 2020-07-03 DIAGNOSIS — M25642 Stiffness of left hand, not elsewhere classified: Secondary | ICD-10-CM | POA: Diagnosis not present

## 2020-07-03 NOTE — Therapy (Signed)
[place] MAIN [facility] SERVICES [address] [place], [place], [address] Phone: [phone]   Fax:  [phone]  Physical Therapy Treatment  Patient Details  Name: Kimberly Frost MRN: [id number] Date of Birth: August 31, 1950 Referring Provider (PT): Elmarie Shiley   Encounter Date: 07/03/2020   PT End of Session - 07/03/20 0815    Visit Number 22    Number of Visits 27    Date for PT Re-Evaluation 07/24/20    Authorization Type [employer] medicare    Authorization Time Period 2/10 PN 5/11    PT Start Time 0800    PT Stop Time 0844    PT Time Calculation (min) 44 min    Activity Tolerance Patient tolerated treatment well;Patient limited by fatigue    Behavior During Therapy [facility] for tasks assessed/performed           Past Medical History:  Diagnosis Date  . Chronic kidney disease   . Edema    RLE  . Hepatitis    Hep C  . History of blood transfusion   . Hypertension   . Wears dentures     Past Surgical History:  Procedure Laterality Date  . A/V FISTULAGRAM Left 04/11/2019   Procedure: A/V FISTULAGRAM;  Surgeon: Katha Cabal, MD;  Location: [place] [facility];  Service: Cardiovascular;  Laterality: Left;  . A/V FISTULAGRAM Left 05/01/2019   Procedure: A/V FISTULAGRAM;  Surgeon: Algernon Huxley, MD;  Location: [place] [facility];  Service: Cardiovascular;  Laterality: Left;  . A/V FISTULAGRAM Left 01/15/2020   Procedure: A/V FISTULAGRAM;  Surgeon: Algernon Huxley, MD;  Location: [facility];  Service: Cardiovascular;  Laterality: Left;  . A/V FISTULAGRAM Left 04/22/2020   Procedure: A/V FISTULAGRAM;  Surgeon: Algernon Huxley, MD;  Location: [facility];  Service: Cardiovascular;  Laterality: Left;  . AV FISTULA PLACEMENT Left 06/16/2018   Procedure: Left Arm ARTERIOVENOUS (AV) FISTULA CREATION;  Surgeon: Marty Heck, MD;  Location: [place];  Service: Vascular;  Laterality: Left;  Marland Kitchen MULTIPLE TOOTH EXTRACTIONS    . MYOMECTOMY       There were no vitals filed for this visit.   Subjective Assessment - 07/03/20 0814    Subjective Patient reports no falls or LOB since last session. Feels tired/cold after dialysis.    Pertinent History Patient has been having dialysis over a year and was having some problems with passing out due to BP regulation and was using a RW . Now she is not having the passing out symptoms. She has good days and bad days at the hospital. She is trying to get a kidney and the nurse thought she needed to have PT to have her use a RW. She is currently not using a RW. If she feels weak after dialysis she uses a scooter at the store if she needs it.    Currently in Pain? No/denies                 BP at start of session: 136/74    Standing by support bar: Standing with CGA next to support surface:  6" step: toe taps no UE support 20x each LE 6" step: lateral toe taps 15x each LE; 4" step up/down 10x each LE; cueing for sequencing due to confusion with altering LE's, rest break required after due to elevated HR.    In hallway: Rainbow ball: close CGA cues for maintaining base of support  -rainbow ball  vertical toss 2x 86 ft -rainbow ball bounce with ambulation 2x 86 ft -rainbow ball horizontal ball toss 2x47ft   Modified posterior lunge 10x each LE; more challenging with RLE Modified lateral squat 10x each side ; cues for gluteal activation and foot positioning  3 way hip hike with BUE support (forward, lateral, backwards); 10x each direction each LE cues for sequencing.     Seated:  Sit to stand throw rainbow ball 10x; very fatiguing to patient at this time  Seated hip add squeezes x 10 reps with 3 second holds with cues for muscle activation and ball placement Seated clamshells with GTB x 20x reps GTB hamstring curl against PT resistance 15x each LE   GTB ER/IR 15x each LE    Pt educated throughout session about proper posture and technique with exercises. Improved exercise  technique, movement at target joints, use of target muscles after min to mod verbal, visual, tactile cues.  Patient is very motivated throughout physical therapy session. Her right lower right extremity fatigues quicker than her left. She is fatigued by end of session requiring increased rest times. Patient will continue to benefit from skilled physical therapy to improve generalized strength, ROM, and capacity for functional activity.                   PT Education - 07/03/20 0815    Education Details exercise technique, body mechanics    Person(s) Educated Patient    Methods Explanation;Demonstration;Tactile cues;Verbal cues    Comprehension Verbalized understanding;Returned demonstration;Verbal cues required;Tactile cues required            PT Short Term Goals - 05/29/20 1660      PT SHORT TERM GOAL #1   Title Patient will be independent in home exercise program to improve strength/mobility for better functional independence with ADLs.    Baseline 4/27: unable to when she was having medication issues    Time 4    Period Weeks    Status Partially Met    Target Date 06/26/20      PT SHORT TERM GOAL #2   Title Patient will increase BLE gross strength to 4+/5 as to improve functional strength for independent gait, increased standing tolerance and increased ADL ability.    Baseline 01/26: 4/5 4/27: see note    Time 4    Period Weeks    Status Partially Met    Target Date 06/26/20      PT SHORT TERM GOAL #3   Title Patient will ascend/descend 4 stairs without rail assist independently without loss of balance to improve ability to get in/out of home.    Baseline 12/29: able to perform 4 stairs x3 with b/l rail today and no loss of balance    Time 8    Period Weeks    Status Achieved    Target Date 01/10/20             PT Long Term Goals - 05/29/20 0822      PT LONG TERM GOAL #1   Title Patient will increase six minute walk test distance to >1500 for  progression to age norm community ambulator and improve gait ability    Baseline 4/27: 960 ft with one seated rest break    Time 8    Period Weeks    Status New    Target Date 07/24/20      PT LONG TERM GOAL #2   Title Patient will ascend/descend 4 stairs without rail assist independently  without loss of balance to improve ability to get in/out of home.    Baseline see above    Time 8    Period Weeks    Status Achieved      PT LONG TERM GOAL #3   Title Patient will increase 10 meter walk test to >1.19m/s as to improve gait speed for better community ambulation and to reduce fall risk.    Baseline 12/13/19= .4m/sec, 01/26: 1.36 m/sec 4.27: >1.0    Time 8    Period Weeks    Status Achieved      PT LONG TERM GOAL #4   Title Patient (> 48 years old) will complete five times sit to stand test in < 15 seconds indicating an increased LE strength and improved balance.    Baseline 01/26: 18s 4/27: 13.8 seconds no UE support    Time 8    Period Weeks    Status Partially Met    Target Date 07/24/20      PT LONG TERM GOAL #5   Title Patient will increase ABC scale score >80% to demonstrate better functional mobility and better confidence with ADLs    Baseline 01/26: 76% 4/27: 55%    Time 8    Period Weeks    Status Partially Met    Target Date 07/24/20      Additional Long Term Goals   Additional Long Term Goals Yes      PT LONG TERM GOAL #6   Title Patient will increase FOTO score to equal to or greater than  85%   to demonstrate statistically significant improvement in mobility and quality of life.    Baseline 4/27: 61%    Time 8    Period Weeks    Status New    Target Date 07/24/20                 Plan - 07/03/20 7824    Clinical Impression Statement Patient is very motivated throughout physical therapy session. Her right lower right extremity fatigues quicker than her left. She is fatigued by end of session requiring increased rest times. Patient will continue to  benefit from skilled physical therapy to improve generalized strength, ROM, and capacity for functional activity.    Personal Factors and Comorbidities Comorbidity 1;Comorbidity 2;Comorbidity 3+    Comorbidities Patient has PMHx :Hyperparathyroidism, HTN Hep C: Anemia ESRD: She gets dialysis 3 x week. She follows with nephrology. She is currently waiting to be listed at [facility] for a kidney transplant.    Stability/Clinical Decision Making Stable/Uncomplicated    Rehab Potential Good    PT Frequency 1x / week    PT Duration 8 weeks    PT Treatment/Interventions Therapeutic exercise;Therapeutic activities;Balance training;Neuromuscular re-education;Stair training;Gait training;Energy conservation;Visual/perceptual remediation/compensation;Patient/family education    PT Next Visit Plan continue to progress strength and activity tolerance    PT Home Exercise Plan no updates this session    Consulted and Agree with Plan of Care Patient           Patient will benefit from skilled therapeutic intervention in order to improve the following deficits and impairments:  Abnormal gait,Decreased activity tolerance,Difficulty walking  Visit Diagnosis: Muscle weakness (generalized)  Gait instability  Stiffness of left hand, not elsewhere classified     Problem List Patient Active Problem List   Diagnosis Date Noted  . A-V fistula ([place]) 04/26/2019  . ESRD (end stage renal disease) ([place]) 10/26/2018  . Secondary hyperparathyroidism of renal origin ([place]) 09/06/2018  .  Anemia due to stage 5 chronic kidney disease ([place]) 02/26/2018  . Hepatitis C 02/15/2013  . Essential hypertension, benign 12/26/2008   Janna Arch, PT, DPT   07/03/2020, 8:49 AM  [place] MAIN [facility] SERVICES [address] [place], [place], [address] Phone: [phone]   Fax:  [phone]  Name: Kimberly Frost MRN: [id number] Date of Birth: [birth year]

## 2020-07-04 DIAGNOSIS — N2581 Secondary hyperparathyroidism of renal origin: Secondary | ICD-10-CM | POA: Diagnosis not present

## 2020-07-04 DIAGNOSIS — D689 Coagulation defect, unspecified: Secondary | ICD-10-CM | POA: Diagnosis not present

## 2020-07-04 DIAGNOSIS — Z992 Dependence on renal dialysis: Secondary | ICD-10-CM | POA: Diagnosis not present

## 2020-07-04 DIAGNOSIS — N186 End stage renal disease: Secondary | ICD-10-CM | POA: Diagnosis not present

## 2020-07-06 DIAGNOSIS — D689 Coagulation defect, unspecified: Secondary | ICD-10-CM | POA: Diagnosis not present

## 2020-07-06 DIAGNOSIS — N2581 Secondary hyperparathyroidism of renal origin: Secondary | ICD-10-CM | POA: Diagnosis not present

## 2020-07-06 DIAGNOSIS — Z992 Dependence on renal dialysis: Secondary | ICD-10-CM | POA: Diagnosis not present

## 2020-07-06 DIAGNOSIS — N186 End stage renal disease: Secondary | ICD-10-CM | POA: Diagnosis not present

## 2020-07-09 DIAGNOSIS — N186 End stage renal disease: Secondary | ICD-10-CM | POA: Diagnosis not present

## 2020-07-09 DIAGNOSIS — Z992 Dependence on renal dialysis: Secondary | ICD-10-CM | POA: Diagnosis not present

## 2020-07-09 DIAGNOSIS — D631 Anemia in chronic kidney disease: Secondary | ICD-10-CM | POA: Diagnosis not present

## 2020-07-09 DIAGNOSIS — D689 Coagulation defect, unspecified: Secondary | ICD-10-CM | POA: Diagnosis not present

## 2020-07-09 DIAGNOSIS — N2581 Secondary hyperparathyroidism of renal origin: Secondary | ICD-10-CM | POA: Diagnosis not present

## 2020-07-10 ENCOUNTER — Ambulatory Visit: Payer: Medicare Other

## 2020-07-10 ENCOUNTER — Other Ambulatory Visit: Payer: Self-pay

## 2020-07-10 DIAGNOSIS — R2681 Unsteadiness on feet: Secondary | ICD-10-CM | POA: Diagnosis not present

## 2020-07-10 DIAGNOSIS — M6281 Muscle weakness (generalized): Secondary | ICD-10-CM

## 2020-07-10 DIAGNOSIS — M25642 Stiffness of left hand, not elsewhere classified: Secondary | ICD-10-CM | POA: Diagnosis not present

## 2020-07-10 NOTE — Therapy (Signed)
[place] MAIN [facility] SERVICES [address] [place], [place], [address] Phone: [phone]   Fax:  [phone]  Physical Therapy Treatment  Patient Details  Name: Kimberly Frost MRN: [id number] Date of Birth: Aug 06, 1950 Referring Provider (PT): Elmarie Shiley   Encounter Date: 07/10/2020   PT End of Session - 07/10/20 0805    Visit Number 23    Number of Visits 27    Date for PT Re-Evaluation 07/24/20    Authorization Type UHC medicare    Authorization Time Period 3/10 PN 5/11    PT Start Time 0800    PT Stop Time 0843    PT Time Calculation (min) 43 min    Activity Tolerance Patient tolerated treatment well;Patient limited by fatigue    Behavior During Therapy [facility] for tasks assessed/performed           Past Medical History:  Diagnosis Date  . Chronic kidney disease   . Edema    RLE  . Hepatitis    Hep C  . History of blood transfusion   . Hypertension   . Wears dentures     Past Surgical History:  Procedure Laterality Date  . A/V FISTULAGRAM Left 04/11/2019   Procedure: A/V FISTULAGRAM;  Surgeon: Katha Cabal, MD;  Location: [place] CV LAB;  Service: Cardiovascular;  Laterality: Left;  . A/V FISTULAGRAM Left 05/01/2019   Procedure: A/V FISTULAGRAM;  Surgeon: Algernon Huxley, MD;  Location: [place] CV LAB;  Service: Cardiovascular;  Laterality: Left;  . A/V FISTULAGRAM Left 01/15/2020   Procedure: A/V FISTULAGRAM;  Surgeon: Algernon Huxley, MD;  Location: [place] CV LAB;  Service: Cardiovascular;  Laterality: Left;  . A/V FISTULAGRAM Left 04/22/2020   Procedure: A/V FISTULAGRAM;  Surgeon: Algernon Huxley, MD;  Location: [place] CV LAB;  Service: Cardiovascular;  Laterality: Left;  . AV FISTULA PLACEMENT Left 06/16/2018   Procedure: Left Arm ARTERIOVENOUS (AV) FISTULA CREATION;  Surgeon: Marty Heck, MD;  Location: [place];  Service: Vascular;  Laterality: Left;  Marland Kitchen MULTIPLE TOOTH EXTRACTIONS    . MYOMECTOMY       There were no vitals filed for this visit.   Subjective Assessment - 07/10/20 0804    Subjective Patient has not contacted the psychiatrist yet. No falls or LOB, has been going to dialysis.    Pertinent History Patient has been having dialysis over a year and was having some problems with passing out due to BP regulation and was using a RW . Now she is not having the passing out symptoms. She has good days and bad days at the hospital. She is trying to get a kidney and the nurse thought she needed to have PT to have her use a RW. She is currently not using a RW. If she feels weak after dialysis she uses a scooter at the store if she needs it.    Currently in Pain? No/denies               BP at start of session: 103/61    Standing by support bar: Standing with CGA next to support surface:   Standing with # 3lb  ankle weight: CGA for stability -Hip extension with bilateral upper extremity support, cueing for neutral hip alignment, upright posture for optimal muscle recruitment, and sequencing, 10x each LE,  -Hip abduction with bilateral upper extremity support, cueing for neutral foot alignment for correct muscle activation, 10x each LE -Hip flexion with bilateral  upper extremity support, cueing for body mechanics, speed of muscle recruitment for optimal strengthening and stabilization 10x each LE -Hamstring curl with bilateral upper extremity support, cueing for knee alignment for recruitment of hamstring musculature, 10x each LE  In hallway: Rainbow ball: close CGA cues for maintaining base of support  -rainbow ball vertical toss 2x 86 ft -rainbow ball bounce with ambulation 2x 86 ft -rainbow ball horizontal ball toss 2x70ft    Modified posterior lunge 10x each LE; more challenging with RLE   Seated:  Seated with #3lb ankle weights  -Seated marches with upright posture, back away from back of chair for abdominal/trunk activation/stabilization, 10x each LE -Seated LAQ with  3 second holds, 10x each LE, cueing for muscle activation and sequencing for neutral alignment -Seated IR/ER with cueing for stabilizing knee placement with lateral foot movement for optimal muscle recruitment, 10x each LE -seated heel raise 10x each LE   Sit to stand 10x no UE support  Seated clamshells with GTB x 20x reps GTB hamstring curl against PT resistance 15x each LE  GTB ER/IR 15x each LE Hamstring lengthening 60 seconds each LE     Pt educated throughout session about proper posture and technique with exercises. Improved exercise technique, movement at target joints, use of target muscles after min to mod verbal, visual, tactile cues.  Vitals monitored throughout physical therapy session.    Patient tolerates strengthening interventions well with occasional rest breaks due to fatigue. She is highly motivated throughout physical therapy session. She requires occasional cueing for body mechanics and sequencing for optimal muscle recruitment patterning. Patient will continue to benefit from skilled physical therapy to improve generalized strength, ROM, and capacity for functional activity.                          PT Education - 07/10/20 0805    Education Details exercise technique, body mechanics    Person(s) Educated Patient    Methods Explanation;Demonstration;Tactile cues;Verbal cues    Comprehension Verbalized understanding;Returned demonstration;Verbal cues required;Tactile cues required            PT Short Term Goals - 05/29/20 5093      PT SHORT TERM GOAL #1   Title Patient will be independent in home exercise program to improve strength/mobility for better functional independence with ADLs.    Baseline 4/27: unable to when she was having medication issues    Time 4    Period Weeks    Status Partially Met    Target Date 06/26/20      PT SHORT TERM GOAL #2   Title Patient will increase BLE gross strength to 4+/5 as to improve functional  strength for independent gait, increased standing tolerance and increased ADL ability.    Baseline 01/26: 4/5 4/27: see note    Time 4    Period Weeks    Status Partially Met    Target Date 06/26/20      PT SHORT TERM GOAL #3   Title Patient will ascend/descend 4 stairs without rail assist independently without loss of balance to improve ability to get in/out of home.    Baseline 12/29: able to perform 4 stairs x3 with b/l rail today and no loss of balance    Time 8    Period Weeks    Status Achieved    Target Date 01/10/20             PT Long Term Goals - 05/29/20 2671  PT LONG TERM GOAL #1   Title Patient will increase six minute walk test distance to >1500 for progression to age norm community ambulator and improve gait ability    Baseline 4/27: 960 ft with one seated rest break    Time 8    Period Weeks    Status New    Target Date 07/24/20      PT LONG TERM GOAL #2   Title Patient will ascend/descend 4 stairs without rail assist independently without loss of balance to improve ability to get in/out of home.    Baseline see above    Time 8    Period Weeks    Status Achieved      PT LONG TERM GOAL #3   Title Patient will increase 10 meter walk test to >1.12ms as to improve gait speed for better community ambulation and to reduce fall risk.    Baseline 12/13/19= .831mec, 01/26: 1.36 m/sec 4.27: >1.0    Time 8    Period Weeks    Status Achieved      PT LONG TERM GOAL #4   Title Patient (> 6080ears old) will complete five times sit to stand test in < 15 seconds indicating an increased LE strength and improved balance.    Baseline 01/26: 18s 4/27: 13.8 seconds no UE support    Time 8    Period Weeks    Status Partially Met    Target Date 07/24/20      PT LONG TERM GOAL #5   Title Patient will increase ABC scale score >80% to demonstrate better functional mobility and better confidence with ADLs    Baseline 01/26: 76% 4/27: 55%    Time 8    Period Weeks     Status Partially Met    Target Date 07/24/20      Additional Long Term Goals   Additional Long Term Goals Yes      PT LONG TERM GOAL #6   Title Patient will increase FOTO score to equal to or greater than  85%   to demonstrate statistically significant improvement in mobility and quality of life.    Baseline 4/27: 61%    Time 8    Period Weeks    Status New    Target Date 07/24/20                 Plan - 07/10/20 0813    Clinical Impression Statement Patient tolerates strengthening interventions well with occasional rest breaks due to fatigue. She is highly motivated throughout physical therapy session. She requires occasional cueing for body mechanics and sequencing for optimal muscle recruitment patterning. Patient will continue to benefit from skilled physical therapy to improve generalized strength, ROM, and capacity for functional activity.    Personal Factors and Comorbidities Comorbidity 1;Comorbidity 2;Comorbidity 3+    Comorbidities Patient has PMHx :Hyperparathyroidism, HTN Hep C: Anemia ESRD: She gets dialysis 3 x week. She follows with nephrology. [facility]    Stability/Clinical Decision Making Stable/Uncomplicated    Rehab Potential Good    PT Frequency 1x / week    PT Duration 8 weeks    PT Treatment/Interventions Therapeutic exercise;Therapeutic activities;Balance training;Neuromuscular re-education;Stair training;Gait training;Energy conservation;Visual/perceptual remediation/compensation;Patient/family education    PT Next Visit Plan continue to progress strength and activity tolerance    PT Home Exercise Plan no updates this session    Consulted and Agree with Plan of Care Patient  Patient will benefit from skilled therapeutic intervention in order to improve the following deficits and impairments:  Abnormal gait,Decreased activity tolerance,Difficulty walking  Visit  Diagnosis: Muscle weakness (generalized)  Gait instability     Problem List Patient Active Problem List   Diagnosis Date Noted  . A-V fistula ([place]) 04/26/2019  . ESRD (end stage renal disease) ([place]) 10/26/2018  . Secondary hyperparathyroidism of renal origin ([place]) 09/06/2018  . Anemia due to stage 5 chronic kidney disease ([place]) 02/26/2018  . Hepatitis C 02/15/2013  . Essential hypertension, benign 12/26/2008   Janna Arch, PT, DPT   07/10/2020, 8:44 AM  [place] MAIN [facility] SERVICES [address] [place], [place], [address] Phone: [phone]   Fax:  [phone]  Name: Kimberly Frost MRN: [id number] Date of Birth: [birth year]

## 2020-07-11 DIAGNOSIS — N186 End stage renal disease: Secondary | ICD-10-CM | POA: Diagnosis not present

## 2020-07-11 DIAGNOSIS — D631 Anemia in chronic kidney disease: Secondary | ICD-10-CM | POA: Diagnosis not present

## 2020-07-11 DIAGNOSIS — D689 Coagulation defect, unspecified: Secondary | ICD-10-CM | POA: Diagnosis not present

## 2020-07-11 DIAGNOSIS — Z992 Dependence on renal dialysis: Secondary | ICD-10-CM | POA: Diagnosis not present

## 2020-07-11 DIAGNOSIS — N2581 Secondary hyperparathyroidism of renal origin: Secondary | ICD-10-CM | POA: Diagnosis not present

## 2020-07-12 ENCOUNTER — Other Ambulatory Visit: Payer: Self-pay | Admitting: Internal Medicine

## 2020-07-12 NOTE — Telephone Encounter (Signed)
  Notes to clinic Not a provider we approve rx for, no upcoming appt in new practice.

## 2020-07-12 NOTE — Telephone Encounter (Signed)
   Notes to clinic Not a provider we approve rx for, no upcoming appt with new practice.

## 2020-07-13 DIAGNOSIS — D689 Coagulation defect, unspecified: Secondary | ICD-10-CM | POA: Diagnosis not present

## 2020-07-13 DIAGNOSIS — N2581 Secondary hyperparathyroidism of renal origin: Secondary | ICD-10-CM | POA: Diagnosis not present

## 2020-07-13 DIAGNOSIS — D631 Anemia in chronic kidney disease: Secondary | ICD-10-CM | POA: Diagnosis not present

## 2020-07-13 DIAGNOSIS — N186 End stage renal disease: Secondary | ICD-10-CM | POA: Diagnosis not present

## 2020-07-13 DIAGNOSIS — Z992 Dependence on renal dialysis: Secondary | ICD-10-CM | POA: Diagnosis not present

## 2020-07-16 DIAGNOSIS — Z992 Dependence on renal dialysis: Secondary | ICD-10-CM | POA: Diagnosis not present

## 2020-07-16 DIAGNOSIS — D689 Coagulation defect, unspecified: Secondary | ICD-10-CM | POA: Diagnosis not present

## 2020-07-16 DIAGNOSIS — N186 End stage renal disease: Secondary | ICD-10-CM | POA: Diagnosis not present

## 2020-07-16 DIAGNOSIS — N2581 Secondary hyperparathyroidism of renal origin: Secondary | ICD-10-CM | POA: Diagnosis not present

## 2020-07-17 ENCOUNTER — Ambulatory Visit: Payer: Medicare Other

## 2020-07-18 DIAGNOSIS — D689 Coagulation defect, unspecified: Secondary | ICD-10-CM | POA: Diagnosis not present

## 2020-07-18 DIAGNOSIS — Z992 Dependence on renal dialysis: Secondary | ICD-10-CM | POA: Diagnosis not present

## 2020-07-18 DIAGNOSIS — N2581 Secondary hyperparathyroidism of renal origin: Secondary | ICD-10-CM | POA: Diagnosis not present

## 2020-07-18 DIAGNOSIS — N186 End stage renal disease: Secondary | ICD-10-CM | POA: Diagnosis not present

## 2020-07-20 DIAGNOSIS — N186 End stage renal disease: Secondary | ICD-10-CM | POA: Diagnosis not present

## 2020-07-20 DIAGNOSIS — N2581 Secondary hyperparathyroidism of renal origin: Secondary | ICD-10-CM | POA: Diagnosis not present

## 2020-07-20 DIAGNOSIS — Z992 Dependence on renal dialysis: Secondary | ICD-10-CM | POA: Diagnosis not present

## 2020-07-20 DIAGNOSIS — D689 Coagulation defect, unspecified: Secondary | ICD-10-CM | POA: Diagnosis not present

## 2020-07-23 DIAGNOSIS — Z992 Dependence on renal dialysis: Secondary | ICD-10-CM | POA: Diagnosis not present

## 2020-07-23 DIAGNOSIS — N2581 Secondary hyperparathyroidism of renal origin: Secondary | ICD-10-CM | POA: Diagnosis not present

## 2020-07-23 DIAGNOSIS — N186 End stage renal disease: Secondary | ICD-10-CM | POA: Diagnosis not present

## 2020-07-23 DIAGNOSIS — D689 Coagulation defect, unspecified: Secondary | ICD-10-CM | POA: Diagnosis not present

## 2020-07-24 ENCOUNTER — Ambulatory Visit: Payer: Medicare Other

## 2020-07-25 DIAGNOSIS — D689 Coagulation defect, unspecified: Secondary | ICD-10-CM | POA: Diagnosis not present

## 2020-07-25 DIAGNOSIS — N2581 Secondary hyperparathyroidism of renal origin: Secondary | ICD-10-CM | POA: Diagnosis not present

## 2020-07-25 DIAGNOSIS — N186 End stage renal disease: Secondary | ICD-10-CM | POA: Diagnosis not present

## 2020-07-25 DIAGNOSIS — Z992 Dependence on renal dialysis: Secondary | ICD-10-CM | POA: Diagnosis not present

## 2020-07-27 DIAGNOSIS — D689 Coagulation defect, unspecified: Secondary | ICD-10-CM | POA: Diagnosis not present

## 2020-07-27 DIAGNOSIS — Z992 Dependence on renal dialysis: Secondary | ICD-10-CM | POA: Diagnosis not present

## 2020-07-27 DIAGNOSIS — N2581 Secondary hyperparathyroidism of renal origin: Secondary | ICD-10-CM | POA: Diagnosis not present

## 2020-07-27 DIAGNOSIS — N186 End stage renal disease: Secondary | ICD-10-CM | POA: Diagnosis not present

## 2020-07-30 DIAGNOSIS — D689 Coagulation defect, unspecified: Secondary | ICD-10-CM | POA: Diagnosis not present

## 2020-07-30 DIAGNOSIS — N186 End stage renal disease: Secondary | ICD-10-CM | POA: Diagnosis not present

## 2020-07-30 DIAGNOSIS — N2581 Secondary hyperparathyroidism of renal origin: Secondary | ICD-10-CM | POA: Diagnosis not present

## 2020-07-30 DIAGNOSIS — Z992 Dependence on renal dialysis: Secondary | ICD-10-CM | POA: Diagnosis not present

## 2020-07-31 ENCOUNTER — Other Ambulatory Visit: Payer: Self-pay

## 2020-07-31 ENCOUNTER — Ambulatory Visit: Payer: Medicare Other

## 2020-07-31 DIAGNOSIS — R2681 Unsteadiness on feet: Secondary | ICD-10-CM | POA: Diagnosis not present

## 2020-07-31 DIAGNOSIS — M6281 Muscle weakness (generalized): Secondary | ICD-10-CM | POA: Diagnosis not present

## 2020-07-31 DIAGNOSIS — M25642 Stiffness of left hand, not elsewhere classified: Secondary | ICD-10-CM | POA: Diagnosis not present

## 2020-07-31 NOTE — Therapy (Signed)
[place] MAIN [facility] SERVICES [address] [place], [place], [address] Phone: [phone]   Fax:  [phone]  Physical Therapy Treatment/RECERT  Patient Details  Name: Kimberly Frost MRN: [id number] Date of Birth: Mar 30, 1950 Referring Provider (PT): Elmarie Shiley   Encounter Date: 07/31/2020   PT End of Session - 07/31/20 1022     Visit Number 24    Number of Visits 40    Date for PT Re-Evaluation 09/25/20    Authorization Type UHC medicare    Authorization Time Period 4/10 PN 5/11    PT Start Time 1005    PT Stop Time 1052    PT Time Calculation (min) 47 min    Activity Tolerance Patient tolerated treatment well;Patient limited by fatigue    Behavior During Therapy [facility] for tasks assessed/performed             Past Medical History:  Diagnosis Date   Chronic kidney disease    Edema    RLE   Hepatitis    Hep C   History of blood transfusion    Hypertension    Wears dentures     Past Surgical History:  Procedure Laterality Date   A/V FISTULAGRAM Left 04/11/2019   Procedure: A/V FISTULAGRAM;  Surgeon: Katha Cabal, MD;  Location: [place] CV LAB;  Service: Cardiovascular;  Laterality: Left;   A/V FISTULAGRAM Left 05/01/2019   Procedure: A/V FISTULAGRAM;  Surgeon: Algernon Huxley, MD;  Location: [place] CV LAB;  Service: Cardiovascular;  Laterality: Left;   A/V FISTULAGRAM Left 01/15/2020   Procedure: A/V FISTULAGRAM;  Surgeon: Algernon Huxley, MD;  Location: [place] CV LAB;  Service: Cardiovascular;  Laterality: Left;   A/V FISTULAGRAM Left 04/22/2020   Procedure: A/V FISTULAGRAM;  Surgeon: Algernon Huxley, MD;  Location: [place] CV LAB;  Service: Cardiovascular;  Laterality: Left;   AV FISTULA PLACEMENT Left 06/16/2018   Procedure: Left Arm ARTERIOVENOUS (AV) FISTULA CREATION;  Surgeon: Marty Heck, MD;  Location: [place];  Service: Vascular;  Laterality: Left;   MULTIPLE TOOTH EXTRACTIONS      MYOMECTOMY      There were no vitals filed for this visit.   Subjective Assessment - 07/31/20 1020     Subjective Patient reports her blood pressure medication has been changed, her hemoglobin has been low and had to get iron injections during dialysis.    Pertinent History Patient has been having dialysis over a year and was having some problems with passing out due to BP regulation and was using a RW . Now she is not having the passing out symptoms. She has good days and bad days at the hospital. She is trying to get a kidney and the nurse thought she needed to have PT to have her use a RW. She is currently not using a RW. If she feels weak after dialysis she uses a scooter at the store if she needs it.    Currently in Pain? No/denies                    BP  Seated: 107/60  Standing: 76/50  Patient reports her blood pressure medication has been changed, her hemoglobin has been low and had to get iron injections during dialysis.   RECERT Goals: HEP: compliant BLE strength grossly 4/5  6 min walk test-deferred due to orthostatic hypotension ABC: 89%  FOTO: 72%   Treatment: Seated interventions only due to orthostatic hypotension  issues this morning.   Seated with # 4lb ankle weights  -Seated marches with upright posture, back away from back of chair for abdominal/trunk activation/stabilization, 10x each LE -Seated LAQ with 3 second holds, 10x each LE, cueing for muscle activation and sequencing for neutral alignment -Seated IR/ER with cueing for stabilizing knee placement with lateral foot movement for optimal muscle recruitment, 10x each LE -heel toe raises 15x   Seated: -adduction ball squeeze 12x -adduction with LAQ squeezing green ball between feet  -RTB around bilateral knees 20x  Ambulate with PT up to front of hospital, car transfer, multiple sways from lightheadedness with lowered blood pressure. X690 ft   Patient has met ABC goal.  Multiple standing goals  deferred this session due to orthostatic hypotension from patients new change in medication. Will address them next session. Patient has had great improvements in functional mobility and stability however today does not reflect this as she is having multiple issues with her blood pressure. In previous sessions she has had significant gains with ambulation as can be seen in her progress note. Patient will continue to benefit from skilled physical therapy to improve generalized strength, ROM, and capacity for functional activity.               PT Education - 07/31/20 1021     Education Details goals, POC    Person(s) Educated Patient    Methods Explanation;Demonstration;Tactile cues;Verbal cues    Comprehension Verbalized understanding;Returned demonstration;Verbal cues required;Tactile cues required              PT Short Term Goals - 07/31/20 1057       PT SHORT TERM GOAL #1   Title Patient will be independent in home exercise program to improve strength/mobility for better functional independence with ADLs.    Baseline 4/27: unable to when she was having medication issues 6/29: hep compliant    Time 4    Period Weeks    Status Achieved    Target Date 06/26/20      PT SHORT TERM GOAL #2   Title Patient will increase BLE gross strength to 4+/5 as to improve functional strength for independent gait, increased standing tolerance and increased ADL ability.    Baseline 01/26: 4/5 4/27: see note 6/29: grossly 4/5    Time 4    Period Weeks    Status Partially Met    Target Date 08/28/20      PT SHORT TERM GOAL #3   Title Patient will ascend/descend 4 stairs without rail assist independently without loss of balance to improve ability to get in/out of home.    Baseline 12/29: able to perform 4 stairs x3 with b/l rail today and no loss of balance    Time 8    Period Weeks    Status Achieved    Target Date 01/10/20               PT Long Term Goals - 07/31/20 1057        PT LONG TERM GOAL #1   Title Patient will increase six minute walk test distance to >1500 for progression to age norm community ambulator and improve gait ability    Baseline 4/27: 960 ft with one seated rest break 6/29: deferred due to orthostatic hypotension    Time 8    Period Weeks    Status On-going    Target Date 09/25/20      PT LONG TERM GOAL #2   Title Patient will  ascend/descend 4 stairs without rail assist independently without loss of balance to improve ability to get in/out of home.    Baseline see above 6/29:    Time 8    Period Weeks    Status Achieved      PT LONG TERM GOAL #3   Title Patient will increase 10 meter walk test to >1.53ms as to improve gait speed for better community ambulation and to reduce fall risk.    Baseline 12/13/19= .859mec, 01/26: 1.36 m/sec 4.27: >1.0    Time 8    Period Weeks    Status Achieved      PT LONG TERM GOAL #4   Title Patient (> 6033ears old) will complete five times sit to stand test in < 15 seconds indicating an increased LE strength and improved balance.    Baseline 01/26: 18s 4/27: 13.8 seconds no UE support    Time 8    Period Weeks    Status Achieved      PT LONG TERM GOAL #5   Title Patient will increase ABC scale score >80% to demonstrate better functional mobility and better confidence with ADLs    Baseline 01/26: 76% 4/27: 55% 6/29 6/29: 89%    Time 8    Period Weeks    Status Achieved      Additional Long Term Goals   Additional Long Term Goals Yes      PT LONG TERM GOAL #6   Title Patient will increase FOTO score to equal to or greater than  85%   to demonstrate statistically significant improvement in mobility and quality of life.    Baseline 4/27: 61% 6/29: 72%    Time 8    Period Weeks    Status Partially Met    Target Date 09/25/20      PT LONG TERM GOAL #7   Title Patient will increase dynamic gait index score to >19/24 as to demonstrate reduced fall risk and improved dynamic gait balance for  better safety with community/home ambulation.    Baseline 6/29: perform next session    Time 8    Period Weeks    Status New    Target Date 09/25/20                   Plan - 07/31/20 1102     Clinical Impression Statement Patient has met ABC goal.  Multiple standing goals deferred this session due to orthostatic hypotension from patients new change in medication. Will address them next session. Patient has had great improvements in functional mobility and stability however today does not reflect this as she is having multiple issues with her blood pressure. In previous sessions she has had significant gains with ambulation as can be seen in her progress note. Patient will continue to benefit from skilled physical therapy to improve generalized strength, ROM, and capacity for functional activity.    Personal Factors and Comorbidities Comorbidity 1;Comorbidity 2;Comorbidity 3+    Comorbidities Patient has PMHx :Hyperparathyroidism, HTN Hep C: Anemia ESRD: She gets dialysis 3 x week. She follows with nephrology. [facility]    Examination-Activity Limitations Bed Mobility;Carry;Caring for Others;Toileting;Stand;Stairs;Locomotion Level;Squat;Dressing;Sit;Transfers    Examination-Participation Restrictions Cleaning;Community Activity;Driving;Meal Prep;Medication Management;Laundry;Interpersonal Relationship;Occupation;Personal Finances;Shop;Yard Work;Volunteer    Stability/Clinical Decision Making Stable/Uncomplicated    Rehab Potential Good    PT Frequency 1x / week    PT Duration 8 weeks    PT Treatment/Interventions  Therapeutic exercise;Therapeutic activities;Balance training;Neuromuscular re-education;Stair training;Gait training;Energy conservation;Visual/perceptual remediation/compensation;Patient/family education;ADLs/Self Care Home Management;Canalith Repostioning;Cryotherapy;Electrical  Stimulation;Biofeedback;Iontophoresis 36m/ml Dexamethasone;Moist Heat;Ultrasound;Passive range of motion;Dry needling;Splinting;Taping;Vestibular    PT Next Visit Plan DGI, 6 min walk test, 5x STS    PT Home Exercise Plan no updates this session    Consulted and Agree with Plan of Care Patient             Patient will benefit from skilled therapeutic intervention in order to improve the following deficits and impairments:  Abnormal gait, Decreased activity tolerance, Difficulty walking, Cardiopulmonary status limiting activity, Decreased balance, Decreased mobility, Decreased strength, Dizziness, Impaired perceived functional ability, Improper body mechanics, Impaired sensation  Visit Diagnosis: Muscle weakness (generalized)  Gait instability     Problem List Patient Active Problem List   Diagnosis Date Noted   [place]-V fistula ([place] 04/26/2019   ES[place] ([place] renal disease) ([place] 10/26/2018   Secondary hyperparathyroidism of renal origin ([place] 09/06/2018   Anemia due to stage 5 chronic kidney disease ([place] 02/26/2018   Hepatitis C 02/15/2013   Essential hypertension, benign 12/26/2008    MJanna Arch PT, DPT  07/31/2020, 11:05 AM  [address] [address]Phone: [phone]  Fax:  [phone] Name: JELLSIE VIOLETTEMRN: [id number] of Birth: 1Feb 10, 1952

## 2020-08-01 ENCOUNTER — Encounter: Payer: Medicare Other | Admitting: Adult Health

## 2020-08-01 DIAGNOSIS — N186 End stage renal disease: Secondary | ICD-10-CM | POA: Diagnosis not present

## 2020-08-01 DIAGNOSIS — Z992 Dependence on renal dialysis: Secondary | ICD-10-CM | POA: Diagnosis not present

## 2020-08-01 DIAGNOSIS — I129 Hypertensive chronic kidney disease with stage 1 through stage 4 chronic kidney disease, or unspecified chronic kidney disease: Secondary | ICD-10-CM | POA: Diagnosis not present

## 2020-08-01 DIAGNOSIS — D689 Coagulation defect, unspecified: Secondary | ICD-10-CM | POA: Diagnosis not present

## 2020-08-01 DIAGNOSIS — N2581 Secondary hyperparathyroidism of renal origin: Secondary | ICD-10-CM | POA: Diagnosis not present

## 2020-08-03 DIAGNOSIS — D631 Anemia in chronic kidney disease: Secondary | ICD-10-CM | POA: Diagnosis not present

## 2020-08-03 DIAGNOSIS — Z992 Dependence on renal dialysis: Secondary | ICD-10-CM | POA: Diagnosis not present

## 2020-08-03 DIAGNOSIS — N2581 Secondary hyperparathyroidism of renal origin: Secondary | ICD-10-CM | POA: Diagnosis not present

## 2020-08-03 DIAGNOSIS — N186 End stage renal disease: Secondary | ICD-10-CM | POA: Diagnosis not present

## 2020-08-03 DIAGNOSIS — D689 Coagulation defect, unspecified: Secondary | ICD-10-CM | POA: Diagnosis not present

## 2020-08-06 DIAGNOSIS — Z992 Dependence on renal dialysis: Secondary | ICD-10-CM | POA: Diagnosis not present

## 2020-08-06 DIAGNOSIS — D689 Coagulation defect, unspecified: Secondary | ICD-10-CM | POA: Diagnosis not present

## 2020-08-06 DIAGNOSIS — D631 Anemia in chronic kidney disease: Secondary | ICD-10-CM | POA: Diagnosis not present

## 2020-08-06 DIAGNOSIS — N186 End stage renal disease: Secondary | ICD-10-CM | POA: Diagnosis not present

## 2020-08-06 DIAGNOSIS — N2581 Secondary hyperparathyroidism of renal origin: Secondary | ICD-10-CM | POA: Diagnosis not present

## 2020-08-07 ENCOUNTER — Ambulatory Visit: Payer: Medicare Other | Attending: Internal Medicine

## 2020-08-07 ENCOUNTER — Encounter (HOSPITAL_COMMUNITY): Payer: Self-pay

## 2020-08-07 ENCOUNTER — Ambulatory Visit: Payer: Medicare Other | Admitting: Gastroenterology

## 2020-08-07 ENCOUNTER — Other Ambulatory Visit: Payer: Self-pay

## 2020-08-07 DIAGNOSIS — R2681 Unsteadiness on feet: Secondary | ICD-10-CM | POA: Diagnosis not present

## 2020-08-07 DIAGNOSIS — R2689 Other abnormalities of gait and mobility: Secondary | ICD-10-CM

## 2020-08-07 DIAGNOSIS — M6281 Muscle weakness (generalized): Secondary | ICD-10-CM | POA: Insufficient documentation

## 2020-08-07 NOTE — Therapy (Signed)
[place] MAIN [facility] SERVICES [address] [place], [place], [address] Phone: [phone]   Fax:  [phone]  Physical Therapy Treatment  Patient Details  Name: Kimberly Frost MRN: [id number] Date of Birth: [birth year] Referring Provider (PT): Elmarie Shiley   Encounter Date: 08/07/2020   PT End of Session - 08/07/20 1546     Visit Number 25    Number of Visits 40    Date for PT Re-Evaluation 09/25/20    Authorization Type [employer] medicare    Authorization Time Period 4/10 PN 5/11    PT Start Time 0851    PT Stop Time 0930    PT Time Calculation (min) 39 min    Equipment Utilized During Treatment Gait belt    Activity Tolerance Patient tolerated treatment well;Patient limited by fatigue    Behavior During Therapy [facility] for tasks assessed/performed             Past Medical History:  Diagnosis Date   Chronic kidney disease    Edema    RLE   Hepatitis    Hep C   History of blood transfusion    Hypertension    Wears dentures     Past Surgical History:  Procedure Laterality Date   A/V FISTULAGRAM Left 04/11/2019   Procedure: A/V FISTULAGRAM;  Surgeon: Katha Cabal, MD;  Location: [place] [facility];  Service: Cardiovascular;  Laterality: Left;   A/V FISTULAGRAM Left 05/01/2019   Procedure: A/V FISTULAGRAM;  Surgeon: Algernon Huxley, MD;  Location: [place] [facility];  Service: Cardiovascular;  Laterality: Left;   A/V FISTULAGRAM Left 01/15/2020   Procedure: A/V FISTULAGRAM;  Surgeon: Algernon Huxley, MD;  Location: [place] [facility];  Service: Cardiovascular;  Laterality: Left;   A/V FISTULAGRAM Left 04/22/2020   Procedure: A/V FISTULAGRAM;  Surgeon: Algernon Huxley, MD;  Location: [place] [facility];  Service: Cardiovascular;  Laterality: Left;   AV FISTULA PLACEMENT Left 06/16/2018   Procedure: Left Arm ARTERIOVENOUS (AV) FISTULA CREATION;  Surgeon: Marty Heck, MD;  Location: [place];  Service: Vascular;  Laterality: Left;    MULTIPLE TOOTH EXTRACTIONS     MYOMECTOMY      There were no vitals filed for this visit.   Subjective Assessment - 08/07/20 0852     Subjective Pt feels tired and weak today. Pt reports no pain today.    Pertinent History Patient has been having dialysis over a year and was having some problems with passing out due to BP regulation and was using a RW . Now she is not having the passing out symptoms. She has good days and bad days at the hospital. She is trying to get a kidney and the nurse thought she needed to have PT to have her use a RW. She is currently not using a RW. If she feels weak after dialysis she uses a scooter at the store if she needs it.    Currently in Pain? No/denies             TREATMENT-  Seated RUE BP: 122/62 mmHg, HR 102 bpm Standing RUE BP: 81/44 mmHg, HR 101 bpm Pt reports feeling light-headed upon standing.     Treatment: Seated interventions continued due to orthostatic hypotension issues this morning.    Pt in sitting with # 3lb ankle weights on each LE: -Seated marches, cuing for upright posture, back away from back of chair for abdominal/trunk activation/stabilization - 2x12 each LE; pt  rates medium -Seated LAQ - 2x12 each LE, cueing to decrease speed at which reps are performed/for full ROM. With 3# AW still on each LE pt performs seated hip IR/ER with use of red ball between knees to facilitate correct technique/optimal muscle recruitment - 2x12 each LE for each exercise.  -Seated heel toe raises - 2x15 - Seated adduction ball squeeze - 2x12  5xSTS: pt completes in 14.33 sec (previously 13.8 sec), likely impacted secondary to fatigue following dialysis. PT provides close CGA.   BP at end of appointment, taken on RUE: seated: 102/59 mmHg Standing: 64/48 HR 113 bpm  PT ambulates with pt to front desk where pt taken back to her car in transport chair for safety.     PT Education - 08/07/20 1545     Education Details indications of goal that  was reassessed, exercise technique, body mechanics    Person(s) Educated Patient    Methods Explanation;Demonstration;Verbal cues    Comprehension Verbalized understanding;Returned demonstration              PT Short Term Goals - 07/31/20 1057       PT SHORT TERM GOAL #1   Title Patient will be independent in home exercise program to improve strength/mobility for better functional independence with ADLs.    Baseline 4/27: unable to when she was having medication issues 6/29: hep compliant    Time 4    Period Weeks    Status Achieved    Target Date 06/26/20      PT SHORT TERM GOAL #2   Title Patient will increase BLE gross strength to 4+/5 as to improve functional strength for independent gait, increased standing tolerance and increased ADL ability.    Baseline 01/26: 4/5 4/27: see note 6/29: grossly 4/5    Time 4    Period Weeks    Status Partially Met    Target Date 08/28/20      PT SHORT TERM GOAL #3   Title Patient will ascend/descend 4 stairs without rail assist independently without loss of balance to improve ability to get in/out of home.    Baseline 12/29: able to perform 4 stairs x3 with b/l rail today and no loss of balance    Time 8    Period Weeks    Status Achieved    Target Date 01/10/20               PT Long Term Goals - 08/07/20 1552       PT LONG TERM GOAL #1   Title Patient will increase six minute walk test distance to >1500 for progression to age norm community ambulator and improve gait ability    Baseline 4/27: 960 ft with one seated rest break 6/29: deferred due to orthostatic hypotension    Time 8    Period Weeks    Status On-going    Target Date 09/25/20      PT LONG TERM GOAL #2   Title Patient will ascend/descend 4 stairs without rail assist independently without loss of balance to improve ability to get in/out of home.    Baseline see above 6/29:    Time 8    Period Weeks    Status Achieved      PT LONG TERM GOAL #3   Title  Patient will increase 10 meter walk test to >1.62m/s as to improve gait speed for better community ambulation and to reduce fall risk.    Baseline 12/13/19= .67m/sec, 01/26: 1.36 m/sec 4.27: >  1.0    Time 8    Period Weeks    Status Achieved      PT LONG TERM GOAL #4   Title Patient (> 96 years old) will complete five times sit to stand test in < 15 seconds indicating an increased LE strength and improved balance.    Baseline 01/26: 18s 4/27: 13.8 seconds no UE support    Time 8    Period Weeks    Status Achieved      PT LONG TERM GOAL #5   Title Patient will increase ABC scale score >80% to demonstrate better functional mobility and better confidence with ADLs    Baseline 01/26: 76% 4/27: 55% 6/29 6/29: 89%    Time 8    Period Weeks    Status Achieved      PT LONG TERM GOAL #6   Title Patient will increase FOTO score to equal to or greater than  85%   to demonstrate statistically significant improvement in mobility and quality of life.    Baseline 4/27: 61% 6/29: 72%    Time 8    Period Weeks    Status Partially Met    Target Date 09/25/20      PT LONG TERM GOAL #7   Title Patient will increase dynamic gait index score to >19/24 as to demonstrate reduced fall risk and improved dynamic gait balance for better safety with community/home ambulation.    Baseline 6/29: perform next session; 7/6: deferred d/t pt BP and dizziness    Time 8    Period Weeks    Status New    Target Date 09/25/20               Plan - 08/07/20 1559     Clinical Impression Statement Therex largely limited to seated exercises d/t pt continued difficuties with orthostatic hypotension. Pt did progress on volume of seated therex performed today without reports of increased sx. Pt completed 5xSTS with close CGA in 14.33 sec. Pt score likely impacted secondary to fatigue/orthostatics. At end of session, PT ambulated with pt to front desk where pt was taken back to her car in transport chair for safety, as pt  reports LEs feeling weak. The pt will benefit from further skilled PT to improve generalized strength, ROM and capacity for functional activity.    Personal Factors and Comorbidities Comorbidity 1;Comorbidity 2;Comorbidity 3+    Comorbidities Patient has PMHx :Hyperparathyroidism, HTN Hep C: Anemia ESRD: She gets dialysis 3 x week. She follows with nephrology. She is currently waiting to be listed at [facility] for a kidney transplant.    Examination-Activity Limitations Bed Mobility;Carry;Caring for Others;Toileting;Stand;Stairs;Locomotion Level;Squat;Dressing;Sit;Transfers    Examination-Participation Restrictions Cleaning;Community Activity;Driving;Meal Prep;Medication Management;Laundry;Interpersonal Relationship;Occupation;Personal Finances;Shop;Yard Work;Volunteer    Stability/Clinical Decision Making Stable/Uncomplicated    Rehab Potential Good    PT Frequency 1x / week    PT Duration 8 weeks    PT Treatment/Interventions Therapeutic exercise;Therapeutic activities;Balance training;Neuromuscular re-education;Stair training;Gait training;Energy conservation;Visual/perceptual remediation/compensation;Patient/family education;ADLs/Self Care Home Management;Canalith Repostioning;Cryotherapy;Electrical Stimulation;Biofeedback;Iontophoresis 33m/ml Dexamethasone;Moist Heat;Ultrasound;Passive range of motion;Dry needling;Splinting;Taping;Vestibular    PT Next Visit Plan DGI, 6 min walk test, 5x STS    PT Home Exercise Plan no updates this session    Consulted and Agree with Plan of Care Patient             Patient will benefit from skilled therapeutic intervention in order to improve the following deficits and impairments:  Abnormal gait, Decreased activity tolerance, Difficulty walking, Cardiopulmonary status limiting activity,  Decreased balance, Decreased mobility, Decreased strength, Dizziness, Impaired perceived functional ability, Improper body mechanics, Impaired sensation  Visit  Diagnosis: Muscle weakness (generalized)  Unsteadiness on feet  Other abnormalities of gait and mobility     Problem List Patient Active Problem List   Diagnosis Date Noted   A-V fistula ([place]) 04/26/2019   ESRD (end stage renal disease) ([place]) 10/26/2018   Secondary hyperparathyroidism of renal origin ([place]) 09/06/2018   Anemia due to stage 5 chronic kidney disease ([place]) 02/26/2018   Hepatitis C 02/15/2013   Essential hypertension, benign 12/26/2008   Ricard Dillon PT, DPT 08/07/2020, 4:03 PM  [place] MAIN [facility] SERVICES [address] [place], [place], [address] Phone: [phone]   Fax:  [phone]  Name: Kimberly Frost MRN: [id number] Date of Birth: August 22, 1950

## 2020-08-08 DIAGNOSIS — D631 Anemia in chronic kidney disease: Secondary | ICD-10-CM | POA: Diagnosis not present

## 2020-08-08 DIAGNOSIS — N186 End stage renal disease: Secondary | ICD-10-CM | POA: Diagnosis not present

## 2020-08-08 DIAGNOSIS — D689 Coagulation defect, unspecified: Secondary | ICD-10-CM | POA: Diagnosis not present

## 2020-08-08 DIAGNOSIS — Z992 Dependence on renal dialysis: Secondary | ICD-10-CM | POA: Diagnosis not present

## 2020-08-08 DIAGNOSIS — N2581 Secondary hyperparathyroidism of renal origin: Secondary | ICD-10-CM | POA: Diagnosis not present

## 2020-08-10 DIAGNOSIS — N2581 Secondary hyperparathyroidism of renal origin: Secondary | ICD-10-CM | POA: Diagnosis not present

## 2020-08-10 DIAGNOSIS — D689 Coagulation defect, unspecified: Secondary | ICD-10-CM | POA: Diagnosis not present

## 2020-08-10 DIAGNOSIS — D631 Anemia in chronic kidney disease: Secondary | ICD-10-CM | POA: Diagnosis not present

## 2020-08-10 DIAGNOSIS — N186 End stage renal disease: Secondary | ICD-10-CM | POA: Diagnosis not present

## 2020-08-10 DIAGNOSIS — Z992 Dependence on renal dialysis: Secondary | ICD-10-CM | POA: Diagnosis not present

## 2020-08-13 DIAGNOSIS — D689 Coagulation defect, unspecified: Secondary | ICD-10-CM | POA: Diagnosis not present

## 2020-08-13 DIAGNOSIS — D631 Anemia in chronic kidney disease: Secondary | ICD-10-CM | POA: Diagnosis not present

## 2020-08-13 DIAGNOSIS — N186 End stage renal disease: Secondary | ICD-10-CM | POA: Diagnosis not present

## 2020-08-13 DIAGNOSIS — Z992 Dependence on renal dialysis: Secondary | ICD-10-CM | POA: Diagnosis not present

## 2020-08-13 DIAGNOSIS — N2581 Secondary hyperparathyroidism of renal origin: Secondary | ICD-10-CM | POA: Diagnosis not present

## 2020-08-14 ENCOUNTER — Ambulatory Visit: Payer: Medicare Other

## 2020-08-14 ENCOUNTER — Other Ambulatory Visit: Payer: Self-pay

## 2020-08-14 DIAGNOSIS — M6281 Muscle weakness (generalized): Secondary | ICD-10-CM | POA: Diagnosis not present

## 2020-08-14 DIAGNOSIS — R2689 Other abnormalities of gait and mobility: Secondary | ICD-10-CM | POA: Diagnosis not present

## 2020-08-14 DIAGNOSIS — R2681 Unsteadiness on feet: Secondary | ICD-10-CM | POA: Diagnosis not present

## 2020-08-14 NOTE — Therapy (Signed)
[place] MAIN [facility] SERVICES [address] [place], [place], [address] Phone: [phone]   Fax:  [phone]  Physical Therapy Treatment  Patient Details  Name: Kimberly Frost MRN: [id number] Date of Birth: [birth year] Referring Provider (PT): Elmarie Shiley   Encounter Date: 08/14/2020   PT End of Session - 08/14/20 1447     Visit Number 26    Number of Visits 40    Date for PT Re-Evaluation 09/25/20    Authorization Type [employer]HC medicare    Authorization Time Period 4/10 PN 5/11    PT Start Time 0802    PT Stop Time 0845    PT Time Calculation (min) 43 min    Equipment Utilized During Treatment Gait belt    Activity Tolerance Patient tolerated treatment well;Patient limited by fatigue    Behavior During Therapy [facility] for tasks assessed/performed             Past Medical History:  Diagnosis Date   Chronic kidney disease    Edema    RLE   Hepatitis    Hep C   History of blood transfusion    Hypertension    Wears dentures     Past Surgical History:  Procedure Laterality Date   A/V FISTULAGRAM Left 04/11/2019   Procedure: A/V FISTULAGRAM;  Surgeon: Katha Cabal, MD;  Location: [place] [facility];  Service: Cardiovascular;  Laterality: Left;   A/V FISTULAGRAM Left 05/01/2019   Procedure: A/V FISTULAGRAM;  Surgeon: Algernon Huxley, MD;  Location: [place] [facility];  Service: Cardiovascular;  Laterality: Left;   A/V FISTULAGRAM Left 01/15/2020   Procedure: A/V FISTULAGRAM;  Surgeon: Algernon Huxley, MD;  Location: [place] [facility];  Service: Cardiovascular;  Laterality: Left;   A/V FISTULAGRAM Left 04/22/2020   Procedure: A/V FISTULAGRAM;  Surgeon: Algernon Huxley, MD;  Location: [place] [facility];  Service: Cardiovascular;  Laterality: Left;   AV FISTULA PLACEMENT Left 06/16/2018   Procedure: Left Arm ARTERIOVENOUS (AV) FISTULA CREATION;  Surgeon: Marty Heck, MD;  Location: [place];  Service: Vascular;  Laterality: Left;    MULTIPLE TOOTH EXTRACTIONS     MYOMECTOMY      There were no vitals filed for this visit.   Subjective Assessment - 08/14/20 0803     Subjective Pt reports she feels dizzy currently but states, "but not like last time." Pt reports no pain. She says she is eating pickles to help her BP.    Pertinent History Patient has been having dialysis over a year and was having some problems with passing out due to BP regulation and was using a RW . Now she is not having the passing out symptoms. She has good days and bad days at the hospital. She is trying to get a kidney and the nurse thought she needed to have PT to have her use a RW. She is currently not using a RW. If she feels weak after dialysis she uses a scooter at the store if she needs it.    Currently in Pain? No/denies                TREATMENT-  TODAY- Seated RUE BP: 111/61 mmHg, 96 HR bpm Standing RUE BP: 88/49 mmHg, HR 103 bpm Pt reports feeling light-headed with standing.    Nustep level 0, pt reports feels good to use. Maintains SPM in 38s. Pt reports no light-headedness with exercise.  Close monitoring for response to exercise  d/t pt BP.  6MWT - 1180 ft. Pt reports no dizziness.  Seated interventions continued due to fatigue  DGI- deferred.  Pt in sitting with # 3lb ankle weights on each LE: 2 rounds of the following -Seated marches, with upright posture, back away from back of chair for abdominal/trunk activation/stabilization - 1x12 each LE; pt rates medium -Seated LAQ - 1x12 each LE, -Seated heel toe raises - 1x12 - Seated adduction ball squeeze - 1x12   Seated DF with 3# AW 2x20  RTB seated hip abduction - 3x12 BLEs  BP at end of appointment, taken on RUE: seated: 112/61 mmHg, B94 bpm Standing: 96/58 HR 112 bpm  PT ambulates with pt to front desk where pt taken back to her car in transport chair for safety.      PT Education - 08/14/20 1446     Education Details continued education on goal  reassessment, progress in PT, indications for POC    Person(s) Educated Patient    Methods Explanation;Demonstration;Verbal cues    Comprehension Verbalized understanding;Returned demonstration              PT Short Term Goals - 07/31/20 1057       PT SHORT TERM GOAL #1   Title Patient will be independent in home exercise program to improve strength/mobility for better functional independence with ADLs.    Baseline 4/27: unable to when she was having medication issues 6/29: hep compliant    Time 4    Period Weeks    Status Achieved    Target Date 06/26/20      PT SHORT TERM GOAL #2   Title Patient will increase BLE gross strength to 4+/5 as to improve functional strength for independent gait, increased standing tolerance and increased ADL ability.    Baseline 01/26: 4/5 4/27: see note 6/29: grossly 4/5    Time 4    Period Weeks    Status Partially Met    Target Date 08/28/20      PT SHORT TERM GOAL #3   Title Patient will ascend/descend 4 stairs without rail assist independently without loss of balance to improve ability to get in/out of home.    Baseline 12/29: able to perform 4 stairs x3 with b/l rail today and no loss of balance    Time 8    Period Weeks    Status Achieved    Target Date 01/10/20               PT Long Term Goals - 08/14/20 1451       PT LONG TERM GOAL #1   Title Patient will increase six minute walk test distance to >1500 for progression to age norm community ambulator and improve gait ability    Baseline 4/27: 960 ft with one seated rest break 6/29: deferred due to orthostatic hypotension; 7/13: 1180 ft    Time 8    Period Weeks    Status On-going    Target Date 09/25/20      PT LONG TERM GOAL #2   Title Patient will ascend/descend 4 stairs without rail assist independently without loss of balance to improve ability to get in/out of home.    Baseline see above 6/29:    Time 8    Period Weeks    Status Achieved    Target Date 09/25/20       PT LONG TERM GOAL #3   Title Patient will increase 10 meter walk test to >1.47ms as to improve  gait speed for better community ambulation and to reduce fall risk.    Baseline 12/13/19= .74msec, 01/26: 1.36 m/sec 4.27: >1.0    Time 8    Period Weeks    Status Achieved    Target Date 09/25/20      PT LONG TERM GOAL #4   Title Patient (> 625years old) will complete five times sit to stand test in < 15 seconds indicating an increased LE strength and improved balance.    Baseline 01/26: 18s 4/27: 13.8 seconds no UE support    Time 8    Period Weeks    Status Achieved    Target Date 09/25/20      PT LONG TERM GOAL #5   Title Patient will increase ABC scale score >80% to demonstrate better functional mobility and better confidence with ADLs    Baseline 01/26: 76% 4/27: 55% 6/29 6/29: 89%    Time 8    Period Weeks    Status Achieved    Target Date 09/25/20      PT LONG TERM GOAL #6   Title Patient will increase FOTO score to equal to or greater than  85%   to demonstrate statistically significant improvement in mobility and quality of life.    Baseline 4/27: 61% 6/29: 72%    Time 8    Period Weeks    Status Partially Met    Target Date 09/25/20      PT LONG TERM GOAL #7   Title Patient will increase dynamic gait index score to >19/24 as to demonstrate reduced fall risk and improved dynamic gait balance for better safety with community/home ambulation.    Baseline 6/29: perform next session; 7/6: deferred d/t pt BP and dizziness    Time 8    Period Weeks    Status New    Target Date 09/25/20                   Plan - 08/14/20 1447     Clinical Impression Statement Goal reassessment continued on this date. Pt completes 6MWT, where she ambulates 1180 ft in 6 min, indicating improvement from previous assessment and increased gait ability/endurance. Although pt shows improvement, she still fatigues quickly with remaining therex, all performed seated. DGI deferred d/t  fatigue. The pt will benefit from further skilled PT to improve generalized strength, ROM and gait ability in order to increase safety with all activities.    Personal Factors and Comorbidities Comorbidity 1;Comorbidity 2;Comorbidity 3+    Comorbidities Patient has PMHx :Hyperparathyroidism, HTN Hep C: Anemia ESRD: She gets dialysis 3 x week. She follows with nephrology. [facility]    Examination-Activity Limitations Bed Mobility;Carry;Caring for Others;Toileting;Stand;Stairs;Locomotion Level;Squat;Dressing;Sit;Transfers    Examination-Participation Restrictions Cleaning;Community Activity;Driving;Meal Prep;Medication Management;Laundry;Interpersonal Relationship;Occupation;Personal Finances;Shop;Yard Work;Volunteer    Stability/Clinical Decision Making Stable/Uncomplicated    Rehab Potential Good    PT Frequency 1x / week    PT Duration 8 weeks    PT Treatment/Interventions Therapeutic exercise;Therapeutic activities;Balance training;Neuromuscular re-education;Stair training;Gait training;Energy conservation;Visual/perceptual remediation/compensation;Patient/family education;ADLs/Self Care Home Management;Canalith Repostioning;Cryotherapy;Electrical Stimulation;Biofeedback;Iontophoresis 486mml Dexamethasone;Moist Heat;Ultrasound;Passive range of motion;Dry needling;Splinting;Taping;Vestibular    PT Next Visit Plan DGI    PT Home Exercise Plan no updates this session    Consulted and Agree with Plan of Care Patient             Patient will benefit from skilled therapeutic intervention in order to improve the following deficits and  impairments:  Abnormal gait, Decreased activity tolerance, Difficulty walking, Cardiopulmonary status limiting activity, Decreased balance, Decreased mobility, Decreased strength, Dizziness, Impaired perceived functional ability, Improper body mechanics, Impaired sensation  Visit Diagnosis: Muscle  weakness (generalized)  Other abnormalities of gait and mobility     Problem List Patient Active Problem List   Diagnosis Date Noted   A-V fistula ([place]) 04/26/2019   ESRD (end stage renal disease) ([place]) 10/26/2018   Secondary hyperparathyroidism of renal origin ([place]) 09/06/2018   Anemia due to stage 5 chronic kidney disease ([place]) 02/26/2018   Hepatitis C 02/15/2013   Essential hypertension, benign 12/26/2008   Ricard Dillon PT, DPT 08/14/2020, 2:55 PM  [place] MAIN [facility] SERVICES [address] [place], [place], [address] Phone: [phone]   Fax:  [phone]  Name: Kimberly Frost MRN: [id number] Date of Birth: Oct 27, 1950

## 2020-08-15 DIAGNOSIS — Z992 Dependence on renal dialysis: Secondary | ICD-10-CM | POA: Diagnosis not present

## 2020-08-15 DIAGNOSIS — D631 Anemia in chronic kidney disease: Secondary | ICD-10-CM | POA: Diagnosis not present

## 2020-08-15 DIAGNOSIS — N2581 Secondary hyperparathyroidism of renal origin: Secondary | ICD-10-CM | POA: Diagnosis not present

## 2020-08-15 DIAGNOSIS — D689 Coagulation defect, unspecified: Secondary | ICD-10-CM | POA: Diagnosis not present

## 2020-08-15 DIAGNOSIS — N186 End stage renal disease: Secondary | ICD-10-CM | POA: Diagnosis not present

## 2020-08-17 DIAGNOSIS — N186 End stage renal disease: Secondary | ICD-10-CM | POA: Diagnosis not present

## 2020-08-17 DIAGNOSIS — N2581 Secondary hyperparathyroidism of renal origin: Secondary | ICD-10-CM | POA: Diagnosis not present

## 2020-08-17 DIAGNOSIS — D631 Anemia in chronic kidney disease: Secondary | ICD-10-CM | POA: Diagnosis not present

## 2020-08-17 DIAGNOSIS — D689 Coagulation defect, unspecified: Secondary | ICD-10-CM | POA: Diagnosis not present

## 2020-08-17 DIAGNOSIS — Z992 Dependence on renal dialysis: Secondary | ICD-10-CM | POA: Diagnosis not present

## 2020-08-20 DIAGNOSIS — D631 Anemia in chronic kidney disease: Secondary | ICD-10-CM | POA: Diagnosis not present

## 2020-08-20 DIAGNOSIS — Z992 Dependence on renal dialysis: Secondary | ICD-10-CM | POA: Diagnosis not present

## 2020-08-20 DIAGNOSIS — N2581 Secondary hyperparathyroidism of renal origin: Secondary | ICD-10-CM | POA: Diagnosis not present

## 2020-08-20 DIAGNOSIS — D689 Coagulation defect, unspecified: Secondary | ICD-10-CM | POA: Diagnosis not present

## 2020-08-20 DIAGNOSIS — N186 End stage renal disease: Secondary | ICD-10-CM | POA: Diagnosis not present

## 2020-08-22 DIAGNOSIS — D689 Coagulation defect, unspecified: Secondary | ICD-10-CM | POA: Diagnosis not present

## 2020-08-22 DIAGNOSIS — Z992 Dependence on renal dialysis: Secondary | ICD-10-CM | POA: Diagnosis not present

## 2020-08-22 DIAGNOSIS — N2581 Secondary hyperparathyroidism of renal origin: Secondary | ICD-10-CM | POA: Diagnosis not present

## 2020-08-22 DIAGNOSIS — D631 Anemia in chronic kidney disease: Secondary | ICD-10-CM | POA: Diagnosis not present

## 2020-08-22 DIAGNOSIS — N186 End stage renal disease: Secondary | ICD-10-CM | POA: Diagnosis not present

## 2020-08-24 DIAGNOSIS — N186 End stage renal disease: Secondary | ICD-10-CM | POA: Diagnosis not present

## 2020-08-24 DIAGNOSIS — Z992 Dependence on renal dialysis: Secondary | ICD-10-CM | POA: Diagnosis not present

## 2020-08-24 DIAGNOSIS — N2581 Secondary hyperparathyroidism of renal origin: Secondary | ICD-10-CM | POA: Diagnosis not present

## 2020-08-24 DIAGNOSIS — D631 Anemia in chronic kidney disease: Secondary | ICD-10-CM | POA: Diagnosis not present

## 2020-08-24 DIAGNOSIS — D689 Coagulation defect, unspecified: Secondary | ICD-10-CM | POA: Diagnosis not present

## 2020-08-27 DIAGNOSIS — D689 Coagulation defect, unspecified: Secondary | ICD-10-CM | POA: Diagnosis not present

## 2020-08-27 DIAGNOSIS — Z992 Dependence on renal dialysis: Secondary | ICD-10-CM | POA: Diagnosis not present

## 2020-08-27 DIAGNOSIS — N2581 Secondary hyperparathyroidism of renal origin: Secondary | ICD-10-CM | POA: Diagnosis not present

## 2020-08-27 DIAGNOSIS — D631 Anemia in chronic kidney disease: Secondary | ICD-10-CM | POA: Diagnosis not present

## 2020-08-27 DIAGNOSIS — N186 End stage renal disease: Secondary | ICD-10-CM | POA: Diagnosis not present

## 2020-08-28 ENCOUNTER — Ambulatory Visit: Payer: Medicare Other

## 2020-08-28 ENCOUNTER — Other Ambulatory Visit: Payer: Self-pay

## 2020-08-28 DIAGNOSIS — R2689 Other abnormalities of gait and mobility: Secondary | ICD-10-CM | POA: Diagnosis not present

## 2020-08-28 DIAGNOSIS — M6281 Muscle weakness (generalized): Secondary | ICD-10-CM | POA: Diagnosis not present

## 2020-08-28 DIAGNOSIS — R2681 Unsteadiness on feet: Secondary | ICD-10-CM | POA: Diagnosis not present

## 2020-08-28 NOTE — Therapy (Signed)
[place] MAIN [facility] SERVICES [address] [place], [place], [address] Phone: [phone]   Fax:  [phone]  Physical Therapy Treatment  Patient Details  Name: Kimberly Frost MRN: [id number] Date of Birth: [birth year] Referring Provider (PT): Elmarie Shiley   Encounter Date: 08/28/2020   PT End of Session - 08/28/20 0836     Visit Number 27    Number of Visits 40    Date for PT Re-Evaluation 09/25/20    Authorization Type UHC medicare    Authorization Time Period 4/10 PN 5/11    PT Start Time 0806    PT Stop Time 0845    PT Time Calculation (min) 39 min    Equipment Utilized During Treatment Gait belt    Activity Tolerance Patient tolerated treatment well;Patient limited by fatigue    Behavior During Therapy [facility] for tasks assessed/performed             Past Medical History:  Diagnosis Date   Chronic kidney disease    Edema    RLE   Hepatitis    Hep C   History of blood transfusion    Hypertension    Wears dentures     Past Surgical History:  Procedure Laterality Date   A/V FISTULAGRAM Left 04/11/2019   Procedure: A/V FISTULAGRAM;  Surgeon: Katha Cabal, MD;  Location: [place] [facility];  Service: Cardiovascular;  Laterality: Left;   A/V FISTULAGRAM Left 05/01/2019   Procedure: A/V FISTULAGRAM;  Surgeon: Algernon Huxley, MD;  Location: [place] [facility];  Service: Cardiovascular;  Laterality: Left;   A/V FISTULAGRAM Left 01/15/2020   Procedure: A/V FISTULAGRAM;  Surgeon: Algernon Huxley, MD;  Location: [place] [facility];  Service: Cardiovascular;  Laterality: Left;   A/V FISTULAGRAM Left 04/22/2020   Procedure: A/V FISTULAGRAM;  Surgeon: Algernon Huxley, MD;  Location: [place] [facility];  Service: Cardiovascular;  Laterality: Left;   AV FISTULA PLACEMENT Left 06/16/2018   Procedure: Left Arm ARTERIOVENOUS (AV) FISTULA CREATION;  Surgeon: Marty Heck, MD;  Location: [place];  Service: Vascular;  Laterality: Left;    MULTIPLE TOOTH EXTRACTIONS     MYOMECTOMY      There were no vitals filed for this visit.   Subjective Assessment - 08/28/20 0802     Subjective Pt reports her BP was high at dialysis yesterday. Pt reports she feels OK.    Pertinent History Patient has been having dialysis over a year and was having some problems with passing out due to BP regulation and was using a RW . Now she is not having the passing out symptoms. She has good days and bad days at the hospital. She is trying to get a kidney and the nurse thought she needed to have PT to have her use a RW. She is currently not using a RW. If she feels weak after dialysis she uses a scooter at the store if she needs it.    Currently in Pain? No/denies            TREATMENT-  TODAY- Seated RUE BP: 109/58 mmHg, 99 HR bpm Standing RUE BP: 70/53 mmHg, HR 111 bpm Pt reports no light-headedness with standing.    DGI- 23/24  Pt in sitting with # 3lb ankle weights on each LE: 2 rounds of the following -Seated marches, with upright posture, back away from back of chair for abdominal/trunk activation/stabilization - x10 each LE; pt rates medium -Seated LAQ -  x12 each LE; rates medium  -Seated heel toe raises - x15 - Seated adduction ball squeeze - x12 easy  Standing hip abduction 1x10 each LE   Nustep (seat at 9) level 1-3 RPE 6/10, monitored throughout for sx/dizziness. Cuing to maintain SPM in 60s, pt maintains in 50s-60s d/t fatigue.   BP at end of appointment, taken on RUE: seated: 122/67 mmHg, HR 100 bpm Standing: 81/52 HR 91 bpm No dizziness reported. Pt states, "I feel good."  PT provides education throughout session in the form of VC/TC/Demo to facilitate movement at target joints and correct muscle activation with all exercises performed. Pt shows good carryover within session after cuing   PT Education - 08/28/20 0836     Education Details goal reassessment, indication for progress/PT, exercise technique, body  mechanics    Person(s) Educated Patient    Methods Explanation;Demonstration;Verbal cues    Comprehension Verbalized understanding;Returned demonstration              PT Short Term Goals - 07/31/20 1057       PT SHORT TERM GOAL #1   Title Patient will be independent in home exercise program to improve strength/mobility for better functional independence with ADLs.    Baseline 4/27: unable to when she was having medication issues 6/29: hep compliant    Time 4    Period Weeks    Status Achieved    Target Date 06/26/20      PT SHORT TERM GOAL #2   Title Patient will increase BLE gross strength to 4+/5 as to improve functional strength for independent gait, increased standing tolerance and increased ADL ability.    Baseline 01/26: 4/5 4/27: see note 6/29: grossly 4/5    Time 4    Period Weeks    Status Partially Met    Target Date 08/28/20      PT SHORT TERM GOAL #3   Title Patient will ascend/descend 4 stairs without rail assist independently without loss of balance to improve ability to get in/out of home.    Baseline 12/29: able to perform 4 stairs x3 with b/l rail today and no loss of balance    Time 8    Period Weeks    Status Achieved    Target Date 01/10/20               PT Long Term Goals - 08/28/20 0944       PT LONG TERM GOAL #1   Title Patient will increase six minute walk test distance to >1500 for progression to age norm community ambulator and improve gait ability    Baseline 4/27: 960 ft with one seated rest break 6/29: deferred due to orthostatic hypotension; 7/13: 1180 ft    Time 8    Period Weeks    Status On-going    Target Date 09/25/20      PT LONG TERM GOAL #2   Title Patient will ascend/descend 4 stairs without rail assist independently without loss of balance to improve ability to get in/out of home.    Baseline see above 6/29:    Time 8    Period Weeks    Status Achieved      PT LONG TERM GOAL #3   Title Patient will increase 10  meter walk test to >1.13ms as to improve gait speed for better community ambulation and to reduce fall risk.    Baseline 12/13/19= .8103mec, 01/26: 1.36 m/sec 4.27: >1.0    Time 8  Period Weeks    Status Achieved      PT LONG TERM GOAL #4   Title Patient (> 11 years old) will complete five times sit to stand test in < 15 seconds indicating an increased LE strength and improved balance.    Baseline 01/26: 18s 4/27: 13.8 seconds no UE support    Time 8    Period Weeks    Status Achieved      PT LONG TERM GOAL #5   Title Patient will increase ABC scale score >80% to demonstrate better functional mobility and better confidence with ADLs    Baseline 01/26: 76% 4/27: 55% 6/29 6/29: 89%    Time 8    Period Weeks    Status Achieved      PT LONG TERM GOAL #6   Title Patient will increase FOTO score to equal to or greater than  85%   to demonstrate statistically significant improvement in mobility and quality of life.    Baseline 4/27: 61% 6/29: 72%    Time 8    Period Weeks    Status Partially Met    Target Date 09/25/20      PT LONG TERM GOAL #7   Title Patient will increase dynamic gait index score to >19/24 as to demonstrate reduced fall risk and improved dynamic gait balance for better safety with community/home ambulation.    Baseline 6/29: perform next session; 7/6: deferred d/t pt BP and dizziness; 7/27: 23/24    Time 8    Period Weeks    Status Achieved                   Plan - 08/28/20 2122     Clinical Impression Statement Testing completed on this date. Pt DGI score was 23/24 indicating improved dynamic balance. Although pt shows progress, she qiuckly fatigues with exercise and is only able to complete one set of standing therex. Pt continues to be limited d/t orthostatic hypotension (see note for BP reading). The pt will benefit from further skilled PT to improve generalized strength, endurance, mobility and gait ability to increase ease and safety with ADLs.     Personal Factors and Comorbidities Comorbidity 1;Comorbidity 2;Comorbidity 3+    Comorbidities Patient has PMHx :Hyperparathyroidism, HTN Hep C: Anemia ESRD: She gets dialysis 3 x week. She follows with nephrology. She is currently waiting to be listed at [facility] for a kidney transplant.    Examination-Activity Limitations Bed Mobility;Carry;Caring for Others;Toileting;Stand;Stairs;Locomotion Level;Squat;Dressing;Sit;Transfers    Examination-Participation Restrictions Cleaning;Community Activity;Driving;Meal Prep;Medication Management;Laundry;Interpersonal Relationship;Occupation;Personal Finances;Shop;Yard Work;Volunteer    Stability/Clinical Decision Making Stable/Uncomplicated    Rehab Potential Good    PT Frequency 1x / week    PT Duration 8 weeks    PT Treatment/Interventions Therapeutic exercise;Therapeutic activities;Balance training;Neuromuscular re-education;Stair training;Gait training;Energy conservation;Visual/perceptual remediation/compensation;Patient/family education;ADLs/Self Care Home Management;Canalith Repostioning;Cryotherapy;Electrical Stimulation;Biofeedback;Iontophoresis 12m/ml Dexamethasone;Moist Heat;Ultrasound;Passive range of motion;Dry needling;Splinting;Taping;Vestibular    PT Next Visit Plan strengthening, endurance    PT Home Exercise Plan no updates this session    Consulted and Agree with Plan of Care Patient             Patient will benefit from skilled therapeutic intervention in order to improve the following deficits and impairments:  Abnormal gait, Decreased activity tolerance, Difficulty walking, Cardiopulmonary status limiting activity, Decreased balance, Decreased mobility, Decreased strength, Dizziness, Impaired perceived functional ability, Improper body mechanics, Impaired sensation  Visit Diagnosis: Muscle weakness (generalized)  Other abnormalities of gait and mobility  Unsteadiness on feet  Problem List Patient Active Problem List    Diagnosis Date Noted   A-V fistula ([place]) 04/26/2019   ESRD (end stage renal disease) ([place]) 10/26/2018   Secondary hyperparathyroidism of renal origin ([place]) 09/06/2018   Anemia due to stage 5 chronic kidney disease ([place]) 02/26/2018   Hepatitis C 02/15/2013   Essential hypertension, benign 12/26/2008   Ricard Dillon PT, DPT 08/28/2020, 12:53 PM  [place] MAIN [facility] SERVICES [address] [place], [place], [address] Phone: [phone]   Fax:  [phone]  Name: HANIYAH MACIOLEK MRN: [id number] Date of Birth: 1950-09-07

## 2020-08-29 DIAGNOSIS — N2581 Secondary hyperparathyroidism of renal origin: Secondary | ICD-10-CM | POA: Diagnosis not present

## 2020-08-29 DIAGNOSIS — N186 End stage renal disease: Secondary | ICD-10-CM | POA: Diagnosis not present

## 2020-08-29 DIAGNOSIS — Z992 Dependence on renal dialysis: Secondary | ICD-10-CM | POA: Diagnosis not present

## 2020-08-29 DIAGNOSIS — D689 Coagulation defect, unspecified: Secondary | ICD-10-CM | POA: Diagnosis not present

## 2020-08-29 DIAGNOSIS — D631 Anemia in chronic kidney disease: Secondary | ICD-10-CM | POA: Diagnosis not present

## 2020-08-31 DIAGNOSIS — D631 Anemia in chronic kidney disease: Secondary | ICD-10-CM | POA: Diagnosis not present

## 2020-08-31 DIAGNOSIS — N186 End stage renal disease: Secondary | ICD-10-CM | POA: Diagnosis not present

## 2020-08-31 DIAGNOSIS — D689 Coagulation defect, unspecified: Secondary | ICD-10-CM | POA: Diagnosis not present

## 2020-08-31 DIAGNOSIS — N2581 Secondary hyperparathyroidism of renal origin: Secondary | ICD-10-CM | POA: Diagnosis not present

## 2020-08-31 DIAGNOSIS — Z992 Dependence on renal dialysis: Secondary | ICD-10-CM | POA: Diagnosis not present

## 2020-09-01 DIAGNOSIS — Z992 Dependence on renal dialysis: Secondary | ICD-10-CM | POA: Diagnosis not present

## 2020-09-01 DIAGNOSIS — N186 End stage renal disease: Secondary | ICD-10-CM | POA: Diagnosis not present

## 2020-09-01 DIAGNOSIS — I129 Hypertensive chronic kidney disease with stage 1 through stage 4 chronic kidney disease, or unspecified chronic kidney disease: Secondary | ICD-10-CM | POA: Diagnosis not present

## 2020-09-03 DIAGNOSIS — D509 Iron deficiency anemia, unspecified: Secondary | ICD-10-CM | POA: Diagnosis not present

## 2020-09-03 DIAGNOSIS — D631 Anemia in chronic kidney disease: Secondary | ICD-10-CM | POA: Diagnosis not present

## 2020-09-03 DIAGNOSIS — N186 End stage renal disease: Secondary | ICD-10-CM | POA: Diagnosis not present

## 2020-09-03 DIAGNOSIS — N2581 Secondary hyperparathyroidism of renal origin: Secondary | ICD-10-CM | POA: Diagnosis not present

## 2020-09-03 DIAGNOSIS — Z992 Dependence on renal dialysis: Secondary | ICD-10-CM | POA: Diagnosis not present

## 2020-09-03 DIAGNOSIS — D689 Coagulation defect, unspecified: Secondary | ICD-10-CM | POA: Diagnosis not present

## 2020-09-04 ENCOUNTER — Ambulatory Visit: Payer: Medicare Other

## 2020-09-05 DIAGNOSIS — N186 End stage renal disease: Secondary | ICD-10-CM | POA: Diagnosis not present

## 2020-09-05 DIAGNOSIS — Z992 Dependence on renal dialysis: Secondary | ICD-10-CM | POA: Diagnosis not present

## 2020-09-05 DIAGNOSIS — N2581 Secondary hyperparathyroidism of renal origin: Secondary | ICD-10-CM | POA: Diagnosis not present

## 2020-09-05 DIAGNOSIS — D509 Iron deficiency anemia, unspecified: Secondary | ICD-10-CM | POA: Diagnosis not present

## 2020-09-05 DIAGNOSIS — D631 Anemia in chronic kidney disease: Secondary | ICD-10-CM | POA: Diagnosis not present

## 2020-09-05 DIAGNOSIS — D689 Coagulation defect, unspecified: Secondary | ICD-10-CM | POA: Diagnosis not present

## 2020-09-07 DIAGNOSIS — D509 Iron deficiency anemia, unspecified: Secondary | ICD-10-CM | POA: Diagnosis not present

## 2020-09-07 DIAGNOSIS — D631 Anemia in chronic kidney disease: Secondary | ICD-10-CM | POA: Diagnosis not present

## 2020-09-07 DIAGNOSIS — Z992 Dependence on renal dialysis: Secondary | ICD-10-CM | POA: Diagnosis not present

## 2020-09-07 DIAGNOSIS — N2581 Secondary hyperparathyroidism of renal origin: Secondary | ICD-10-CM | POA: Diagnosis not present

## 2020-09-07 DIAGNOSIS — D689 Coagulation defect, unspecified: Secondary | ICD-10-CM | POA: Diagnosis not present

## 2020-09-07 DIAGNOSIS — N186 End stage renal disease: Secondary | ICD-10-CM | POA: Diagnosis not present

## 2020-09-10 DIAGNOSIS — D689 Coagulation defect, unspecified: Secondary | ICD-10-CM | POA: Diagnosis not present

## 2020-09-10 DIAGNOSIS — D509 Iron deficiency anemia, unspecified: Secondary | ICD-10-CM | POA: Diagnosis not present

## 2020-09-10 DIAGNOSIS — N2581 Secondary hyperparathyroidism of renal origin: Secondary | ICD-10-CM | POA: Diagnosis not present

## 2020-09-10 DIAGNOSIS — Z992 Dependence on renal dialysis: Secondary | ICD-10-CM | POA: Diagnosis not present

## 2020-09-10 DIAGNOSIS — N186 End stage renal disease: Secondary | ICD-10-CM | POA: Diagnosis not present

## 2020-09-10 DIAGNOSIS — D631 Anemia in chronic kidney disease: Secondary | ICD-10-CM | POA: Diagnosis not present

## 2020-09-11 ENCOUNTER — Other Ambulatory Visit: Payer: Self-pay

## 2020-09-11 ENCOUNTER — Ambulatory Visit: Payer: Medicare Other | Attending: Internal Medicine

## 2020-09-11 DIAGNOSIS — M6281 Muscle weakness (generalized): Secondary | ICD-10-CM | POA: Insufficient documentation

## 2020-09-11 DIAGNOSIS — R2681 Unsteadiness on feet: Secondary | ICD-10-CM

## 2020-09-11 DIAGNOSIS — R2689 Other abnormalities of gait and mobility: Secondary | ICD-10-CM

## 2020-09-11 NOTE — Therapy (Signed)
[place] MAIN [facility] SERVICES [address] [place], [place], [address] Phone: [phone]   Fax:  [phone]  Physical Therapy Treatment  Patient Details  Name: Kimberly Frost MRN: [id number] Date of Birth: 1950/06/03 Referring Provider (PT): Elmarie Shiley   Encounter Date: 09/11/2020   PT End of Session - 09/11/20 0757     Visit Number 28    Number of Visits 40    Date for PT Re-Evaluation 09/25/20    Authorization Type UHC medicare    Authorization Time Period 8/10 PN 5/11    PT Start Time 0800    PT Stop Time 0844    PT Time Calculation (min) 44 min    Equipment Utilized During Treatment Gait belt    Activity Tolerance Patient tolerated treatment well;Patient limited by fatigue    Behavior During Therapy [facility] for tasks assessed/performed             Past Medical History:  Diagnosis Date   Chronic kidney disease    Edema    RLE   Hepatitis    Hep C   History of blood transfusion    Hypertension    Wears dentures     Past Surgical History:  Procedure Laterality Date   A/V FISTULAGRAM Left 04/11/2019   Procedure: A/V FISTULAGRAM;  Surgeon: Katha Cabal, MD;  Location: [place] [facility];  Service: Cardiovascular;  Laterality: Left;   A/V FISTULAGRAM Left 05/01/2019   Procedure: A/V FISTULAGRAM;  Surgeon: Algernon Huxley, MD;  Location: [place] [facility];  Service: Cardiovascular;  Laterality: Left;   A/V FISTULAGRAM Left 01/15/2020   Procedure: A/V FISTULAGRAM;  Surgeon: Algernon Huxley, MD;  Location: [place] [facility];  Service: Cardiovascular;  Laterality: Left;   A/V FISTULAGRAM Left 04/22/2020   Procedure: A/V FISTULAGRAM;  Surgeon: Algernon Huxley, MD;  Location: [place] [facility];  Service: Cardiovascular;  Laterality: Left;   AV FISTULA PLACEMENT Left 06/16/2018   Procedure: Left Arm ARTERIOVENOUS (AV) FISTULA CREATION;  Surgeon: Marty Heck, MD;  Location: [place];  Service: Vascular;  Laterality: Left;    MULTIPLE TOOTH EXTRACTIONS     MYOMECTOMY      There were no vitals filed for this visit.   Subjective Assessment - 09/11/20 0801     Subjective Patient went to [place] for testing last week. Was there all day for testing.No falls or LOB since last session.    Pertinent History Patient has been having dialysis over a year and was having some problems with passing out due to BP regulation and was using a RW . Now she is not having the passing out symptoms. She has good days and bad days at the hospital. She is trying to get a kidney and the nurse thought she needed to have PT to have her use a RW. She is currently not using a RW. If she feels weak after dialysis she uses a scooter at the store if she needs it.    Currently in Pain? No/denies               Patient went to [place] for testing yesterday. Was there all day for testing.   BP at start of session: 121/75    Standing by support bar: Standing with CGA next to support surface:  6" step: toe taps no UE support 20x each LE 6" step: lateral step up/down 12x each LE; frequent cues for foot placement and  reduction of crossing of legs. 6" step up/down 10x each LE; cueing for sequencing   Airex pad: static stand 30 seconds x 2 trials, noticeable trembling of ankles/LE's with fatigue and challenge to maintain stability Airex pad: horizontal head turns 30 seconds scanning room 10x ; cueing for arc of motion Airex pad: vertical head turns 30 seconds, cueing for arc of motion, noticeable sway with upward gaze increasing demand on ankle righting reaction musculature Airex pad 6" step modified tandem stance 2x 30 seconds each LE placement  Modified lateral squat 10x each side ; cues for gluteal activation ane foot positioning 3 way hip hike with BUE support (forward, lateral, backwards); 10x each direction each LE cues for sequencing.  Single limb stance 30 seconds x 2 trials    Seated:  Seated hip add squeezes x 10 reps  with 3 second holds with cues for muscle activation and ball placement Seated clamshells with GTB x 20x reps GTB hamstring curl against PT resistance 15x each LE     Pt educated throughout session about proper posture and technique with exercises. Improved exercise technique, movement at target joints, use of target muscles after min to mod verbal, visual, tactile cues.     Patient is highly motivated throughout physical therapy session. She is able to tolerate all standing strengthening and stabilization interventions well. Her blood pressure and vitals are functional this session allowing for progression of interventions. Her ankle righting reactions are improving on unstable surfaces. The pt will benefit from further skilled PT to improve generalized strength, endurance, mobility and gait ability to increase ease and safety with ADLs.                  PT Education - 09/11/20 0757     Education Details exercise technique, body mechanics    Person(s) Educated Patient    Methods Explanation;Demonstration;Tactile cues;Verbal cues    Comprehension Verbalized understanding;Returned demonstration;Verbal cues required;Tactile cues required              PT Short Term Goals - 07/31/20 1057       PT SHORT TERM GOAL #1   Title Patient will be independent in home exercise program to improve strength/mobility for better functional independence with ADLs.    Baseline 4/27: unable to when she was having medication issues 6/29: hep compliant    Time 4    Period Weeks    Status Achieved    Target Date 06/26/20      PT SHORT TERM GOAL #2   Title Patient will increase BLE gross strength to 4+/5 as to improve functional strength for independent gait, increased standing tolerance and increased ADL ability.    Baseline 01/26: 4/5 4/27: see note 6/29: grossly 4/5    Time 4    Period Weeks    Status Partially Met    Target Date 08/28/20      PT SHORT TERM GOAL #3   Title Patient  will ascend/descend 4 stairs without rail assist independently without loss of balance to improve ability to get in/out of home.    Baseline 12/29: able to perform 4 stairs x3 with b/l rail today and no loss of balance    Time 8    Period Weeks    Status Achieved    Target Date 01/10/20               PT Long Term Goals - 08/28/20 0944       PT LONG TERM GOAL #1  Title Patient will increase six minute walk test distance to >1500 for progression to age norm community ambulator and improve gait ability    Baseline 4/27: 960 ft with one seated rest break 6/29: deferred due to orthostatic hypotension; 7/13: 1180 ft    Time 8    Period Weeks    Status On-going    Target Date 09/25/20      PT LONG TERM GOAL #2   Title Patient will ascend/descend 4 stairs without rail assist independently without loss of balance to improve ability to get in/out of home.    Baseline see above 6/29:    Time 8    Period Weeks    Status Achieved      PT LONG TERM GOAL #3   Title Patient will increase 10 meter walk test to >1.37ms as to improve gait speed for better community ambulation and to reduce fall risk.    Baseline 12/13/19= .849mec, 01/26: 1.36 m/sec 4.27: >1.0    Time 8    Period Weeks    Status Achieved      PT LONG TERM GOAL #4   Title Patient (> 6053ears old) will complete five times sit to stand test in < 15 seconds indicating an increased LE strength and improved balance.    Baseline 01/26: 18s 4/27: 13.8 seconds no UE support    Time 8    Period Weeks    Status Achieved      PT LONG TERM GOAL #5   Title Patient will increase ABC scale score >80% to demonstrate better functional mobility and better confidence with ADLs    Baseline 01/26: 76% 4/27: 55% 6/29 6/29: 89%    Time 8    Period Weeks    Status Achieved      PT LONG TERM GOAL #6   Title Patient will increase FOTO score to equal to or greater than  85%   to demonstrate statistically significant improvement in mobility  and quality of life.    Baseline 4/27: 61% 6/29: 72%    Time 8    Period Weeks    Status Partially Met    Target Date 09/25/20      PT LONG TERM GOAL #7   Title Patient will increase dynamic gait index score to >19/24 as to demonstrate reduced fall risk and improved dynamic gait balance for better safety with community/home ambulation.    Baseline 6/29: perform next session; 7/6: deferred d/t pt BP and dizziness; 7/27: 23/24    Time 8    Period Weeks    Status Achieved                   Plan - 09/11/20 0817     Clinical Impression Statement Patient is highly motivated throughout physical therapy session. She is able to tolerate all standing strengthening and stabilization interventions well. Her blood pressure and vitals are functional this session allowing for progression of interventions. Her ankle righting reactions are improving on unstable surfaces. The pt will benefit from further skilled PT to improve generalized strength, endurance, mobility and gait ability to increase ease and safety with ADLs.    Personal Factors and Comorbidities Comorbidity 1;Comorbidity 2;Comorbidity 3+    Comorbidities Patient has PMHx :Hyperparathyroidism, HTN Hep C: Anemia ESRD: She gets dialysis 3 x week. She follows with nephrology. [facility]    Examination-Activity Limitations Bed Mobility;Carry;Caring for Others;Toileting;Stand;Stairs;Locomotion Level;Squat;Dressing;Sit;Transfers  Examination-Participation Restrictions Cleaning;Community Activity;Driving;Meal Prep;Medication Management;Laundry;Interpersonal Relationship;Occupation;Personal Finances;Shop;Yard Work;Volunteer    Stability/Clinical Decision Making Stable/Uncomplicated    Rehab Potential Good    PT Frequency 1x / week    PT Duration 8 weeks    PT Treatment/Interventions Therapeutic exercise;Therapeutic activities;Balance training;Neuromuscular re-education;Stair  training;Gait training;Energy conservation;Visual/perceptual remediation/compensation;Patient/family education;ADLs/Self Care Home Management;Canalith Repostioning;Cryotherapy;Electrical Stimulation;Biofeedback;Iontophoresis 61m/ml Dexamethasone;Moist Heat;Ultrasound;Passive range of motion;Dry needling;Splinting;Taping;Vestibular    PT Next Visit Plan strengthening, endurance    PT Home Exercise Plan no updates this session    Consulted and Agree with Plan of Care Patient             Patient will benefit from skilled therapeutic intervention in order to improve the following deficits and impairments:  Abnormal gait, Decreased activity tolerance, Difficulty walking, Cardiopulmonary status limiting activity, Decreased balance, Decreased mobility, Decreased strength, Dizziness, Impaired perceived functional ability, Improper body mechanics, Impaired sensation  Visit Diagnosis: Other abnormalities of gait and mobility  Muscle weakness (generalized)  Unsteadiness on feet     Problem List Patient Active Problem List   Diagnosis Date Noted   A-V fistula ([place] 04/26/2019   [place]) [place] 10/26/2018   [place] ([place] 09/06/2018   Anemia due to stage 5 chronic kidney disease ([place] 02/26/2018   Hepatitis C 02/15/2013   Essential hypertension, benign 12/26/2008   MJanna Arch PT, DPT  09/11/2020, 8:44 AM  [place] [address]Phone: [phone]  Fax:  [phone] Name: Kimberly KENTNERMRN: [id number] of Birth: [birth year]

## 2020-09-12 DIAGNOSIS — D631 Anemia in chronic kidney disease: Secondary | ICD-10-CM | POA: Diagnosis not present

## 2020-09-12 DIAGNOSIS — N186 End stage renal disease: Secondary | ICD-10-CM | POA: Diagnosis not present

## 2020-09-12 DIAGNOSIS — N2581 Secondary hyperparathyroidism of renal origin: Secondary | ICD-10-CM | POA: Diagnosis not present

## 2020-09-12 DIAGNOSIS — D689 Coagulation defect, unspecified: Secondary | ICD-10-CM | POA: Diagnosis not present

## 2020-09-12 DIAGNOSIS — Z992 Dependence on renal dialysis: Secondary | ICD-10-CM | POA: Diagnosis not present

## 2020-09-12 DIAGNOSIS — D509 Iron deficiency anemia, unspecified: Secondary | ICD-10-CM | POA: Diagnosis not present

## 2020-09-14 DIAGNOSIS — N2581 Secondary hyperparathyroidism of renal origin: Secondary | ICD-10-CM | POA: Diagnosis not present

## 2020-09-14 DIAGNOSIS — D631 Anemia in chronic kidney disease: Secondary | ICD-10-CM | POA: Diagnosis not present

## 2020-09-14 DIAGNOSIS — D689 Coagulation defect, unspecified: Secondary | ICD-10-CM | POA: Diagnosis not present

## 2020-09-14 DIAGNOSIS — Z992 Dependence on renal dialysis: Secondary | ICD-10-CM | POA: Diagnosis not present

## 2020-09-14 DIAGNOSIS — D509 Iron deficiency anemia, unspecified: Secondary | ICD-10-CM | POA: Diagnosis not present

## 2020-09-14 DIAGNOSIS — N186 End stage renal disease: Secondary | ICD-10-CM | POA: Diagnosis not present

## 2020-09-17 DIAGNOSIS — D689 Coagulation defect, unspecified: Secondary | ICD-10-CM | POA: Diagnosis not present

## 2020-09-17 DIAGNOSIS — N2581 Secondary hyperparathyroidism of renal origin: Secondary | ICD-10-CM | POA: Diagnosis not present

## 2020-09-17 DIAGNOSIS — D631 Anemia in chronic kidney disease: Secondary | ICD-10-CM | POA: Diagnosis not present

## 2020-09-17 DIAGNOSIS — N186 End stage renal disease: Secondary | ICD-10-CM | POA: Diagnosis not present

## 2020-09-17 DIAGNOSIS — D509 Iron deficiency anemia, unspecified: Secondary | ICD-10-CM | POA: Diagnosis not present

## 2020-09-17 DIAGNOSIS — Z992 Dependence on renal dialysis: Secondary | ICD-10-CM | POA: Diagnosis not present

## 2020-09-18 ENCOUNTER — Other Ambulatory Visit: Payer: Self-pay

## 2020-09-18 ENCOUNTER — Ambulatory Visit: Payer: Medicare Other

## 2020-09-18 DIAGNOSIS — M6281 Muscle weakness (generalized): Secondary | ICD-10-CM

## 2020-09-18 DIAGNOSIS — R2681 Unsteadiness on feet: Secondary | ICD-10-CM

## 2020-09-18 DIAGNOSIS — R2689 Other abnormalities of gait and mobility: Secondary | ICD-10-CM

## 2020-09-18 NOTE — Therapy (Signed)
[place] MA[place] [facility] SERVICES [address] [place], [place], [address] Phone: [phone]   Fax:  [phone]  Physical Therapy Treatment  Patient Details  Name: Kimberly Frost MRN: [id number] Date of Birth: Jul 18, 1950 Referring Provider (PT): Elmarie Shiley   Encounter Date: 09/18/2020   PT End of Session - 09/18/20 0755     Visit Number 29    Number of Visits 40    Date for PT Re-Evaluation 09/25/20    Authorization Type U[employer] medicare    Authorization Time Period 9/10 PN 5/11    PT Start Time 0759    PT Stop Time 0844    PT Time Calculation (min) 45 min    Equipment Utilized During Treatment Gait belt    Activity Tolerance Patient tolerated treatment well;Patient limited by fatigue    Behavior During Therapy [facility] for tasks assessed/performed             Past Medical History:  Diagnosis Date   Chronic kidney disease    Edema    RLE   Hepatitis    Hep C   History of blood transfusion    Hypertension    Wears dentures     Past Surgical History:  Procedure Laterality Date   A/V FISTULAGRAM Left 04/11/2019   Procedure: A/V FISTULAGRAM;  Surgeon: Katha Cabal, MD;  Location: [place] CV LAB;  Service: Cardiovascular;  Laterality: Left;   A/V FISTULAGRAM Left 05/01/2019   Procedure: A/V FISTULAGRAM;  Surgeon: Algernon Huxley, MD;  Location: [place] CV LAB;  Service: Cardiovascular;  Laterality: Left;   A/V FISTULAGRAM Left 01/15/2020   Procedure: A/V FISTULAGRAM;  Surgeon: Algernon Huxley, MD;  Location: [place] CV LAB;  Service: Cardiovascular;  Laterality: Left;   A/V FISTULAGRAM Left 04/22/2020   Procedure: A/V FISTULAGRAM;  Surgeon: Algernon Huxley, MD;  Location: [place] CV LAB;  Service: Cardiovascular;  Laterality: Left;   AV FISTULA PLACEMENT Left 06/16/2018   Procedure: Left Arm ARTERIOVENOUS (AV) FISTULA CREATION;  Surgeon: Marty Heck, MD;  Location: [place];  Service: Vascular;  Laterality: Left;    MULTIPLE TOOTH EXTRACTIONS     MYOMECTOMY      There were no vitals filed for this visit.   Subjective Assessment - 09/18/20 0805     Subjective Patient reports her neck and back was hurting at dialysis. No falls or LOB since last session    Pertinent History Patient has been having dialysis over a year and was having some problems with passing out due to BP regulation and was using a RW . Now she is not having the passing out symptoms. She has good days and bad days at the hospital. She is trying to get a kidney and the nurse thought she needed to have PT to have her use a RW. She is currently not using a RW. If she feels weak after dialysis she uses a scooter at the store if she needs it.    Currently in Pain? No/denies                  BP at start of session: 128/66    Standing by support bar: Standing with CGA next to support surface:  6" step: toe taps no UE support 20x each LE 6" step: lateral step up/down 12x each LE; frequent cues for foot placement and reduction of crossing of legs. 6" step up/down 10x each LE; cueing for sequencing   Airex  pad: static stand 30 seconds x 2 trials, noticeable trembling of ankles/LE's with fatigue and challenge to maintain stability Airex pad: horizontal head turns 30 seconds scanning room 10x ; cueing for arc of motion Airex pad: vertical head turns 30 seconds, cueing for arc of motion, noticeable sway with upward gaze increasing demand on ankle righting reaction musculature Airex pad 6" step modified tandem stance 2x 30 seconds each LE placement  GTB lateral stepping 10x each side  Single limb stance 30 seconds x 2 trials  Posterior lunge SUE support 10x each LE    Seated:  Seated hip add squeezes x 15 reps with 3 second holds with cues for muscle activation and ball placement Seated clamshells with GTB x 20x reps GTB hamstring curl against PT resistance 15x each LE  GTB marches 15x each LE, cue for decreased velocity for  improved muscle contraction Straight arm flexion for core stabilization 12x Heel toe raises 20x    Pt educated throughout session about proper posture and technique with exercises. Improved exercise technique, movement at target joints, use of target muscles after min to mod verbal, visual, tactile cues.   Patient is more fatigued this session than last session. Reports increased dizziness throughout her session requiring occasional rest breaks. She remains highly motivated throughout session despite her fatigue. Patient's posterior chain continues to be an area of weakness. The pt will benefit from further skilled PT to improve generalized strength, endurance, mobility and gait ability to increase ease and safety with ADLs.                  PT Education - 09/18/20 0755     Education Details exercise technique, body mechanics    Person(s) Educated Patient    Methods Explanation;Demonstration;Tactile cues;Verbal cues    Comprehension Verbalized understanding;Returned demonstration;Verbal cues required;Tactile cues required              PT Short Term Goals - 07/31/20 1057       PT SHORT TERM GOAL #1   Title Patient will be independent in home exercise program to improve strength/mobility for better functional independence with ADLs.    Baseline 4/27: unable to when she was having medication issues 6/29: hep compliant    Time 4    Period Weeks    Status Achieved    Target Date 06/26/20      PT SHORT TERM GOAL #2   Title Patient will increase BLE gross strength to 4+/5 as to improve functional strength for independent gait, increased standing tolerance and increased ADL ability.    Baseline 01/26: 4/5 4/27: see note 6/29: grossly 4/5    Time 4    Period Weeks    Status Partially Met    Target Date 08/28/20      PT SHORT TERM GOAL #3   Title Patient will ascend/descend 4 stairs without rail assist independently without loss of balance to improve ability to get  in/out of home.    Baseline 12/29: able to perform 4 stairs x3 with b/l rail today and no loss of balance    Time 8    Period Weeks    Status Achieved    Target Date 01/10/20               PT Long Term Goals - 08/28/20 0944       PT LONG TERM GOAL #1   Title Patient will increase six minute walk test distance to >1500 for progression to age norm community  ambulator and improve gait ability    Baseline 4/27: 960 ft with one seated rest break 6/29: deferred due to orthostatic hypotension; 7/13: 1180 ft    Time 8    Period Weeks    Status On-going    Target Date 09/25/20      PT LONG TERM GOAL #2   Title Patient will ascend/descend 4 stairs without rail assist independently without loss of balance to improve ability to get in/out of home.    Baseline see above 6/29:    Time 8    Period Weeks    Status Achieved      PT LONG TERM GOAL #3   Title Patient will increase 10 meter walk test to >1.30ms as to improve gait speed for better community ambulation and to reduce fall risk.    Baseline 12/13/19= .821mec, 01/26: 1.36 m/sec 4.27: >1.0    Time 8    Period Weeks    Status Achieved      PT LONG TERM GOAL #4   Title Patient (> 6070ears old) will complete five times sit to stand test in < 15 seconds indicating an increased LE strength and improved balance.    Baseline 01/26: 18s 4/27: 13.8 seconds no UE support    Time 8    Period Weeks    Status Achieved      PT LONG TERM GOAL #5   Title Patient will increase ABC scale score >80% to demonstrate better functional mobility and better confidence with ADLs    Baseline 01/26: 76% 4/27: 55% 6/29 6/29: 89%    Time 8    Period Weeks    Status Achieved      PT LONG TERM GOAL #6   Title Patient will increase FOTO score to equal to or greater than  85%   to demonstrate statistically significant improvement in mobility and quality of life.    Baseline 4/27: 61% 6/29: 72%    Time 8    Period Weeks    Status Partially Met     Target Date 09/25/20      PT LONG TERM GOAL #7   Title Patient will increase dynamic gait index score to >19/24 as to demonstrate reduced fall risk and improved dynamic gait balance for better safety with community/home ambulation.    Baseline 6/29: perform next session; 7/6: deferred d/t pt BP and dizziness; 7/27: 23/24    Time 8    Period Weeks    Status Achieved                   Plan - 09/18/20 0827     Clinical Impression Statement Patient is more fatigued this session than last session. Reports increased dizziness throughout her session requiring occasional rest breaks. She remains highly motivated throughout session despite her fatigue. Patient's posterior chain continues to be an area of weakness. The pt will benefit from further skilled PT to improve generalized strength, endurance, mobility and gait ability to increase ease and safety with ADLs.    Personal Factors and Comorbidities Comorbidity 1;Comorbidity 2;Comorbidity 3+    Comorbidities Patient has PMHx :Hyperparathyroidism, HTN Hep C: Anemia ESRD: She gets dialysis 3 x week. She follows with nephrology. [facility]    Examination-Activity Limitations Bed Mobility;Carry;Caring for Others;Toileting;Stand;Stairs;Locomotion Level;Squat;Dressing;Sit;Transfers    Examination-Participation Restrictions Cleaning;Community Activity;Driving;Meal Prep;Medication Management;Laundry;Interpersonal Relationship;Occupation;Personal Finances;Shop;Yard Work;Volunteer    Stability/Clinical Decision Making Stable/Uncomplicated    Rehab Potential  Good    PT Frequency 1x / week    PT Duration 8 weeks    PT Treatment/Interventions Therapeutic exercise;Therapeutic activities;Balance training;Neuromuscular re-education;Stair training;Gait training;Energy conservation;Visual/perceptual remediation/compensation;Patient/family education;ADLs/Self Care Home Management;Canalith  Repostioning;Cryotherapy;Electrical Stimulation;Biofeedback;Iontophoresis 52m/ml Dexamethasone;Moist Heat;Ultrasound;Passive range of motion;Dry needling;Splinting;Taping;Vestibular    PT Next Visit Plan strengthening, endurance    PT Home Exercise Plan no updates this session    Consulted and Agree with Plan of Care Patient             Patient will benefit from skilled therapeutic intervention in order to improve the following deficits and impairments:  Abnormal gait, Decreased activity tolerance, Difficulty walking, Cardiopulmonary status limiting activity, Decreased balance, Decreased mobility, Decreased strength, Dizziness, Impaired perceived functional ability, Improper body mechanics, Impaired sensation  Visit Diagnosis: Other abnormalities of gait and mobility  Muscle weakness (generalized)  Unsteadiness on feet     Problem List Patient Active Problem List   Diagnosis Date Noted   A-V fistula ([facility] 04/26/2019   ES[place] stage [place] disease) ([place] 10/26/2018   Secondary hyperparathyroidism of renal origin ([place] 09/06/2018   Anemia due to stage 5 chronic kidney disease ([place] 02/26/2018   Hepatitis C 02/15/2013   Essential hypertension, benign 12/26/2008    MJanna Arch PT, DPT  09/18/2020, 8:44 AM  [place] [address]Phone: [phone]  Fax:  [phone] Name: Kimberly CHRISTENSONMRN: [id number] of Birth: 1October 19, 1952

## 2020-09-19 DIAGNOSIS — D631 Anemia in chronic kidney disease: Secondary | ICD-10-CM | POA: Diagnosis not present

## 2020-09-19 DIAGNOSIS — N186 End stage renal disease: Secondary | ICD-10-CM | POA: Diagnosis not present

## 2020-09-19 DIAGNOSIS — Z992 Dependence on renal dialysis: Secondary | ICD-10-CM | POA: Diagnosis not present

## 2020-09-19 DIAGNOSIS — N2581 Secondary hyperparathyroidism of renal origin: Secondary | ICD-10-CM | POA: Diagnosis not present

## 2020-09-19 DIAGNOSIS — D689 Coagulation defect, unspecified: Secondary | ICD-10-CM | POA: Diagnosis not present

## 2020-09-19 DIAGNOSIS — D509 Iron deficiency anemia, unspecified: Secondary | ICD-10-CM | POA: Diagnosis not present

## 2020-09-21 DIAGNOSIS — Z992 Dependence on renal dialysis: Secondary | ICD-10-CM | POA: Diagnosis not present

## 2020-09-21 DIAGNOSIS — N186 End stage renal disease: Secondary | ICD-10-CM | POA: Diagnosis not present

## 2020-09-21 DIAGNOSIS — D631 Anemia in chronic kidney disease: Secondary | ICD-10-CM | POA: Diagnosis not present

## 2020-09-21 DIAGNOSIS — N2581 Secondary hyperparathyroidism of renal origin: Secondary | ICD-10-CM | POA: Diagnosis not present

## 2020-09-21 DIAGNOSIS — D509 Iron deficiency anemia, unspecified: Secondary | ICD-10-CM | POA: Diagnosis not present

## 2020-09-21 DIAGNOSIS — D689 Coagulation defect, unspecified: Secondary | ICD-10-CM | POA: Diagnosis not present

## 2020-09-24 DIAGNOSIS — N186 End stage renal disease: Secondary | ICD-10-CM | POA: Diagnosis not present

## 2020-09-24 DIAGNOSIS — D631 Anemia in chronic kidney disease: Secondary | ICD-10-CM | POA: Diagnosis not present

## 2020-09-24 DIAGNOSIS — D689 Coagulation defect, unspecified: Secondary | ICD-10-CM | POA: Diagnosis not present

## 2020-09-24 DIAGNOSIS — N2581 Secondary hyperparathyroidism of renal origin: Secondary | ICD-10-CM | POA: Diagnosis not present

## 2020-09-24 DIAGNOSIS — Z992 Dependence on renal dialysis: Secondary | ICD-10-CM | POA: Diagnosis not present

## 2020-09-24 DIAGNOSIS — D509 Iron deficiency anemia, unspecified: Secondary | ICD-10-CM | POA: Diagnosis not present

## 2020-09-25 ENCOUNTER — Other Ambulatory Visit: Payer: Self-pay

## 2020-09-25 ENCOUNTER — Ambulatory Visit: Payer: Medicare Other

## 2020-09-25 DIAGNOSIS — R2681 Unsteadiness on feet: Secondary | ICD-10-CM

## 2020-09-25 DIAGNOSIS — R2689 Other abnormalities of gait and mobility: Secondary | ICD-10-CM | POA: Diagnosis not present

## 2020-09-25 DIAGNOSIS — M6281 Muscle weakness (generalized): Secondary | ICD-10-CM

## 2020-09-25 NOTE — Therapy (Signed)
[place] MAIN [facility] SERVICES [address] [place], [place], [address] Phone: [phone]   Fax:  [phone]  Physical Therapy Treatment/Discharge/Physical Therapy Progress Note   Dates of reporting period  06/12/20   to   09/25/20   Patient Details  Name: Kimberly Frost MRN: [id number] Date of Birth: 1950/06/12 Referring Provider (PT): Elmarie Shiley   Encounter Date: 09/25/2020   PT End of Session - 09/25/20 0742     Visit Number 30    Number of Visits 38    Date for PT Re-Evaluation 09/25/20    Authorization Type UHC medicare    Authorization Time Period 10/10 PN 5/11;    PT Start Time 0759    PT Stop Time 0830    PT Time Calculation (min) 31 min    Equipment Utilized During Treatment Gait belt    Activity Tolerance Patient tolerated treatment well;Patient limited by fatigue    Behavior During Therapy WFL for tasks assessed/performed             Past Medical History:  Diagnosis Date   Chronic kidney disease    Edema    RLE   Hepatitis    Hep C   History of blood transfusion    Hypertension    Wears dentures     Past Surgical History:  Procedure Laterality Date   A/V FISTULAGRAM Left 04/11/2019   Procedure: A/V FISTULAGRAM;  Surgeon: Katha Cabal, MD;  Location: [place] CV LAB;  Service: Cardiovascular;  Laterality: Left;   A/V FISTULAGRAM Left 05/01/2019   Procedure: A/V FISTULAGRAM;  Surgeon: Algernon Huxley, MD;  Location: [place] CV LAB;  Service: Cardiovascular;  Laterality: Left;   A/V FISTULAGRAM Left 01/15/2020   Procedure: A/V FISTULAGRAM;  Surgeon: Algernon Huxley, MD;  Location: [place] CV LAB;  Service: Cardiovascular;  Laterality: Left;   A/V FISTULAGRAM Left 04/22/2020   Procedure: A/V FISTULAGRAM;  Surgeon: Algernon Huxley, MD;  Location: [place] CV LAB;  Service: Cardiovascular;  Laterality: Left;   AV FISTULA PLACEMENT Left 06/16/2018   Procedure: Left Arm ARTERIOVENOUS (AV) FISTULA  CREATION;  Surgeon: Marty Heck, MD;  Location: [place];  Service: Vascular;  Laterality: Left;   MULTIPLE TOOTH EXTRACTIONS     MYOMECTOMY      There were no vitals filed for this visit.   Subjective Assessment - 09/25/20 0802     Subjective Patient reports her blood pressure medication has been changed again yesterday. Has been compliant with HEP    Pertinent History Patient has been having dialysis over a year and was having some problems with passing out due to BP regulation and was using a RW . Now she is not having the passing out symptoms. She has good days and bad days at the hospital. She is trying to get a kidney and the nurse thought she needed to have PT to have her use a RW. She is currently not using a RW. If she feels weak after dialysis she uses a scooter at the store if she needs it.    Currently in Pain? No/denies                [facility] PT Assessment - 09/25/20 0001       Functional Gait  Assessment   Gait assessed  Yes    Gait Level Surface Walks 20 ft in less than 5.5 sec, no assistive devices, good speed, no evidence for imbalance, normal gait  pattern, deviates no more than 6 in outside of the 12 in walkway width.    Change in Gait Speed Able to smoothly change walking speed without loss of balance or gait deviation. Deviate no more than 6 in outside of the 12 in walkway width.    Gait with Horizontal Head Turns Performs head turns smoothly with no change in gait. Deviates no more than 6 in outside 12 in walkway width    Gait with Vertical Head Turns Performs head turns with no change in gait. Deviates no more than 6 in outside 12 in walkway width.    Gait and Pivot Turn Pivot turns safely within 3 sec and stops quickly with no loss of balance.    Step Over Obstacle Is able to step over 2 stacked shoe boxes taped together (9 in total height) without changing gait speed. No evidence of imbalance.    Gait with Narrow Base of Support Ambulates 7-9 steps.    Gait  with Eyes Closed Walks 20 ft, no assistive devices, good speed, no evidence of imbalance, normal gait pattern, deviates no more than 6 in outside 12 in walkway width. Ambulates 20 ft in less than 7 sec.    Ambulating Backwards Walks 20 ft, uses assistive device, slower speed, mild gait deviations, deviates 6-10 in outside 12 in walkway width.    Steps Alternating feet, must use rail.    Total Score 27             Patient reports her blood pressure medication has been changed again yesterday.    BP at start of session:  129/68  Goals:   BLE strength: 4+/5  6 min walk test: 1500 ft ; BP after 143/66  FOTO: 82%   Other assessment FGA: 27/30    education on continuation of HEP and exercise compliance after discharge. Patient verbalized understanding.    Patient's condition has the potential to improve in response to therapy. Maximum improvement is yet to be obtained. The anticipated improvement is attainable and reasonable in a generally predictable time.  Patient reports she is able to do everything independently. Is very motivated.   Patient has met all goals and has demonstrated excellent motivation for progression. At this time patient is appropriate for discharge as she is independent with all mobility and stability. I will be happy to see this patient again in the future as needed.                    PT Education - 09/25/20 0741     Education Details exercise technique, body mechanics    Person(s) Educated Patient    Methods Explanation;Demonstration;Tactile cues;Verbal cues    Comprehension Verbalized understanding;Returned demonstration;Verbal cues required;Tactile cues required              PT Short Term Goals - 09/25/20 0934       PT SHORT TERM GOAL #1   Title Patient will be independent in home exercise program to improve strength/mobility for better functional independence with ADLs.    Baseline 4/27: unable to when she was having medication  issues 6/29: hep compliant    Time 4    Period Weeks    Status Achieved    Target Date 06/26/20      PT SHORT TERM GOAL #2   Title Patient will increase BLE gross strength to 4+/5 as to improve functional strength for independent gait, increased standing tolerance and increased ADL ability.    Baseline 01/26:  4/5 4/27: see note 6/29: grossly 4/5 8/24: 4+/5    Time 4    Period Weeks    Status Partially Met    Target Date 08/28/20      PT SHORT TERM GOAL #3   Title Patient will ascend/descend 4 stairs without rail assist independently without loss of balance to improve ability to get in/out of home.    Baseline 12/29: able to perform 4 stairs x3 with b/l rail today and no loss of balance    Time 8    Period Weeks    Status Achieved    Target Date 01/10/20               PT Long Term Goals - 09/25/20 0832       PT LONG TERM GOAL #1   Title Patient will increase six minute walk test distance to >1500 for progression to age norm community ambulator and improve gait ability    Baseline 4/27: 960 ft with one seated rest break 6/29: deferred due to orthostatic hypotension; 7/13: 1180 ft 8/24: 1500 ft    Time 8    Period Weeks    Status Achieved      PT LONG TERM GOAL #2   Title Patient will ascend/descend 4 stairs without rail assist independently without loss of balance to improve ability to get in/out of home.    Baseline see above 6/29:    Time 8    Period Weeks    Status Achieved      PT LONG TERM GOAL #3   Title Patient will increase 10 meter walk test to >1.77ms as to improve gait speed for better community ambulation and to reduce fall risk.    Baseline 12/13/19= .885mec, 01/26: 1.36 m/sec 4.27: >1.0    Time 8    Period Weeks    Status Achieved      PT LONG TERM GOAL #4   Title Patient (> 6043ears old) will complete five times sit to stand test in < 15 seconds indicating an increased LE strength and improved balance.    Baseline 01/26: 18s 4/27: 13.8 seconds no  UE support    Time 8    Period Weeks    Status Achieved      PT LONG TERM GOAL #5   Title Patient will increase ABC scale score >80% to demonstrate better functional mobility and better confidence with ADLs    Baseline 01/26: 76% 4/27: 55% 6/29 6/29: 89%    Time 8    Period Weeks    Status Achieved      PT LONG TERM GOAL #6   Title Patient will increase FOTO score to equal to or greater than  85%   to demonstrate statistically significant improvement in mobility and quality of life.    Baseline 4/27: 61% 6/29: 72% 8/24: 82%    Time 8    Period Weeks    Status Partially Met      PT LONG TERM GOAL #7   Title Patient will increase dynamic gait index score to >19/24 as to demonstrate reduced fall risk and improved dynamic gait balance for better safety with community/home ambulation.    Baseline 6/29: perform next session; 7/6: deferred d/t pt BP and dizziness; 7/27: 23/24    Time 8    Period Weeks    Status Achieved                   Plan - 09/25/20 [id number]  Clinical Impression Statement Patient has met all goals and has demonstrated excellent motivation for progression. At this time patient is appropriate for discharge as she is independent with all mobility and stability. I will be happy to see this patient again in the future as needed.    Personal Factors and Comorbidities Comorbidity 1;Comorbidity 2;Comorbidity 3+    Comorbidities Patient has PMHx :Hyperparathyroidism, HTN Hep C: Anemia ESRD: She gets dialysis 3 x week. She follows with nephrology. She is currently waiting to be [facility] at [facility] for a kidney [facility].    Examination-Activity Limitations Bed Mobility;Carry;Caring for Others;Toileting;Stand;Stairs;Locomotion Level;Squat;Dressing;Sit;Transfers    Examination-Participation Restrictions Cleaning;Community Activity;Driving;Meal Prep;Medication Management;Laundry;Interpersonal Relationship;Occupation;Personal Finances;Shop;Yard Work;Volunteer     Stability/Clinical Decision Making Stable/Uncomplicated    Rehab Potential Good    PT Frequency 1x / week    PT Duration 8 weeks    PT Treatment/Interventions Therapeutic exercise;Therapeutic activities;Balance training;Neuromuscular re-education;Stair training;Gait training;Energy conservation;Visual/perceptual remediation/compensation;Patient/family education;ADLs/Self Care Home Management;Canalith Repostioning;Cryotherapy;Electrical Stimulation;Biofeedback;Iontophoresis 78m/ml Dexamethasone;Moist Heat;Ultrasound;Passive range of motion;Dry needling;Splinting;Taping;Vestibular    PT Home Exercise Plan no updates this session    Consulted and Agree with Plan of Care Patient             Patient will benefit from skilled therapeutic intervention in order to improve the following deficits and impairments:  Abnormal gait, Decreased activity tolerance, Difficulty walking, Cardiopulmonary status limiting activity, Decreased balance, Decreased mobility, Decreased strength, Dizziness, Impaired perceived functional ability, Improper body mechanics, Impaired sensation  Visit Diagnosis: Other abnormalities of gait and mobility  Muscle weakness (generalized)  Unsteadiness on feet     Problem List Patient Active Problem List   Diagnosis Date Noted   A-V fistula ([facility] 04/26/2019   ESRD (end stage renal disease) ([place] 10/26/2018   Secondary hyperparathyroidism of renal origin ([place] 09/06/2018   Anemia due to stage 5 chronic kidney disease ([place] 02/26/2018   Hepatitis C 02/15/2013   Essential hypertension, benign 12/26/2008    MJanna Arch PT, DPT  09/25/2020, 9:35 AM  [place]MAIN RChrist[facility] CenterSERVICES [address]RSou[place] [address]Phone: [phone]  Fax:  [phone] Name: Kimberly NUCCIMRN: [id number] of Birth: [birth year]

## 2020-09-26 DIAGNOSIS — D631 Anemia in chronic kidney disease: Secondary | ICD-10-CM | POA: Diagnosis not present

## 2020-09-26 DIAGNOSIS — D509 Iron deficiency anemia, unspecified: Secondary | ICD-10-CM | POA: Diagnosis not present

## 2020-09-26 DIAGNOSIS — N186 End stage renal disease: Secondary | ICD-10-CM | POA: Diagnosis not present

## 2020-09-26 DIAGNOSIS — Z992 Dependence on renal dialysis: Secondary | ICD-10-CM | POA: Diagnosis not present

## 2020-09-26 DIAGNOSIS — D689 Coagulation defect, unspecified: Secondary | ICD-10-CM | POA: Diagnosis not present

## 2020-09-26 DIAGNOSIS — N2581 Secondary hyperparathyroidism of renal origin: Secondary | ICD-10-CM | POA: Diagnosis not present

## 2020-09-28 DIAGNOSIS — Z992 Dependence on renal dialysis: Secondary | ICD-10-CM | POA: Diagnosis not present

## 2020-09-28 DIAGNOSIS — N2581 Secondary hyperparathyroidism of renal origin: Secondary | ICD-10-CM | POA: Diagnosis not present

## 2020-09-28 DIAGNOSIS — D689 Coagulation defect, unspecified: Secondary | ICD-10-CM | POA: Diagnosis not present

## 2020-09-28 DIAGNOSIS — D509 Iron deficiency anemia, unspecified: Secondary | ICD-10-CM | POA: Diagnosis not present

## 2020-09-28 DIAGNOSIS — D631 Anemia in chronic kidney disease: Secondary | ICD-10-CM | POA: Diagnosis not present

## 2020-09-28 DIAGNOSIS — N186 End stage renal disease: Secondary | ICD-10-CM | POA: Diagnosis not present

## 2020-09-30 ENCOUNTER — Encounter: Payer: Medicare Other | Admitting: Adult Health

## 2020-10-01 DIAGNOSIS — N186 End stage renal disease: Secondary | ICD-10-CM | POA: Diagnosis not present

## 2020-10-01 DIAGNOSIS — D509 Iron deficiency anemia, unspecified: Secondary | ICD-10-CM | POA: Diagnosis not present

## 2020-10-01 DIAGNOSIS — D689 Coagulation defect, unspecified: Secondary | ICD-10-CM | POA: Diagnosis not present

## 2020-10-01 DIAGNOSIS — D631 Anemia in chronic kidney disease: Secondary | ICD-10-CM | POA: Diagnosis not present

## 2020-10-01 DIAGNOSIS — N2581 Secondary hyperparathyroidism of renal origin: Secondary | ICD-10-CM | POA: Diagnosis not present

## 2020-10-01 DIAGNOSIS — Z992 Dependence on renal dialysis: Secondary | ICD-10-CM | POA: Diagnosis not present

## 2020-10-02 ENCOUNTER — Encounter: Payer: Medicare Other | Admitting: Adult Health

## 2020-10-02 ENCOUNTER — Ambulatory Visit: Payer: Medicare Other

## 2020-10-02 DIAGNOSIS — Z992 Dependence on renal dialysis: Secondary | ICD-10-CM | POA: Diagnosis not present

## 2020-10-02 DIAGNOSIS — I129 Hypertensive chronic kidney disease with stage 1 through stage 4 chronic kidney disease, or unspecified chronic kidney disease: Secondary | ICD-10-CM | POA: Diagnosis not present

## 2020-10-02 DIAGNOSIS — N186 End stage renal disease: Secondary | ICD-10-CM | POA: Diagnosis not present

## 2020-10-03 DIAGNOSIS — D631 Anemia in chronic kidney disease: Secondary | ICD-10-CM | POA: Diagnosis not present

## 2020-10-03 DIAGNOSIS — D689 Coagulation defect, unspecified: Secondary | ICD-10-CM | POA: Diagnosis not present

## 2020-10-03 DIAGNOSIS — N2581 Secondary hyperparathyroidism of renal origin: Secondary | ICD-10-CM | POA: Diagnosis not present

## 2020-10-03 DIAGNOSIS — Z992 Dependence on renal dialysis: Secondary | ICD-10-CM | POA: Diagnosis not present

## 2020-10-03 DIAGNOSIS — N186 End stage renal disease: Secondary | ICD-10-CM | POA: Diagnosis not present

## 2020-10-03 DIAGNOSIS — D509 Iron deficiency anemia, unspecified: Secondary | ICD-10-CM | POA: Diagnosis not present

## 2020-10-05 DIAGNOSIS — N186 End stage renal disease: Secondary | ICD-10-CM | POA: Diagnosis not present

## 2020-10-05 DIAGNOSIS — Z992 Dependence on renal dialysis: Secondary | ICD-10-CM | POA: Diagnosis not present

## 2020-10-05 DIAGNOSIS — D689 Coagulation defect, unspecified: Secondary | ICD-10-CM | POA: Diagnosis not present

## 2020-10-05 DIAGNOSIS — D509 Iron deficiency anemia, unspecified: Secondary | ICD-10-CM | POA: Diagnosis not present

## 2020-10-05 DIAGNOSIS — D631 Anemia in chronic kidney disease: Secondary | ICD-10-CM | POA: Diagnosis not present

## 2020-10-05 DIAGNOSIS — N2581 Secondary hyperparathyroidism of renal origin: Secondary | ICD-10-CM | POA: Diagnosis not present

## 2020-10-08 DIAGNOSIS — D509 Iron deficiency anemia, unspecified: Secondary | ICD-10-CM | POA: Diagnosis not present

## 2020-10-08 DIAGNOSIS — D631 Anemia in chronic kidney disease: Secondary | ICD-10-CM | POA: Diagnosis not present

## 2020-10-08 DIAGNOSIS — N186 End stage renal disease: Secondary | ICD-10-CM | POA: Diagnosis not present

## 2020-10-08 DIAGNOSIS — N2581 Secondary hyperparathyroidism of renal origin: Secondary | ICD-10-CM | POA: Diagnosis not present

## 2020-10-08 DIAGNOSIS — Z992 Dependence on renal dialysis: Secondary | ICD-10-CM | POA: Diagnosis not present

## 2020-10-08 DIAGNOSIS — D689 Coagulation defect, unspecified: Secondary | ICD-10-CM | POA: Diagnosis not present

## 2020-10-09 ENCOUNTER — Ambulatory Visit: Payer: Medicare Other

## 2020-10-10 DIAGNOSIS — D631 Anemia in chronic kidney disease: Secondary | ICD-10-CM | POA: Diagnosis not present

## 2020-10-10 DIAGNOSIS — D689 Coagulation defect, unspecified: Secondary | ICD-10-CM | POA: Diagnosis not present

## 2020-10-10 DIAGNOSIS — Z992 Dependence on renal dialysis: Secondary | ICD-10-CM | POA: Diagnosis not present

## 2020-10-10 DIAGNOSIS — N2581 Secondary hyperparathyroidism of renal origin: Secondary | ICD-10-CM | POA: Diagnosis not present

## 2020-10-10 DIAGNOSIS — N186 End stage renal disease: Secondary | ICD-10-CM | POA: Diagnosis not present

## 2020-10-10 DIAGNOSIS — D509 Iron deficiency anemia, unspecified: Secondary | ICD-10-CM | POA: Diagnosis not present

## 2020-10-12 DIAGNOSIS — N2581 Secondary hyperparathyroidism of renal origin: Secondary | ICD-10-CM | POA: Diagnosis not present

## 2020-10-12 DIAGNOSIS — Z992 Dependence on renal dialysis: Secondary | ICD-10-CM | POA: Diagnosis not present

## 2020-10-12 DIAGNOSIS — D509 Iron deficiency anemia, unspecified: Secondary | ICD-10-CM | POA: Diagnosis not present

## 2020-10-12 DIAGNOSIS — D689 Coagulation defect, unspecified: Secondary | ICD-10-CM | POA: Diagnosis not present

## 2020-10-12 DIAGNOSIS — N186 End stage renal disease: Secondary | ICD-10-CM | POA: Diagnosis not present

## 2020-10-12 DIAGNOSIS — D631 Anemia in chronic kidney disease: Secondary | ICD-10-CM | POA: Diagnosis not present

## 2020-10-15 DIAGNOSIS — Z992 Dependence on renal dialysis: Secondary | ICD-10-CM | POA: Diagnosis not present

## 2020-10-15 DIAGNOSIS — D631 Anemia in chronic kidney disease: Secondary | ICD-10-CM | POA: Diagnosis not present

## 2020-10-15 DIAGNOSIS — D509 Iron deficiency anemia, unspecified: Secondary | ICD-10-CM | POA: Diagnosis not present

## 2020-10-15 DIAGNOSIS — N186 End stage renal disease: Secondary | ICD-10-CM | POA: Diagnosis not present

## 2020-10-15 DIAGNOSIS — D689 Coagulation defect, unspecified: Secondary | ICD-10-CM | POA: Diagnosis not present

## 2020-10-15 DIAGNOSIS — N2581 Secondary hyperparathyroidism of renal origin: Secondary | ICD-10-CM | POA: Diagnosis not present

## 2020-10-16 ENCOUNTER — Encounter (HOSPITAL_COMMUNITY): Payer: Self-pay

## 2020-10-16 ENCOUNTER — Ambulatory Visit: Payer: Medicare Other

## 2020-10-17 DIAGNOSIS — N2581 Secondary hyperparathyroidism of renal origin: Secondary | ICD-10-CM | POA: Diagnosis not present

## 2020-10-17 DIAGNOSIS — N186 End stage renal disease: Secondary | ICD-10-CM | POA: Diagnosis not present

## 2020-10-17 DIAGNOSIS — D631 Anemia in chronic kidney disease: Secondary | ICD-10-CM | POA: Diagnosis not present

## 2020-10-17 DIAGNOSIS — D509 Iron deficiency anemia, unspecified: Secondary | ICD-10-CM | POA: Diagnosis not present

## 2020-10-17 DIAGNOSIS — D689 Coagulation defect, unspecified: Secondary | ICD-10-CM | POA: Diagnosis not present

## 2020-10-17 DIAGNOSIS — Z992 Dependence on renal dialysis: Secondary | ICD-10-CM | POA: Diagnosis not present

## 2020-10-19 DIAGNOSIS — D631 Anemia in chronic kidney disease: Secondary | ICD-10-CM | POA: Diagnosis not present

## 2020-10-19 DIAGNOSIS — N186 End stage renal disease: Secondary | ICD-10-CM | POA: Diagnosis not present

## 2020-10-19 DIAGNOSIS — Z992 Dependence on renal dialysis: Secondary | ICD-10-CM | POA: Diagnosis not present

## 2020-10-19 DIAGNOSIS — D509 Iron deficiency anemia, unspecified: Secondary | ICD-10-CM | POA: Diagnosis not present

## 2020-10-19 DIAGNOSIS — D689 Coagulation defect, unspecified: Secondary | ICD-10-CM | POA: Diagnosis not present

## 2020-10-19 DIAGNOSIS — N2581 Secondary hyperparathyroidism of renal origin: Secondary | ICD-10-CM | POA: Diagnosis not present

## 2020-10-22 DIAGNOSIS — N186 End stage renal disease: Secondary | ICD-10-CM | POA: Diagnosis not present

## 2020-10-22 DIAGNOSIS — D509 Iron deficiency anemia, unspecified: Secondary | ICD-10-CM | POA: Diagnosis not present

## 2020-10-22 DIAGNOSIS — D689 Coagulation defect, unspecified: Secondary | ICD-10-CM | POA: Diagnosis not present

## 2020-10-22 DIAGNOSIS — Z992 Dependence on renal dialysis: Secondary | ICD-10-CM | POA: Diagnosis not present

## 2020-10-22 DIAGNOSIS — D631 Anemia in chronic kidney disease: Secondary | ICD-10-CM | POA: Diagnosis not present

## 2020-10-22 DIAGNOSIS — N2581 Secondary hyperparathyroidism of renal origin: Secondary | ICD-10-CM | POA: Diagnosis not present

## 2020-10-23 ENCOUNTER — Ambulatory Visit: Payer: Medicare Other

## 2020-10-23 ENCOUNTER — Encounter: Payer: Self-pay | Admitting: Gastroenterology

## 2020-10-23 ENCOUNTER — Ambulatory Visit (INDEPENDENT_AMBULATORY_CARE_PROVIDER_SITE_OTHER): Payer: Medicare Other | Admitting: Gastroenterology

## 2020-10-23 ENCOUNTER — Other Ambulatory Visit: Payer: Self-pay

## 2020-10-23 VITALS — BP 115/77 | HR 96 | Temp 98.7°F | Wt 135.0 lb

## 2020-10-23 DIAGNOSIS — R112 Nausea with vomiting, unspecified: Secondary | ICD-10-CM | POA: Diagnosis not present

## 2020-10-23 DIAGNOSIS — Z1211 Encounter for screening for malignant neoplasm of colon: Secondary | ICD-10-CM | POA: Diagnosis not present

## 2020-10-23 NOTE — Progress Notes (Signed)
Kimberly Antigua, MD [address]  [place]  [place], [place] [address]  Main: [phone]  Fax: [phone]   Primary Care Physician: [name], NP   Chief Complaint  Patient presents with   Follow-up    Pt reports she could not tolerate prep previously including Sutabs...    HPI: Kimberly Frost is a 70 y.o. female who was referred to [place] for screening colonoscopy and has had to cancel multiple times due to inability to tolerate prep due to nausea vomiting and this visit was made to discuss options with her further.  Patient has had nausea vomiting with Su tabs.  States that she goes to dialysis and after dialysis, she does not do well with the prep and therefore has nausea vomiting with any prep.  She is specifically requesting MiraLAX with Gatorade prep.  She has never had a screening colonoscopy before.  The patient denies abdominal or flank pain, anorexia, nausea or vomiting, dysphagia, change in bowel habits or black or bloody stools or weight loss.   ROS: All ROS reviewed and negative except as per HPI   Past Medical History:  Diagnosis Date   Chronic kidney disease    Edema    RLE   Hepatitis    Hep C   History of blood transfusion    Hypertension    Wears dentures     Past Surgical History:  Procedure Laterality Date   A/V FISTULAGRAM Left 04/11/2019   Procedure: A/V FISTULAGRAM;  Surgeon: Katha Cabal, MD;  Location: [facility];  Service: Cardiovascular;  Laterality: Left;   A/V FISTULAGRAM Left 05/01/2019   Procedure: A/V FISTULAGRAM;  Surgeon: Algernon Huxley, MD;  Location: [facility];  Service: Cardiovascular;  Laterality: Left;   A/V FISTULAGRAM Left 01/15/2020   Procedure: A/V FISTULAGRAM;  Surgeon: Algernon Huxley, MD;  Location: [facility];  Service: Cardiovascular;  Laterality: Left;   A/V FISTULAGRAM Left 04/22/2020   Procedure: A/V FISTULAGRAM;  Surgeon: Algernon Huxley, MD;  Location: [facility];   Service: Cardiovascular;  Laterality: Left;   AV FISTULA PLACEMENT Left 06/16/2018   Procedure: Left Arm ARTERIOVENOUS (AV) FISTULA CREATION;  Surgeon: Marty Heck, MD;  Location: [facility];  Service: Vascular;  Laterality: Left;   MULTIPLE TOOTH EXTRACTIONS     MYOMECTOMY      Prior to Admission medications   Medication Sig Start Date End Date Taking? Authorizing Provider  acetaminophen (TYLENOL) 500 MG tablet Take 1,000 mg by mouth every 6 (six) hours as needed for moderate pain or headache.   Yes [provider]  atorvastatin (LIPITOR) 20 MG tablet TAKE 1 TABLET BY MOUTH EVERY DAY 07/12/20  Yes Dutch Quint B, FNP  Etelcalcetide HCl (PARSABIV IV) 1 mg 3 (three) times a week. 04/23/20 04/22/21 Yes [provider]  furosemide (LASIX) 40 MG tablet Take 40 mg by mouth 2 (two) times daily. 12/07/19  Yes [provider]  midodrine (PROAMATINE) 10 MG tablet Take by mouth. 12/14/19  Yes [provider]  multivitamin (RENA-VIT) TABS tablet Take 1 tablet by mouth daily. 01/06/19  Yes [provider]  Sodium Sulfate-Mag Sulfate-KCl (SUTAB) [phone] MG TABS At 5 PM take 12 tablets using the 8 oz cup provided in the kit drinking 5 cups of water and 5 hours before your procedure repeat the same process. 12/26/19  Yes Virgel Manifold, MD  VELPHORO 500 MG chewable tablet Chew 1,000 mg  by mouth See admin instructions. Take 1000 mg with meals and 500 mg with snacks 01/19/19  Yes [provider]    Family History  Problem Relation Age of Onset   Diabetes Mother    Hypertension Mother    Breast cancer Mother 59   Bone cancer Father    Kidney cancer Maternal Grandmother    Stroke Brother    Diabetes Brother    Breast cancer Maternal Aunt 50   Breast cancer Maternal Aunt 70     Social History   Tobacco Use   Smoking status: Former    Types: Cigarettes   Smokeless tobacco: Never  Vaping Use   Vaping Use: Never used  Substance Use  Topics   Alcohol use: Not Currently    Alcohol/week: 6.0 standard drinks    Types: 6 Cans of beer per week   Drug use: Not Currently    Types: Marijuana    Comment: occasional     Allergies as of 10/23/2020   (No Known Allergies)    Physical Examination:  Constitutional: General:   Alert,  Well-developed, well-nourished, pleasant and cooperative in NAD BP 115/77   Pulse 96   Temp 98.7 F (37.1 C) (Oral)   Wt 135 lb (61.2 kg)   BMI 21.79 kg/m   Respiratory: Normal respiratory effort  Gastrointestinal:  Soft, non-tender and non-distended without masses, hepatosplenomegaly or hernias noted.  No guarding or rebound tenderness.     Cardiac: No clubbing or edema.  No cyanosis. Normal posterior tibial pedal pulses noted.  Psych:  Alert and cooperative. Normal mood and affect.  Musculoskeletal:  Normal gait. Head normocephalic, atraumatic. Symmetrical without gross deformities. 5/5 Lower extremity strength bilaterally.  Skin: Warm. Intact without significant lesions or rashes. No jaundice.  Neck: Supple, trachea midline  Lymph: No cervical lymphadenopathy  Psych:  Alert and oriented x3, Alert and cooperative. Normal mood and affect.  Labs: CMP     Component Value Date/Time   NA 137 11/01/2019 0856   NA 138 06/23/2018 0000   K 4.3 11/01/2019 0856   K 4.9 06/23/2018 0000   CL 91 (L) 11/01/2019 0856   CO2 33 (H) 11/01/2019 0856   GLUCOSE 158 (H) 11/01/2019 0856   BUN 27 (H) 11/01/2019 0856   BUN 61 06/23/2018 0000   CREATININE 8.20 (HH) 11/01/2019 0856   CREATININE 7.13 06/23/2018 0000   CALCIUM 9.9 11/01/2019 0856   CALCIUM 8.8 06/23/2018 0000   PROT 8.0 11/01/2019 0856   ALBUMIN 4.2 11/01/2019 0856   AST 22 11/01/2019 0856   ALT 18 11/01/2019 0856   ALKPHOS 77 11/01/2019 0856   BILITOT 0.5 11/01/2019 0856   GFRNONAA 9 (L) 02/27/2018 0533   GFRAA 6 06/23/2018 0000   Lab Results  Component Value Date   WBC 8.3 07/31/2019   HGB 10.3 (L) 07/31/2019   HCT  30.9 (L) 07/31/2019   MCV 87.7 07/31/2019   PLT 247.0 07/31/2019    Imaging Studies:   Assessment and Plan:   Kimberly Frost is a 70 y.o. y/o female who had been referred to [place] for screening colonoscopies which she has canceled multiple times due to inability to tolerate prep, including Su tabs due to nausea vomiting with all preps, with no prior colonoscopy  She does not have N/V usually or chronically but just with every prep she has tried as she attributes this to having to take the prep on her dialysis days.   Patient's main complaint is that she is  not able to drink prep after her dialysis as she does not have.  Her dialysis days are Tuesday Thursday Saturday.  She is specifically requesting MiraLAX with Gatorade prep and does not want to try any other prep as she has tried Su tabs, GoLytely, Suprep, and other preps as well  After thorough discussion, we have reached a conclusion that it is likely best for her to have a colonoscopy on a Monday, she does not go to dialysis on Sunday and feels well on that day (since colonoscopy can also not be done on dialysis days).  Therefore, she can consume the prep on Sunday and have the procedure on Monday.  She is requesting a follow-up appointment.  I discussed with her that I do not do Monday morning procedures, but she can be scheduled with one of my partners and she is comfortable with this.  Staff will look at all schedules, and schedule her for Monday morning with one of the providers.  We also discussed that MiraLAX with Gatorade prep can have limitations, but since she has not done well with any other prep and has nausea vomiting with all of them and is willing to only try Gatorade prep, reasonable to order this at this time.  I have discussed alternative options, risks & benefits,  which include, but are not limited to, bleeding, infection, perforation,respiratory complication & drug reaction.  The patient agrees with this plan & written  consent will be obtained.       Dr Kimberly Frost

## 2020-10-24 DIAGNOSIS — Z992 Dependence on renal dialysis: Secondary | ICD-10-CM | POA: Diagnosis not present

## 2020-10-24 DIAGNOSIS — D631 Anemia in chronic kidney disease: Secondary | ICD-10-CM | POA: Diagnosis not present

## 2020-10-24 DIAGNOSIS — D689 Coagulation defect, unspecified: Secondary | ICD-10-CM | POA: Diagnosis not present

## 2020-10-24 DIAGNOSIS — N186 End stage renal disease: Secondary | ICD-10-CM | POA: Diagnosis not present

## 2020-10-24 DIAGNOSIS — D509 Iron deficiency anemia, unspecified: Secondary | ICD-10-CM | POA: Diagnosis not present

## 2020-10-24 DIAGNOSIS — N2581 Secondary hyperparathyroidism of renal origin: Secondary | ICD-10-CM | POA: Diagnosis not present

## 2020-10-25 DIAGNOSIS — I951 Orthostatic hypotension: Secondary | ICD-10-CM | POA: Diagnosis not present

## 2020-10-25 DIAGNOSIS — E782 Mixed hyperlipidemia: Secondary | ICD-10-CM | POA: Diagnosis not present

## 2020-10-25 DIAGNOSIS — Z992 Dependence on renal dialysis: Secondary | ICD-10-CM | POA: Diagnosis not present

## 2020-10-25 DIAGNOSIS — N186 End stage renal disease: Secondary | ICD-10-CM | POA: Diagnosis not present

## 2020-10-25 DIAGNOSIS — M818 Other osteoporosis without current pathological fracture: Secondary | ICD-10-CM | POA: Diagnosis not present

## 2020-10-26 DIAGNOSIS — D631 Anemia in chronic kidney disease: Secondary | ICD-10-CM | POA: Diagnosis not present

## 2020-10-26 DIAGNOSIS — N2581 Secondary hyperparathyroidism of renal origin: Secondary | ICD-10-CM | POA: Diagnosis not present

## 2020-10-26 DIAGNOSIS — D689 Coagulation defect, unspecified: Secondary | ICD-10-CM | POA: Diagnosis not present

## 2020-10-26 DIAGNOSIS — Z992 Dependence on renal dialysis: Secondary | ICD-10-CM | POA: Diagnosis not present

## 2020-10-26 DIAGNOSIS — D509 Iron deficiency anemia, unspecified: Secondary | ICD-10-CM | POA: Diagnosis not present

## 2020-10-26 DIAGNOSIS — N186 End stage renal disease: Secondary | ICD-10-CM | POA: Diagnosis not present

## 2020-10-29 DIAGNOSIS — N2581 Secondary hyperparathyroidism of renal origin: Secondary | ICD-10-CM | POA: Diagnosis not present

## 2020-10-29 DIAGNOSIS — N186 End stage renal disease: Secondary | ICD-10-CM | POA: Diagnosis not present

## 2020-10-29 DIAGNOSIS — Z992 Dependence on renal dialysis: Secondary | ICD-10-CM | POA: Diagnosis not present

## 2020-10-29 DIAGNOSIS — D631 Anemia in chronic kidney disease: Secondary | ICD-10-CM | POA: Diagnosis not present

## 2020-10-29 DIAGNOSIS — D689 Coagulation defect, unspecified: Secondary | ICD-10-CM | POA: Diagnosis not present

## 2020-10-29 DIAGNOSIS — D509 Iron deficiency anemia, unspecified: Secondary | ICD-10-CM | POA: Diagnosis not present

## 2020-10-30 ENCOUNTER — Ambulatory Visit: Payer: Medicare Other

## 2020-10-31 DIAGNOSIS — Z992 Dependence on renal dialysis: Secondary | ICD-10-CM | POA: Diagnosis not present

## 2020-10-31 DIAGNOSIS — N186 End stage renal disease: Secondary | ICD-10-CM | POA: Diagnosis not present

## 2020-10-31 DIAGNOSIS — D509 Iron deficiency anemia, unspecified: Secondary | ICD-10-CM | POA: Diagnosis not present

## 2020-10-31 DIAGNOSIS — N2581 Secondary hyperparathyroidism of renal origin: Secondary | ICD-10-CM | POA: Diagnosis not present

## 2020-10-31 DIAGNOSIS — D631 Anemia in chronic kidney disease: Secondary | ICD-10-CM | POA: Diagnosis not present

## 2020-10-31 DIAGNOSIS — D689 Coagulation defect, unspecified: Secondary | ICD-10-CM | POA: Diagnosis not present

## 2020-11-01 DIAGNOSIS — I129 Hypertensive chronic kidney disease with stage 1 through stage 4 chronic kidney disease, or unspecified chronic kidney disease: Secondary | ICD-10-CM | POA: Diagnosis not present

## 2020-11-01 DIAGNOSIS — Z992 Dependence on renal dialysis: Secondary | ICD-10-CM | POA: Diagnosis not present

## 2020-11-01 DIAGNOSIS — N186 End stage renal disease: Secondary | ICD-10-CM | POA: Diagnosis not present

## 2020-11-06 ENCOUNTER — Ambulatory Visit: Payer: Medicare Other

## 2020-11-13 ENCOUNTER — Ambulatory Visit: Payer: Medicare Other

## 2020-11-13 ENCOUNTER — Telehealth: Payer: Self-pay | Admitting: Gastroenterology

## 2020-11-13 NOTE — Telephone Encounter (Signed)
Patient wants to reschedule procedure. Clinical staff will follow up with Patient.

## 2020-11-14 NOTE — Telephone Encounter (Signed)
Pt moved to Nov 7th as she had a death in the family and will not be able to make it for procedure  New PPW mailed to pt

## 2020-11-14 NOTE — Telephone Encounter (Signed)
Left message on voicemail Next available Monday appts Oct 31, Nov 7, Nov 14, Nov 21

## 2020-11-14 NOTE — Telephone Encounter (Signed)
Patient is calling back to reschedule her colonoscopy. Please call patient

## 2020-11-20 ENCOUNTER — Ambulatory Visit: Payer: Medicare Other

## 2020-11-27 ENCOUNTER — Ambulatory Visit: Payer: Medicare Other

## 2020-12-04 ENCOUNTER — Ambulatory Visit: Payer: Medicare Other

## 2020-12-04 ENCOUNTER — Telehealth: Payer: Self-pay

## 2020-12-04 ENCOUNTER — Encounter: Payer: Medicare Other | Admitting: Adult Health

## 2020-12-04 NOTE — Telephone Encounter (Signed)
Pt has further work up in reference to a recent lung mass find and between dialysis and following up with what is needed for the lung testing, she needs to post pone procedure at this time   Procedure canceled at this time

## 2020-12-09 ENCOUNTER — Encounter: Admission: RE | Payer: Self-pay | Source: Home / Self Care

## 2020-12-09 ENCOUNTER — Ambulatory Visit: Admission: RE | Admit: 2020-12-09 | Payer: Medicare Other | Source: Home / Self Care | Admitting: Gastroenterology

## 2020-12-09 SURGERY — COLONOSCOPY WITH PROPOFOL
Anesthesia: General

## 2020-12-11 ENCOUNTER — Ambulatory Visit: Payer: Medicare Other

## 2020-12-16 ENCOUNTER — Ambulatory Visit (INDEPENDENT_AMBULATORY_CARE_PROVIDER_SITE_OTHER): Payer: Medicare Other

## 2020-12-16 ENCOUNTER — Other Ambulatory Visit (INDEPENDENT_AMBULATORY_CARE_PROVIDER_SITE_OTHER): Payer: Self-pay | Admitting: Nurse Practitioner

## 2020-12-16 ENCOUNTER — Other Ambulatory Visit: Payer: Self-pay

## 2020-12-16 ENCOUNTER — Encounter (INDEPENDENT_AMBULATORY_CARE_PROVIDER_SITE_OTHER): Payer: Self-pay | Admitting: Vascular Surgery

## 2020-12-16 ENCOUNTER — Ambulatory Visit (INDEPENDENT_AMBULATORY_CARE_PROVIDER_SITE_OTHER): Payer: Medicare Other | Admitting: Vascular Surgery

## 2020-12-16 VITALS — BP 123/83 | HR 123 | Ht 66.0 in | Wt 132.0 lb

## 2020-12-16 DIAGNOSIS — E782 Mixed hyperlipidemia: Secondary | ICD-10-CM | POA: Diagnosis not present

## 2020-12-16 DIAGNOSIS — N186 End stage renal disease: Secondary | ICD-10-CM

## 2020-12-16 DIAGNOSIS — I10 Essential (primary) hypertension: Secondary | ICD-10-CM

## 2020-12-16 NOTE — Progress Notes (Signed)
MRN : [id number]  Kimberly Frost is a 70 y.o. (02/25/1950) female who presents with chief complaint of check access.  History of Present Illness:   The patient returns to the office for followup of their dialysis access. The function of the access has been stable. The patient denies increased bleeding time or increased recirculation. Patient denies difficulty with cannulation. The patient denies hand pain or other symptoms consistent with steal phenomena.  No significant arm swelling.  The patient denies redness or swelling at the access site. The patient denies fever or chills at home or while on dialysis.  The patient denies amaurosis fugax or recent TIA symptoms. There are no recent neurological changes noted. The patient denies claudication symptoms or rest pain symptoms. The patient denies history of DVT, PE or superficial thrombophlebitis. The patient denies recent episodes of angina or shortness of breath.         Current Meds  Medication Sig   acetaminophen (TYLENOL) 500 MG tablet Take 1,000 mg by mouth every 6 (six) hours as needed for moderate pain or headache.   atorvastatin (LIPITOR) 20 MG tablet TAKE 1 TABLET BY MOUTH EVERY DAY   Etelcalcetide HCl (PARSABIV IV) 1 mg 3 (three) times a week.   furosemide (LASIX) 40 MG tablet Take 40 mg by mouth 2 (two) times daily.   midodrine (PROAMATINE) 10 MG tablet Take by mouth.   multivitamin (RENA-VIT) TABS tablet Take 1 tablet by mouth daily.   Sodium Sulfate-Mag Sulfate-KCl (SUTAB) [phone] MG TABS At 5 PM take 12 tablets using the 8 oz cup provided in the kit drinking 5 cups of water and 5 hours before your procedure repeat the same process.   VELPHORO 500 MG chewable tablet Chew 1,000 mg by mouth See admin instructions. Take 1000 mg with meals and 500 mg with snacks    Past Medical History:  Diagnosis Date   Chronic kidney disease    Edema    RLE   Hepatitis    Hep C   History of blood transfusion    Hypertension     Wears dentures     Past Surgical History:  Procedure Laterality Date   A/V FISTULAGRAM Left 04/11/2019   Procedure: A/V FISTULAGRAM;  Surgeon: Katha Cabal, MD;  Location: [facility];  Service: Cardiovascular;  Laterality: Left;   A/V FISTULAGRAM Left 05/01/2019   Procedure: A/V FISTULAGRAM;  Surgeon: Algernon Huxley, MD;  Location: [facility];  Service: Cardiovascular;  Laterality: Left;   A/V FISTULAGRAM Left 01/15/2020   Procedure: A/V FISTULAGRAM;  Surgeon: Algernon Huxley, MD;  Location: [facility];  Service: Cardiovascular;  Laterality: Left;   A/V FISTULAGRAM Left 04/22/2020   Procedure: A/V FISTULAGRAM;  Surgeon: Algernon Huxley, MD;  Location: [facility];  Service: Cardiovascular;  Laterality: Left;   AV FISTULA PLACEMENT Left 06/16/2018   Procedure: Left Arm ARTERIOVENOUS (AV) FISTULA CREATION;  Surgeon: Marty Heck, MD;  Location: [facility];  Service: Vascular;  Laterality: Left;   MULTIPLE TOOTH EXTRACTIONS     MYOMECTOMY      Social History Social History   Tobacco Use   Smoking status: Former    Types: Cigarettes   Smokeless tobacco: Never  Vaping Use   Vaping Use: Never used  Substance Use Topics   Alcohol use: Not Currently    Alcohol/week: 6.0 standard drinks    Types: 6 Cans of beer per week   Drug use: Not  Currently    Types: Marijuana    Comment: occasional     Family History Family History  Problem Relation Age of Onset   Diabetes Mother    Hypertension Mother    Breast cancer Mother 100   Bone cancer Father    Kidney cancer Maternal Grandmother    Stroke Brother    Diabetes Brother    Breast cancer Maternal Aunt 30   Breast cancer Maternal Aunt 40    No Known Allergies   REVIEW OF SYSTEMS (Negative unless checked)  Constitutional: [] Weight loss  [] Fever  [] Chills Cardiac: [] Chest pain   [] Chest pressure   [] Palpitations   [] Shortness of breath when laying flat   [] Shortness of breath with  exertion. Vascular:  [] Pain in legs with walking   [] Pain in legs at rest  [] History of DVT   [] Phlebitis   [] Swelling in legs   [] Varicose veins   [] Non-healing ulcers Pulmonary:   [] Uses home oxygen   [] Productive cough   [] Hemoptysis   [] Wheeze  [] COPD   [] Asthma Neurologic:  [] Dizziness   [] Seizures   [] History of stroke   [] History of TIA  [] Aphasia   [] Vissual changes   [] Weakness or numbness in arm   [] Weakness or numbness in leg Musculoskeletal:   [] Joint swelling   [] Joint pain   [] Low back pain Hematologic:  [] Easy bruising  [] Easy bleeding   [] Hypercoagulable state   [] Anemic Gastrointestinal:  [] Diarrhea   [] Vomiting  [] Gastroesophageal reflux/heartburn   [] Difficulty swallowing. Genitourinary:  [x] Chronic kidney disease   [] Difficult urination  [] Frequent urination   [] Blood in urine Skin:  [] Rashes   [] Ulcers  Psychological:  [] History of anxiety   []  History of major depression.  Physical Examination  Vitals:   12/16/20 1516  BP: 123/83  Pulse: (!) 123  Weight: 132 lb (59.9 kg)  Height: 5' 6"  (1.676 m)   Body mass index is 21.31 kg/m. Gen: WD/WN, NAD Head: [place]/AT, No temporalis wasting.  Ear/Nose/Throat: Hearing grossly intact, nares w/o erythema or drainage Eyes: PER, EOMI, sclera nonicteric.  Neck: Supple, no gross masses or lesions.  No JVD.  Pulmonary:  Good air movement, no audible wheezing, no use of accessory muscles.  Cardiac: RRR, precordium non-hyperdynamic. Vascular:    left brachial cephalic fistula good thrill and good bruit Vessel Right Left  Radial Palpable Palpable  Brachial Palpable Palpable  Gastrointestinal: soft, non-distended. No guarding/no peritoneal signs.  Musculoskeletal: M/S 5/5 throughout.  No deformity.  Neurologic: CN 2-12 intact. Pain and light touch intact in extremities.  Symmetrical.  Speech is fluent. Motor exam as listed above. Psychiatric: Judgment intact, Mood & affect appropriate for pt's clinical situation. Dermatologic: No  rashes or ulcers noted.  No changes consistent with cellulitis.   CBC Lab Results  Component Value Date   WBC 8.3 07/31/2019   HGB 10.3 (L) 07/31/2019   HCT 30.9 (L) 07/31/2019   MCV 87.7 07/31/2019   PLT 247.0 07/31/2019    BMET    Component Value Date/Time   NA 137 11/01/2019 0856   NA 138 06/23/2018 0000   K 4.3 11/01/2019 0856   K 4.9 06/23/2018 0000   CL 91 (L) 11/01/2019 0856   CO2 33 (H) 11/01/2019 0856   GLUCOSE 158 (H) 11/01/2019 0856   BUN 27 (H) 11/01/2019 0856   BUN 61 06/23/2018 0000   CREATININE 8.20 (HH) 11/01/2019 0856   CREATININE 7.13 06/23/2018 0000   CALCIUM 9.9 11/01/2019 0856   CALCIUM 8.8 06/23/2018 0000  GFRNONAA 9 (L) 02/27/2018 0533   GFRAA 6 06/23/2018 0000   CrCl cannot be calculated (Patient's most recent lab result is older than the maximum 21 days allowed.).  COAG No results found for: INR, PROTIME  Radiology No results found.   Assessment/Plan 1. ESRD (end stage renal disease) ([place]) Recommend:  The patient is doing well and currently has adequate dialysis access. The patient's dialysis center is not reporting any major access issues.  She notes that there are a few people that have a difficult time but there are 2 people that never have any troubles whatsoever and she always has them get her access Flow pattern is improved when compared to the prior ultrasound.  The patient should have a duplex ultrasound of the dialysis access in 6 months. The patient will follow-up with me in the office after each ultrasound    - VAS [place] (AVF, AVG); Future  2. Essential hypertension, benign Continue antihypertensive medications as already ordered, these medications have been reviewed and there are no changes at this time.   3. Mixed hyperlipidemia Continue statin as ordered and reviewed, no changes at this time         Hortencia Pilar, MD  12/16/2020 3:31 PM

## 2020-12-17 ENCOUNTER — Encounter (INDEPENDENT_AMBULATORY_CARE_PROVIDER_SITE_OTHER): Payer: Self-pay | Admitting: Vascular Surgery

## 2020-12-17 DIAGNOSIS — E785 Hyperlipidemia, unspecified: Secondary | ICD-10-CM | POA: Insufficient documentation

## 2020-12-18 ENCOUNTER — Ambulatory Visit: Payer: Medicare Other

## 2020-12-25 ENCOUNTER — Ambulatory Visit: Payer: Medicare Other

## 2021-01-01 ENCOUNTER — Ambulatory Visit: Payer: Medicare Other

## 2021-01-07 DIAGNOSIS — C3491 Malignant neoplasm of unspecified part of right bronchus or lung: Secondary | ICD-10-CM | POA: Insufficient documentation

## 2021-01-08 ENCOUNTER — Ambulatory Visit: Payer: Medicare Other

## 2021-01-15 ENCOUNTER — Ambulatory Visit: Payer: Medicare Other

## 2021-01-21 ENCOUNTER — Telehealth: Payer: Self-pay | Admitting: Radiation Oncology

## 2021-01-21 ENCOUNTER — Other Ambulatory Visit: Payer: Self-pay | Admitting: Radiation Oncology

## 2021-01-21 ENCOUNTER — Ambulatory Visit
Admission: RE | Admit: 2021-01-21 | Discharge: 2021-01-21 | Disposition: A | Discharge status: Home / Self Care | Payer: Self-pay | Source: Ambulatory Visit | Attending: Radiation Oncology | Admitting: Radiation Oncology

## 2021-01-21 DIAGNOSIS — C3411 Malignant neoplasm of upper lobe, right bronchus or lung: Secondary | ICD-10-CM

## 2021-01-21 NOTE — Telephone Encounter (Signed)
Called patient to schedule consultation with Dr. Sondra Come. No answer, LVM for return call.

## 2021-01-22 ENCOUNTER — Ambulatory Visit: Payer: Medicare Other

## 2021-01-22 ENCOUNTER — Ambulatory Visit: Payer: Medicare Other | Admitting: Gastroenterology

## 2021-01-29 ENCOUNTER — Ambulatory Visit: Payer: Medicare Other

## 2021-02-03 ENCOUNTER — Encounter (HOSPITAL_COMMUNITY): Payer: Self-pay

## 2021-02-05 ENCOUNTER — Ambulatory Visit: Payer: Medicare Other

## 2021-02-05 DIAGNOSIS — C3491 Malignant neoplasm of unspecified part of right bronchus or lung: Secondary | ICD-10-CM | POA: Insufficient documentation

## 2021-02-05 NOTE — Progress Notes (Signed)
Location of tumor and Histology per Pathology Report: right upper lobe lung  Biopsy: 01/01/2021 Biopsy  RUL lobe mass: Fragments of NSCLC, favor adenocarcinoma. Foci of necrosis noted. Lymph node, mediastinal station 7, and 4R, 11 R: No carcinoma identified.  Past/Anticipated interventions by surgeon, if any:  did not feel that she would likely be a good surgical candidate due to her poor pulmonary function test as well as multiple medical comorbidities including CKD on dialysis  Past/Anticipated interventions by medical oncology, if any: Chemotherapy - patient declined    Pain issues, if any:  no   SAFETY ISSUES: Prior radiation? no Pacemaker/ICD? no Possible current pregnancy? no Is the patient on methotrexate? no  Current Complaints / other details:  none     Vitals:   02/10/21 1223  BP: 127/74  Pulse: (!) 102  Resp: 18  Temp: (!) 97.2 F (36.2 C)  TempSrc: Temporal  SpO2: 99%  Weight: 126 lb 6 oz (57.3 kg)  Height: 5\' 6"  (1.676 m)

## 2021-02-09 NOTE — Progress Notes (Signed)
Radiation Oncology         [phone] ________________________________  Initial Outpatient Consultation  Name: Kimberly Frost Frost MRN: [id number]  Date: 02/10/2021  DOB: 1950-08-01  FG:HWEX, [place], MD   REFERRING PHYSICIAN: [name], MD  DIAGNOSIS: Kimberly Frost primary encounter diagnosis was NSCLC of right lung [facility]). A diagnosis of NSCLC of lower lobe ([place]) was also pertinent to this visit.  Stage IB (cT2, N0, M0) NSCLC of Kimberly Frost RUL  HISTORY OF PRESENT ILLNESS::Kimberly Frost Frost is a 71 y.o. female who is accompanied by Kimberly Frost Frost. she is seen as a courtesy of Dr. Lurline Frost for an opinion concerning radiation therapy as part of management for Kimberly Frost recently diagnosed RUL mass.   Kimberly Frost Frost underwent a chest CT on 10/30/20 which demonstrated a right upper lobe mass highly suspicious for malignancy with spicules along Kimberly Frost superior margin, extending into Kimberly Frost pleural surface, and possibly showing lymphangitic spread. CT also showed a small endobronchial lesion in Kimberly Frost distal right upper lobe, noted as possibly reflective of an area of mucous vs an endobronchial lesion.   Kimberly Frost Frost underwent a RUL biopsy on 01/01/21 showing: NSCLC favoring adenocarcinoma, with a foci of necrosis. Biopsies of mediastinal station 7, 4R, and 11R lymph nodes showed no evidence of carcinoma.   MRI of Kimberly Frost brain on 01/15/21 showed no acute intracranial abnormalities or evidence of intracranial metastatic disease.   PET on 01/15/21 demonstrated Kimberly Frost large hypermetabolic right upper lobe mass lesion as compatible with pulmonary neoplasm. A slight increase in Kimberly Frost degree of surrounding ground glass opacity and consolidation of Kimberly Frost more superior right upper lobe was also seen, with minimal associated FDG uptake; noted to possible reflect postoperative pneumonitis versus condition of neoplastic disease. No suspicious mediastinal/hilar uptake, or sites of distant disease were appreciated otherwise.   Kimberly Frost  Frost was then referred to Dr. Lurline Frost on 01/17/21 to discuss radiation treatment options. Given Kimberly Frost location of Kimberly Frost RUL mass, Dr. Lurline Frost informed Kimberly Frost Frost that Kimberly Frost lesion would not likely be amendable to ultra hypofractionated course of radiation therapy due to Kimberly Frost high risk of bronchial injury. Kimberly Frost Frost did voice disinterest in pursuing any chemotherapy given family history of poor adverse effects to chemo in Kimberly Frost past. Per Dr. Daron Frost visit note, Kimberly Frost Frost's case was discussed at Kimberly Frost tumor board with Kimberly Frost attending surgeon, Dr. Lillia Frost, who indicated that Kimberly Frost Frost would likely not be a good surgical candidate due to Kimberly Frost poor pulmonary function testing (10/30/20) and multiple comorbidities, which include CKD requiring dialysis. Following discussion, Kimberly Frost Frost voiced interest in pursuing RT though requested receiving treatment closer to Kimberly Frost home. Dr. Lurline Frost accordingly referred Kimberly Frost Frost to me for consideration of RT.   PREVIOUS RADIATION THERAPY: No  PAST MEDICAL HISTORY:  Past Medical History:  Diagnosis Date   Chronic kidney disease    Edema    RLE   Hepatitis    Hep C   History of blood transfusion    Hypertension    Lung cancer ([place])    Wears dentures     PAST SURGICAL HISTORY: Past Surgical History:  Procedure Laterality Date   A/V FISTULAGRAM Left 04/11/2019   Procedure: A/V FISTULAGRAM;  Surgeon: Kimberly Cabal, MD;  Location: [place] [facility];  Service: Cardiovascular;  Laterality: Left;   A/V FISTULAGRAM Left 05/01/2019   Procedure: A/V FISTULAGRAM;  Surgeon: Kimberly Huxley, MD;  Location: [place] [facility];  Service: Cardiovascular;  Laterality: Left;   A/V FISTULAGRAM  Left 01/15/2020   Procedure: A/V FISTULAGRAM;  Surgeon: Kimberly Huxley, MD;  Location: [place] [facility];  Service: Cardiovascular;  Laterality: Left;   A/V FISTULAGRAM Left 04/22/2020   Procedure: A/V FISTULAGRAM;  Surgeon: Kimberly Huxley, MD;  Location: [place] [facility];   Service: Cardiovascular;  Laterality: Left;   AV FISTULA PLACEMENT Left 06/16/2018   Procedure: Left Arm ARTERIOVENOUS (AV) FISTULA CREATION;  Surgeon: Kimberly Heck, MD;  Location: [facility];  Service: Vascular;  Laterality: Left;   MULTIPLE TOOTH EXTRACTIONS     MYOMECTOMY      FAMILY HISTORY:  Family History  Problem Relation Age of Onset   Diabetes Mother    Hypertension Mother    Breast cancer Mother 32   Bone cancer Father    Kidney cancer Maternal Grandmother    Stroke Brother    Diabetes Brother    Breast cancer Maternal Aunt 50   Breast cancer Maternal Aunt 70    SOCIAL HISTORY:  Social History   Tobacco Use   Smoking status: Former    Types: Cigarettes   Smokeless tobacco: Never  Vaping Use   Vaping Use: Never used  Substance Use Topics   Alcohol use: Not Currently    Alcohol/week: 6.0 standard drinks    Types: 6 Cans of beer per week   Drug use: Not Currently    Types: Marijuana    Comment: occasional     ALLERGIES: No Known Allergies  MEDICATIONS:  Current Outpatient Medications  Medication Sig Dispense Refill   acetaminophen (TYLENOL) 500 MG tablet Take 1,000 mg by mouth every 6 (six) hours as needed for moderate pain or headache.     atorvastatin (LIPITOR) 20 MG tablet TAKE 1 TABLET BY MOUTH EVERY DAY 90 tablet 0   Etelcalcetide HCl (PARSABIV IV) 1 mg 3 (three) times a week.     iron sucrose in sodium chloride 0.9 % 100 mL Iron Sucrose (Venofer)     midodrine (PROAMATINE) 10 MG tablet Take by mouth.     multivitamin (RENA-VIT) TABS tablet Take 1 tablet by mouth daily.     VELPHORO 500 MG chewable tablet Chew 1,000 mg by mouth See admin instructions. Take 1000 mg with meals and 500 mg with snacks     furosemide (LASIX) 40 MG tablet Take 40 mg by mouth 2 (two) times daily. (Frost not taking: Reported on 02/10/2021)     Sodium Sulfate-Mag Sulfate-KCl (SUTAB) [phone] MG TABS At 5 PM take 12 tablets using Kimberly Frost 8 oz cup provided in Kimberly Frost kit drinking 5  cups of water and 5 hours before your procedure repeat Kimberly Frost same process. (Frost not taking: Reported on 02/10/2021) 24 tablet 0   No current facility-administered medications for this encounter.    REVIEW OF SYSTEMS:  A 10+ POINT REVIEW OF SYSTEMS WAS OBTAINED including neurology, dermatology, psychiatry, cardiac, respiratory, lymph, extremities, GI, GU, musculoskeletal, constitutional, reproductive, HEENT.  she denies any pain within Kimberly Frost chest area significant cough or hemoptysis.  She denies any breathing issues.  Dialysis occurs on Tuesday Thursday and Saturday at 4 AM in Kimberly Frost morning.   PHYSICAL EXAM:  height is 5' 6"  (1.676 m) and weight is 126 lb 6 oz (57.3 kg). Kimberly Frost temporal temperature is 97.2 F (36.2 C) (abnormal). Kimberly Frost blood pressure is 127/74 and Kimberly Frost pulse is 102 (abnormal). Kimberly Frost respiration is 18 and oxygen saturation is 99%.   General: Alert and oriented, in no acute distress HEENT: Head is normocephalic. Extraocular movements are  intact. Oropharynx is clear.  Dentures in place along Kimberly Frost maxillary region.  Partial along Kimberly Frost mandible Neck: Neck is supple, no palpable cervical or supraclavicular lymphadenopathy. Heart: Regular in rate and rhythm with no murmurs, rubs, or gallops. Chest: Clear to auscultation bilaterally, with no rhonchi, wheezes, or rales. Abdomen: Soft, nontender, nondistended, with no rigidity or guarding. Extremities: No cyanosis or edema.  AV access present in Kimberly Frost left mid arm Lymphatics: see Neck Exam Skin: No concerning lesions. Musculoskeletal: symmetric strength and muscle tone throughout. Neurologic: Cranial nerves II through XII are grossly intact. No obvious focalities. Speech is fluent. Coordination is intact. Psychiatric: Judgment and insight are intact. Affect is appropriate.   ECOG = 1  0 - Asymptomatic (Fully active, able to carry on all predisease activities without restriction)  1 - Symptomatic but completely ambulatory (Restricted in physically  strenuous activity but ambulatory and able to carry out work of a light or sedentary nature. For example, light housework, office work)  2 - Symptomatic, <50% in bed during Kimberly Frost day (Ambulatory and capable of all self care but unable to carry out any work activities. Up and about more than 50% of waking hours)  3 - Symptomatic, >50% in bed, but not bedbound (Capable of only limited self-care, confined to bed or chair 50% or more of waking hours)  4 - Bedbound (Completely disabled. Cannot carry on any self-care. Totally confined to bed or chair)  5 - Death   Eustace Pen MM, Creech RH, Tormey DC, et al. [phone]). "Toxicity and response criteria of Kimberly Frost [facility]". [place] Oncol. 5 (6): 649-55  LABORATORY DATA:  Lab Results  Component Value Date   WBC 8.3 07/31/2019   HGB 10.3 (L) 07/31/2019   HCT 30.9 (L) 07/31/2019   MCV 87.7 07/31/2019   PLT 247.0 07/31/2019   NEUTROABS 2.3 02/26/2018   Lab Results  Component Value Date   NA 137 11/01/2019   K 4.3 11/01/2019   CL 91 (L) 11/01/2019   CO2 33 (H) 11/01/2019   GLUCOSE 158 (H) 11/01/2019   CREATININE 8.20 (HH) 11/01/2019   CALCIUM 9.9 11/01/2019      RADIOGRAPHY: No results found.    IMPRESSION: Stage IB (cT2, N0, M0) NSCLC of Kimberly Frost RUL  Due to Kimberly Frost Frost's medical issues she is not considered a surgical candidate.  She would however be a good candidate for hypofractionated accelerated radiation therapy over approximately 10-15 fractions.  Today, I talked to Kimberly Frost Frost and Frost about Kimberly Frost findings and work-up thus far.  We discussed Kimberly Frost natural history of non-small cell lung cancer and general treatment, highlighting Kimberly Frost role of radiotherapy in Kimberly Frost management.  We discussed Kimberly Frost available radiation techniques, and focused on Kimberly Frost details of logistics and delivery.  We reviewed Kimberly Frost anticipated acute and late sequelae associated with radiation in this setting.  Kimberly Frost Frost was encouraged to ask questions that I  answered to Kimberly Frost best of my ability.  A Frost consent form was discussed and signed.  We retained a copy for our records.  Kimberly Frost Frost would like to proceed with radiation and will be scheduled for CT simulation.  PLAN: She will return for simulation in 2 days.  Anticipate treatments beginning approximately a week from Kimberly Frost simulation date.  She will be set up in SBRT like positioning for more accurate set up and to reduce dose to normal surrounding lung structures.   60 minutes of total time was spent for this Frost encounter, including preparation, face-to-face  counseling with Kimberly Frost Frost and coordination of care, physical exam, and documentation of Kimberly Frost encounter.   ------------------------------------------------  Blair Promise, PhD, MD  This document serves as a record of services personally performed by Gery Pray, MD. It was created on his behalf by Roney Mans, a trained medical scribe. Kimberly Frost creation of this record is based on Kimberly Frost scribe's personal observations and Kimberly Frost provider's statements to them. This document has been checked and approved by Kimberly Frost attending provider.

## 2021-02-10 ENCOUNTER — Ambulatory Visit
Admission: RE | Admit: 2021-02-10 | Discharge: 2021-02-10 | Disposition: A | Discharge status: Home / Self Care | Payer: Medicare Other | Source: Ambulatory Visit | Attending: Radiation Oncology | Admitting: Radiation Oncology

## 2021-02-10 ENCOUNTER — Encounter: Payer: Self-pay | Admitting: Radiation Oncology

## 2021-02-10 ENCOUNTER — Other Ambulatory Visit: Payer: Self-pay

## 2021-02-10 VITALS — BP 127/74 | HR 102 | Temp 97.2°F | Resp 18 | Ht 66.0 in | Wt 126.4 lb

## 2021-02-10 DIAGNOSIS — C3491 Malignant neoplasm of unspecified part of right bronchus or lung: Secondary | ICD-10-CM

## 2021-02-10 DIAGNOSIS — C3411 Malignant neoplasm of upper lobe, right bronchus or lung: Secondary | ICD-10-CM | POA: Diagnosis present

## 2021-02-10 DIAGNOSIS — Z923 Personal history of irradiation: Secondary | ICD-10-CM | POA: Diagnosis not present

## 2021-02-10 DIAGNOSIS — C3430 Malignant neoplasm of lower lobe, unspecified bronchus or lung: Secondary | ICD-10-CM

## 2021-02-10 HISTORY — DX: Malignant neoplasm of unspecified part of unspecified bronchus or lung: C34.90

## 2021-02-10 NOTE — Progress Notes (Signed)
See MD note for nursing evaluation. °

## 2021-02-12 ENCOUNTER — Ambulatory Visit: Payer: Medicare Other

## 2021-02-12 ENCOUNTER — Ambulatory Visit
Admission: RE | Admit: 2021-02-12 | Discharge: 2021-02-12 | Disposition: A | Discharge status: Home / Self Care | Payer: Medicare Other | Source: Ambulatory Visit | Attending: Radiation Oncology | Admitting: Radiation Oncology

## 2021-02-12 DIAGNOSIS — C3491 Malignant neoplasm of unspecified part of right bronchus or lung: Secondary | ICD-10-CM

## 2021-02-12 DIAGNOSIS — Z510 Encounter for antineoplastic radiation therapy: Secondary | ICD-10-CM | POA: Insufficient documentation

## 2021-02-12 DIAGNOSIS — C3411 Malignant neoplasm of upper lobe, right bronchus or lung: Secondary | ICD-10-CM | POA: Insufficient documentation

## 2021-02-13 ENCOUNTER — Encounter (HOSPITAL_COMMUNITY): Payer: Self-pay

## 2021-02-14 ENCOUNTER — Encounter (HOSPITAL_COMMUNITY): Payer: Self-pay

## 2021-02-19 ENCOUNTER — Ambulatory Visit: Payer: Medicare Other

## 2021-02-21 DIAGNOSIS — Z510 Encounter for antineoplastic radiation therapy: Secondary | ICD-10-CM | POA: Diagnosis not present

## 2021-02-24 ENCOUNTER — Ambulatory Visit
Admission: RE | Admit: 2021-02-24 | Discharge: 2021-02-24 | Disposition: A | Discharge status: Home / Self Care | Payer: Medicare Other | Source: Ambulatory Visit | Attending: Radiation Oncology | Admitting: Radiation Oncology

## 2021-02-24 ENCOUNTER — Other Ambulatory Visit: Payer: Self-pay

## 2021-02-24 DIAGNOSIS — C3491 Malignant neoplasm of unspecified part of right bronchus or lung: Secondary | ICD-10-CM

## 2021-02-24 DIAGNOSIS — Z510 Encounter for antineoplastic radiation therapy: Secondary | ICD-10-CM | POA: Diagnosis not present

## 2021-02-25 ENCOUNTER — Ambulatory Visit
Admission: RE | Admit: 2021-02-25 | Discharge: 2021-02-25 | Disposition: A | Discharge status: Home / Self Care | Payer: Medicare Other | Source: Ambulatory Visit | Attending: Radiation Oncology | Admitting: Radiation Oncology

## 2021-02-25 DIAGNOSIS — C3491 Malignant neoplasm of unspecified part of right bronchus or lung: Secondary | ICD-10-CM

## 2021-02-25 DIAGNOSIS — Z510 Encounter for antineoplastic radiation therapy: Secondary | ICD-10-CM | POA: Diagnosis not present

## 2021-02-26 ENCOUNTER — Ambulatory Visit: Payer: Medicare Other

## 2021-02-26 ENCOUNTER — Ambulatory Visit: Admission: RE | Admit: 2021-02-26 | Payer: Medicare Other | Source: Ambulatory Visit | Admitting: Radiation Oncology

## 2021-02-26 ENCOUNTER — Other Ambulatory Visit: Payer: Self-pay

## 2021-02-27 ENCOUNTER — Ambulatory Visit
Admission: RE | Admit: 2021-02-27 | Discharge: 2021-02-27 | Disposition: A | Discharge status: Home / Self Care | Payer: Medicare Other | Source: Ambulatory Visit | Attending: Radiation Oncology | Admitting: Radiation Oncology

## 2021-02-27 DIAGNOSIS — C3491 Malignant neoplasm of unspecified part of right bronchus or lung: Secondary | ICD-10-CM

## 2021-02-27 DIAGNOSIS — Z510 Encounter for antineoplastic radiation therapy: Secondary | ICD-10-CM | POA: Diagnosis not present

## 2021-02-28 ENCOUNTER — Other Ambulatory Visit: Payer: Self-pay

## 2021-02-28 ENCOUNTER — Ambulatory Visit
Admission: RE | Admit: 2021-02-28 | Discharge: 2021-02-28 | Disposition: A | Discharge status: Home / Self Care | Payer: Medicare Other | Source: Ambulatory Visit | Attending: Radiation Oncology | Admitting: Radiation Oncology

## 2021-02-28 DIAGNOSIS — Z510 Encounter for antineoplastic radiation therapy: Secondary | ICD-10-CM | POA: Diagnosis not present

## 2021-03-03 ENCOUNTER — Ambulatory Visit
Admission: RE | Admit: 2021-03-03 | Discharge: 2021-03-03 | Disposition: A | Discharge status: Home / Self Care | Payer: Medicare Other | Source: Ambulatory Visit | Attending: Radiation Oncology | Admitting: Radiation Oncology

## 2021-03-03 ENCOUNTER — Other Ambulatory Visit: Payer: Self-pay

## 2021-03-03 DIAGNOSIS — Z510 Encounter for antineoplastic radiation therapy: Secondary | ICD-10-CM | POA: Diagnosis not present

## 2021-03-03 DIAGNOSIS — C3491 Malignant neoplasm of unspecified part of right bronchus or lung: Secondary | ICD-10-CM

## 2021-03-04 ENCOUNTER — Ambulatory Visit
Admission: RE | Admit: 2021-03-04 | Discharge: 2021-03-04 | Disposition: A | Discharge status: Home / Self Care | Payer: Medicare Other | Source: Ambulatory Visit | Attending: Radiation Oncology | Admitting: Radiation Oncology

## 2021-03-04 DIAGNOSIS — Z510 Encounter for antineoplastic radiation therapy: Secondary | ICD-10-CM | POA: Diagnosis not present

## 2021-03-04 DIAGNOSIS — C3491 Malignant neoplasm of unspecified part of right bronchus or lung: Secondary | ICD-10-CM

## 2021-03-05 ENCOUNTER — Ambulatory Visit: Payer: Medicare Other

## 2021-03-05 ENCOUNTER — Ambulatory Visit
Admission: RE | Admit: 2021-03-05 | Discharge: 2021-03-05 | Disposition: A | Discharge status: Home / Self Care | Payer: Medicare Other | Source: Ambulatory Visit | Attending: Radiation Oncology | Admitting: Radiation Oncology

## 2021-03-05 DIAGNOSIS — C3491 Malignant neoplasm of unspecified part of right bronchus or lung: Secondary | ICD-10-CM | POA: Diagnosis not present

## 2021-03-06 ENCOUNTER — Other Ambulatory Visit: Payer: Self-pay

## 2021-03-06 ENCOUNTER — Ambulatory Visit
Admission: RE | Admit: 2021-03-06 | Discharge: 2021-03-06 | Disposition: A | Discharge status: Home / Self Care | Payer: Medicare Other | Source: Ambulatory Visit | Attending: Radiation Oncology | Admitting: Radiation Oncology

## 2021-03-06 DIAGNOSIS — C3491 Malignant neoplasm of unspecified part of right bronchus or lung: Secondary | ICD-10-CM

## 2021-03-07 ENCOUNTER — Other Ambulatory Visit: Payer: Self-pay

## 2021-03-07 ENCOUNTER — Ambulatory Visit
Admission: RE | Admit: 2021-03-07 | Discharge: 2021-03-07 | Disposition: A | Discharge status: Home / Self Care | Payer: Medicare Other | Source: Ambulatory Visit | Attending: Radiation Oncology | Admitting: Radiation Oncology

## 2021-03-07 DIAGNOSIS — C3491 Malignant neoplasm of unspecified part of right bronchus or lung: Secondary | ICD-10-CM | POA: Diagnosis not present

## 2021-03-10 ENCOUNTER — Ambulatory Visit
Admission: RE | Admit: 2021-03-10 | Discharge: 2021-03-10 | Disposition: A | Discharge status: Home / Self Care | Payer: Medicare Other | Source: Ambulatory Visit | Attending: Radiation Oncology | Admitting: Radiation Oncology

## 2021-03-10 ENCOUNTER — Encounter: Payer: Self-pay | Admitting: Radiation Oncology

## 2021-03-10 DIAGNOSIS — C3491 Malignant neoplasm of unspecified part of right bronchus or lung: Secondary | ICD-10-CM | POA: Diagnosis not present

## 2021-03-12 ENCOUNTER — Ambulatory Visit: Payer: Medicare Other

## 2021-03-19 ENCOUNTER — Ambulatory Visit: Payer: Medicare Other

## 2021-03-19 IMAGING — MG DIGITAL SCREENING BILAT W/ TOMO W/ CAD
8 series · 8 of 24 positions shown · non-contrast
Comparison: None.

CLINICAL DATA: Screening.

EXAM:
DIGITAL SCREENING BILATERAL MAMMOGRAM WITH TOMO AND CAD

[R CC synth-2D]
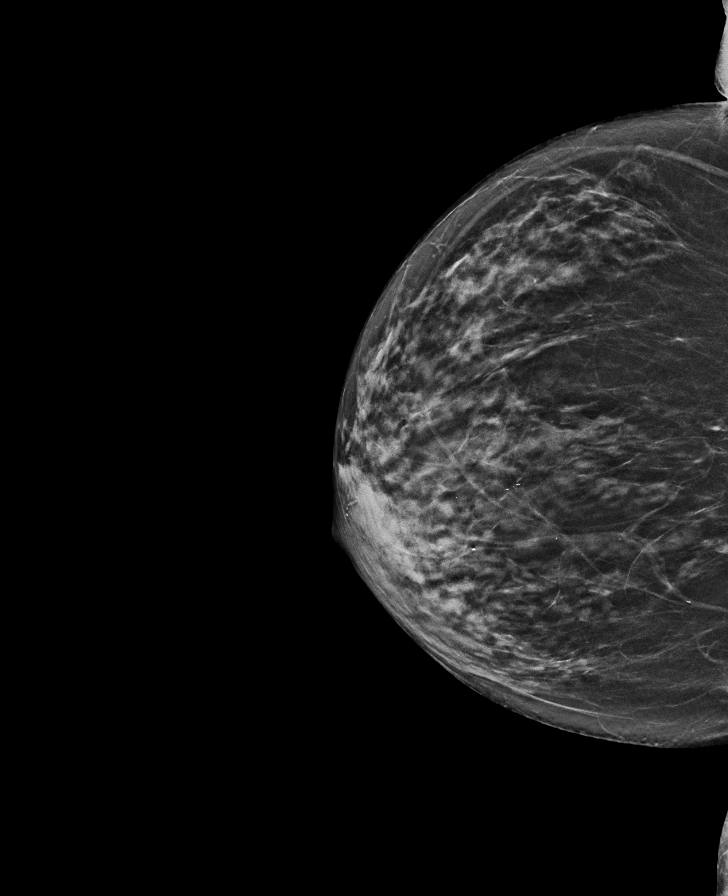

[R MLO synth-2D]
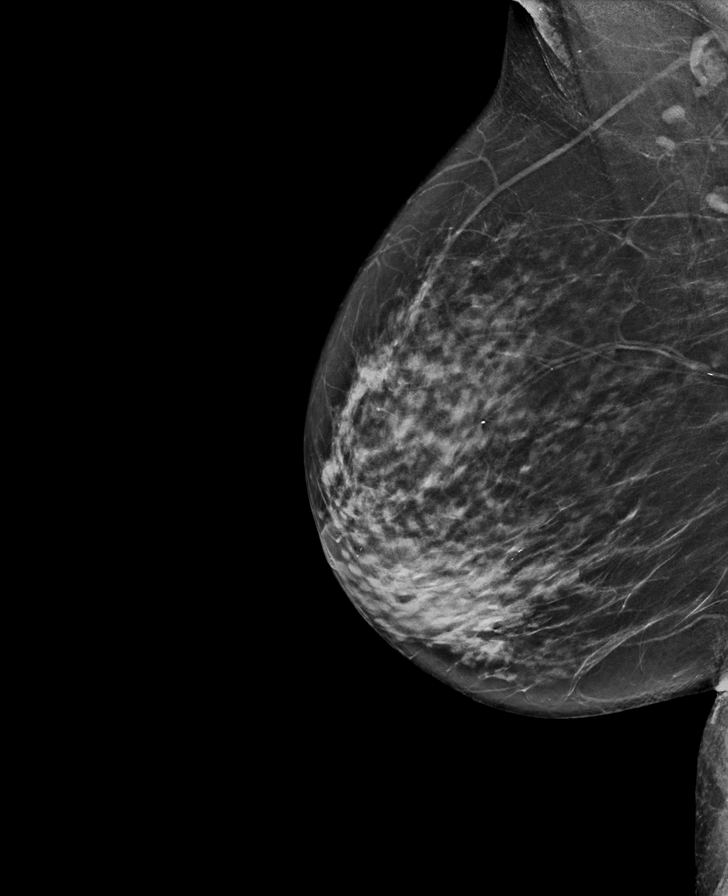

[L CC synth-2D]
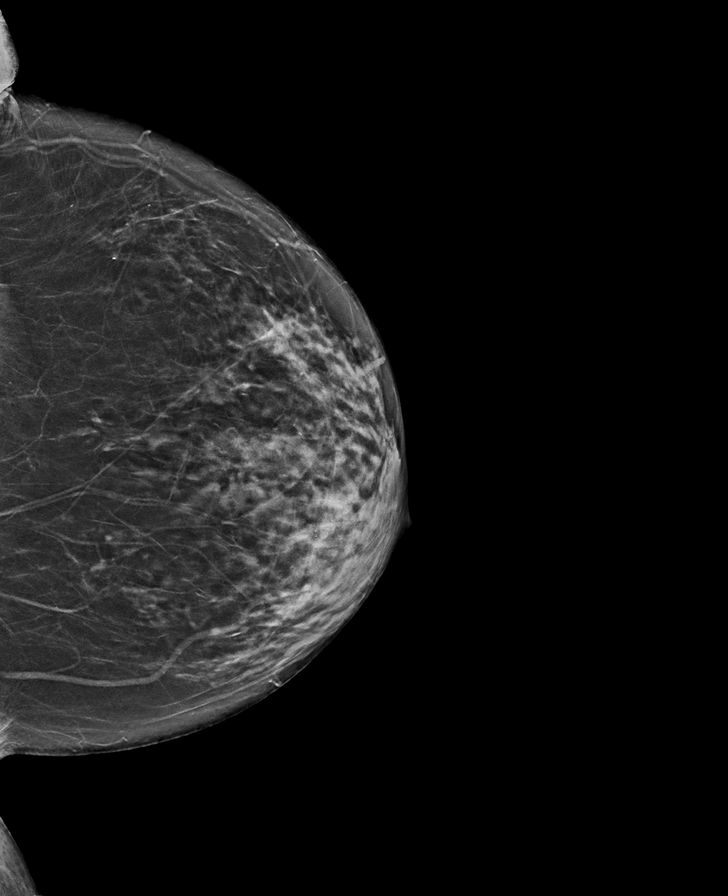

[L MLO synth-2D]
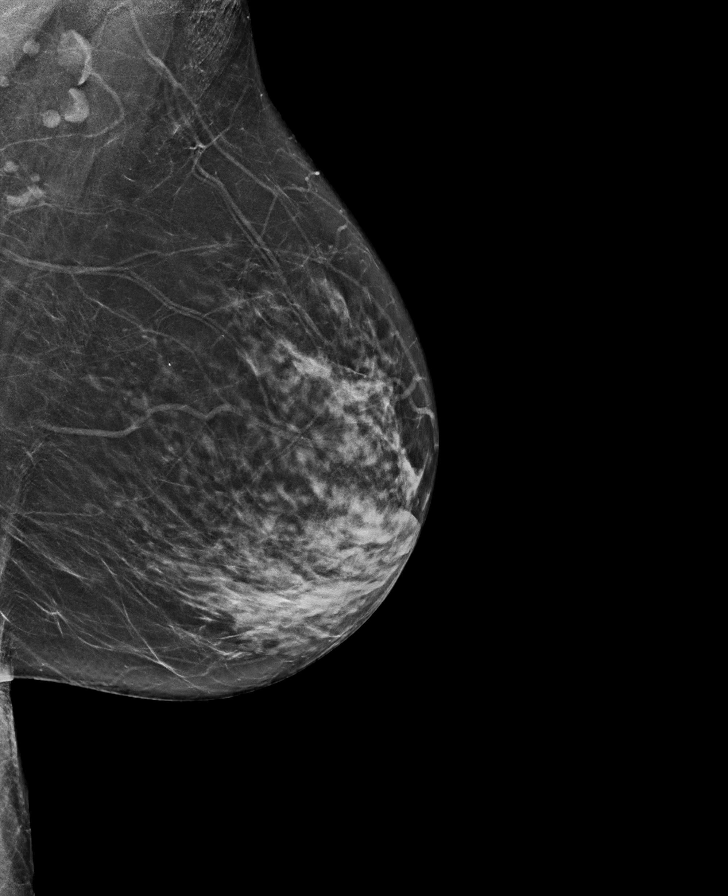

[L CC tomo · tomo slice 31/62.0]
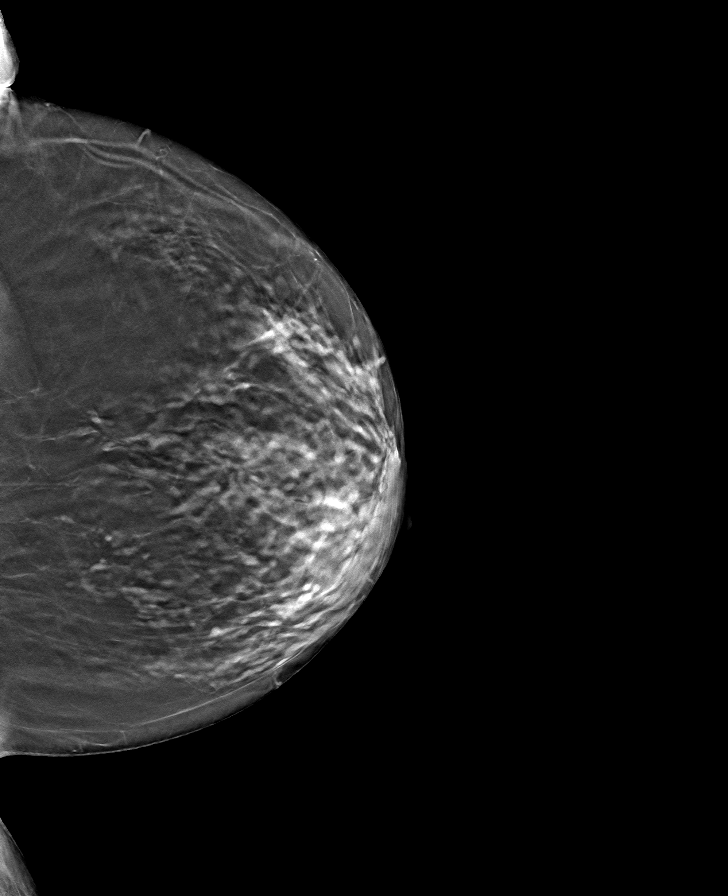

[R CC tomo · tomo slice 29/57.0]
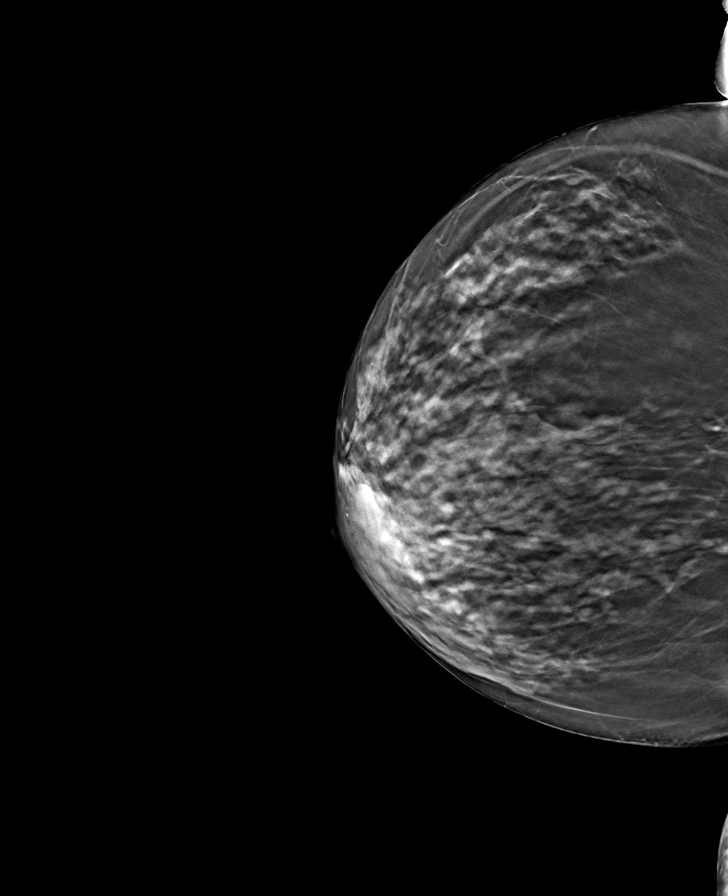

[R MLO tomo · tomo slice 31/62.0]
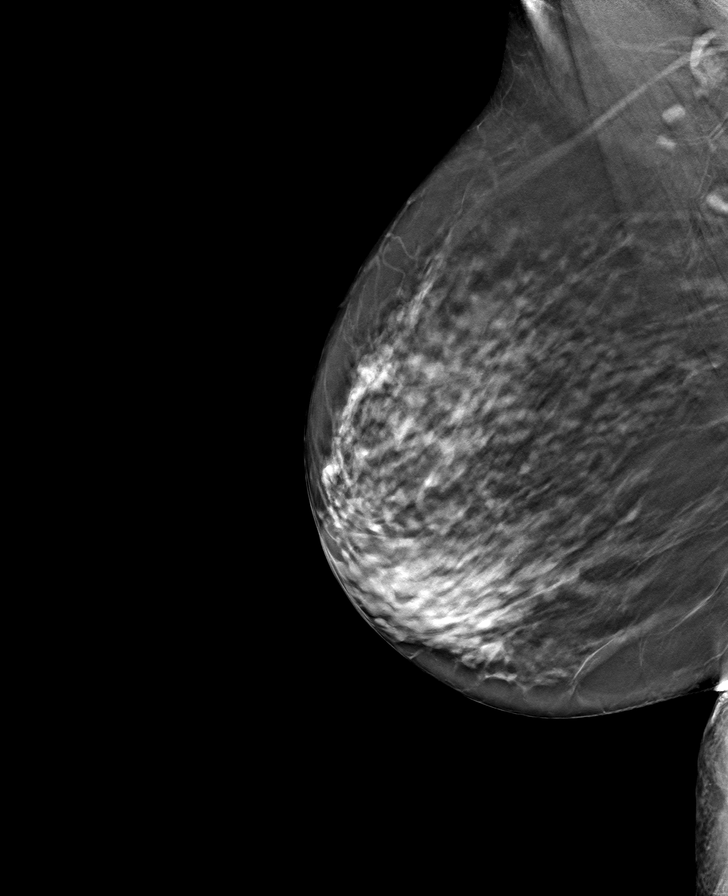

[L MLO tomo · tomo slice 33/64.0]
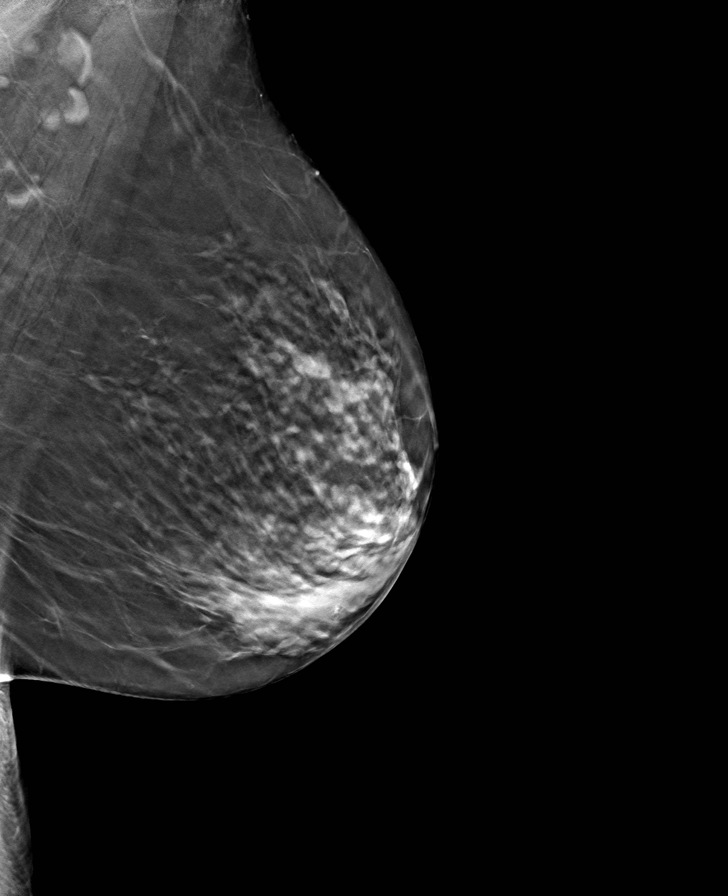

[8 of 24 positions shown; findings below may reference images not displayed]

ACR Breast Density Category c: The breast tissue is heterogeneously
dense, which may obscure small masses.
FINDINGS: In the left breast, a possible asymmetry warrants further
evaluation. In the right breast, no findings suspicious for
malignancy. Images were processed with CAD.
IMPRESSION: Further evaluation is suggested for possible asymmetry in the left
breast.

RECOMMENDATION:
Diagnostic mammogram and possibly ultrasound of the left breast.
(Code:[id number])

The patient will be contacted regarding the findings, and additional
imaging will be scheduled.

BI-RADS CATEGORY  0: Incomplete. Need additional imaging evaluation
and/or prior mammograms for comparison.

## 2021-03-26 ENCOUNTER — Ambulatory Visit: Payer: Medicare Other

## 2021-03-31 ENCOUNTER — Encounter: Payer: Self-pay | Admitting: Radiology

## 2021-04-02 ENCOUNTER — Ambulatory Visit: Payer: Medicare Other

## 2021-04-05 NOTE — Progress Notes (Signed)
?Radiation Oncology         (336) [phone] ?________________________________ ? ?Name: Kimberly Frost MRN: [id number]  ?Date: 04/07/2021  DOB: March 19, 1950 ? ?Follow-Up Visit Note ? ?CC: Care, [place], Joshua C, MD ? ?  ICD-10-CM   ?1. NSCLC of right lung ([place])  C34.91 CT CHEST WO CONTRAST  ?  ? ? ?Diagnosis: The primary encounter diagnosis was NSCLC of right lung ([place]). A diagnosis of NSCLC of lower lobe ([place]) was also pertinent to this visit. ?  ?Stage IB (cT2, N0, M0) NSCLC of the RUL ? ?Interval Since Last Radiation: 1 month ? ?Intent: Curative ? ?Radiation Treatment Dates: 02/24/2021 through 03/10/2021 ?Site Technique Total Dose (Gy) Dose per Fx (Gy) Completed Fx Beam Energies  ?Lung, Right: Lung_R IMRT 50/50 5 10/10 6XFFF  ? ? ?Narrative:  The patient returns today for routine follow-up. The patient tolerated radiation therapy relatively well. On the date of her final weekly treatment check on 03/04/21, the patient reported fatigue (attributed to her dialysis), and worsening cough at night. She denied any other symptoms.  ? ?No significant interval history since the patient was seen for consultation on 02/10/21.  She denies any significant coughing at this time.  She denies any chills or fever.  She reports fatigue related to her dialysis.  She denies any pain in the chest region. ? ? ?                             ?Allergies:  has No Known Allergies. ? ?Meds: ?Current Outpatient Medications  ?Medication Sig Dispense Refill  ? acetaminophen (TYLENOL) 500 MG tablet Take 1,000 mg by mouth every 6 (six) hours as needed for moderate pain or headache.    ? atorvastatin (LIPITOR) 20 MG tablet TAKE 1 TABLET BY MOUTH EVERY DAY 90 tablet 0  ? Etelcalcetide HCl (PARSABIV IV) 1 mg 3 (three) times a week.    ? iron sucrose in sodium chloride 0.9 % 100 mL Iron Sucrose (Venofer)    ? midodrine (PROAMATINE) 10 MG tablet Take by mouth.    ? multivitamin (RENA-VIT) TABS tablet Take 1 tablet by mouth daily.    ?  VELPHORO 500 MG chewable tablet Chew 1,000 mg by mouth See admin instructions. Take 1000 mg with meals and 500 mg with snacks    ? furosemide (LASIX) 40 MG tablet Take 40 mg by mouth 2 (two) times daily. (Patient not taking: Reported on 02/10/2021)    ? Sodium Sulfate-Mag Sulfate-KCl (SUTAB) [phone] MG TABS At 5 PM take 12 tablets using the 8 oz cup provided in the kit drinking 5 cups of water and 5 hours before your procedure repeat the same process. (Patient not taking: Reported on 02/10/2021) 24 tablet 0  ? ?No current facility-administered medications for this encounter.  ? ? ?Physical Findings: ?The patient is in no acute distress. Patient is alert and oriented. ? height is _0  (1.676 m) and weight is 123 lb (55.8 kg). Her temperature is 97.8 ?F (36.6 ?C). Her blood pressure is 156/78 (abnormal) and her pulse is 110 (abnormal). Her respiration is 20 and oxygen saturation is 100%. .  No significant changes. Lungs are clear to auscultation bilaterally. Heart has regular rate and rhythm. No palpable cervical, supraclavicular, or axillary adenopathy. Abdomen soft, non-tender, normal bowel sounds.  ? ? ?Lab Findings: ?Lab Results  ?Component Value Date  ? WBC 8.3 07/31/2019  ? HGB 10.3 (L) 07/31/2019  ?  HCT 30.9 (L) 07/31/2019  ? MCV 87.7 07/31/2019  ? PLT 247.0 07/31/2019  ? ? ?Radiographic Findings: ?No results found. ? ?Impression: The primary encounter diagnosis was NSCLC of right lung ([place]). A diagnosis of NSCLC of lower lobe ([place]) was also pertinent to this visit. ?  ?Stage IB (cT2, N0, M0) NSCLC of the RUL ? ?The patient tolerated her radiation therapy well and has recovered from this issue.  No unusual coughing on evaluation today. ? ?Plan: Routine follow-up in 3 months.  Prior to this follow-up appointment the patient will undergo a repeat CT scan of the chest to assess response to her SBRT directed at the right upper lung mass. ? ? ? ?____________________________________ ? ?Blair Promise, PhD,  MD ? ? ?This document serves as a record of services personally performed by Gery Pray, MD. It was created on his behalf by Roney Mans, a trained medical scribe. The creation of this record is based on the scribe's personal observations and the provider's statements to them. This document has been checked and approved by the attending provider. ? ?

## 2021-04-05 NOTE — Progress Notes (Incomplete)
°  Radiation Oncology         (336) [phone] ________________________________  Patient Name: Kimberly Frost MRN: [id number] DOB: 1950-07-06 Referring Physician: [name] Date of Service: 03/10/2021 [facility], [place]                                                        End Of Treatment Note  Diagnoses: C34.11-Malignant neoplasm of upper lobe, right bronchus or lung C34.30-Malignant neoplasm of lower lobe, unspecified bronchus or lung  Cancer Staging:  The primary encounter diagnosis was NSCLC of right lung ([place]). A diagnosis of NSCLC of lower lobe ([place]) was also pertinent to this visit.   Stage IB (cT2, N0, M0) NSCLC of the RUL  Intent: Curative  Radiation Treatment Dates: 02/24/2021 through 03/10/2021 Site Technique Total Dose (Gy) Dose per Fx (Gy) Completed Fx Beam Energies  Lung, Right: Lung_R IMRT 50/50 5 10/10 6XFFF   Narrative: The patient tolerated radiation therapy relatively well. On the date of her final weekly treatment check on 03/04/21, the patient reported fatigue (attributed to her dialysis), and worsening cough at night. She denied any other symptoms.   Plan: The patient will follow-up with radiation oncology in one month .  ________________________________________________ -----------------------------------  Blair Promise, PhD, MD  This document serves as a record of services personally performed by Gery Pray, MD. It was created on his behalf by Roney Mans, a trained medical scribe. The creation of this record is based on the scribe's personal observations and the provider's statements to them. This document has been checked and approved by the attending provider.

## 2021-04-07 ENCOUNTER — Ambulatory Visit
Admission: RE | Admit: 2021-04-07 | Discharge: 2021-04-07 | Disposition: A | Discharge status: Home / Self Care | Payer: Medicare Other | Source: Ambulatory Visit | Attending: Radiation Oncology | Admitting: Radiation Oncology

## 2021-04-07 ENCOUNTER — Other Ambulatory Visit: Payer: Self-pay

## 2021-04-07 ENCOUNTER — Encounter: Payer: Self-pay | Admitting: Radiation Oncology

## 2021-04-07 DIAGNOSIS — C3491 Malignant neoplasm of unspecified part of right bronchus or lung: Secondary | ICD-10-CM

## 2021-04-07 DIAGNOSIS — Z79899 Other long term (current) drug therapy: Secondary | ICD-10-CM | POA: Diagnosis not present

## 2021-04-07 DIAGNOSIS — Z923 Personal history of irradiation: Secondary | ICD-10-CM | POA: Diagnosis not present

## 2021-04-07 DIAGNOSIS — R5383 Other fatigue: Secondary | ICD-10-CM | POA: Insufficient documentation

## 2021-04-07 DIAGNOSIS — C3411 Malignant neoplasm of upper lobe, right bronchus or lung: Secondary | ICD-10-CM | POA: Insufficient documentation

## 2021-04-07 DIAGNOSIS — Z992 Dependence on renal dialysis: Secondary | ICD-10-CM | POA: Diagnosis not present

## 2021-04-07 NOTE — Progress Notes (Addendum)
Kimberly Frost is here today for follow up post radiation to the lung. ? ?Lung Side: Right, patient completed treatment on 03/10/21. ? ?Does the patient complain of any of the following: ?Pain: No ?Shortness of breath w/wo exertion: yes, after dialysis  ?Cough: Yes, dry cough ?Hemoptysis: no ?Pain with swallowing: no ?Swallowing/choking concerns: no ?Appetite: Good, drinking ensure daily. ?Energy Level: low  ?Post radiation skin Changes: no ? ? ? ?Additional comments if applicable:  ? ?Vitals:  ? 04/07/21 1050  ?BP: (!) 156/78  ?Pulse: (!) 110  ?Resp: 20  ?Temp: 97.8 ?F (36.6 ?C)  ?SpO2: 100%  ?Weight: 123 lb (55.8 kg)  ?Height: 5\' 6"  (1.676 m)  ?  ?

## 2021-04-09 ENCOUNTER — Ambulatory Visit: Payer: Medicare Other

## 2021-04-16 ENCOUNTER — Ambulatory Visit: Payer: Medicare Other

## 2021-04-23 ENCOUNTER — Ambulatory Visit: Payer: Medicare Other

## 2021-04-30 ENCOUNTER — Ambulatory Visit: Payer: Medicare Other

## 2021-05-07 ENCOUNTER — Ambulatory Visit: Payer: Medicare Other

## 2021-05-14 ENCOUNTER — Ambulatory Visit: Payer: Medicare Other

## 2021-05-21 ENCOUNTER — Ambulatory Visit: Payer: Medicare Other

## 2021-05-28 ENCOUNTER — Ambulatory Visit: Payer: Medicare Other

## 2021-06-04 ENCOUNTER — Ambulatory Visit: Payer: Medicare Other

## 2021-06-09 ENCOUNTER — Encounter (HOSPITAL_COMMUNITY): Payer: Self-pay

## 2021-06-11 ENCOUNTER — Ambulatory Visit: Payer: Medicare Other

## 2021-06-16 ENCOUNTER — Telehealth: Payer: Self-pay | Admitting: *Deleted

## 2021-06-16 ENCOUNTER — Encounter (INDEPENDENT_AMBULATORY_CARE_PROVIDER_SITE_OTHER): Payer: Self-pay | Admitting: Vascular Surgery

## 2021-06-16 ENCOUNTER — Ambulatory Visit (INDEPENDENT_AMBULATORY_CARE_PROVIDER_SITE_OTHER): Payer: Medicare Other

## 2021-06-16 ENCOUNTER — Ambulatory Visit (INDEPENDENT_AMBULATORY_CARE_PROVIDER_SITE_OTHER): Payer: Medicare Other | Admitting: Vascular Surgery

## 2021-06-16 VITALS — BP 110/67 | HR 98 | Resp 16 | Wt 135.8 lb

## 2021-06-16 DIAGNOSIS — N186 End stage renal disease: Secondary | ICD-10-CM

## 2021-06-16 DIAGNOSIS — I10 Essential (primary) hypertension: Secondary | ICD-10-CM | POA: Diagnosis not present

## 2021-06-16 DIAGNOSIS — E782 Mixed hyperlipidemia: Secondary | ICD-10-CM | POA: Diagnosis not present

## 2021-06-16 DIAGNOSIS — Z992 Dependence on renal dialysis: Secondary | ICD-10-CM

## 2021-06-16 NOTE — Telephone Encounter (Signed)
CALLED PATIENT TO INFORM THAT SCAN HAS BEEN MOVED TO 07-09-21 - ARRIVAL TIME- 11 AM @ [facility], NO RESTRICTIONS TO TEST, PATIENT TO RECEIVE RESULTS FROM DR. KINARD ON 07-14-21 @ 11:45 AM, SPOKE WITH PATIENT AND SHE IS AWARE OF THESE APPTS. ?

## 2021-06-16 NOTE — Progress Notes (Signed)
? ? ?MRN : [id number] ? ?Kimberly Frost is a 71 y.o. (1950/05/30) female who presents with chief complaint of check access. ? ?History of Present Illness:  ? ?The patient returns to the office for follow up regarding a problem with their dialysis access.  ? ?The patient has also been informed that there is increased recirculation.    The patient denies hand pain or other symptoms consistent with steal phenomena.  No significant arm swelling. ? ?The patient denies redness or swelling at the access site. The patient denies fever or chills at home or while on dialysis. ? ?No recent shortening of the patient's walking distance or new symptoms consistent with claudication.  No history of rest pain symptoms. No new ulcers or wounds of the lower extremities have occurred. ? ?The patient denies amaurosis fugax or recent TIA symptoms. There are no recent neurological changes noted. ?There is no history of DVT, PE or superficial thrombophlebitis. ?No recent episodes of angina or shortness of breath documented.  ? ?Duplex ultrasound of the AV access shows a patent access.  The previously noted stenosis is significantly increased compared to last study.  Flow volume today is 905 cc/min (previous flow volume was 1963 cc/min)  ? ?Current Meds  ?Medication Sig  ? acetaminophen (TYLENOL) 500 MG tablet Take 1,000 mg by mouth every 6 (six) hours as needed for moderate pain or headache.  ? atorvastatin (LIPITOR) 20 MG tablet TAKE 1 TABLET BY MOUTH EVERY DAY  ? gabapentin (NEURONTIN) 100 MG capsule Take 100 mg by mouth at bedtime.  ? iron sucrose in sodium chloride 0.9 % 100 mL Iron Sucrose (Venofer)  ? midodrine (PROAMATINE) 10 MG tablet Take by mouth.  ? multivitamin (RENA-VIT) TABS tablet Take 1 tablet by mouth daily.  ? VELPHORO 500 MG chewable tablet Chew 1,000 mg by mouth See admin instructions. Take 1000 mg with meals and 500 mg with snacks  ? ? ?Past Medical History:  ?Diagnosis Date  ? Chronic kidney disease   ? Edema   ? RLE   ? Hepatitis   ? Hep C  ? History of blood transfusion   ? History of radiation therapy   ? right lung 02/24/2021-03/10/2021  Dr Gery Pray  ? Hypertension   ? Lung cancer ([place])   ? Wears dentures   ? ? ?Past Surgical History:  ?Procedure Laterality Date  ? A/V FISTULAGRAM Left 04/11/2019  ? Procedure: A/V FISTULAGRAM;  Surgeon: Katha Cabal, MD;  Location: [place] [facility];  Service: Cardiovascular;  Laterality: Left;  ? A/V FISTULAGRAM Left 05/01/2019  ? Procedure: A/V FISTULAGRAM;  Surgeon: Algernon Huxley, MD;  Location: [place] [facility];  Service: Cardiovascular;  Laterality: Left;  ? A/V FISTULAGRAM Left 01/15/2020  ? Procedure: A/V FISTULAGRAM;  Surgeon: Algernon Huxley, MD;  Location: [place] [facility];  Service: Cardiovascular;  Laterality: Left;  ? A/V FISTULAGRAM Left 04/22/2020  ? Procedure: A/V FISTULAGRAM;  Surgeon: Algernon Huxley, MD;  Location: [place] [facility];  Service: Cardiovascular;  Laterality: Left;  ? AV FISTULA PLACEMENT Left 06/16/2018  ? Procedure: Left Arm ARTERIOVENOUS (AV) FISTULA CREATION;  Surgeon: Marty Heck, MD;  Location: [place];  Service: Vascular;  Laterality: Left;  ? MULTIPLE TOOTH EXTRACTIONS    ? MYOMECTOMY    ? ? ?Social History ?Social History  ? ?Tobacco Use  ? Smoking status: Former  ?  Types: Cigarettes  ? Smokeless tobacco: Never  ?Vaping Use  ? Vaping Use:  Never used  ?Substance Use Topics  ? Alcohol use: Not Currently  ?  Alcohol/week: 6.0 standard drinks  ?  Types: 6 Cans of beer per week  ? Drug use: Not Currently  ?  Types: Marijuana  ?  Comment: occasional   ? ? ?Family History ?Family History  ?Problem Relation Age of Onset  ? Diabetes Mother   ? Hypertension Mother   ? Breast cancer Mother 56  ? Bone cancer Father   ? Kidney cancer Maternal Grandmother   ? Stroke Brother   ? Diabetes Brother   ? Breast cancer Maternal Aunt 27  ? Breast cancer Maternal Aunt 37  ? ? ?No Known Allergies ? ? ?REVIEW OF SYSTEMS (Negative unless  checked) ? ?Constitutional: [] Weight loss  [] Fever  [] Chills ?Cardiac: [] Chest pain   [] Chest pressure   [] Palpitations   [] Shortness of breath when laying flat   [] Shortness of breath with exertion. ?Vascular:  [] Pain in legs with walking   [] Pain in legs at rest  [] History of DVT   [] Phlebitis   [] Swelling in legs   [] Varicose veins   [] Non-healing ulcers ?Pulmonary:   [] Uses home oxygen   [] Productive cough   [] Hemoptysis   [] Wheeze  [] COPD   [] Asthma ?Neurologic:  [] Dizziness   [] Seizures   [] History of stroke   [] History of TIA  [] Aphasia   [] Vissual changes   [] Weakness or numbness in arm   [] Weakness or numbness in leg ?Musculoskeletal:   [] Joint swelling   [] Joint pain   [] Low back pain ?Hematologic:  [] Easy bruising  [] Easy bleeding   [] Hypercoagulable state   [] Anemic ?Gastrointestinal:  [] Diarrhea   [] Vomiting  [] Gastroesophageal reflux/heartburn   [] Difficulty swallowing. ?Genitourinary:  [x] Chronic kidney disease   [] Difficult urination  [] Frequent urination   [] Blood in urine ?Skin:  [] Rashes   [] Ulcers  ?Psychological:  [] History of anxiety   []  History of major depression. ? ?Physical Examination ? ?Vitals:  ? 06/16/21 0840  ?BP: 110/67  ?Pulse: 98  ?Resp: 16  ?Weight: 135 lb 12.8 oz (61.6 kg)  ? ?Body mass index is 21.92 kg/m?. ?Gen: WD/WN, NAD ?Head: [place]/AT, No temporalis wasting.  ?Ear/Nose/Throat: Hearing grossly intact, nares w/o erythema or drainage ?Eyes: PER, EOMI, sclera nonicteric.  ?Neck: Supple, no gross masses or lesions.  No JVD.  ?Pulmonary:  Good air movement, no audible wheezing, no use of accessory muscles.  ?Cardiac: RRR, precordium non-hyperdynamic. ?Vascular:   left arm AV access is pulsatile ?Vessel Right Left  ?Radial Palpable Palpable  ?Brachial Palpable Palpable  ?Gastrointestinal: soft, non-distended. No guarding/no peritoneal signs.  ?Musculoskeletal: M/S 5/5 throughout.  No deformity.  ?Neurologic: CN 2-12 intact. Pain and light touch intact in extremities.  Symmetrical.   Speech is fluent. Motor exam as listed above. ?Psychiatric: Judgment intact, Mood & affect appropriate for pt's clinical situation. ?Dermatologic: No rashes or ulcers noted.  No changes consistent with cellulitis. ? ? ?CBC ?Lab Results  ?Component Value Date  ? WBC 8.3 07/31/2019  ? HGB 10.3 (L) 07/31/2019  ? HCT 30.9 (L) 07/31/2019  ? MCV 87.7 07/31/2019  ? PLT 247.0 07/31/2019  ? ? ?BMET ?   ?Component Value Date/Time  ? NA 137 11/01/2019 0856  ? NA 138 06/23/2018 0000  ? K 4.3 11/01/2019 0856  ? K 4.9 06/23/2018 0000  ? CL 91 (L) 11/01/2019 0856  ? CO2 33 (H) 11/01/2019 0856  ? GLUCOSE 158 (H) 11/01/2019 0856  ? BUN 27 (H) 11/01/2019 0856  ? BUN 61  06/23/2018 0000  ? CREATININE 8.20 ([place]) 11/01/2019 0856  ? CREATININE 7.13 06/23/2018 0000  ? CALCIUM 9.9 11/01/2019 0856  ? CALCIUM 8.8 06/23/2018 0000  ? GFRNONAA 9 (L) 02/27/2018 0533  ? GFRAA 6 06/23/2018 0000  ? ?CrCl cannot be calculated (Patient's most recent lab result is older than the maximum 21 days allowed.). ? ?COAG ?No results found for: INR, PROTIME ? ?Radiology ?No results found. ? ? ?Assessment/Plan ?1. ESRD (end stage renal disease) on dialysis [facility]) ?Recommend: ? ?The patient is experiencing increasing problems with their dialysis access. ? ?Patient should have a fistulagram with the intention for intervention.  The intention for intervention is to restore appropriate flow and prevent thrombosis and possible loss of the access.  As well as improve the quality of dialysis therapy. ? ?The risks, benefits and alternative therapies were reviewed in detail with the patient.  All questions were answered.  The patient agrees to proceed with angio/intervention.   ? ?The patient will follow up with me in the office after the procedure. ?  ? ?2. Essential hypertension, benign ?Continue antihypertensive medications as already ordered, these medications have been reviewed and there are no changes at this time.  ? ?3. Mixed hyperlipidemia ?Continue statin as  ordered and reviewed, no changes at this time  ? ? ? ? ?Hortencia Pilar, MD ? ?06/16/2021 ?8:48 AM ? ?  ?

## 2021-06-16 NOTE — Telephone Encounter (Signed)
Called patient to inform of Ct for 07-08-21- arrival time- 11 am @ [facility], no restrictions to test, patient to receive results from Dr. Sondra Come on 07-10-21 @ 11 am, spoke with patient and she is aware of these appts. ?

## 2021-06-16 NOTE — H&P (View-Only) (Signed)
MRN : [id number]  Kimberly Frost is a 71 y.o. (06-06-1950) female who presents with chief complaint of check access.  History of Present Illness:   The patient returns to the office for follow up regarding a problem with their dialysis access.   The patient has also been informed that there is increased recirculation.    The patient denies hand pain or other symptoms consistent with steal phenomena.  No significant arm swelling.  The patient denies redness or swelling at the access site. The patient denies fever or chills at home or while on dialysis.  No recent shortening of the patient's walking distance or new symptoms consistent with claudication.  No history of rest pain symptoms. No new ulcers or wounds of the lower extremities have occurred.  The patient denies amaurosis fugax or recent TIA symptoms. There are no recent neurological changes noted. There is no history of DVT, PE or superficial thrombophlebitis. No recent episodes of angina or shortness of breath documented.   Duplex ultrasound of the AV access shows a patent access.  The previously noted stenosis is significantly increased compared to last study.  Flow volume today is 905 cc/min (previous flow volume was 1963 cc/min)   Current Meds  Medication Sig   acetaminophen (TYLENOL) 500 MG tablet Take 1,000 mg by mouth every 6 (six) hours as needed for moderate pain or headache.   atorvastatin (LIPITOR) 20 MG tablet TAKE 1 TABLET BY MOUTH EVERY DAY   gabapentin (NEURONTIN) 100 MG capsule Take 100 mg by mouth at bedtime.   iron sucrose in sodium chloride 0.9 % 100 mL Iron Sucrose (Venofer)   midodrine (PROAMATINE) 10 MG tablet Take by mouth.   multivitamin (RENA-VIT) TABS tablet Take 1 tablet by mouth daily.   VELPHORO 500 MG chewable tablet Chew 1,000 mg by mouth See admin instructions. Take 1000 mg with meals and 500 mg with snacks    Past Medical History:  Diagnosis Date   Chronic kidney disease    Edema    RLE    Hepatitis    Hep C   History of blood transfusion    History of radiation therapy    right lung 02/24/2021-03/10/2021  Dr Gery Pray   Hypertension    Lung cancer [facility])    Wears dentures     Past Surgical History:  Procedure Laterality Date   A/V FISTULAGRAM Left 04/11/2019   Procedure: A/V FISTULAGRAM;  Surgeon: Katha Cabal, MD;  Location: [place] [facility];  Service: Cardiovascular;  Laterality: Left;   A/V FISTULAGRAM Left 05/01/2019   Procedure: A/V FISTULAGRAM;  Surgeon: Algernon Huxley, MD;  Location: [place] [facility];  Service: Cardiovascular;  Laterality: Left;   A/V FISTULAGRAM Left 01/15/2020   Procedure: A/V FISTULAGRAM;  Surgeon: Algernon Huxley, MD;  Location: [place] [facility];  Service: Cardiovascular;  Laterality: Left;   A/V FISTULAGRAM Left 04/22/2020   Procedure: A/V FISTULAGRAM;  Surgeon: Algernon Huxley, MD;  Location: [place] [facility];  Service: Cardiovascular;  Laterality: Left;   AV FISTULA PLACEMENT Left 06/16/2018   Procedure: Left Arm ARTERIOVENOUS (AV) FISTULA CREATION;  Surgeon: Marty Heck, MD;  Location: [place];  Service: Vascular;  Laterality: Left;   MULTIPLE TOOTH EXTRACTIONS     MYOMECTOMY      Social History Social History   Tobacco Use   Smoking status: Former    Types: Cigarettes   Smokeless tobacco: Never  Vaping Use   Vaping Use:  Never used  Substance Use Topics   Alcohol use: Not Currently    Alcohol/week: 6.0 standard drinks    Types: 6 Cans of beer per week   Drug use: Not Currently    Types: Marijuana    Comment: occasional     Family History Family History  Problem Relation Age of Onset   Diabetes Mother    Hypertension Mother    Breast cancer Mother 78   Bone cancer Father    Kidney cancer Maternal Grandmother    Stroke Brother    Diabetes Brother    Breast cancer Maternal Aunt 98   Breast cancer Maternal Aunt 10    No Known Allergies   REVIEW OF SYSTEMS (Negative unless  checked)  Constitutional: [] Weight loss  [] Fever  [] Chills Cardiac: [] Chest pain   [] Chest pressure   [] Palpitations   [] Shortness of breath when laying flat   [] Shortness of breath with exertion. Vascular:  [] Pain in legs with walking   [] Pain in legs at rest  [] History of DVT   [] Phlebitis   [] Swelling in legs   [] Varicose veins   [] Non-healing ulcers Pulmonary:   [] Uses home oxygen   [] Productive cough   [] Hemoptysis   [] Wheeze  [] COPD   [] Asthma Neurologic:  [] Dizziness   [] Seizures   [] History of stroke   [] History of TIA  [] Aphasia   [] Vissual changes   [] Weakness or numbness in arm   [] Weakness or numbness in leg Musculoskeletal:   [] Joint swelling   [] Joint pain   [] Low back pain Hematologic:  [] Easy bruising  [] Easy bleeding   [] Hypercoagulable state   [] Anemic Gastrointestinal:  [] Diarrhea   [] Vomiting  [] Gastroesophageal reflux/heartburn   [] Difficulty swallowing. Genitourinary:  [x] Chronic kidney disease   [] Difficult urination  [] Frequent urination   [] Blood in urine Skin:  [] Rashes   [] Ulcers  Psychological:  [] History of anxiety   []  History of major depression.  Physical Examination  Vitals:   06/16/21 0840  BP: 110/67  Pulse: 98  Resp: 16  Weight: 135 lb 12.8 oz (61.6 kg)   Body mass index is 21.92 kg/m. Gen: WD/WN, NAD Head: [place]/AT, No temporalis wasting.  Ear/Nose/Throat: Hearing grossly intact, nares w/o erythema or drainage Eyes: PER, EOMI, sclera nonicteric.  Neck: Supple, no gross masses or lesions.  No JVD.  Pulmonary:  Good air movement, no audible wheezing, no use of accessory muscles.  Cardiac: RRR, precordium non-hyperdynamic. Vascular:   left arm AV access is pulsatile Vessel Right Left  Radial Palpable Palpable  Brachial Palpable Palpable  Gastrointestinal: soft, non-distended. No guarding/no peritoneal signs.  Musculoskeletal: M/S 5/5 throughout.  No deformity.  Neurologic: CN 2-12 intact. Pain and light touch intact in extremities.  Symmetrical.   Speech is fluent. Motor exam as listed above. Psychiatric: Judgment intact, Mood & affect appropriate for pt's clinical situation. Dermatologic: No rashes or ulcers noted.  No changes consistent with cellulitis.   CBC Lab Results  Component Value Date   WBC 8.3 07/31/2019   HGB 10.3 (L) 07/31/2019   HCT 30.9 (L) 07/31/2019   MCV 87.7 07/31/2019   PLT 247.0 07/31/2019    BMET    Component Value Date/Time   NA 137 11/01/2019 0856   NA 138 06/23/2018 0000   K 4.3 11/01/2019 0856   K 4.9 06/23/2018 0000   CL 91 (L) 11/01/2019 0856   CO2 33 (H) 11/01/2019 0856   GLUCOSE 158 (H) 11/01/2019 0856   BUN 27 (H) 11/01/2019 0856   BUN 61  06/23/2018 0000   CREATININE 8.20 (HH) 11/01/2019 0856   CREATININE 7.13 06/23/2018 0000   CALCIUM 9.9 11/01/2019 0856   CALCIUM 8.8 06/23/2018 0000   GFRNONAA 9 (L) 02/27/2018 0533   GFRAA 6 06/23/2018 0000   CrCl cannot be calculated (Patient's most recent lab result is older than the maximum 21 days allowed.).  COAG No results found for: INR, PROTIME  Radiology No results found.   Assessment/Plan 1. ESRD (end stage renal disease) on dialysis [facility]) Recommend:  The patient is experiencing increasing problems with their dialysis access.  Patient should have a fistulagram with the intention for intervention.  The intention for intervention is to restore appropriate flow and prevent thrombosis and possible loss of the access.  As well as improve the quality of dialysis therapy.  The risks, benefits and alternative therapies were reviewed in detail with the patient.  All questions were answered.  The patient agrees to proceed with angio/intervention.    The patient will follow up with me in the office after the procedure.    2. Essential hypertension, benign Continue antihypertensive medications as already ordered, these medications have been reviewed and there are no changes at this time.   3. Mixed hyperlipidemia Continue statin as  ordered and reviewed, no changes at this time      Hortencia Pilar, MD  06/16/2021 8:48 AM

## 2021-06-18 ENCOUNTER — Telehealth (INDEPENDENT_AMBULATORY_CARE_PROVIDER_SITE_OTHER): Payer: Self-pay

## 2021-06-18 NOTE — Telephone Encounter (Signed)
Spoke with the patient and she is scheduled with Dr. Delana Meyer for a left arm fistulagram on 06/25/21 with a 2:15 pm arrival time to the [facility]. Pre-procedure instructions were discussed and will be mailed. ?

## 2021-06-25 ENCOUNTER — Encounter: Payer: Self-pay | Admitting: Vascular Surgery

## 2021-06-25 ENCOUNTER — Encounter
Admission: RE | Disposition: A | Discharge status: Home / Self Care | Payer: Self-pay | Source: Home / Self Care | Attending: Vascular Surgery

## 2021-06-25 ENCOUNTER — Ambulatory Visit
Admission: RE | Admit: 2021-06-25 | Discharge: 2021-06-25 | Disposition: A | Discharge status: Home / Self Care | Payer: Medicare Other | Attending: Vascular Surgery | Admitting: Vascular Surgery

## 2021-06-25 ENCOUNTER — Other Ambulatory Visit: Payer: Self-pay

## 2021-06-25 DIAGNOSIS — Y832 Surgical operation with anastomosis, bypass or graft as the cause of abnormal reaction of the patient, or of later complication, without mention of misadventure at the time of the procedure: Secondary | ICD-10-CM | POA: Diagnosis not present

## 2021-06-25 DIAGNOSIS — I120 Hypertensive chronic kidney disease with stage 5 chronic kidney disease or end stage renal disease: Secondary | ICD-10-CM | POA: Diagnosis not present

## 2021-06-25 DIAGNOSIS — T82858A Stenosis of vascular prosthetic devices, implants and grafts, initial encounter: Secondary | ICD-10-CM

## 2021-06-25 DIAGNOSIS — Z87891 Personal history of nicotine dependence: Secondary | ICD-10-CM | POA: Insufficient documentation

## 2021-06-25 DIAGNOSIS — Z992 Dependence on renal dialysis: Secondary | ICD-10-CM

## 2021-06-25 DIAGNOSIS — N186 End stage renal disease: Secondary | ICD-10-CM | POA: Insufficient documentation

## 2021-06-25 HISTORY — PX: A/V FISTULAGRAM: CATH118298

## 2021-06-25 LAB — POTASSIUM (ARMC VASCULAR LAB ONLY): Potassium (ARMC vascular lab): 5.8 mmol/L — ABNORMAL HIGH (ref 3.5–5.1)

## 2021-06-25 SURGERY — A/V FISTULAGRAM
Anesthesia: Moderate Sedation | Laterality: Left

## 2021-06-25 MED ORDER — FENTANYL CITRATE (PF) 100 MCG/2ML IJ SOLN
INTRAMUSCULAR | Status: AC
  Filled 2021-06-25: qty 2

## 2021-06-25 MED ORDER — SODIUM CHLORIDE 0.9 % IV SOLN: INTRAVENOUS | Status: DC

## 2021-06-25 MED ORDER — HEPARIN SODIUM (PORCINE) 1000 UNIT/ML IJ SOLN
INTRAMUSCULAR | Status: AC
  Filled 2021-06-25: qty 10

## 2021-06-25 MED ORDER — HEPARIN SODIUM (PORCINE) 1000 UNIT/ML IJ SOLN
INTRAMUSCULAR | Status: DC | PRN
  Administered 2021-06-25: 3000 [IU] via INTRAVENOUS

## 2021-06-25 MED ORDER — CEFAZOLIN SODIUM-DEXTROSE 1-4 GM/50ML-% IV SOLN
INTRAVENOUS | Status: DC | PRN
  Administered 2021-06-25: 1 g via INTRAVENOUS

## 2021-06-25 MED ORDER — IODIXANOL 320 MG/ML IV SOLN
INTRAVENOUS | Status: DC | PRN
  Administered 2021-06-25 (×2): 20 mL

## 2021-06-25 MED ORDER — METHYLPREDNISOLONE SODIUM SUCC 125 MG IJ SOLR: 125.0000 mg | Freq: Once | INTRAMUSCULAR | Status: DC | PRN

## 2021-06-25 MED ORDER — MIDAZOLAM HCL 2 MG/2ML IJ SOLN
INTRAMUSCULAR | Status: DC | PRN
  Administered 2021-06-25 (×2): 1 mg via INTRAVENOUS

## 2021-06-25 MED ORDER — DIPHENHYDRAMINE HCL 50 MG/ML IJ SOLN: 50.0000 mg | Freq: Once | INTRAMUSCULAR | Status: DC | PRN

## 2021-06-25 MED ORDER — CEFAZOLIN SODIUM-DEXTROSE 1-4 GM/50ML-% IV SOLN: 1.0000 g | INTRAVENOUS | Status: DC

## 2021-06-25 MED ORDER — CEFAZOLIN SODIUM-DEXTROSE 1-4 GM/50ML-% IV SOLN
INTRAVENOUS | Status: AC
  Filled 2021-06-25: qty 50

## 2021-06-25 MED ORDER — ONDANSETRON HCL 4 MG/2ML IJ SOLN: 4.0000 mg | Freq: Four times a day (QID) | INTRAMUSCULAR | Status: DC | PRN

## 2021-06-25 MED ORDER — FAMOTIDINE 20 MG PO TABS: 40.0000 mg | ORAL_TABLET | Freq: Once | ORAL | Status: DC | PRN

## 2021-06-25 MED ORDER — MIDAZOLAM HCL 2 MG/2ML IJ SOLN
INTRAMUSCULAR | Status: AC
  Filled 2021-06-25: qty 2

## 2021-06-25 MED ORDER — HYDROMORPHONE HCL 1 MG/ML IJ SOLN: 1.0000 mg | Freq: Once | INTRAMUSCULAR | Status: DC | PRN

## 2021-06-25 MED ORDER — MIDAZOLAM HCL 2 MG/ML PO SYRP: 8.0000 mg | ORAL_SOLUTION | Freq: Once | ORAL | Status: DC | PRN

## 2021-06-25 MED ORDER — FENTANYL CITRATE (PF) 100 MCG/2ML IJ SOLN
INTRAMUSCULAR | Status: DC | PRN
  Administered 2021-06-25 (×2): 50 ug via INTRAVENOUS

## 2021-06-25 SURGICAL SUPPLY — 21 items
BALLN LUTONIX 018 4X40X130 (BALLOONS) ×2
BALLN LUTONIX 018 5X40X130 (BALLOONS) ×2
BALLN ULTRVRSE 3X40X150 (BALLOONS) ×2
BALLN ULTRVRSE 3X40X150 OTW (BALLOONS) ×1
BALLOON LUTONIX 018 4X40X130 (BALLOONS) IMPLANT
BALLOON LUTONIX 018 5X40X130 (BALLOONS) IMPLANT
BALLOON ULTRVRSE 3X40X150 OTW (BALLOONS) IMPLANT
CATH BEACON 5 .035 40 KMP TP (CATHETERS) IMPLANT
CATH BEACON 5 .038 40 KMP TP (CATHETERS) ×2
COVER PROBE U/S 5X48 (MISCELLANEOUS) ×1 IMPLANT
DEVICE TORQUE .025-.038 (MISCELLANEOUS) ×1 IMPLANT
DRAPE BRACHIAL (DRAPES) ×1 IMPLANT
GUIDEWIRE ANGLED .035 180CM (WIRE) ×1 IMPLANT
KIT ENCORE 26 ADVANTAGE (KITS) ×1 IMPLANT
NDL ENTRY 21GA 7CM ECHOTIP (NEEDLE) IMPLANT
NEEDLE ENTRY 21GA 7CM ECHOTIP (NEEDLE) ×2 IMPLANT
PACK ANGIOGRAPHY (CUSTOM PROCEDURE TRAY) ×2 IMPLANT
SET INTRO CAPELLA COAXIAL (SET/KITS/TRAYS/PACK) ×1 IMPLANT
SHEATH BRITE TIP 6FRX5.5 (SHEATH) ×1 IMPLANT
SUT MNCRL AB 4-0 PS2 18 (SUTURE) ×1 IMPLANT
WIRE G 018X200 V18 (WIRE) ×1 IMPLANT

## 2021-06-25 NOTE — Op Note (Signed)
Kimberly Frost, Kimberly Frost administered under my direct supervision by the interventional radiology RN. IV Versed plus fentanyl were utilized. Continuous ECG, pulse oximetry and blood pressure Frost monitored throughout the entire procedure.  Conscious sedation Frost for a total of 35 minutes.  ESTIMATED BLOOD LOSS: minimal  FINDING(S): Stricture of the AV graft  SPECIMEN(S):  None  CONTRAST: 20 cc  FLUOROSCOPY TIME: 2.4 minutes  INDICATIONS: Kimberly Frost is a 71 y.o. female who  presents with malfunctioning left brachiocephalic fistula AV access.  The patient is scheduled for angiography with possible intervention of the AV access.  The patient is aware the risks include but are not limited to: bleeding, infection, thrombosis of the cannulated access, and possible anaphylactic reaction to the contrast.  The patient acknowledges if the access can not be salvaged a tunneled catheter will be needed and will be placed during this procedure.  The patient is aware of the risks of the procedure and elects to proceed with the angiogram and intervention.  DESCRIPTION: After full informed written consent Frost obtained, the patient Frost brought back to the Special Procedure suite and placed supine position.  Appropriate cardiopulmonary monitors were placed.  The left arm Frost prepped and draped in the standard fashion.  Appropriate timeout is called. The left brachiocephalic fistula Frost cannulated with a micropuncture needle at a site more proximal in the arm in a retrograde direction.  Cannulation Frost  performed with ultrasound guidance. Ultrasound Frost placed in a sterile sleeve, the AV access Frost interrogated and noted to be echolucent and compressible indicating patency. Image Frost recorded for the permanent record. The puncture is performed under continuous ultrasound visualization.   The microwire Frost advanced and the needle Frost exchanged for  a microsheath.  The J-wire Frost then advanced and a 6 Fr sheath inserted.  Hand injections were completed to image the access from the arterial anastomosis through the entire access.  The central venous structures were also imaged by hand injections.  Based on the images,  3000 units of heparin Frost given and a glide wire Frost negotiated through the stricture within the peripheral portion of the fistula.  An 3 mm x 40 mm balloon balloon then a 4 mm x 40 mm balloon Frost used.  Inflation Frost to 10 atm for approximately 1 minute.  Glidewire Frost then negotiated to the brachial artery followed by the Kumpe catheter.  Follow-up imaging Frost obtained which demonstrated approximately 30 to 40% residual stenosis.  A V18 wire Frost reintroduced and a 5 mm x 40 mm Lutonix drug-eluting balloon Frost advanced across the stenosis extending slightly into the brachial artery and inflated to 10 atm for approximately 1 minute.  The Kumpe catheter Frost reintroduced and follow-up imaging Frost performed which demonstrated less than 5% residual stenosis.  Follow-up imaging demonstrates complete resolution of the stricture with rapid flow of contrast through the graft, the central venous  anatomy is preserved.  A 4-0 Monocryl purse-string suture Frost sewn around the sheath.  The sheath Frost removed and light pressure Frost applied.  A sterile bandage Frost applied to the puncture site.  Interpretation: Initial imaging with the Kumpe catheter and the brachial artery demonstrates there is a greater than 90% stenosis right at the anastomosis of the cephalic vein to the brachial artery.  Visualized portions  of the brachial artery are widely patent.  Above this stricture at the cephalic vein is widely patent mildly dilated where they have been accessing it.  There is a previously placed Viabahn stent in the more proximal portion of the vein which is widely patent.  The cephalic subclavian confluence and central veins are all widely patent.  Following angioplasty first to 3 mm then 4 mm then 5 mm there is now less than 5% residual stenosis and improved flow within the fistula.  COMPLICATIONS: None  CONDITION: Kimberly Frost, M.D [facility] Office: [phone]  06/25/2021 4:23 PM

## 2021-06-25 NOTE — Interval H&P Note (Signed)
History and Physical Interval Note:  06/25/2021 3:13 PM  Kimberly Frost  has presented today for surgery, with the diagnosis of LT Arm Fistulagram   ESRD.  The various methods of treatment have been discussed with the patient and family. After consideration of risks, benefits and other options for treatment, the patient has consented to  Procedure(s): A/V Fistulagram (Left) as a surgical intervention.  The patient's history has been reviewed, patient examined, no change in status, stable for surgery.  I have reviewed the patient's chart and labs.  Questions were answered to the patient's satisfaction.     Hortencia Pilar

## 2021-06-26 ENCOUNTER — Encounter: Payer: Self-pay | Admitting: Vascular Surgery

## 2021-07-08 ENCOUNTER — Ambulatory Visit (HOSPITAL_COMMUNITY): Payer: Medicare Other

## 2021-07-09 ENCOUNTER — Other Ambulatory Visit: Payer: Medicare Other

## 2021-07-09 ENCOUNTER — Ambulatory Visit (HOSPITAL_COMMUNITY)
Admission: RE | Admit: 2021-07-09 | Discharge: 2021-07-09 | Disposition: A | Discharge status: Home / Self Care | Payer: Medicare Other | Source: Ambulatory Visit | Attending: Radiation Oncology | Admitting: Radiation Oncology

## 2021-07-09 DIAGNOSIS — C3491 Malignant neoplasm of unspecified part of right bronchus or lung: Secondary | ICD-10-CM | POA: Insufficient documentation

## 2021-07-10 ENCOUNTER — Other Ambulatory Visit (INDEPENDENT_AMBULATORY_CARE_PROVIDER_SITE_OTHER): Payer: Self-pay | Admitting: Vascular Surgery

## 2021-07-10 ENCOUNTER — Ambulatory Visit: Payer: Self-pay | Admitting: Radiation Oncology

## 2021-07-10 DIAGNOSIS — Z9862 Peripheral vascular angioplasty status: Secondary | ICD-10-CM

## 2021-07-10 DIAGNOSIS — N186 End stage renal disease: Secondary | ICD-10-CM

## 2021-07-11 ENCOUNTER — Ambulatory Visit: Payer: Medicare Other | Admitting: Family

## 2021-07-11 NOTE — Progress Notes (Signed)
Radiation Oncology         [phone] ________________________________  Name: Kimberly Frost MRN: [id number]  Date: 07/14/2021  DOB: Dec 21, 1950  Follow-Up Visit Note  CC: Pcp, No  Kimberly Lively, MD  No diagnosis found.  Diagnosis: The primary encounter diagnosis was NSCLC of right lung ([place]). A diagnosis of NSCLC of lower lobe ([name]) was also pertinent to this visit.   Stage IB (cT2, N0, M0) NSCLC of the RUL  Interval Since Last Radiation: 4 months and 6 days   Intent: Curative  Radiation Treatment Dates: 02/24/2021 through 03/10/2021 Site Technique Total Dose (Gy) Dose per Fx (Gy) Completed Fx Beam Energies  Lung, Right: Lung_R IMRT 50/50 5 10/10 6XFFF    Narrative:  The patient returns today for routine follow-up and to review recent imaging, she was last seen here for follow up on 04/07/21. Since her last visit, the patient underwent A/V fistulagram on 06/25/21 to manage a malfunctioning left brachiocephalic fistula AV access.     Her most recent chest CT on 07/09/21 demonstrated an interval decrease in size of a central suprahilar right upper lobe mass, consistent with a treatment response. A new adjacent crescent and bandlike heterogeneous opacity and consolidation was appreciated in the area about this nodule; consistent with evolving radiation pneumonitis and fibrosis. Additionally, a new superior peripheral subpleural mass-like opacity of the right apex was appreciated, measuring 2.6 x 2.4 cm, and highly concerning for metastasis or synchronous primary lung malignancy,       given that this finding was more focal and masslike than expected for radiation fibrosis. CT also showed an unchanged nonspecific small nodule of the right lung base.           ***  Allergies:  has No Known Allergies.  Meds: Current Outpatient Medications  Medication Sig Dispense Refill   acetaminophen (TYLENOL) 500 MG tablet Take 1,000 mg by mouth every 6 (six) hours as needed for moderate pain  or headache.     atorvastatin (LIPITOR) 20 MG tablet TAKE 1 TABLET BY MOUTH EVERY DAY 90 tablet 0   furosemide (LASIX) 40 MG tablet Take 40 mg by mouth 2 (two) times daily. (Patient not taking: Reported on 02/10/2021)     gabapentin (NEURONTIN) 100 MG capsule Take 100 mg by mouth at bedtime.     iron sucrose in sodium chloride 0.9 % 100 mL Iron Sucrose (Venofer)     midodrine (PROAMATINE) 10 MG tablet Take by mouth.     multivitamin (RENA-VIT) TABS tablet Take 1 tablet by mouth daily.     Sodium Sulfate-Mag Sulfate-KCl (SUTAB) [phone] MG TABS At 5 PM take 12 tablets using the 8 oz cup provided in the kit drinking 5 cups of water and 5 hours before your procedure repeat the same process. (Patient not taking: Reported on 02/10/2021) 24 tablet 0   VELPHORO 500 MG chewable tablet Chew 1,000 mg by mouth See admin instructions. Take 1000 mg with meals and 500 mg with snacks     No current facility-administered medications for this encounter.    Physical Findings: The patient is in no acute distress. Patient is alert and oriented.  vitals were not taken for this visit. .  No significant changes. Lungs are clear to auscultation bilaterally. Heart has regular rate and rhythm. No palpable cervical, supraclavicular, or axillary adenopathy. Abdomen soft, non-tender, normal bowel sounds. ***   Lab Findings: Lab Results  Component Value Date   WBC 8.3 07/31/2019   HGB 10.3 (L)  07/31/2019   HCT 30.9 (L) 07/31/2019   MCV 87.7 07/31/2019   PLT 247.0 07/31/2019    Radiographic Findings: CT CHEST WO CONTRAST  Result Date: 07/10/2021 CLINICAL DATA:  Right upper lobe non-small cell lung cancer, assess treatment response, status post XRT * Tracking Code: BO * EXAM: CT CHEST WITHOUT CONTRAST TECHNIQUE: Multidetector CT imaging of the chest was performed following the standard protocol without IV contrast. RADIATION DOSE REDUCTION: This exam was performed according to the departmental dose-optimization  program which includes automated exposure control, adjustment of the mA and/or kV according to patient size and/or use of iterative reconstruction technique. COMPARISON:  PET-CT, 01/15/2021, CT chest, 10/30/2020 FINDINGS: Cardiovascular: Aortic atherosclerosis. Mild cardiomegaly. Left coronary artery calcifications. No pericardial effusion. Mediastinum/Nodes: No enlarged mediastinal, hilar, or axillary lymph nodes. Small hiatal hernia. Thyroid gland, trachea, and esophagus demonstrate no significant findings. Lungs/Pleura: Mild centrilobular and paraseptal emphysema. Interval decrease in size of a central suprahilar right upper lobe mass measuring 2.0 x 1.7 cm, previously 3.2 x 2.9 cm (series 5, image 43). There is new, adjacent crescent and bandlike heterogeneous opacity and consolidation about this nodule. There is a new peripheral subpleural masslike opacity of the right apex superiorly, measuring 2.6 x 2.4 cm (series 5, image 34). Unchanged small nodule of the right lung base measuring 0.5 cm (series 5, image 111). No pleural effusion or pneumothorax. Upper Abdomen: No acute abnormality. Musculoskeletal: No chest wall abnormality. No suspicious osseous lesions identified. IMPRESSION: 1. Interval decrease in size of a central suprahilar right upper lobe mass, consistent with treatment response. New, adjacent crescent and bandlike heterogeneous opacity and consolidation about this nodule consistent with evolving radiation pneumonitis and fibrosis. 2. Additionally, there is a new peripheral subpleural masslike opacity of the right apex located superiorly, measuring 2.6 x 2.4 cm. This is more focal and masslike than expected for radiation fibrosis and is highly concerning for metastasis or synchronous primary lung malignancy. PET-CT may be helpful to assess for metabolic activity. 3. Unchanged nonspecific small nodule of the right lung base. Attention on follow-up. 4. Emphysema. 5. Cardiomegaly and coronary artery  disease. Aortic Atherosclerosis (ICD10-I70.0) and Emphysema (ICD10-J43.9). Electronically Signed   By: Delanna Ahmadi M.D.   On: 07/10/2021 13:57   PERIPHERAL VASCULAR CATHETERIZATION  Result Date: 06/25/2021 See surgical note for result.  VAS US DUPLEX DIALYSIS ACCESS (AVF, AVG)  Result Date: 06/16/2021 DIALYSIS ACCESS Patient Name:  JASLEEN RIEPE  Date of Exam:   06/16/2021 Medical Rec #: [id number]        Accession #:    [id number] Date of Birth: 17-Feb-1950        Patient Gender: F Patient Age:   32 years Exam Location:  [facility] Procedure:      VAS US DUPLEX DIALYSIS ACCESS (AVF, AVG) Referring Phys: Hortencia Pilar --------------------------------------------------------------------------------  Reason for Exam: Difficulty dialyzing. Access Site: Left Upper Extremity. Access Type: Brachial-cephalic AVF. History: 04/22/2020 Left Brachial/Cephalic AVF cannulation. Chronic stenosis. Comparison Study: 12/23/2020 Performing Technologist: Concha Norway RVT  Examination Guidelines: A complete evaluation includes B-mode imaging, spectral Doppler, color Doppler, and power Doppler as needed of all accessible portions of each vessel. Unilateral testing is considered an integral part of a complete examination. Limited examinations for reoccurring indications may be performed as noted.  Findings: +--------------------+----------+-----------------+--------------------------+ AVF                 PSV (cm/s)Flow Vol (mL/min)         Comments          +--------------------+----------+-----------------+--------------------------+  Native artery inflow   172           905                                  +--------------------+----------+-----------------+--------------------------+ AVF Anastomosis        583                     .09cm anastomosis diameter +--------------------+----------+-----------------+--------------------------+  +-------------+----------+-------------+----------+--------+  OUTFLOW VEIN PSV (cm/s)Diameter (cm)Depth (cm)Describe +-------------+----------+-------------+----------+--------+ Axillary vein   352                                    +-------------+----------+-------------+----------+--------+ Confluence      232                                    +-------------+----------+-------------+----------+--------+ Prox UA         166                                    +-------------+----------+-------------+----------+--------+ Mid UA          101                                    +-------------+----------+-------------+----------+--------+ Dist UA         169                                    +-------------+----------+-------------+----------+--------+  +---------------+------------+----------+---------+--------+------------------+                  Diameter  Depth (cm)Branching  PSV      Flow Volume                        (cm)                        (cm/s)      (ml/min)      +---------------+------------+----------+---------+--------+------------------+ Left Rad Art                                     83                      Dis                                                                      +---------------+------------+----------+---------+--------+------------------+  Summary: Patent left brachiocephalic AVF with narrowed diameter at .09cm, and elevated velocities at the confluence which has been seen previously. Flow volume has decreased compared to previous study.  *See table(s) above for measurements and observations.  Diagnosing physician: Hortencia Pilar MD Electronically signed by Hortencia Pilar MD on 06/16/2021 at 4:44:27 PM.   --------------------------------------------------------------------------------  Final     Impression:  The primary encounter diagnosis was NSCLC of right lung ([place]). A diagnosis of NSCLC of lower lobe ([place]) was also pertinent to this visit.   Stage IB (cT2, N0, M0) NSCLC of  the RUL  The patient is recovering from the effects of radiation.  ***  Plan:  ***   *** minutes of total time was spent for this patient encounter, including preparation, face-to-face counseling with the patient and coordination of care, physical exam, and documentation of the encounter. ____________________________________  Blair Promise, PhD, MD  This document serves as a record of services personally performed by Gery Pray, MD. It was created on his behalf by Roney Mans, a trained medical scribe. The creation of this record is based on the scribe's personal observations and the provider's statements to them. This document has been checked and approved by the attending provider.

## 2021-07-14 ENCOUNTER — Ambulatory Visit
Admission: RE | Admit: 2021-07-14 | Discharge: 2021-07-14 | Disposition: A | Discharge status: Home / Self Care | Payer: Medicare Other | Source: Ambulatory Visit | Attending: Radiation Oncology | Admitting: Radiation Oncology

## 2021-07-14 ENCOUNTER — Other Ambulatory Visit: Payer: Self-pay

## 2021-07-14 ENCOUNTER — Encounter: Payer: Self-pay | Admitting: Radiation Oncology

## 2021-07-14 DIAGNOSIS — I517 Cardiomegaly: Secondary | ICD-10-CM | POA: Diagnosis not present

## 2021-07-14 DIAGNOSIS — J432 Centrilobular emphysema: Secondary | ICD-10-CM | POA: Insufficient documentation

## 2021-07-14 DIAGNOSIS — Z79899 Other long term (current) drug therapy: Secondary | ICD-10-CM | POA: Insufficient documentation

## 2021-07-14 DIAGNOSIS — C3491 Malignant neoplasm of unspecified part of right bronchus or lung: Secondary | ICD-10-CM

## 2021-07-14 DIAGNOSIS — I700 Atherosclerosis of aorta: Secondary | ICD-10-CM | POA: Insufficient documentation

## 2021-07-14 DIAGNOSIS — C3411 Malignant neoplasm of upper lobe, right bronchus or lung: Secondary | ICD-10-CM | POA: Insufficient documentation

## 2021-07-14 DIAGNOSIS — I2510 Atherosclerotic heart disease of native coronary artery without angina pectoris: Secondary | ICD-10-CM | POA: Insufficient documentation

## 2021-07-14 DIAGNOSIS — Z923 Personal history of irradiation: Secondary | ICD-10-CM | POA: Diagnosis not present

## 2021-07-14 NOTE — Progress Notes (Signed)
Kimberly Frost is here today for follow up post radiation to the lung.  Lung Side: Right,patient completed treatment on 03/10/21  Does the patient complain of any of the following: Pain: No Shortness of breath w/wo exertion: Yes on exertion. Patient states mostly after dialysis.  Cough: No Hemoptysis: No Pain with swallowing: No Swallowing/choking concerns: No Appetite: Good Energy Level: continues to have low energy due to dialysis.  Post radiation skin Changes: No    Additional comments if applicable:   BP 201/00   Pulse 97   Temp (!) 97.3 F (36.3 C)   Resp 18   Wt 136 lb 9.6 oz (62 kg)   SpO2 100%   BMI 22.05 kg/m

## 2021-07-15 ENCOUNTER — Telehealth: Payer: Self-pay | Admitting: *Deleted

## 2021-07-15 NOTE — Telephone Encounter (Signed)
CALLED PATIENT TO INFORM OF PET SCAN FOR 07-21-21- ARRIVAL TIME- 8 AM @ [facility], PATIENT TO HAVE WATER ONLY- 6 HRS. PRIOR TO TEST, PATIENT TO RECEIVE RESULTS FROM DR. KINARD ON 07-24-21 @ 10:30 AM, SPOKE WITH PATIENT'S HUSBAND JOSEPH AND HE IS AWARE OF THESE APPTS. AND THE INSTRUCTIONS

## 2021-07-21 ENCOUNTER — Encounter (HOSPITAL_COMMUNITY)
Admission: RE | Admit: 2021-07-21 | Discharge: 2021-07-21 | Disposition: A | Discharge status: Home / Self Care | Payer: Medicare Other | Source: Ambulatory Visit | Attending: Radiation Oncology | Admitting: Radiation Oncology

## 2021-07-21 ENCOUNTER — Ambulatory Visit (INDEPENDENT_AMBULATORY_CARE_PROVIDER_SITE_OTHER): Payer: Medicare Other | Admitting: Vascular Surgery

## 2021-07-21 ENCOUNTER — Ambulatory Visit (INDEPENDENT_AMBULATORY_CARE_PROVIDER_SITE_OTHER): Payer: Medicare Other

## 2021-07-21 DIAGNOSIS — C3491 Malignant neoplasm of unspecified part of right bronchus or lung: Secondary | ICD-10-CM | POA: Diagnosis present

## 2021-07-21 LAB — GLUCOSE, CAPILLARY: Glucose-Capillary: 118 mg/dL — ABNORMAL HIGH (ref 70–99)

## 2021-07-21 MED ORDER — FLUDEOXYGLUCOSE F - 18 (FDG) INJECTION
6.7900 | Freq: Once | INTRAVENOUS | Status: AC | PRN
  Administered 2021-07-21: 6.79 via INTRAVENOUS

## 2021-07-23 NOTE — Progress Notes (Signed)
Radiation Oncology         [phone] ________________________________  Name: Kimberly Frost MRN: [id number]  Date: 07/24/2021  DOB: [birth year]  Follow-Up Visit Note  CC: Pcp, No  [name], MD    ICD-10-CM   1. NSCLC of right lung ([place])  C34.91       Diagnosis: The primary encounter diagnosis was NSCLC of right lung ([place]). A diagnosis of NSCLC of lower lobe ([place]) was also pertinent to this visit.   Stage IB (cT2, N0, M0) NSCLC of the RUL  Interval Since Last Radiation: 4 months and 16 days   Intent: Curative  Radiation Treatment Dates: 02/24/2021 through 03/10/2021 Site Technique Total Dose (Gy) Dose per Fx (Gy) Completed Fx Beam Energies  Lung, Right: Lung_R IMRT 50/50 5 10/10 6XFFF    Narrative:  The patient returns today for routine follow-up and to review recent imaging, she was last seen here for follow up on 07/14/21. To review from our last visit, chest CT reviewed at that time showed concern for a new lesion in the right upper lung area in separate region from her previous UHRT. Given her renal situation making her ineligible for a contrast CT, I recommended proceeding with a PET scan for further evaluation.   Subsequent PET on 07/21/21 demonstrated a hypermetabolic masslike opacity in the right upper lobe measuring 2.5 cm concerning for metastasis or synchronous primary lung cancer. A decrease in low level hypermetabolic activity in the central suprahilar right upper lobe mass was also appreciated, consistent with a treatment response. PET also showed low-level hypermetabolic activity within the crescentic band like heterogeneous opacity / consolidation arising around the periphery of the treated right upper lobe pulmonary nodule, consistent with evolving radiation pneumonitis/fibrosis. PET otherwise showed no evidence of hypermetabolic thoracic adenopathy or hypermetabolic disease in the abdomen or pelvis.  On evaluation today she reports no new medical issues.   She denies any pain within the chest area significant cough or hemoptysis.  She continues to have significant fatigue related to her dialysis.  Allergies:  has No Known Allergies.  Meds: Current Outpatient Medications  Medication Sig Dispense Refill   acetaminophen (TYLENOL) 500 MG tablet Take 1,000 mg by mouth every 6 (six) hours as needed for moderate pain or headache.     atorvastatin (LIPITOR) 20 MG tablet TAKE 1 TABLET BY MOUTH EVERY DAY 90 tablet 0   furosemide (LASIX) 40 MG tablet Take 40 mg by mouth 2 (two) times daily. (Patient not taking: Reported on 02/10/2021)     gabapentin (NEURONTIN) 100 MG capsule Take 100 mg by mouth at bedtime.     iron sucrose in sodium chloride 0.9 % 100 mL Iron Sucrose (Venofer)     midodrine (PROAMATINE) 10 MG tablet Take by mouth.     multivitamin (RENA-VIT) TABS tablet Take 1 tablet by mouth daily.     Sodium Sulfate-Mag Sulfate-KCl (SUTAB) [phone] MG TABS At 5 PM take 12 tablets using the 8 oz cup provided in the kit drinking 5 cups of water and 5 hours before your procedure repeat the same process. 24 tablet 0   VELPHORO 500 MG chewable tablet Chew 1,000 mg by mouth See admin instructions. Take 1000 mg with meals and 500 mg with snacks     No current facility-administered medications for this encounter.    Physical Findings: The patient is in no acute distress. Patient is alert and oriented.  Accompanied by her husband on evaluation today.  Remains in wheelchair.  weight is 133 lb 9.6 oz (60.6 kg). Her temperature is 98.5 F (36.9 C). Her blood pressure is 81/56 (abnormal) and her pulse is 83. Her respiration is 19 and oxygen saturation is 95%. .  No significant changes. Lungs are clear to auscultation bilaterally. Heart has regular rate and rhythm. No palpable cervical, supraclavicular, or axillary adenopathy. Abdomen soft, non-tender, normal bowel sounds.    Lab Findings: Lab Results  Component Value Date   WBC 8.3 07/31/2019   HGB 10.3  (L) 07/31/2019   HCT 30.9 (L) 07/31/2019   MCV 87.7 07/31/2019   PLT 247.0 07/31/2019    Radiographic Findings: NM PET Image Restag (PS) Skull Base To Thigh  Result Date: 07/22/2021 CLINICAL DATA:  Subsequent treatment strategy for right upper lobe lung cancer. EXAM: NUCLEAR MEDICINE PET SKULL BASE TO THIGH TECHNIQUE: 6.79 mCi F-18 FDG was injected intravenously. Full-ring PET imaging was performed from the skull base to thigh after the radiotracer. CT data was obtained and used for attenuation correction and anatomic localization. Fasting blood glucose: 118 mg/dl COMPARISON:  Multiple priors including most recent chest CT July 09, 2021 FINDINGS: Mediastinal blood pool activity: SUV max 2.42 Liver activity: SUV max NA NECK: No hypermetabolic cervical adenopathy. Incidental CT findings: Carotid artery calcifications. CHEST: Low-level hypermetabolic activity associated with the central suprahilar right upper lobe mass which now measures 1.7 x 1.6 cm on image 22/7 with a max SUV of 3.7 previously measuring 2.0 x 1.7 cm. Low-level metabolic activity associated with the crescentic band like heterogeneous opacity/consolidation located about the periphery of this nodule for instance on image 21/7 with a max SUV of 4.1. Hypermetabolic right upper lobe peripheral subpleural masslike opacity which arises along the superolateral aspect of the crescentic band measuring 2.5 x 2.0 cm on image 17/7 with a max SUV of 9.2 previously measuring 2.6 x 2.4 cm. Incidental CT findings: Stable 5 mm pulmonary nodule in the right lung base which is below the resolution of PET-CT. Centrilobular and paraseptal emphysema. Aortic atherosclerosis. Coronary artery calcifications. ABDOMEN/PELVIS: No abnormal hypermetabolic activity within the liver, pancreas, adrenal glands, or spleen. No hypermetabolic lymph nodes in the abdomen or pelvis. Incidental CT findings: Leiomyomatous uterus. Colonic diverticulosis without findings of acute  diverticulitis. Aortic atherosclerosis. SKELETON: No focal hypermetabolic activity to suggest skeletal metastasis. Incidental CT findings: Multilevel degenerative changes spine with multifocal degenerative joint disease. IMPRESSION: 1. Hypermetabolic masslike opacity in the right upper lobe measuring 2.5 cm is concerning metastasis or synchronous primary lung cancer, consider further evaluation with direct tissue sampling. 2. Low-level hypermetabolic activity in the central suprahilar right upper lobe mass which has been decreasing in size over imaging studies consistent with treatment response. 3. Low-level hypermetabolic activity within the crescentic band like heterogeneous opacity/consolidation arising around the periphery the treated right upper lobe pulmonary nodule, imaging findings most consistent with evolving radiation pneumonitis/fibrosis. 4. No hypermetabolic thoracic adenopathy. 5. No hypermetabolic metastatic disease in the abdomen or pelvis. Electronically Signed   By: Dahlia Bailiff M.D.   On: 07/22/2021 11:46   CT CHEST WO CONTRAST  Result Date: 07/10/2021 CLINICAL DATA:  Right upper lobe non-small cell lung cancer, assess treatment response, status post XRT * Tracking Code: BO * EXAM: CT CHEST WITHOUT CONTRAST TECHNIQUE: Multidetector CT imaging of the chest was performed following the standard protocol without IV contrast. RADIATION DOSE REDUCTION: This exam was performed according to the departmental dose-optimization program which includes automated exposure control, adjustment of the mA and/or kV according to patient size and/or use  of iterative reconstruction technique. COMPARISON:  PET-CT, 01/15/2021, CT chest, 10/30/2020 FINDINGS: Cardiovascular: Aortic atherosclerosis. Mild cardiomegaly. Left coronary artery calcifications. No pericardial effusion. Mediastinum/Nodes: No enlarged mediastinal, hilar, or axillary lymph nodes. Small hiatal hernia. Thyroid gland, trachea, and esophagus  demonstrate no significant findings. Lungs/Pleura: Mild centrilobular and paraseptal emphysema. Interval decrease in size of a central suprahilar right upper lobe mass measuring 2.0 x 1.7 cm, previously 3.2 x 2.9 cm (series 5, image 43). There is new, adjacent crescent and bandlike heterogeneous opacity and consolidation about this nodule. There is a new peripheral subpleural masslike opacity of the right apex superiorly, measuring 2.6 x 2.4 cm (series 5, image 34). Unchanged small nodule of the right lung base measuring 0.5 cm (series 5, image 111). No pleural effusion or pneumothorax. Upper Abdomen: No acute abnormality. Musculoskeletal: No chest wall abnormality. No suspicious osseous lesions identified. IMPRESSION: 1. Interval decrease in size of a central suprahilar right upper lobe mass, consistent with treatment response. New, adjacent crescent and bandlike heterogeneous opacity and consolidation about this nodule consistent with evolving radiation pneumonitis and fibrosis. 2. Additionally, there is a new peripheral subpleural masslike opacity of the right apex located superiorly, measuring 2.6 x 2.4 cm. This is more focal and masslike than expected for radiation fibrosis and is highly concerning for metastasis or synchronous primary lung malignancy. PET-CT may be helpful to assess for metabolic activity. 3. Unchanged nonspecific small nodule of the right lung base. Attention on follow-up. 4. Emphysema. 5. Cardiomegaly and coronary artery disease. Aortic Atherosclerosis (ICD10-I70.0) and Emphysema (ICD10-J43.9). Electronically Signed   By: Delanna Ahmadi M.D.   On: 07/10/2021 13:57   PERIPHERAL VASCULAR CATHETERIZATION  Result Date: 06/25/2021 See surgical note for result.   Impression: The primary encounter diagnosis was NSCLC of right lung ([place]). A diagnosis of NSCLC of lower lobe ([place]) was also pertinent to this visit.   Stage IB (cT2, N0, M0) NSCLC of the RUL  Recent PET scan shows favorable  response to her recent course of ultra hypofractionated accelerated radiation therapy for her right perihilar lesion.  PET scan does confirm a new lesion in the upper right lung peripheral in location.  We discussed options for management of this new lesion.  We discussed potential bronchoscopy and biopsy.  We discussed that this could potentially affect management issues especially if this is a small cell lung cancer.  She does not does wish to consider potential chemotherapy for management of this new lesion given her multiple medical issues.  She does not wish to proceed with bronchoscopy to confirm malignancy.  I discussed with her that we could treat this area with radiation therapy but there would be some overlap with her previous radiation fields increasing the risk for damage to her right upper lung area.  This potentially could result in bleeding and death.  She could also develop significant radiation pneumonitis in this area which potentially could also result in significant complications leading to death.  We discussed that this could could be a metastasis or a second primary but that additional radiation therapy could be used to treat either of these situations.  We also discussed no active treatment for this area but understanding that the lesion will enlarge rapidly given his quick onset since her treatment.  I reviewed her treatment planning CT scan carefully and this nothing abnormal in this region just 4 months ago.  After careful consideration the patient would like to proceed with additional radiation therapy for management of this PET positive lesion  in the right upper lobe.  She understands the treatment well after going through radiation therapy earlier this year.   Plan: She will return for planning early next week.  Anticipate 10 fractions directed at the new lesion in the right upper lobe.  We will use SBRT planning and set up techniques and will proceed with SBRT if technically  feasible.   35 minutes of total time was spent for this patient encounter, including preparation, face-to-face counseling with the patient and coordination of care, physical exam, and documentation of the encounter. ____________________________________  Blair Promise, PhD, MD  This document serves as a record of services personally performed by Gery Pray, MD. It was created on his behalf by Roney Mans, a trained medical scribe. The creation of this record is based on the scribe's personal observations and the provider's statements to them. This document has been checked and approved by the attending provider.

## 2021-07-24 ENCOUNTER — Ambulatory Visit
Admission: RE | Admit: 2021-07-24 | Discharge: 2021-07-24 | Disposition: A | Discharge status: Home / Self Care | Payer: Medicare Other | Source: Ambulatory Visit | Attending: Radiation Oncology | Admitting: Radiation Oncology

## 2021-07-24 ENCOUNTER — Other Ambulatory Visit: Payer: Self-pay

## 2021-07-24 ENCOUNTER — Encounter: Payer: Self-pay | Admitting: Radiation Oncology

## 2021-07-24 DIAGNOSIS — Z79899 Other long term (current) drug therapy: Secondary | ICD-10-CM | POA: Insufficient documentation

## 2021-07-24 DIAGNOSIS — C3411 Malignant neoplasm of upper lobe, right bronchus or lung: Secondary | ICD-10-CM | POA: Diagnosis present

## 2021-07-24 DIAGNOSIS — C3431 Malignant neoplasm of lower lobe, right bronchus or lung: Secondary | ICD-10-CM | POA: Diagnosis not present

## 2021-07-24 DIAGNOSIS — I517 Cardiomegaly: Secondary | ICD-10-CM | POA: Insufficient documentation

## 2021-07-24 DIAGNOSIS — J432 Centrilobular emphysema: Secondary | ICD-10-CM | POA: Diagnosis not present

## 2021-07-24 DIAGNOSIS — I2510 Atherosclerotic heart disease of native coronary artery without angina pectoris: Secondary | ICD-10-CM | POA: Diagnosis not present

## 2021-07-24 DIAGNOSIS — Z923 Personal history of irradiation: Secondary | ICD-10-CM | POA: Insufficient documentation

## 2021-07-24 DIAGNOSIS — R918 Other nonspecific abnormal finding of lung field: Secondary | ICD-10-CM | POA: Diagnosis present

## 2021-07-24 DIAGNOSIS — R5383 Other fatigue: Secondary | ICD-10-CM | POA: Insufficient documentation

## 2021-07-24 DIAGNOSIS — Z992 Dependence on renal dialysis: Secondary | ICD-10-CM | POA: Insufficient documentation

## 2021-07-24 DIAGNOSIS — I700 Atherosclerosis of aorta: Secondary | ICD-10-CM | POA: Diagnosis not present

## 2021-07-24 DIAGNOSIS — C3491 Malignant neoplasm of unspecified part of right bronchus or lung: Secondary | ICD-10-CM

## 2021-07-24 NOTE — Progress Notes (Signed)
Kimberly Frost is here today for follow up post radiation to the lung.  Lung Side: Right, patient completed treatment on 03/10/21  Does the patient complain of any of the following: Pain:No Shortness of breath w/wo exertion: Yesm, mostly when she leaves dialysis  Cough: No Hemoptysis: No Pain with swallowing: No Swallowing/choking concerns: No Appetite: good Energy Level: Continues to have low energy on dialysis days.  Post radiation skin Changes: No    Additional comments if applicable:   BP (!) 76/72   Pulse 83   Temp 98.5 F (36.9 C)   Resp 19   Wt 133 lb 9.6 oz (60.6 kg)   SpO2 95%   BMI 21.56 kg/m

## 2021-07-28 ENCOUNTER — Ambulatory Visit
Admission: RE | Admit: 2021-07-28 | Discharge: 2021-07-28 | Disposition: A | Discharge status: Home / Self Care | Payer: Medicare Other | Source: Ambulatory Visit | Attending: Radiation Oncology | Admitting: Radiation Oncology

## 2021-07-28 ENCOUNTER — Other Ambulatory Visit: Payer: Self-pay

## 2021-07-28 DIAGNOSIS — C3411 Malignant neoplasm of upper lobe, right bronchus or lung: Secondary | ICD-10-CM | POA: Insufficient documentation

## 2021-07-28 DIAGNOSIS — C3491 Malignant neoplasm of unspecified part of right bronchus or lung: Secondary | ICD-10-CM

## 2021-07-29 ENCOUNTER — Other Ambulatory Visit: Payer: Self-pay | Admitting: Family

## 2021-07-30 ENCOUNTER — Encounter: Payer: Self-pay | Admitting: Family

## 2021-07-30 ENCOUNTER — Ambulatory Visit (INDEPENDENT_AMBULATORY_CARE_PROVIDER_SITE_OTHER): Payer: Medicare Other | Admitting: Family

## 2021-07-30 VITALS — BP 118/80 | HR 83 | Temp 98.3°F | Resp 16 | Ht 66.0 in | Wt 134.1 lb

## 2021-07-30 DIAGNOSIS — N184 Chronic kidney disease, stage 4 (severe): Secondary | ICD-10-CM | POA: Diagnosis not present

## 2021-07-30 DIAGNOSIS — I10 Essential (primary) hypertension: Secondary | ICD-10-CM | POA: Insufficient documentation

## 2021-07-30 DIAGNOSIS — Z1231 Encounter for screening mammogram for malignant neoplasm of breast: Secondary | ICD-10-CM | POA: Insufficient documentation

## 2021-07-30 DIAGNOSIS — Z79899 Other long term (current) drug therapy: Secondary | ICD-10-CM | POA: Insufficient documentation

## 2021-07-30 DIAGNOSIS — C3491 Malignant neoplasm of unspecified part of right bronchus or lung: Secondary | ICD-10-CM

## 2021-07-30 DIAGNOSIS — B182 Chronic viral hepatitis C: Secondary | ICD-10-CM

## 2021-07-30 DIAGNOSIS — E7800 Pure hypercholesterolemia, unspecified: Secondary | ICD-10-CM | POA: Insufficient documentation

## 2021-07-30 DIAGNOSIS — Z992 Dependence on renal dialysis: Secondary | ICD-10-CM

## 2021-07-30 DIAGNOSIS — M810 Age-related osteoporosis without current pathological fracture: Secondary | ICD-10-CM | POA: Insufficient documentation

## 2021-07-30 DIAGNOSIS — C3411 Malignant neoplasm of upper lobe, right bronchus or lung: Secondary | ICD-10-CM | POA: Diagnosis not present

## 2021-07-30 DIAGNOSIS — I770 Arteriovenous fistula, acquired: Secondary | ICD-10-CM

## 2021-07-30 DIAGNOSIS — N186 End stage renal disease: Secondary | ICD-10-CM

## 2021-07-30 DIAGNOSIS — E782 Mixed hyperlipidemia: Secondary | ICD-10-CM

## 2021-07-30 DIAGNOSIS — I959 Hypotension, unspecified: Secondary | ICD-10-CM

## 2021-07-30 NOTE — Assessment & Plan Note (Signed)
Tried to find workup in the past for this, pt states curative.

## 2021-07-30 NOTE — Assessment & Plan Note (Signed)
Cont f/u with radiation oncology as well as oncologist

## 2021-07-30 NOTE — Assessment & Plan Note (Signed)
Ordered lipid panel, pending results. Work on low cholesterol diet and exercise as tolerated ? ?

## 2021-07-30 NOTE — Assessment & Plan Note (Signed)
F/u with nephrology as scheduled and dialysis as scheduled

## 2021-07-30 NOTE — Assessment & Plan Note (Signed)
Bone density ordered pending results.  Do not get prior to 7/21

## 2021-07-30 NOTE — Assessment & Plan Note (Signed)
Assessed still with bruit F/u with vascular prn

## 2021-07-30 NOTE — Assessment & Plan Note (Signed)
Continue midodrine Monitor perioidically

## 2021-07-30 NOTE — Assessment & Plan Note (Signed)
Mammogram ordered. Pending results. 

## 2021-07-30 NOTE — Assessment & Plan Note (Signed)
Order hepatic function panel pending results

## 2021-07-30 NOTE — Patient Instructions (Addendum)
Call [facility] [facility] to schedule your mammogram and bone density, but don't order until after July 21st, 2023.  as I have sent the electronic order to their facility.  Here is their number : [phone]   Welcome to our clinic, I am happy to have you as my new patient. I am excited to continue on this healthcare journey with you.   I have created an order for lab work today during our visit.  Please schedule an appointment on your way out to return to the lab at your convenience. Please return fasting at your lab appointment (meaning you can only drink black coffee and or water prior to your appointment). I will reach out to you in regards to the labs when I receive the results.     Please keep in mind Any my chart messages you send have up to a three business day turnaround for a response.  Phone calls may take up to a one full business day turnaround for a  response.   If you need a medication refill I recommend you request it through the pharmacy as this is easiest for [place] rather than sending a message and or phone call.   Due to recent changes in healthcare laws, you may see results of your imaging and/or laboratory studies on MyChart before I have had a chance to review them.  I understand that in some cases there may be results that are confusing or concerning to you. Please understand that not all results are received at the same time and often I may need to interpret multiple results in order to provide you with the best plan of care or course of treatment. Therefore, I ask that you please give me 2 business days to thoroughly review all your results before contacting my office for clarification. Should we see a critical lab result, you will be contacted sooner.   It was a pleasure seeing you today! Please do not hesitate to reach out with any questions and or concerns.  Regards,   Eugenia Pancoast FNP-C

## 2021-07-30 NOTE — Progress Notes (Signed)
Established Patient Office Visit  Subjective:  Patient ID: Kimberly Frost, female    DOB: 1950/09/09  Age: 71 y.o. MRN: [id number]  CC:  Chief Complaint  Patient presents with   Establish Care    HPI Kimberly Frost is here for a transition of care visit.  Prior provider was: Webb Silversmith, FNP  Pt is without acute concerns.  Cologuard 10/21 negative  Mammogram: 2021  chronic concerns:  NSCLC right lung of lower lobe,  Most recent Ct scan interval decrease in size of central right upper lobe mass. A new adjacent crescent nad bandlike heterogenous opacity in are about this nodule consist with evolving radiation pneumonitis and fibrosis. A new superior peripheral subpleural mass like opacity of right apex and concerning. Has readiation scheduled for July 11th to restart with Dr. Gery Pray, radiation oncologist.  ESRD: seeing Dr. Delana Meyer as well for dialysis access  Was with GI with Dr. Jonathon Bellows for colonoscopy but had to cancel appt due to new lung cancer diagnosis.   Dr. Elmarie Shiley is nephrologist, Sees him for ESRD. Was on kidney transplant list but had to come off due to lung cancer diagnosis. May be able to get back on in about a year. Sees him about two weeks or so. Goes on Tuesday, Thursday and Saturday for dialysis.   high phoshorus so take sdaily velphoro   Iron infusion when needed.   Past Medical History:  Diagnosis Date   Chronic kidney disease    Edema    RLE   Hepatitis    Hep C   History of blood transfusion    History of radiation therapy    right lung 02/24/2021-03/10/2021  Dr Gery Pray   Hypertension    Lung cancer [facility])    Wears dentures     Past Surgical History:  Procedure Laterality Date   A/V FISTULAGRAM Left 04/11/2019   Procedure: A/V FISTULAGRAM;  Surgeon: Katha Cabal, MD;  Location: [place] [facility];  Service: Cardiovascular;  Laterality: Left;   A/V FISTULAGRAM Left 05/01/2019   Procedure: A/V FISTULAGRAM;  Surgeon: Algernon Huxley, MD;  Location: [place] [facility];  Service: Cardiovascular;  Laterality: Left;   A/V FISTULAGRAM Left 01/15/2020   Procedure: A/V FISTULAGRAM;  Surgeon: Algernon Huxley, MD;  Location: [place] [facility];  Service: Cardiovascular;  Laterality: Left;   A/V FISTULAGRAM Left 04/22/2020   Procedure: A/V FISTULAGRAM;  Surgeon: Algernon Huxley, MD;  Location: [place] [facility];  Service: Cardiovascular;  Laterality: Left;   A/V FISTULAGRAM Left 06/25/2021   Procedure: A/V Fistulagram;  Surgeon: Katha Cabal, MD;  Location: [place] [facility];  Service: Cardiovascular;  Laterality: Left;   AV FISTULA PLACEMENT Left 06/16/2018   Procedure: Left Arm ARTERIOVENOUS (AV) FISTULA CREATION;  Surgeon: Marty Heck, MD;  Location: [facility];  Service: Vascular;  Laterality: Left;   MULTIPLE TOOTH EXTRACTIONS     MYOMECTOMY      Family History  Problem Relation Age of Onset   Diabetes Mother    Hypertension Mother    Breast cancer Mother 79   Bone cancer Father    Stroke Brother    Diabetes Brother    Drug abuse Brother    Breast cancer Maternal Aunt 50   Breast cancer Maternal Aunt 70   Kidney cancer Maternal Grandmother     Social History   Socioeconomic History   Marital status: Significant Other    Spouse name: Not  on file   Number of children: 0   Years of education: Not on file   Highest education level: Not on file  Occupational History   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   Tobacco Use   Smoking status: Former    Types: Cigarettes    Quit date: 1990    Years since quitting: 33.5   Smokeless tobacco: Never  Vaping Use   Vaping Use: Never used  Substance and Sexual Activity   Alcohol use: Not Currently    Alcohol/week: 6.0 standard drinks of alcohol    Types: 6 Cans of beer per week    Comment: quit 3-4 years ago   Drug use: Not Currently    Types: Marijuana    Comment: Years ago   Sexual activity: Not Currently  Other Topics Concern   Not on file  Social  History Narrative   Lives at home in private residence with fiance' Joe    Social Determinants of Health   Financial Resource Strain: Not on file  Food Insecurity: Not on file  Transportation Needs: Not on file  Physical Activity: Not on file  Stress: Not on file  Social Connections: Not on file  Intimate Partner Violence: Not on file    Outpatient Medications Prior to Visit  Medication Sig Dispense Refill   acetaminophen (TYLENOL) 500 MG tablet Take 1,000 mg by mouth every 6 (six) hours as needed for moderate pain or headache.     atorvastatin (LIPITOR) 20 MG tablet TAKE 1 TABLET BY MOUTH EVERY DAY 90 tablet 0   gabapentin (NEURONTIN) 100 MG capsule Take 100 mg by mouth at bedtime.     iron sucrose in sodium chloride 0.9 % 100 mL Iron Sucrose (Venofer)     midodrine (PROAMATINE) 10 MG tablet Take by mouth.     multivitamin (RENA-VIT) TABS tablet Take 1 tablet by mouth daily.     VELPHORO 500 MG chewable tablet Chew 1,000 mg by mouth See admin instructions. Take 1000 mg with meals and 500 mg with snacks     Sodium Sulfate-Mag Sulfate-KCl (SUTAB) [id number] MG TABS At 5 PM take 12 tablets using the 8 oz cup provided in the kit drinking 5 cups of water and 5 hours before your procedure repeat the same process. 24 tablet 0   furosemide (LASIX) 40 MG tablet Take 40 mg by mouth 2 (two) times daily. (Patient not taking: Reported on 07/30/2021)     No facility-administered medications prior to visit.    No Known Allergies  ROS Review of Systems      Objective:    Physical Exam Vitals reviewed.  Constitutional:      General: She is not in acute distress.    Appearance: Normal appearance. She is normal weight. She is not ill-appearing or toxic-appearing.  HENT:     Mouth/Throat:     Pharynx: No pharyngeal swelling.     Tonsils: No tonsillar exudate.  Neck:     Thyroid: No thyroid mass.  Cardiovascular:     Rate and Rhythm: Normal rate and regular rhythm.     Pulses:           Dorsalis pedis pulses are 2+ on the right side and 2+ on the left side.       Posterior tibial pulses are 2+ on the right side and 2+ on the left side.     Arteriovenous access: Left arteriovenous access is present.    Comments: Thrill bruit present on fistula left upper arm  Pulmonary:     Effort: Pulmonary effort is normal.     Breath sounds: Normal breath sounds.  Abdominal:     General: Abdomen is flat. Bowel sounds are normal.     Palpations: Abdomen is soft.  Musculoskeletal:        General: Normal range of motion.     Right lower leg: No edema.     Left lower leg: No edema.  Lymphadenopathy:     Cervical:     Right cervical: No superficial cervical adenopathy.    Left cervical: No superficial cervical adenopathy.  Skin:    General: Skin is warm.     Capillary Refill: Capillary refill takes less than 2 seconds.  Neurological:     General: No focal deficit present.     Mental Status: She is alert and oriented to person, place, and time.  Psychiatric:        Mood and Affect: Mood normal.        Behavior: Behavior normal.        Thought Content: Thought content normal.        Judgment: Judgment normal.       BP 118/80   Pulse 83   Temp 98.3 F (36.8 C)   Resp 16   Ht 5' 6"  (1.676 m)   Wt 134 lb 2 oz (60.8 kg)   SpO2 98%   BMI 21.65 kg/m  Wt Readings from Last 3 Encounters:  07/30/21 134 lb 2 oz (60.8 kg)  07/24/21 133 lb 9.6 oz (60.6 kg)  07/14/21 136 lb 9.6 oz (62 kg)     Health Maintenance Due  Topic Date Due   TETANUS/TDAP  Never done   Zoster Vaccines- Shingrix (1 of 2) Never done   Pneumonia Vaccine 25+ Years old (2 - PPSV23 if available, else PCV20) 09/25/2019   COVID-19 Vaccine (4 - Booster for Moderna series) 01/16/2020    There are no preventive care reminders to display for this patient.  Lab Results  Component Value Date   TSH 3.50 02/25/2018   Lab Results  Component Value Date   WBC 8.3 07/31/2019   HGB 10.3 (L) 07/31/2019   HCT  30.9 (L) 07/31/2019   MCV 87.7 07/31/2019   PLT 247.0 07/31/2019   Lab Results  Component Value Date   NA 137 11/01/2019   K 4.3 11/01/2019   CO2 33 (H) 11/01/2019   GLUCOSE 158 (H) 11/01/2019   BUN 27 (H) 11/01/2019   CREATININE 8.20 (HH) 11/01/2019   BILITOT 0.5 11/01/2019   ALKPHOS 77 11/01/2019   AST 22 11/01/2019   ALT 18 11/01/2019   PROT 8.0 11/01/2019   ALBUMIN 4.2 11/01/2019   CALCIUM 9.9 11/01/2019   ANIONGAP 11 02/27/2018   GFR 5.87 (LL) 11/01/2019   Lab Results  Component Value Date   CHOL 223 (H) 11/01/2019   Lab Results  Component Value Date   HDL 50.10 11/01/2019   Lab Results  Component Value Date   LDLCALC 154 (H) 11/01/2019   Lab Results  Component Value Date   TRIG 94.0 11/01/2019   Lab Results  Component Value Date   CHOLHDL 4 11/01/2019   Lab Results  Component Value Date   HGBA1C 5.5 11/27/2019      Assessment & Plan:   Problem List Items Addressed This Visit       Cardiovascular and Mediastinum   A-V fistula ([place])    Assessed still with bruit F/u with vascular prn  Hypotension    Continue midodrine Monitor perioidically      RESOLVED: Essential hypertension   Relevant Orders   CBC   Hemoglobin A1c     Respiratory   NSCLC of right lung ([place])    Cont f/u with radiation oncology as well as oncologist        Digestive   Hepatitis C    Tried to find workup in the past for this, pt states curative.         Musculoskeletal and Integument   Osteoporosis    Bone density ordered pending results.  Do not get prior to 7/21       Relevant Orders   DG Bone Density     Genitourinary   ESRD (end stage renal disease) on dialysis [facility])    F/u with nephrology as scheduled and dialysis as scheduled      Chronic kidney disease (CKD), active medical management without dialysis, stage 4 (severe) ([place])     Other   Hyperlipemia    Ordered lipid panel, pending results. Work on low cholesterol diet and exercise as  tolerated       On statin therapy    Order hepatic function panel pending results      Relevant Orders   Lipid panel   Hepatic Function Panel   Encounter for screening mammogram for malignant neoplasm of breast - Primary    Mammogram ordered. Pending results.       Relevant Orders   MM 3D SCREEN BREAST BILATERAL   RESOLVED: Hypercholesterolemia   Relevant Orders   Lipid panel    No orders of the defined types were placed in this encounter.   Follow-up: Return in about 6 months (around 01/29/2022) for regular six month follow up .    Eugenia Pancoast, FNP

## 2021-08-04 ENCOUNTER — Other Ambulatory Visit: Payer: Self-pay | Admitting: Family

## 2021-08-04 ENCOUNTER — Other Ambulatory Visit (INDEPENDENT_AMBULATORY_CARE_PROVIDER_SITE_OTHER): Payer: Medicare Other

## 2021-08-04 DIAGNOSIS — Z79899 Other long term (current) drug therapy: Secondary | ICD-10-CM | POA: Diagnosis not present

## 2021-08-04 DIAGNOSIS — I10 Essential (primary) hypertension: Secondary | ICD-10-CM

## 2021-08-04 DIAGNOSIS — E7800 Pure hypercholesterolemia, unspecified: Secondary | ICD-10-CM | POA: Diagnosis not present

## 2021-08-04 LAB — CBC
HCT: 32.7 % — ABNORMAL LOW (ref 36.0–46.0)
Hemoglobin: 11 g/dL — ABNORMAL LOW (ref 12.0–15.0)
MCHC: 33.6 g/dL (ref 30.0–36.0)
MCV: 89 fl (ref 78.0–100.0)
Platelets: 208 10*3/uL (ref 150.0–400.0)
RBC: 3.68 Mil/uL — ABNORMAL LOW (ref 3.87–5.11)
RDW: 15.5 % (ref 11.5–15.5)
WBC: 4.8 10*3/uL (ref 4.0–10.5)

## 2021-08-04 LAB — LIPID PANEL
Cholesterol: 207 mg/dL — ABNORMAL HIGH (ref 0–200)
HDL: 49.2 mg/dL (ref >39.00)
LDL Cholesterol: 141 mg/dL — ABNORMAL HIGH (ref 0–99)
NonHDL: 158.24
Total CHOL/HDL Ratio: 4
Triglycerides: 87 mg/dL (ref 0.0–149.0)
VLDL: 17.4 mg/dL (ref 0.0–40.0)

## 2021-08-04 LAB — HEPATIC FUNCTION PANEL
ALT: 18 U/L (ref 0–35)
AST: 24 U/L (ref 0–37)
Albumin: 4 g/dL (ref 3.5–5.2)
Alkaline Phosphatase: 61 U/L (ref 39–117)
Bilirubin, Direct: 0.2 mg/dL (ref 0.0–0.3)
Total Bilirubin: 0.6 mg/dL (ref 0.2–1.2)
Total Protein: 7 g/dL (ref 6.0–8.3)

## 2021-08-04 LAB — HEMOGLOBIN A1C: Hgb A1c MFr Bld: 5.2 % (ref 4.6–6.5)

## 2021-08-04 MED ORDER — ATORVASTATIN CALCIUM 40 MG PO TABS: 40.0000 mg | ORAL_TABLET | Freq: Every day | ORAL | 3 refills | Status: DC

## 2021-08-04 NOTE — Progress Notes (Signed)
Anemia with some improvement.  Cholesterol still a bit elevated, is she taking lipitor 20 mg daily and tolerating it well? I would like to increase her to 40 mg lipitor daily. Goal ldl <70 pt is in the 140s Other labs unremarkable

## 2021-08-12 ENCOUNTER — Ambulatory Visit
Admission: RE | Admit: 2021-08-12 | Discharge: 2021-08-12 | Disposition: A | Discharge status: Home / Self Care | Payer: Medicare Other | Source: Ambulatory Visit | Attending: Radiation Oncology | Admitting: Radiation Oncology

## 2021-08-12 ENCOUNTER — Other Ambulatory Visit: Payer: Self-pay

## 2021-08-12 ENCOUNTER — Ambulatory Visit: Payer: Medicare Other

## 2021-08-12 DIAGNOSIS — C3411 Malignant neoplasm of upper lobe, right bronchus or lung: Secondary | ICD-10-CM | POA: Insufficient documentation

## 2021-08-12 LAB — RAD ONC ARIA SESSION SUMMARY
Course Elapsed Days: 0
Plan Fractions Treated to Date: 1
Plan Prescribed Dose Per Fraction: 5 Gy
Plan Total Fractions Prescribed: 10
Plan Total Prescribed Dose: 50 Gy
Reference Point Dosage Given to Date: 5 Gy
Reference Point Session Dosage Given: 5 Gy
Session Number: 1

## 2021-08-13 ENCOUNTER — Other Ambulatory Visit: Payer: Self-pay

## 2021-08-13 ENCOUNTER — Ambulatory Visit
Admission: RE | Admit: 2021-08-13 | Discharge: 2021-08-13 | Disposition: A | Discharge status: Home / Self Care | Payer: Medicare Other | Source: Ambulatory Visit | Attending: Radiation Oncology | Admitting: Radiation Oncology

## 2021-08-13 DIAGNOSIS — C3411 Malignant neoplasm of upper lobe, right bronchus or lung: Secondary | ICD-10-CM | POA: Diagnosis not present

## 2021-08-13 LAB — RAD ONC ARIA SESSION SUMMARY
Course Elapsed Days: 1
Plan Fractions Treated to Date: 2
Plan Prescribed Dose Per Fraction: 5 Gy
Plan Total Fractions Prescribed: 10
Plan Total Prescribed Dose: 50 Gy
Reference Point Dosage Given to Date: 10 Gy
Reference Point Session Dosage Given: 5 Gy
Session Number: 2

## 2021-08-14 ENCOUNTER — Ambulatory Visit
Admission: RE | Admit: 2021-08-14 | Discharge: 2021-08-14 | Disposition: A | Discharge status: Home / Self Care | Payer: Medicare Other | Source: Ambulatory Visit | Attending: Radiation Oncology | Admitting: Radiation Oncology

## 2021-08-14 ENCOUNTER — Other Ambulatory Visit: Payer: Self-pay

## 2021-08-14 DIAGNOSIS — C3411 Malignant neoplasm of upper lobe, right bronchus or lung: Secondary | ICD-10-CM | POA: Diagnosis not present

## 2021-08-14 LAB — RAD ONC ARIA SESSION SUMMARY
Course Elapsed Days: 2
Plan Fractions Treated to Date: 3
Plan Prescribed Dose Per Fraction: 5 Gy
Plan Total Fractions Prescribed: 10
Plan Total Prescribed Dose: 50 Gy
Reference Point Dosage Given to Date: 15 Gy
Reference Point Session Dosage Given: 5 Gy
Session Number: 3

## 2021-08-15 ENCOUNTER — Other Ambulatory Visit: Payer: Self-pay

## 2021-08-15 ENCOUNTER — Ambulatory Visit
Admission: RE | Admit: 2021-08-15 | Discharge: 2021-08-15 | Disposition: A | Discharge status: Home / Self Care | Payer: Medicare Other | Source: Ambulatory Visit | Attending: Radiation Oncology | Admitting: Radiation Oncology

## 2021-08-15 DIAGNOSIS — C3411 Malignant neoplasm of upper lobe, right bronchus or lung: Secondary | ICD-10-CM | POA: Diagnosis not present

## 2021-08-15 LAB — RAD ONC ARIA SESSION SUMMARY
Course Elapsed Days: 3
Plan Fractions Treated to Date: 4
Plan Prescribed Dose Per Fraction: 5 Gy
Plan Total Fractions Prescribed: 10
Plan Total Prescribed Dose: 50 Gy
Reference Point Dosage Given to Date: 20 Gy
Reference Point Session Dosage Given: 5 Gy
Session Number: 4

## 2021-08-18 ENCOUNTER — Other Ambulatory Visit: Payer: Self-pay

## 2021-08-18 ENCOUNTER — Ambulatory Visit
Admission: RE | Admit: 2021-08-18 | Discharge: 2021-08-18 | Disposition: A | Discharge status: Home / Self Care | Payer: Medicare Other | Source: Ambulatory Visit | Attending: Radiation Oncology | Admitting: Radiation Oncology

## 2021-08-18 DIAGNOSIS — C3411 Malignant neoplasm of upper lobe, right bronchus or lung: Secondary | ICD-10-CM | POA: Diagnosis not present

## 2021-08-18 LAB — RAD ONC ARIA SESSION SUMMARY
Course Elapsed Days: 6
Plan Fractions Treated to Date: 5
Plan Prescribed Dose Per Fraction: 5 Gy
Plan Total Fractions Prescribed: 10
Plan Total Prescribed Dose: 50 Gy
Reference Point Dosage Given to Date: 25 Gy
Reference Point Session Dosage Given: 5 Gy
Session Number: 5

## 2021-08-19 ENCOUNTER — Ambulatory Visit
Admission: RE | Admit: 2021-08-19 | Discharge: 2021-08-19 | Disposition: A | Discharge status: Home / Self Care | Payer: Medicare Other | Source: Ambulatory Visit | Attending: Radiation Oncology | Admitting: Radiation Oncology

## 2021-08-19 ENCOUNTER — Other Ambulatory Visit: Payer: Self-pay

## 2021-08-19 DIAGNOSIS — C3411 Malignant neoplasm of upper lobe, right bronchus or lung: Secondary | ICD-10-CM | POA: Diagnosis not present

## 2021-08-19 LAB — RAD ONC ARIA SESSION SUMMARY
Course Elapsed Days: 7
Plan Fractions Treated to Date: 6
Plan Prescribed Dose Per Fraction: 5 Gy
Plan Total Fractions Prescribed: 10
Plan Total Prescribed Dose: 50 Gy
Reference Point Dosage Given to Date: 30 Gy
Reference Point Session Dosage Given: 5 Gy
Session Number: 6

## 2021-08-20 ENCOUNTER — Other Ambulatory Visit: Payer: Self-pay

## 2021-08-20 ENCOUNTER — Ambulatory Visit
Admission: RE | Admit: 2021-08-20 | Discharge: 2021-08-20 | Disposition: A | Discharge status: Home / Self Care | Payer: Medicare Other | Source: Ambulatory Visit | Attending: Radiation Oncology | Admitting: Radiation Oncology

## 2021-08-20 DIAGNOSIS — C3411 Malignant neoplasm of upper lobe, right bronchus or lung: Secondary | ICD-10-CM | POA: Diagnosis not present

## 2021-08-20 LAB — RAD ONC ARIA SESSION SUMMARY
Course Elapsed Days: 8
Plan Fractions Treated to Date: 7
Plan Prescribed Dose Per Fraction: 5 Gy
Plan Total Fractions Prescribed: 10
Plan Total Prescribed Dose: 50 Gy
Reference Point Dosage Given to Date: 35 Gy
Reference Point Session Dosage Given: 5 Gy
Session Number: 7

## 2021-08-21 ENCOUNTER — Other Ambulatory Visit: Payer: Self-pay

## 2021-08-21 ENCOUNTER — Ambulatory Visit
Admission: RE | Admit: 2021-08-21 | Discharge: 2021-08-21 | Disposition: A | Discharge status: Home / Self Care | Payer: Medicare Other | Source: Ambulatory Visit | Attending: Radiation Oncology | Admitting: Radiation Oncology

## 2021-08-21 DIAGNOSIS — C3411 Malignant neoplasm of upper lobe, right bronchus or lung: Secondary | ICD-10-CM | POA: Diagnosis not present

## 2021-08-21 LAB — RAD ONC ARIA SESSION SUMMARY
Course Elapsed Days: 9
Plan Fractions Treated to Date: 8
Plan Prescribed Dose Per Fraction: 5 Gy
Plan Total Fractions Prescribed: 10
Plan Total Prescribed Dose: 50 Gy
Reference Point Dosage Given to Date: 40 Gy
Reference Point Session Dosage Given: 5 Gy
Session Number: 8

## 2021-08-22 ENCOUNTER — Other Ambulatory Visit: Payer: Self-pay

## 2021-08-22 ENCOUNTER — Ambulatory Visit
Admission: RE | Admit: 2021-08-22 | Discharge: 2021-08-22 | Disposition: A | Discharge status: Home / Self Care | Payer: Medicare Other | Source: Ambulatory Visit | Attending: Radiation Oncology | Admitting: Radiation Oncology

## 2021-08-22 DIAGNOSIS — C3411 Malignant neoplasm of upper lobe, right bronchus or lung: Secondary | ICD-10-CM | POA: Diagnosis not present

## 2021-08-22 LAB — RAD ONC ARIA SESSION SUMMARY
Course Elapsed Days: 10
Plan Fractions Treated to Date: 9
Plan Prescribed Dose Per Fraction: 5 Gy
Plan Total Fractions Prescribed: 10
Plan Total Prescribed Dose: 50 Gy
Reference Point Dosage Given to Date: 45 Gy
Reference Point Session Dosage Given: 5 Gy
Session Number: 9

## 2021-08-25 ENCOUNTER — Other Ambulatory Visit: Payer: Self-pay

## 2021-08-25 ENCOUNTER — Ambulatory Visit
Admission: RE | Admit: 2021-08-25 | Discharge: 2021-08-25 | Disposition: A | Discharge status: Home / Self Care | Payer: Medicare Other | Source: Ambulatory Visit | Attending: Radiation Oncology | Admitting: Radiation Oncology

## 2021-08-25 DIAGNOSIS — C3411 Malignant neoplasm of upper lobe, right bronchus or lung: Secondary | ICD-10-CM | POA: Diagnosis not present

## 2021-08-25 LAB — RAD ONC ARIA SESSION SUMMARY
Course Elapsed Days: 13
Plan Fractions Treated to Date: 10
Plan Prescribed Dose Per Fraction: 5 Gy
Plan Total Fractions Prescribed: 10
Plan Total Prescribed Dose: 50 Gy
Reference Point Dosage Given to Date: 50 Gy
Reference Point Session Dosage Given: 5 Gy
Session Number: 10

## 2021-09-08 ENCOUNTER — Encounter (INDEPENDENT_AMBULATORY_CARE_PROVIDER_SITE_OTHER): Payer: Self-pay | Admitting: Nurse Practitioner

## 2021-09-08 ENCOUNTER — Ambulatory Visit (INDEPENDENT_AMBULATORY_CARE_PROVIDER_SITE_OTHER): Payer: Medicare Other

## 2021-09-08 ENCOUNTER — Ambulatory Visit (INDEPENDENT_AMBULATORY_CARE_PROVIDER_SITE_OTHER): Payer: Medicare Other | Admitting: Nurse Practitioner

## 2021-09-08 VITALS — BP 108/59 | HR 83 | Resp 16 | Wt 135.8 lb

## 2021-09-08 DIAGNOSIS — N186 End stage renal disease: Secondary | ICD-10-CM

## 2021-09-08 DIAGNOSIS — Z9862 Peripheral vascular angioplasty status: Secondary | ICD-10-CM | POA: Diagnosis not present

## 2021-09-08 DIAGNOSIS — I10 Essential (primary) hypertension: Secondary | ICD-10-CM

## 2021-09-08 DIAGNOSIS — E782 Mixed hyperlipidemia: Secondary | ICD-10-CM | POA: Diagnosis not present

## 2021-09-08 NOTE — Progress Notes (Signed)
Subjective:    Patient ID: Kimberly Frost, female    DOB: 1950-12-31, 71 y.o.   MRN: [id number] Chief Complaint  Patient presents with   Follow-up    Ultrasound follow up    The patient returns to the office for followup status post intervention of their dialysis access 06/25/2021.   Following the intervention the access function has significantly improved, with better flow rates and improved KT/V. The patient has not been experiencing increased bleeding times following decannulation and the patient denies increased recirculation. The patient denies an increase in arm swelling. At the present time the patient denies hand pain.  No recent shortening of the patient's walking distance or new symptoms consistent with claudication.  No history of rest pain symptoms. No new ulcers or wounds of the lower extremities have occurred.  The patient denies amaurosis fugax or recent TIA symptoms. There are no recent neurological changes noted. There is no history of DVT, PE or superficial thrombophlebitis. No recent episodes of angina or shortness of breath documented.   Duplex ultrasound of the AV access shows a patent access.  The previously noted stenosis is improved compared to last study.  Flow volume today is 1942 cc/min (previous flow volume was 905 cc/min)        Review of Systems  All other systems reviewed and are negative.      Objective:   Physical Exam Vitals reviewed.  HENT:     Head: Normocephalic.  Cardiovascular:     Rate and Rhythm: Normal rate.     Pulses:          Radial pulses are 1+ on the right side and 1+ on the left side.     Arteriovenous access: Left arteriovenous access is present.    Comments: Good thrill and bruit  Pulmonary:     Effort: Pulmonary effort is normal.  Skin:    General: Skin is warm and dry.  Neurological:     Mental Status: She is alert and oriented to person, place, and time.  Psychiatric:        Mood and Affect: Mood normal.         Behavior: Behavior normal.        Thought Content: Thought content normal.        Judgment: Judgment normal.     BP (!) 108/59 (BP Location: Right Arm)   Pulse 83   Resp 16   Wt 135 lb 12.8 oz (61.6 kg)   BMI 21.92 kg/m   Past Medical History:  Diagnosis Date   Chronic kidney disease    Edema    RLE   Hepatitis    Hep C   History of blood transfusion    History of radiation therapy    right lung 02/24/2021-03/10/2021  Dr Gery Pray   Hypertension    Lung cancer [facility])    Wears dentures     Social History   Socioeconomic History   Marital status: Significant Other    Spouse name: Not on file   Number of children: 0   Years of education: Not on file   Highest education level: Not on file  Occupational History   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   Tobacco Use   Smoking status: Former    Types: Cigarettes    Quit date: 1990    Years since quitting: 33.6   Smokeless tobacco: Never  Vaping Use   Vaping Use: Never used  Substance and Sexual Activity   Alcohol  use: Not Currently    Alcohol/week: 6.0 standard drinks of alcohol    Types: 6 Cans of beer per week    Comment: quit 3-4 years ago   Drug use: Not Currently    Types: Marijuana    Comment: Years ago   Sexual activity: Not Currently  Other Topics Concern   Not on file  Social History Narrative   Lives at home in private residence with fiance' Joe    Social Determinants of Health   Financial Resource Strain: Not on file  Food Insecurity: Not on file  Transportation Needs: Not on file  Physical Activity: Not on file  Stress: Not on file  Social Connections: Not on file  Intimate Partner Violence: Not on file    Past Surgical History:  Procedure Laterality Date   A/V FISTULAGRAM Left 04/11/2019   Procedure: A/V FISTULAGRAM;  Surgeon: Katha Cabal, MD;  Location: [place] [facility];  Service: Cardiovascular;  Laterality: Left;   A/V FISTULAGRAM Left 05/01/2019   Procedure: A/V FISTULAGRAM;   Surgeon: Algernon Huxley, MD;  Location: [place] [facility];  Service: Cardiovascular;  Laterality: Left;   A/V FISTULAGRAM Left 01/15/2020   Procedure: A/V FISTULAGRAM;  Surgeon: Algernon Huxley, MD;  Location: [place] [facility];  Service: Cardiovascular;  Laterality: Left;   A/V FISTULAGRAM Left 04/22/2020   Procedure: A/V FISTULAGRAM;  Surgeon: Algernon Huxley, MD;  Location: [place] [facility];  Service: Cardiovascular;  Laterality: Left;   A/V FISTULAGRAM Left 06/25/2021   Procedure: A/V Fistulagram;  Surgeon: Katha Cabal, MD;  Location: [place] [facility];  Service: Cardiovascular;  Laterality: Left;   AV FISTULA PLACEMENT Left 06/16/2018   Procedure: Left Arm ARTERIOVENOUS (AV) FISTULA CREATION;  Surgeon: Marty Heck, MD;  Location: [facility];  Service: Vascular;  Laterality: Left;   MULTIPLE TOOTH EXTRACTIONS     MYOMECTOMY      Family History  Problem Relation Age of Onset   Diabetes Mother    Hypertension Mother    Breast cancer Mother 55   Bone cancer Father    Stroke Brother    Diabetes Brother    Drug abuse Brother    Breast cancer Maternal Aunt 38   Breast cancer Maternal Aunt 30   Kidney cancer Maternal Grandmother     No Known Allergies     Latest Ref Rng & Units 08/04/2021    8:08 AM 07/31/2019   11:25 AM 08/18/2018    1:44 PM  CBC  WBC 4.0 - 10.5 K/uL 4.8  8.3    Hemoglobin 12.0 - 15.0 g/dL 11.0  10.3  12.0   Hematocrit 36.0 - 46.0 % 32.7  30.9    Platelets 150.0 - 400.0 K/uL 208.0  247.0        CMP     Component Value Date/Time   NA 137 11/01/2019 0856   NA 138 06/23/2018 0000   K 4.3 11/01/2019 0856   K 4.9 06/23/2018 0000   CL 91 (L) 11/01/2019 0856   CO2 33 (H) 11/01/2019 0856   GLUCOSE 158 (H) 11/01/2019 0856   BUN 27 (H) 11/01/2019 0856   BUN 61 06/23/2018 0000   CREATININE 8.20 (HH) 11/01/2019 0856   CREATININE 7.13 06/23/2018 0000   CALCIUM 9.9 11/01/2019 0856   CALCIUM 8.8 06/23/2018 0000   PROT 7.0 08/04/2021 0808   ALBUMIN  4.0 08/04/2021 0808   AST 24 08/04/2021 0808   ALT 18 08/04/2021 1941  ALKPHOS 61 08/04/2021 0808   BILITOT 0.6 08/04/2021 0808   GFRNONAA 9 (L) 02/27/2018 0533   GFRAA 6 06/23/2018 0000     No results found.     Assessment & Plan:   1. ESRD (end stage renal disease) ([place]) Recommend:  The patient is doing well and currently has adequate dialysis access. The patient's dialysis center is not reporting any access issues. Flow pattern is stable when compared to the prior ultrasound.  The patient should have a duplex ultrasound of the dialysis access in 6 months. The patient will follow-up with me in the office after each ultrasound     2. Mixed hyperlipidemia Continue statin as ordered and reviewed, no changes at this time   3. Essential hypertension, benign Continue antihypertensive medications as already ordered, these medications have been reviewed and there are no changes at this time.     Current Outpatient Medications on File Prior to Visit  Medication Sig Dispense Refill   acetaminophen (TYLENOL) 500 MG tablet Take 1,000 mg by mouth every 6 (six) hours as needed for moderate pain or headache.     gabapentin (NEURONTIN) 100 MG capsule Take 100 mg by mouth at bedtime.     iron sucrose in sodium chloride 0.9 % 100 mL Iron Sucrose (Venofer)     midodrine (PROAMATINE) 10 MG tablet Take by mouth.     atorvastatin (LIPITOR) 40 MG tablet Take 1 tablet (40 mg total) by mouth daily. (Patient not taking: Reported on 09/08/2021) 90 tablet 3   multivitamin (RENA-VIT) TABS tablet Take 1 tablet by mouth daily. (Patient not taking: Reported on 09/08/2021)     VELPHORO 500 MG chewable tablet Chew 1,000 mg by mouth See admin instructions. Take 1000 mg with meals and 500 mg with snacks (Patient not taking: Reported on 09/08/2021)     No current facility-administered medications on file prior to visit.    There are no Patient Instructions on file for this visit. No follow-ups on  file.   Kris Hartmann, NP

## 2021-10-03 ENCOUNTER — Ambulatory Visit
Admission: RE | Admit: 2021-10-03 | Discharge: 2021-10-03 | Disposition: A | Discharge status: Home / Self Care | Payer: Medicare Other | Source: Ambulatory Visit | Attending: Family | Admitting: Family

## 2021-10-03 DIAGNOSIS — M810 Age-related osteoporosis without current pathological fracture: Secondary | ICD-10-CM | POA: Diagnosis not present

## 2021-10-03 DIAGNOSIS — Z1231 Encounter for screening mammogram for malignant neoplasm of breast: Secondary | ICD-10-CM

## 2021-10-03 DIAGNOSIS — Z780 Asymptomatic menopausal state: Secondary | ICD-10-CM | POA: Diagnosis not present

## 2021-10-04 DIAGNOSIS — D689 Coagulation defect, unspecified: Secondary | ICD-10-CM | POA: Diagnosis not present

## 2021-10-04 DIAGNOSIS — N2581 Secondary hyperparathyroidism of renal origin: Secondary | ICD-10-CM | POA: Diagnosis not present

## 2021-10-04 DIAGNOSIS — D631 Anemia in chronic kidney disease: Secondary | ICD-10-CM | POA: Diagnosis not present

## 2021-10-04 DIAGNOSIS — N186 End stage renal disease: Secondary | ICD-10-CM | POA: Diagnosis not present

## 2021-10-04 DIAGNOSIS — Z992 Dependence on renal dialysis: Secondary | ICD-10-CM | POA: Diagnosis not present

## 2021-10-07 DIAGNOSIS — D689 Coagulation defect, unspecified: Secondary | ICD-10-CM | POA: Diagnosis not present

## 2021-10-07 DIAGNOSIS — Z992 Dependence on renal dialysis: Secondary | ICD-10-CM | POA: Diagnosis not present

## 2021-10-07 DIAGNOSIS — N186 End stage renal disease: Secondary | ICD-10-CM | POA: Diagnosis not present

## 2021-10-07 DIAGNOSIS — N2581 Secondary hyperparathyroidism of renal origin: Secondary | ICD-10-CM | POA: Diagnosis not present

## 2021-10-07 DIAGNOSIS — D631 Anemia in chronic kidney disease: Secondary | ICD-10-CM | POA: Diagnosis not present

## 2021-10-07 NOTE — Progress Notes (Signed)
Pt was found to have osteoporosis in her right hip Has she ever taken fosamax for osteoporosis? Or any other medication for osteoporosis?  I do suggest we start fosamax to help with bones to keep them from getting worse.  If questions have her make appt w me to discuss.

## 2021-10-07 NOTE — Progress Notes (Signed)
Noted. Mammogram with no suspicious findings.

## 2021-10-09 DIAGNOSIS — D689 Coagulation defect, unspecified: Secondary | ICD-10-CM | POA: Diagnosis not present

## 2021-10-09 DIAGNOSIS — N2581 Secondary hyperparathyroidism of renal origin: Secondary | ICD-10-CM | POA: Diagnosis not present

## 2021-10-09 DIAGNOSIS — D631 Anemia in chronic kidney disease: Secondary | ICD-10-CM | POA: Diagnosis not present

## 2021-10-09 DIAGNOSIS — N186 End stage renal disease: Secondary | ICD-10-CM | POA: Diagnosis not present

## 2021-10-09 DIAGNOSIS — Z992 Dependence on renal dialysis: Secondary | ICD-10-CM | POA: Diagnosis not present

## 2021-10-10 ENCOUNTER — Encounter: Payer: Self-pay | Admitting: Family

## 2021-10-10 ENCOUNTER — Ambulatory Visit (INDEPENDENT_AMBULATORY_CARE_PROVIDER_SITE_OTHER): Payer: Medicare Other | Admitting: Family

## 2021-10-10 VITALS — BP 116/60 | HR 94 | Temp 98.2°F | Resp 16 | Ht 66.0 in | Wt 136.1 lb

## 2021-10-10 DIAGNOSIS — M810 Age-related osteoporosis without current pathological fracture: Secondary | ICD-10-CM

## 2021-10-10 DIAGNOSIS — N184 Chronic kidney disease, stage 4 (severe): Secondary | ICD-10-CM | POA: Diagnosis not present

## 2021-10-10 MED ORDER — DENOSUMAB 60 MG/ML ~~LOC~~ SOSY: 60.0000 mg | PREFILLED_SYRINGE | Freq: Once | SUBCUTANEOUS | 0 refills | Status: DC

## 2021-10-10 NOTE — Patient Instructions (Signed)
Going to speak with insurance in regards to starting prolia.    Regards,   Eugenia Pancoast FNP-C

## 2021-10-10 NOTE — Assessment & Plan Note (Addendum)
reviewed most recent bone density scan.   would like to restart Prolia every 6 months if improved by insurance.  Sending referral to request coordinator advised patient that we should hear something back to in regards to starting this injection.  If started patient to receive once every 6 months.  Handout given for osteoporosis.

## 2021-10-10 NOTE — Progress Notes (Signed)
Established Patient Office Visit  Subjective:  Patient ID: Kimberly Frost, female    DOB: 09-24-1950  Age: 71 y.o. MRN: [id number]  CC:  Chief Complaint  Patient presents with   Osteoporosis    HPI Kimberly Frost is here today for follow up.   Pt is with acute concerns.  Osteoporosis: Sees kidney Dr, Dr. Posey Pronto, and he recommended that pt be treated with prolia as she is not candidate for biophosphonates due to ESRD. Has had prolia in the past, but only received one injection however there was an issue with cost and insurance which later resulted in no charge, and so she went without.   Anemia stable.  Increase in potassium three months ago no recent repeat. Once weekly with dialysis they do check her labs, and she states potassium wnl. Per lab note potassium 4.9 last visit on 09/09/21 which is reassuring.  ESRD: sees vascular Dr. Owens Shark for f/u on her dialysis access. Last visit 09/08/21.   Lung cancer: completed radiation, pending pet scan at this point to re-image and see status of cancer, hopefully in remission.   Past Medical History:  Diagnosis Date   Chronic kidney disease    Edema    RLE   Hepatitis    Hep C   History of blood transfusion    History of radiation therapy    right lung 02/24/2021-03/10/2021  Dr Gery Pray   Hypertension    Lung cancer [facility])    Wears dentures     Past Surgical History:  Procedure Laterality Date   A/V FISTULAGRAM Left 04/11/2019   Procedure: A/V FISTULAGRAM;  Surgeon: Katha Cabal, MD;  Location: [place] [facility];  Service: Cardiovascular;  Laterality: Left;   A/V FISTULAGRAM Left 05/01/2019   Procedure: A/V FISTULAGRAM;  Surgeon: Algernon Huxley, MD;  Location: [place] [facility];  Service: Cardiovascular;  Laterality: Left;   A/V FISTULAGRAM Left 01/15/2020   Procedure: A/V FISTULAGRAM;  Surgeon: Algernon Huxley, MD;  Location: [place] [facility];  Service: Cardiovascular;  Laterality: Left;   A/V FISTULAGRAM Left 04/22/2020    Procedure: A/V FISTULAGRAM;  Surgeon: Algernon Huxley, MD;  Location: [place] [facility];  Service: Cardiovascular;  Laterality: Left;   A/V FISTULAGRAM Left 06/25/2021   Procedure: A/V Fistulagram;  Surgeon: Katha Cabal, MD;  Location: [place] [facility];  Service: Cardiovascular;  Laterality: Left;   AV FISTULA PLACEMENT Left 06/16/2018   Procedure: Left Arm ARTERIOVENOUS (AV) FISTULA CREATION;  Surgeon: Marty Heck, MD;  Location: [facility];  Service: Vascular;  Laterality: Left;   MULTIPLE TOOTH EXTRACTIONS     MYOMECTOMY      Family History  Problem Relation Age of Onset   Diabetes Mother    Hypertension Mother    Breast cancer Mother 73   Bone cancer Father    Stroke Brother    Diabetes Brother    Drug abuse Brother    Breast cancer Maternal Aunt 50   Breast cancer Maternal Aunt 70   Kidney cancer Maternal Grandmother     Social History   Socioeconomic History   Marital status: Significant Other    Spouse name: Not on file   Number of children: 0   Years of education: Not on file   Highest education level: Not on file  Occupational History   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   Tobacco Use   Smoking status: Former    Types: Cigarettes    Quit  date: 21    Years since quitting: 33.7   Smokeless tobacco: Never  Vaping Use   Vaping Use: Never used  Substance and Sexual Activity   Alcohol use: Not Currently    Alcohol/week: 6.0 standard drinks of alcohol    Types: 6 Cans of beer per week    Comment: quit 3-4 years ago   Drug use: Not Currently    Types: Marijuana    Comment: Years ago   Sexual activity: Not Currently  Other Topics Concern   Not on file  Social History Narrative   Lives at home in private residence with fiance' Joe    Social Determinants of Health   Financial Resource Strain: Not on file  Food Insecurity: Not on file  Transportation Needs: Not on file  Physical Activity: Not on file  Stress: Not on file  Social Connections: Not  on file  Intimate Partner Violence: Not on file    Outpatient Medications Prior to Visit  Medication Sig Dispense Refill   acetaminophen (TYLENOL) 500 MG tablet Take 1,000 mg by mouth every 6 (six) hours as needed for moderate pain or headache.     atorvastatin (LIPITOR) 40 MG tablet Take 1 tablet (40 mg total) by mouth daily. 90 tablet 3   gabapentin (NEURONTIN) 100 MG capsule Take 100 mg by mouth at bedtime.     iron sucrose in sodium chloride 0.9 % 100 mL Iron Sucrose (Venofer)     midodrine (PROAMATINE) 10 MG tablet Take by mouth.     multivitamin (RENA-VIT) TABS tablet Take 1 tablet by mouth daily.     VELPHORO 500 MG chewable tablet Chew 1,000 mg by mouth See admin instructions. Take 1000 mg with meals and 500 mg with snacks     No facility-administered medications prior to visit.    No Known Allergies     Objective:    Physical Exam Constitutional:      General: She is not in acute distress.    Appearance: Normal appearance. She is normal weight. She is not ill-appearing, toxic-appearing or diaphoretic.  Cardiovascular:     Rate and Rhythm: Normal rate and regular rhythm.  Pulmonary:     Effort: Pulmonary effort is normal.  Neurological:     General: No focal deficit present.     Mental Status: She is alert and oriented to person, place, and time. Mental status is at baseline.  Psychiatric:        Mood and Affect: Mood normal.        Behavior: Behavior normal.        Thought Content: Thought content normal.        Judgment: Judgment normal.       BP 116/60   Pulse 94   Temp 98.2 F (36.8 C)   Resp 16   Ht 5\' 6"  (1.676 m)   Wt 136 lb 2 oz (61.7 kg)   SpO2 99%   BMI 21.97 kg/m  Wt Readings from Last 3 Encounters:  10/10/21 136 lb 2 oz (61.7 kg)  09/08/21 135 lb 12.8 oz (61.6 kg)  07/30/21 134 lb 2 oz (60.8 kg)     Health Maintenance Due  Topic Date Due   TETANUS/TDAP  Never done   Zoster Vaccines- Shingrix (1 of 2) Never done   Pneumonia Vaccine  96+ Years old (2 - PPSV23 or PCV20) 09/25/2019   COVID-19 Vaccine (4 - Moderna risk series) 01/16/2020   INFLUENZA VACCINE  09/02/2021    There are  no preventive care reminders to display for this patient.  Lab Results  Component Value Date   TSH 3.50 02/25/2018   Lab Results  Component Value Date   WBC 4.8 08/04/2021   HGB 11.0 (L) 08/04/2021   HCT 32.7 (L) 08/04/2021   MCV 89.0 08/04/2021   PLT 208.0 08/04/2021   Lab Results  Component Value Date   NA 137 11/01/2019   K 4.3 11/01/2019   CO2 33 (H) 11/01/2019   GLUCOSE 158 (H) 11/01/2019   BUN 27 (H) 11/01/2019   CREATININE 8.20 (HH) 11/01/2019   BILITOT 0.6 08/04/2021   ALKPHOS 61 08/04/2021   AST 24 08/04/2021   ALT 18 08/04/2021   PROT 7.0 08/04/2021   ALBUMIN 4.0 08/04/2021   CALCIUM 9.9 11/01/2019   ANIONGAP 11 02/27/2018   GFR 5.87 (LL) 11/01/2019   Lab Results  Component Value Date   CHOL 207 (H) 08/04/2021   Lab Results  Component Value Date   HDL 49.20 08/04/2021   Lab Results  Component Value Date   LDLCALC 141 (H) 08/04/2021   Lab Results  Component Value Date   TRIG 87.0 08/04/2021   Lab Results  Component Value Date   CHOLHDL 4 08/04/2021   Lab Results  Component Value Date   HGBA1C 5.2 08/04/2021      Assessment & Plan:   Problem List Items Addressed This Visit       Musculoskeletal and Integument   Osteoporosis - Primary    reviewed most recent bone density scan.   would like to restart Prolia every 6 months if improved by insurance.  Sending referral to request coordinator advised patient that we should hear something back to in regards to starting this injection.  If started patient to receive once every 6 months.  Handout given for osteoporosis.        Genitourinary   Chronic kidney disease (CKD), active medical management without dialysis, stage 4 (severe) (HCC)    Meds ordered this encounter  Medications   DISCONTD: denosumab (PROLIA) 60 MG/ML SOSY injection     Sig: Inject 60 mg into the skin once for 1 dose.    Dispense:  1 mL    Refill:  0    Order Specific Question:   Supervising Provider    Answer:   Diona Browner AMY E [[id number]]    Follow-up: Return for keep appt as scheduled in 01/2022 .    Eugenia Pancoast, FNP

## 2021-10-11 DIAGNOSIS — N186 End stage renal disease: Secondary | ICD-10-CM | POA: Diagnosis not present

## 2021-10-11 DIAGNOSIS — Z992 Dependence on renal dialysis: Secondary | ICD-10-CM | POA: Diagnosis not present

## 2021-10-11 DIAGNOSIS — D631 Anemia in chronic kidney disease: Secondary | ICD-10-CM | POA: Diagnosis not present

## 2021-10-11 DIAGNOSIS — D689 Coagulation defect, unspecified: Secondary | ICD-10-CM | POA: Diagnosis not present

## 2021-10-11 DIAGNOSIS — N2581 Secondary hyperparathyroidism of renal origin: Secondary | ICD-10-CM | POA: Diagnosis not present

## 2021-10-13 ENCOUNTER — Telehealth: Payer: Medicare Other

## 2021-10-13 DIAGNOSIS — M810 Age-related osteoporosis without current pathological fracture: Secondary | ICD-10-CM

## 2021-10-13 NOTE — Telephone Encounter (Signed)
Kimberly Pancoast, FNP  Kris Mouton, CMA Hi!   New prolia start can not do biphosphonates due to End state renal disease. Has been on prolia a few years ago x 1 dose. Cost was prohibitive but with medicare now.   Benefits submitted-pending information

## 2021-10-14 DIAGNOSIS — D631 Anemia in chronic kidney disease: Secondary | ICD-10-CM | POA: Diagnosis not present

## 2021-10-14 DIAGNOSIS — D689 Coagulation defect, unspecified: Secondary | ICD-10-CM | POA: Diagnosis not present

## 2021-10-14 DIAGNOSIS — N2581 Secondary hyperparathyroidism of renal origin: Secondary | ICD-10-CM | POA: Diagnosis not present

## 2021-10-14 DIAGNOSIS — Z992 Dependence on renal dialysis: Secondary | ICD-10-CM | POA: Diagnosis not present

## 2021-10-14 DIAGNOSIS — N186 End stage renal disease: Secondary | ICD-10-CM | POA: Diagnosis not present

## 2021-10-14 NOTE — Telephone Encounter (Signed)
PA started for injection to be done at the office. Patient may need to go to pharmacy to get this RX to bring in to the office. Waiting on PA decision from the insurance.

## 2021-10-16 DIAGNOSIS — N2581 Secondary hyperparathyroidism of renal origin: Secondary | ICD-10-CM | POA: Diagnosis not present

## 2021-10-16 DIAGNOSIS — D631 Anemia in chronic kidney disease: Secondary | ICD-10-CM | POA: Diagnosis not present

## 2021-10-16 DIAGNOSIS — D689 Coagulation defect, unspecified: Secondary | ICD-10-CM | POA: Diagnosis not present

## 2021-10-16 DIAGNOSIS — Z992 Dependence on renal dialysis: Secondary | ICD-10-CM | POA: Diagnosis not present

## 2021-10-16 DIAGNOSIS — N186 End stage renal disease: Secondary | ICD-10-CM | POA: Diagnosis not present

## 2021-10-17 MED ORDER — DENOSUMAB 60 MG/ML ~~LOC~~ SOSY: 60.0000 mg | PREFILLED_SYRINGE | Freq: Once | SUBCUTANEOUS | 0 refills | Status: DC

## 2021-10-17 NOTE — Telephone Encounter (Signed)
Kimberly Frost, Kimberly Frost [id number] Approved 10-14-2021 10-15-2022

## 2021-10-17 NOTE — Addendum Note (Signed)
Addended by: Kris Mouton on: 10/17/2021 12:24 PM   Modules accepted: Orders

## 2021-10-17 NOTE — Telephone Encounter (Signed)
Spoke with Prolia rep, Joycelyn Schmid, patient would owe $315 if ordered through our office but should not pay anything if medication ordered through pharmacy. RX sent in to [facility] mail order. Patient aware to be on the look out for a call from them and if no one calls provided their number to call them directly as they will need to verify certain information with a patient in order to process this request.

## 2021-10-18 DIAGNOSIS — N2581 Secondary hyperparathyroidism of renal origin: Secondary | ICD-10-CM | POA: Diagnosis not present

## 2021-10-18 DIAGNOSIS — Z992 Dependence on renal dialysis: Secondary | ICD-10-CM | POA: Diagnosis not present

## 2021-10-18 DIAGNOSIS — D631 Anemia in chronic kidney disease: Secondary | ICD-10-CM | POA: Diagnosis not present

## 2021-10-18 DIAGNOSIS — N186 End stage renal disease: Secondary | ICD-10-CM | POA: Diagnosis not present

## 2021-10-18 DIAGNOSIS — D689 Coagulation defect, unspecified: Secondary | ICD-10-CM | POA: Diagnosis not present

## 2021-10-21 DIAGNOSIS — N186 End stage renal disease: Secondary | ICD-10-CM | POA: Diagnosis not present

## 2021-10-21 DIAGNOSIS — N2581 Secondary hyperparathyroidism of renal origin: Secondary | ICD-10-CM | POA: Diagnosis not present

## 2021-10-21 DIAGNOSIS — D631 Anemia in chronic kidney disease: Secondary | ICD-10-CM | POA: Diagnosis not present

## 2021-10-21 DIAGNOSIS — D689 Coagulation defect, unspecified: Secondary | ICD-10-CM | POA: Diagnosis not present

## 2021-10-21 DIAGNOSIS — Z992 Dependence on renal dialysis: Secondary | ICD-10-CM | POA: Diagnosis not present

## 2021-10-21 MED ORDER — DENOSUMAB 60 MG/ML ~~LOC~~ SOSY: 60.0000 mg | PREFILLED_SYRINGE | Freq: Once | SUBCUTANEOUS | 0 refills | Status: AC

## 2021-10-21 NOTE — Addendum Note (Signed)
Addended by: Kris Mouton on: 10/21/2021 04:30 PM   Modules accepted: Orders

## 2021-10-21 NOTE — Telephone Encounter (Signed)
Called patient to let her know RX was sent to [facility], patient will need to pick it up from them to bring it to her nurse visit appointment. Need to schedule her lab and NV appointment

## 2021-10-21 NOTE — Addendum Note (Signed)
Addended by: Kris Mouton on: 10/21/2021 03:35 PM   Modules accepted: Orders

## 2021-10-21 NOTE — Telephone Encounter (Signed)
Patient advised. Lab appt on 10/29/21 and NV 10/31/21

## 2021-10-21 NOTE — Telephone Encounter (Signed)
Patient called in returning a call she received.

## 2021-10-21 NOTE — Telephone Encounter (Signed)
Patient called in and said that [facility] in [address] would fill her prescription,.

## 2021-10-21 NOTE — Telephone Encounter (Signed)
Spoke with patient and [facility], [facility] not able to find [facility] for their system, it has to be a different mail order pharmacy or local pharmacy. Patient aware and will call insurance to find out which one she can use

## 2021-10-23 DIAGNOSIS — N2581 Secondary hyperparathyroidism of renal origin: Secondary | ICD-10-CM | POA: Diagnosis not present

## 2021-10-23 DIAGNOSIS — D689 Coagulation defect, unspecified: Secondary | ICD-10-CM | POA: Diagnosis not present

## 2021-10-23 DIAGNOSIS — D631 Anemia in chronic kidney disease: Secondary | ICD-10-CM | POA: Diagnosis not present

## 2021-10-23 DIAGNOSIS — N186 End stage renal disease: Secondary | ICD-10-CM | POA: Diagnosis not present

## 2021-10-23 DIAGNOSIS — Z992 Dependence on renal dialysis: Secondary | ICD-10-CM | POA: Diagnosis not present

## 2021-10-25 DIAGNOSIS — D631 Anemia in chronic kidney disease: Secondary | ICD-10-CM | POA: Diagnosis not present

## 2021-10-25 DIAGNOSIS — N2581 Secondary hyperparathyroidism of renal origin: Secondary | ICD-10-CM | POA: Diagnosis not present

## 2021-10-25 DIAGNOSIS — D689 Coagulation defect, unspecified: Secondary | ICD-10-CM | POA: Diagnosis not present

## 2021-10-25 DIAGNOSIS — N186 End stage renal disease: Secondary | ICD-10-CM | POA: Diagnosis not present

## 2021-10-25 DIAGNOSIS — Z992 Dependence on renal dialysis: Secondary | ICD-10-CM | POA: Diagnosis not present

## 2021-10-28 DIAGNOSIS — Z992 Dependence on renal dialysis: Secondary | ICD-10-CM | POA: Diagnosis not present

## 2021-10-28 DIAGNOSIS — N186 End stage renal disease: Secondary | ICD-10-CM | POA: Diagnosis not present

## 2021-10-28 DIAGNOSIS — D631 Anemia in chronic kidney disease: Secondary | ICD-10-CM | POA: Diagnosis not present

## 2021-10-28 DIAGNOSIS — D689 Coagulation defect, unspecified: Secondary | ICD-10-CM | POA: Diagnosis not present

## 2021-10-28 DIAGNOSIS — N2581 Secondary hyperparathyroidism of renal origin: Secondary | ICD-10-CM | POA: Diagnosis not present

## 2021-10-29 ENCOUNTER — Other Ambulatory Visit (INDEPENDENT_AMBULATORY_CARE_PROVIDER_SITE_OTHER): Payer: Medicare Other

## 2021-10-29 ENCOUNTER — Telehealth: Payer: Self-pay

## 2021-10-29 DIAGNOSIS — M810 Age-related osteoporosis without current pathological fracture: Secondary | ICD-10-CM | POA: Diagnosis not present

## 2021-10-29 LAB — BASIC METABOLIC PANEL
BUN: 35 mg/dL — ABNORMAL HIGH (ref 6–23)
CO2: 33 mEq/L — ABNORMAL HIGH (ref 19–32)
Calcium: 8.4 mg/dL (ref 8.4–10.5)
Chloride: 95 mEq/L — ABNORMAL LOW (ref 96–112)
Creatinine, Ser: 7.77 mg/dL (ref 0.40–1.20)
GFR: 4.87 mL/min — CL (ref >60.00)
Glucose, Bld: 106 mg/dL — ABNORMAL HIGH (ref 70–99)
Potassium: 4.1 mEq/L (ref 3.5–5.1)
Sodium: 139 mEq/L (ref 135–145)

## 2021-10-29 NOTE — Telephone Encounter (Signed)
CRITICAL VALUE STICKER  CRITICAL VALUE: Creatinine 7.77 GFR 4.87  RECEIVER (on-site recipient of call): Kimberly Frost   DATE & TIME NOTIFIED: 9/27 at 3:10PM   MESSENGER (representative from lab):SAR   MD NOTIFIED: K Calrk   TIME OF NOTIFICATION: 3:15pm   RESPONSE: advised of lab will review and advise

## 2021-10-29 NOTE — Telephone Encounter (Signed)
Noted. Dialysis patient and renal function improved compared to prior testing. Okay to wait for PCP.

## 2021-10-30 DIAGNOSIS — D689 Coagulation defect, unspecified: Secondary | ICD-10-CM | POA: Diagnosis not present

## 2021-10-30 DIAGNOSIS — D631 Anemia in chronic kidney disease: Secondary | ICD-10-CM | POA: Diagnosis not present

## 2021-10-30 DIAGNOSIS — Z992 Dependence on renal dialysis: Secondary | ICD-10-CM | POA: Diagnosis not present

## 2021-10-30 DIAGNOSIS — N186 End stage renal disease: Secondary | ICD-10-CM | POA: Diagnosis not present

## 2021-10-30 DIAGNOSIS — N2581 Secondary hyperparathyroidism of renal origin: Secondary | ICD-10-CM | POA: Diagnosis not present

## 2021-10-30 NOTE — Telephone Encounter (Signed)
Kidney function stable.  Please advise pt to f/u with dialysis

## 2021-10-30 NOTE — Progress Notes (Signed)
Duplicate  Please have patient make sure that she follows up with dialysis Findings stable

## 2021-10-30 NOTE — Telephone Encounter (Signed)
Called patient reviewed all information and repeated back to me. Will call if any questions.  ? ?

## 2021-10-31 ENCOUNTER — Ambulatory Visit (INDEPENDENT_AMBULATORY_CARE_PROVIDER_SITE_OTHER): Payer: Medicare Other

## 2021-10-31 DIAGNOSIS — M810 Age-related osteoporosis without current pathological fracture: Secondary | ICD-10-CM | POA: Diagnosis not present

## 2021-10-31 MED ORDER — DENOSUMAB 60 MG/ML ~~LOC~~ SOSY
60.0000 mg | PREFILLED_SYRINGE | Freq: Once | SUBCUTANEOUS | Status: AC
  Administered 2021-10-31 (×2): 60 mg via SUBCUTANEOUS

## 2021-10-31 NOTE — Progress Notes (Signed)
Per orders of Eugenia Pancoast, NP, injection of Prolia given by Brenton Grills. Patient tolerated injection well.

## 2021-11-01 DIAGNOSIS — N186 End stage renal disease: Secondary | ICD-10-CM | POA: Diagnosis not present

## 2021-11-01 DIAGNOSIS — Z992 Dependence on renal dialysis: Secondary | ICD-10-CM | POA: Diagnosis not present

## 2021-11-01 DIAGNOSIS — D689 Coagulation defect, unspecified: Secondary | ICD-10-CM | POA: Diagnosis not present

## 2021-11-01 DIAGNOSIS — D631 Anemia in chronic kidney disease: Secondary | ICD-10-CM | POA: Diagnosis not present

## 2021-11-01 DIAGNOSIS — N2581 Secondary hyperparathyroidism of renal origin: Secondary | ICD-10-CM | POA: Diagnosis not present

## 2021-11-01 DIAGNOSIS — I129 Hypertensive chronic kidney disease with stage 1 through stage 4 chronic kidney disease, or unspecified chronic kidney disease: Secondary | ICD-10-CM | POA: Diagnosis not present

## 2021-11-05 DIAGNOSIS — D509 Iron deficiency anemia, unspecified: Secondary | ICD-10-CM | POA: Diagnosis not present

## 2021-11-05 DIAGNOSIS — N186 End stage renal disease: Secondary | ICD-10-CM | POA: Diagnosis not present

## 2021-11-05 DIAGNOSIS — Z992 Dependence on renal dialysis: Secondary | ICD-10-CM | POA: Diagnosis not present

## 2021-11-05 DIAGNOSIS — N2581 Secondary hyperparathyroidism of renal origin: Secondary | ICD-10-CM | POA: Diagnosis not present

## 2021-11-05 DIAGNOSIS — D508 Other iron deficiency anemias: Secondary | ICD-10-CM | POA: Diagnosis not present

## 2021-11-05 DIAGNOSIS — D631 Anemia in chronic kidney disease: Secondary | ICD-10-CM | POA: Diagnosis not present

## 2021-11-05 DIAGNOSIS — D689 Coagulation defect, unspecified: Secondary | ICD-10-CM | POA: Diagnosis not present

## 2021-11-06 DIAGNOSIS — D689 Coagulation defect, unspecified: Secondary | ICD-10-CM | POA: Diagnosis not present

## 2021-11-06 DIAGNOSIS — N186 End stage renal disease: Secondary | ICD-10-CM | POA: Diagnosis not present

## 2021-11-06 DIAGNOSIS — D631 Anemia in chronic kidney disease: Secondary | ICD-10-CM | POA: Diagnosis not present

## 2021-11-06 DIAGNOSIS — D508 Other iron deficiency anemias: Secondary | ICD-10-CM | POA: Diagnosis not present

## 2021-11-06 DIAGNOSIS — D509 Iron deficiency anemia, unspecified: Secondary | ICD-10-CM | POA: Diagnosis not present

## 2021-11-06 DIAGNOSIS — Z992 Dependence on renal dialysis: Secondary | ICD-10-CM | POA: Diagnosis not present

## 2021-11-06 DIAGNOSIS — N2581 Secondary hyperparathyroidism of renal origin: Secondary | ICD-10-CM | POA: Diagnosis not present

## 2021-11-08 DIAGNOSIS — D509 Iron deficiency anemia, unspecified: Secondary | ICD-10-CM | POA: Diagnosis not present

## 2021-11-08 DIAGNOSIS — D508 Other iron deficiency anemias: Secondary | ICD-10-CM | POA: Diagnosis not present

## 2021-11-08 DIAGNOSIS — Z992 Dependence on renal dialysis: Secondary | ICD-10-CM | POA: Diagnosis not present

## 2021-11-08 DIAGNOSIS — N2581 Secondary hyperparathyroidism of renal origin: Secondary | ICD-10-CM | POA: Diagnosis not present

## 2021-11-08 DIAGNOSIS — D631 Anemia in chronic kidney disease: Secondary | ICD-10-CM | POA: Diagnosis not present

## 2021-11-08 DIAGNOSIS — D689 Coagulation defect, unspecified: Secondary | ICD-10-CM | POA: Diagnosis not present

## 2021-11-08 DIAGNOSIS — N186 End stage renal disease: Secondary | ICD-10-CM | POA: Diagnosis not present

## 2021-11-09 ENCOUNTER — Other Ambulatory Visit: Payer: Self-pay

## 2021-11-09 ENCOUNTER — Emergency Department: Payer: Medicare Other

## 2021-11-09 ENCOUNTER — Encounter: Payer: Self-pay | Admitting: Emergency Medicine

## 2021-11-09 ENCOUNTER — Observation Stay: Payer: Medicare Other

## 2021-11-09 ENCOUNTER — Observation Stay
Admission: EM | Admit: 2021-11-09 | Discharge: 2021-11-10 | Disposition: A | Discharge status: Home Health | Payer: Medicare Other | Attending: Family Medicine | Admitting: Family Medicine

## 2021-11-09 DIAGNOSIS — E785 Hyperlipidemia, unspecified: Secondary | ICD-10-CM | POA: Diagnosis not present

## 2021-11-09 DIAGNOSIS — R42 Dizziness and giddiness: Secondary | ICD-10-CM | POA: Diagnosis not present

## 2021-11-09 DIAGNOSIS — Z87891 Personal history of nicotine dependence: Secondary | ICD-10-CM | POA: Insufficient documentation

## 2021-11-09 DIAGNOSIS — I120 Hypertensive chronic kidney disease with stage 5 chronic kidney disease or end stage renal disease: Secondary | ICD-10-CM | POA: Insufficient documentation

## 2021-11-09 DIAGNOSIS — R2689 Other abnormalities of gait and mobility: Secondary | ICD-10-CM | POA: Insufficient documentation

## 2021-11-09 DIAGNOSIS — D631 Anemia in chronic kidney disease: Secondary | ICD-10-CM | POA: Diagnosis not present

## 2021-11-09 DIAGNOSIS — E559 Vitamin D deficiency, unspecified: Secondary | ICD-10-CM | POA: Diagnosis not present

## 2021-11-09 DIAGNOSIS — R262 Difficulty in walking, not elsewhere classified: Secondary | ICD-10-CM | POA: Insufficient documentation

## 2021-11-09 DIAGNOSIS — I10 Essential (primary) hypertension: Secondary | ICD-10-CM | POA: Diagnosis present

## 2021-11-09 DIAGNOSIS — N186 End stage renal disease: Secondary | ICD-10-CM

## 2021-11-09 DIAGNOSIS — Z79899 Other long term (current) drug therapy: Secondary | ICD-10-CM | POA: Insufficient documentation

## 2021-11-09 DIAGNOSIS — Z20822 Contact with and (suspected) exposure to covid-19: Secondary | ICD-10-CM | POA: Insufficient documentation

## 2021-11-09 DIAGNOSIS — R9431 Abnormal electrocardiogram [ECG] [EKG]: Secondary | ICD-10-CM

## 2021-11-09 DIAGNOSIS — Z992 Dependence on renal dialysis: Secondary | ICD-10-CM | POA: Diagnosis not present

## 2021-11-09 DIAGNOSIS — Z85118 Personal history of other malignant neoplasm of bronchus and lung: Secondary | ICD-10-CM | POA: Diagnosis not present

## 2021-11-09 DIAGNOSIS — R531 Weakness: Secondary | ICD-10-CM | POA: Diagnosis not present

## 2021-11-09 DIAGNOSIS — I619 Nontraumatic intracerebral hemorrhage, unspecified: Secondary | ICD-10-CM | POA: Diagnosis not present

## 2021-11-09 DIAGNOSIS — I6782 Cerebral ischemia: Secondary | ICD-10-CM | POA: Diagnosis not present

## 2021-11-09 LAB — BASIC METABOLIC PANEL
Anion gap: 14 (ref 5–15)
BUN: 38 mg/dL — ABNORMAL HIGH (ref 8–23)
CO2: 27 mmol/L (ref 22–32)
Calcium: 6.3 mg/dL — CL (ref 8.9–10.3)
Chloride: 92 mmol/L — ABNORMAL LOW (ref 98–111)
Creatinine, Ser: 7.54 mg/dL — ABNORMAL HIGH (ref 0.44–1.00)
GFR, Estimated: 5 mL/min — ABNORMAL LOW (ref >60)
Glucose, Bld: 129 mg/dL — ABNORMAL HIGH (ref 70–99)
Potassium: 4.3 mmol/L (ref 3.5–5.1)
Sodium: 133 mmol/L — ABNORMAL LOW (ref 135–145)

## 2021-11-09 LAB — HEPATIC FUNCTION PANEL
ALT: 28 U/L (ref 0–44)
AST: 40 U/L (ref 15–41)
Albumin: 4.2 g/dL (ref 3.5–5.0)
Alkaline Phosphatase: 159 U/L — ABNORMAL HIGH (ref 38–126)
Bilirubin, Direct: <0.1 mg/dL (ref 0.0–0.2)
Total Bilirubin: 0.9 mg/dL (ref 0.3–1.2)
Total Protein: 7.9 g/dL (ref 6.5–8.1)

## 2021-11-09 LAB — CBC
HCT: 34.3 % — ABNORMAL LOW (ref 36.0–46.0)
Hemoglobin: 11.4 g/dL — ABNORMAL LOW (ref 12.0–15.0)
MCH: 28.2 pg (ref 26.0–34.0)
MCHC: 33.2 g/dL (ref 30.0–36.0)
MCV: 84.9 fL (ref 80.0–100.0)
Platelets: 258 10*3/uL (ref 150–400)
RBC: 4.04 MIL/uL (ref 3.87–5.11)
RDW: 14.3 % (ref 11.5–15.5)
WBC: 6.7 10*3/uL (ref 4.0–10.5)
nRBC: 0 % (ref 0.0–0.2)

## 2021-11-09 LAB — MAGNESIUM: Magnesium: 2.4 mg/dL (ref 1.7–2.4)

## 2021-11-09 LAB — SARS CORONAVIRUS 2 BY RT PCR: SARS Coronavirus 2 by RT PCR: NEGATIVE

## 2021-11-09 LAB — PHOSPHORUS: Phosphorus: 2.8 mg/dL (ref 2.5–4.6)

## 2021-11-09 MED ORDER — CALCIUM GLUCONATE-NACL 1-0.675 GM/50ML-% IV SOLN
1.0000 g | Freq: Once | INTRAVENOUS | Status: AC
  Administered 2021-11-09: 1000 mg via INTRAVENOUS
  Filled 2021-11-09: qty 50

## 2021-11-09 MED ORDER — ONDANSETRON HCL 4 MG/2ML IJ SOLN: 4.0000 mg | Freq: Three times a day (TID) | INTRAMUSCULAR | Status: DC | PRN

## 2021-11-09 MED ORDER — HYDRALAZINE HCL 20 MG/ML IJ SOLN: 5.0000 mg | INTRAMUSCULAR | Status: DC | PRN

## 2021-11-09 MED ORDER — ATORVASTATIN CALCIUM 20 MG PO TABS: 40.0000 mg | ORAL_TABLET | Freq: Every evening | ORAL | Status: DC

## 2021-11-09 MED ORDER — ZOLPIDEM TARTRATE 5 MG PO TABS
5.0000 mg | ORAL_TABLET | Freq: Every evening | ORAL | Status: DC | PRN
  Administered 2021-11-10: 5 mg via ORAL
  Filled 2021-11-09: qty 1

## 2021-11-09 MED ORDER — HEPARIN SODIUM (PORCINE) 5000 UNIT/ML IJ SOLN
5000.0000 [IU] | Freq: Three times a day (TID) | INTRAMUSCULAR | Status: DC
  Administered 2021-11-09 – 2021-11-10 (×3): 5000 [IU] via SUBCUTANEOUS
  Filled 2021-11-09 (×3): qty 1

## 2021-11-09 MED ORDER — SUCROFERRIC OXYHYDROXIDE 500 MG PO CHEW: 500.0000 mg | CHEWABLE_TABLET | ORAL | Status: DC | PRN

## 2021-11-09 MED ORDER — ACETAMINOPHEN 325 MG PO TABS: 650.0000 mg | ORAL_TABLET | Freq: Four times a day (QID) | ORAL | Status: DC | PRN

## 2021-11-09 MED ORDER — RENA-VITE PO TABS
1.0000 | ORAL_TABLET | Freq: Every day | ORAL | Status: DC
  Administered 2021-11-10: 1 via ORAL
  Filled 2021-11-09: qty 1

## 2021-11-09 MED ORDER — SUCROFERRIC OXYHYDROXIDE 500 MG PO CHEW
1000.0000 mg | CHEWABLE_TABLET | Freq: Three times a day (TID) | ORAL | Status: DC
  Administered 2021-11-10 (×2): 1000 mg via ORAL
  Filled 2021-11-09 (×3): qty 2

## 2021-11-09 MED ORDER — GABAPENTIN 100 MG PO CAPS
200.0000 mg | ORAL_CAPSULE | Freq: Every day | ORAL | Status: DC
  Filled 2021-11-09: qty 2

## 2021-11-09 MED ORDER — CALCIUM CARBONATE 1250 (500 CA) MG PO TABS
1.0000 | ORAL_TABLET | Freq: Every day | ORAL | Status: DC
  Administered 2021-11-10: 1250 mg via ORAL
  Filled 2021-11-09: qty 1

## 2021-11-09 MED ORDER — SUCROFERRIC OXYHYDROXIDE 500 MG PO CHEW: 1000.0000 mg | CHEWABLE_TABLET | ORAL | Status: DC

## 2021-11-09 NOTE — Assessment & Plan Note (Signed)
-  consulted Dr. Candiss Norse of renal

## 2021-11-09 NOTE — H&P (Signed)
History and Physical    Kimberly Frost:[id number] DOB: Jan 05, 1951 DOA: 11/09/2021  Referring MD/NP/PA:   PCP: Kimberly Pancoast, FNP   Patient coming from:  The patient is coming from home.  At baseline, pt is independent for most of ADL.        Chief Complaint: muscle cramping, dizziness  HPI: Kimberly Frost is a 71 y.o. female with medical history significant of ESRD-HD (TTS), HTN, HLD, NSCLC (s/p of XRT), HCV, anemia, who presents with muscle cramping and dizziness.  Patient states that she has diffuse muscle cramps for 5 days.  She has generalized weakness, dizziness, sometimes with blurry vision in both eyes.  No unilateral weakness in extremities.  No facial droop or slurred speech.  Patient does not have chest pain, cough, shortness breath.  No nausea, vomiting, diarrhea or abdominal pain.  No symptoms of UTI.  Her last dialysis was yesterday.  Data reviewed independently and ED Course: pt was found to have hypocalcemia with calcium 6.3, potassium 4.3, bicarbonate 27, creatinine 7.54, BUN 38, WBC 6.7, negative COVID PCR, magnesium 2.4, phosphorus 2.8, temperature normal, blood pressure 154/74, heart rate 85, RR 21, oxygen saturation 99% on room air.  Chest x-ray negative.  Patient is placed on telemetry bed for observation.   EKG: I have personally reviewed.  Sinus rhythm, QTc 497, LAE, poor R wave progression, LAD.   Review of Systems:   General: no fevers, chills, no body weight gain, has fatigue HEENT: no blurry vision, hearing changes or sore throat Respiratory: no dyspnea, coughing, wheezing CV: no chest pain, no palpitations GI: no nausea, vomiting, abdominal pain, diarrhea, constipation GU: no dysuria, burning on urination, increased urinary frequency, hematuria  Ext: no leg edema Neuro: no unilateral weakness, hearing loss.  Has dizziness due to blurry vision Skin: no rash, no skin tear. MSK: No muscle spasm, no deformity, no limitation of range of movement in spin.   Has a diffused muscle cramps. Heme: No easy bruising.  Travel history: No recent long distant travel.   Allergy: No Known Allergies  Past Medical History:  Diagnosis Date   Chronic kidney disease    Edema    RLE   Hepatitis    Hep C   History of blood transfusion    History of radiation therapy    right lung 02/24/2021-03/10/2021  Dr Gery Pray   Hypertension    Lung cancer [facility])    Wears dentures     Past Surgical History:  Procedure Laterality Date   A/V FISTULAGRAM Left 04/11/2019   Procedure: A/V FISTULAGRAM;  Surgeon: Katha Cabal, MD;  Location: [place] [facility];  Service: Cardiovascular;  Laterality: Left;   A/V FISTULAGRAM Left 05/01/2019   Procedure: A/V FISTULAGRAM;  Surgeon: Algernon Huxley, MD;  Location: [place] [facility];  Service: Cardiovascular;  Laterality: Left;   A/V FISTULAGRAM Left 01/15/2020   Procedure: A/V FISTULAGRAM;  Surgeon: Algernon Huxley, MD;  Location: [place] [facility];  Service: Cardiovascular;  Laterality: Left;   A/V FISTULAGRAM Left 04/22/2020   Procedure: A/V FISTULAGRAM;  Surgeon: Algernon Huxley, MD;  Location: [place] [facility];  Service: Cardiovascular;  Laterality: Left;   A/V FISTULAGRAM Left 06/25/2021   Procedure: A/V Fistulagram;  Surgeon: Katha Cabal, MD;  Location: [place] [facility];  Service: Cardiovascular;  Laterality: Left;   AV FISTULA PLACEMENT Left 06/16/2018   Procedure: Left Arm ARTERIOVENOUS (AV) FISTULA CREATION;  Surgeon: Marty Heck, MD;  Location: [facility]  OR;  Service: Vascular;  Laterality: Left;   MULTIPLE TOOTH EXTRACTIONS     MYOMECTOMY      Social History:  reports that she quit smoking about 33 years ago. Her smoking use included cigarettes. She has never used smokeless tobacco. She reports that she does not currently use alcohol after a past usage of about 6.0 standard drinks of alcohol per week. She reports that she does not currently use drugs after having used the following drugs:  Marijuana.  Family History:  Family History  Problem Relation Age of Onset   Diabetes Mother    Hypertension Mother    Breast cancer Mother 75   Bone cancer Father    Stroke Brother    Diabetes Brother    Drug abuse Brother    Breast cancer Maternal Aunt 50   Breast cancer Maternal Aunt 70   Kidney cancer Maternal Grandmother      Prior to Admission medications   Medication Sig Start Date End Date Taking? Authorizing Provider  acetaminophen (TYLENOL) 500 MG tablet Take 1,000 mg by mouth every 6 (six) hours as needed for moderate pain or headache.    [provider]  atorvastatin (LIPITOR) 40 MG tablet Take 1 tablet (40 mg total) by mouth daily. 08/04/21   Kimberly Pancoast, FNP  gabapentin (NEURONTIN) 100 MG capsule Take 100 mg by mouth at bedtime. 05/20/21   [provider]  iron sucrose in sodium chloride 0.9 % 100 mL Iron Sucrose (Venofer) 02/01/21 01/31/22  [provider]  midodrine (PROAMATINE) 10 MG tablet Take by mouth. 12/14/19   [provider]  multivitamin (RENA-VIT) TABS tablet Take 1 tablet by mouth daily. 01/06/19   [provider]  VELPHORO 500 MG chewable tablet Chew 1,000 mg by mouth See admin instructions. Take 1000 mg with meals and 500 mg with snacks 01/19/19   [provider]    Physical Exam: Vitals:   11/09/21 1630 11/09/21 1700 11/09/21 1730 11/09/21 1738  BP: (!) 162/78 137/79  (!) 145/74  Pulse: 89 85  89  Resp: 20 19  18   Temp:   98.4 F (36.9 C)   TempSrc:   Oral   SpO2: 96% 100%  98%  Weight:      Height:       General: Not in acute distress HEENT:       Eyes: PERRL, EOMI, no scleral icterus.       ENT: No discharge from the ears and nose, no pharynx injection, no tonsillar enlargement.        Neck: No JVD, no bruit, no mass felt. Heme: No neck lymph node enlargement. Cardiac: S1/S2, RRR, No murmurs, No gallops or rubs. Respiratory: No rales, wheezing, rhonchi or rubs. GI: Soft, nondistended,  nontender, no rebound pain, no organomegaly, BS present. GU: No hematuria Ext: No pitting leg edema bilaterally. 1+DP/PT pulse bilaterally. Musculoskeletal: No joint deformities, No joint redness or warmth, no limitation of ROM in spin. Skin: No rashes.  Neuro: Alert, oriented X3, cranial nerves II-XII grossly intact, moves all extremities normally.  Psych: Patient is not psychotic, no suicidal or hemocidal ideation.  Labs on Admission: I have personally reviewed following labs and imaging studies  CBC: Recent Labs  Lab 11/09/21 1006  WBC 6.7  HGB 11.4*  HCT 34.3*  MCV 84.9  PLT 831   Basic Metabolic Panel: Recent Labs  Lab 11/09/21 1006  NA 133*  K 4.3  CL 92*  CO2 27  GLUCOSE 129*  BUN  38*  CREATININE 7.54*  CALCIUM 6.3*  MG 2.4  PHOS 2.8   GFR: Estimated Creatinine Clearance: 6.5 mL/min (A) (by C-G formula based on SCr of 7.54 mg/dL (H)). Liver Function Tests: Recent Labs  Lab 11/09/21 1006  AST 40  ALT 28  ALKPHOS 159*  BILITOT 0.9  PROT 7.9  ALBUMIN 4.2   No results for input(s): "LIPASE", "AMYLASE" in the last 168 hours. No results for input(s): "AMMONIA" in the last 168 hours. Coagulation Profile: No results for input(s): "INR", "PROTIME" in the last 168 hours. Cardiac Enzymes: No results for input(s): "CKTOTAL", "CKMB", "CKMBINDEX", "TROPONINI" in the last 168 hours. BNP (last 3 results) No results for input(s): "PROBNP" in the last 8760 hours. HbA1C: No results for input(s): "HGBA1C" in the last 72 hours. CBG: No results for input(s): "GLUCAP" in the last 168 hours. Lipid Profile: No results for input(s): "CHOL", "HDL", "LDLCALC", "TRIG", "CHOLHDL", "LDLDIRECT" in the last 72 hours. Thyroid Function Tests: No results for input(s): "TSH", "T4TOTAL", "FREET4", "T3FREE", "THYROIDAB" in the last 72 hours. Anemia Panel: No results for input(s): "VITAMINB12", "FOLATE", "FERRITIN", "TIBC", "IRON", "RETICCTPCT" in the last 72 hours. Urine analysis:     Component Value Date/Time   COLORURINE YELLOW 02/26/2018 1150   APPEARANCEUR HAZY (A) 02/26/2018 1150   LABSPEC 1.011 02/26/2018 1150   PHURINE 6.0 02/26/2018 1150   GLUCOSEU NEGATIVE 02/26/2018 1150   HGBUR SMALL (A) 02/26/2018 1150   BILIRUBINUR NEGATIVE 02/26/2018 1150   KETONESUR NEGATIVE 02/26/2018 1150   PROTEINUR 30 (A) 02/26/2018 1150   UROBILINOGEN 0.2 01/27/2013 1605   NITRITE NEGATIVE 02/26/2018 1150   LEUKOCYTESUR LARGE (A) 02/26/2018 1150   Sepsis Labs: @LABRCNTIP (procalcitonin:4,lacticidven:4) ) Recent Results (from the past 240 hour(s))  SARS Coronavirus 2 by RT PCR (hospital order, performed in [place] hospital lab) *cepheid single result test* Anterior Nasal Swab     Status: None   Collection Time: 11/09/21 12:32 PM   Specimen: Anterior Nasal Swab  Result Value Ref Range Status   SARS Coronavirus 2 by RT PCR NEGATIVE NEGATIVE Final    Comment: (NOTE) SARS-CoV-2 target nucleic acids are NOT DETECTED.  The SARS-CoV-2 RNA is generally detectable in upper and lower respiratory specimens during the acute phase of infection. The lowest concentration of SARS-CoV-2 viral copies this assay can detect is 250 copies / mL. A negative result does not preclude SARS-CoV-2 infection and should not be used as the sole basis for treatment or other patient management decisions.  A negative result may occur with improper specimen collection / handling, submission of specimen other than nasopharyngeal swab, presence of viral mutation(s) within the areas targeted by this assay, and inadequate number of viral copies (<250 copies / mL). A negative result must be combined with clinical observations, patient history, and epidemiological information.  Fact Sheet for Patients:   https://www.patel.info/  Fact Sheet for Healthcare Providers: https://hall.com/  This test is not yet approved or  cleared by the [place] FDA and has  been authorized for detection and/or diagnosis of SARS-CoV-2 by FDA under an Emergency Use Authorization (EUA).  This EUA will remain in effect (meaning this test can be used) for the duration of the COVID-19 declaration under Section 564(b)(1) of the Act, 21 U.S.C. section 360bbb-3(b)(1), unless the authorization is terminated or revoked sooner.  Performed at [facility], [address], [place], [place] [address]      Radiological Exams on Admission: CT HEAD WO CONTRAST (5MM)  Result Date: 11/09/2021 CLINICAL DATA:  71 year old  female with weakness and dizziness. EXAM: CT HEAD WITHOUT CONTRAST TECHNIQUE: Contiguous axial images were obtained from the base of the skull through the vertex without intravenous contrast. RADIATION DOSE REDUCTION: This exam was performed according to the departmental dose-optimization program which includes automated exposure control, adjustment of the mA and/or kV according to patient size and/or use of iterative reconstruction technique. COMPARISON:  None Available. FINDINGS: Brain: No evidence of acute infarction, hemorrhage, hydrocephalus, extra-axial collection or mass lesion/mass effect. Atrophy noted. Mild-to-moderate white matter hypodensities probably represents chronic small-vessel white matter ischemic changes. A small remote RIGHT cerebellar infarct is present. Vascular: Carotid and vertebral atherosclerotic calcifications are noted. Skull: Normal. Negative for fracture or focal lesion. Sinuses/Orbits: No acute finding. Other: None. IMPRESSION: 1. No evidence of acute intracranial abnormality. 2. Atrophy and probable chronic small-vessel white matter ischemic changes. Small remote RIGHT cerebellar infarct. Electronically Signed   By: Margarette Canada M.D.   On: 11/09/2021 13:33   DG Chest 2 View  Result Date: 11/09/2021 CLINICAL DATA:  Weakness and dizziness. Dialysis patient. History of right upper lobe carcinoma. EXAM: CHEST - 2 VIEW COMPARISON:   CT, 02/08/2021.  Radiographs, 09/04/2020. FINDINGS: Right upper lobe opacity is similar to the prior exams, consistent with scarring. Cannot exclude viable underlying neoplasm. Remainder of the lungs is clear. No pleural effusion or pneumothorax. Cardiac silhouette is normal in size. No mediastinal or hilar masses. No convincing adenopathy. Skeletal structures are intact. IMPRESSION: 1. No acute cardiopulmonary disease. Electronically Signed   By: Lajean Manes M.D.   On: 11/09/2021 13:14      Assessment/Plan Principal Problem:   Hypocalcemia Active Problems:   Dizziness   ESRD (end stage renal disease) on dialysis (HCC)   HTN (hypertension)   Hyperlipemia   Anemia in ESRD (end-stage renal disease) (HCC)   Assessment and Plan: * Hypocalcemia Patient's muscle cramps is most likely due to hypocalcemia.  Calcium 6.3. -Placed on telemetry bed for position -Total of 2 g of calcium gluconate was given in ED -start Os-Cal -Check vitamin D1,25 level   Dizziness Etiology is not clear.  Patient reports dizziness and intermittent blurred vision. -Frequent neurochecks -Fall precaution -As needed meclizine -Follow-up MRI for brain to rule out stroke -pt.ot  ESRD (end stage renal disease) on dialysis [facility]) -consulted Dr. Candiss Norse of renal  HTN (hypertension) -IV hydralazine as needed  Hyperlipemia - Lipitor  Anemia in ESRD (end-stage renal disease) ([place]) Hemoglobin stable 11.4 -Follow-up with CBC          DVT ppx: SQ Heparin     Code Status: Full code  Family Communication: I offered to call her family, but patient states that she already called her family, I do not need to call them.  Disposition Plan:  Anticipate discharge back to previous environment  Consults called:  none  Admission status and Level of care: Telemetry Medical:     for obs    Dispo: The patient is from: Home              Anticipated d/c is to: Home              Anticipated d/c date is: 1 day               Patient currently is not medically stable to d/c.    Severity of Illness:  The appropriate patient status for this patient is OBSERVATION. Observation status is judged to be reasonable and necessary in order to provide the required intensity of service to  ensure the patient's safety. The patient's presenting symptoms, physical exam findings, and initial radiographic and laboratory data in the context of their medical condition is felt to place them at decreased risk for further clinical deterioration. Furthermore, it is anticipated that the patient will be medically stable for discharge from the hospital within 2 midnights of admission.        Date of Service 11/09/2021    [facility]   If 7PM-7AM, please contact night-coverage [email or web address] 11/09/2021, 7:13 PM

## 2021-11-09 NOTE — Assessment & Plan Note (Signed)
Patient's muscle cramps is most likely due to hypocalcemia.  Calcium 6.3. -Placed on telemetry bed for position -Total of 2 g of calcium gluconate was given in ED -start Os-Cal -Check vitamin D1,25 level

## 2021-11-09 NOTE — Assessment & Plan Note (Signed)
Etiology is not clear.  Patient reports dizziness and intermittent blurred vision. -Frequent neurochecks -Fall precaution -As needed meclizine -Follow-up MRI for brain to rule out stroke -pt.ot

## 2021-11-09 NOTE — ED Triage Notes (Addendum)
Pt reports feels weak, dizzy and just not right. Pt states is a dialysis patient, has treatment on Tuesdays, Thursday and Saturdays and completed her dialysis yesterday. Pt reports was sick Tuesday so they did her dialysis back to back on Wednesday and Thursday and then she went again on Saturday. Pt reports has not felt well since Tuesday. Pt reports people with covid have been there getting dialysis when she was

## 2021-11-09 NOTE — Assessment & Plan Note (Signed)
Lipitor 

## 2021-11-09 NOTE — Assessment & Plan Note (Signed)
Hemoglobin stable 11.4 -Follow-up with CBC

## 2021-11-09 NOTE — ED Provider Notes (Signed)
[facility] Provider Note    Event Date/Time   First MD Initiated Contact with Patient 11/09/21 1207     (approximate)   History   Weakness, Dizziness, and Shortness of Breath   HPI  Kimberly Frost is a 71 y.o. female with history of end-stage renal disease here with generalized weakness, cramping, dizziness, and near falls.  The patient states that for the last several days, she is a progressive worsening generalized weakness.  She had diffuse muscle cramping.  She actually missed dialysis this week then went back to back.  She states she missed because she felt so weak.  She states that she has felt progressively worsened.  She states she gets dizzy when she stands up.  She has had some generalized weakness.  Denies any focal numbness or weakness.  She states she is also had worsening diffuse leg cramping.  She had some mild perioral numbness.  No fevers or chills.  No recent medication changes.     Physical Exam   Triage Vital Signs: ED Triage Vitals  Enc Vitals Group     BP --      Pulse Rate 11/09/21 1010 85     Resp 11/09/21 1010 19     Temp 11/09/21 1010 98.3 F (36.8 C)     Temp Source 11/09/21 1010 Oral     SpO2 11/09/21 1010 95 %     Weight 11/09/21 1004 136 lb 11 oz (62 kg)     Height 11/09/21 1004 5\' 6"  (1.676 m)     Head Circumference --      Peak Flow --      Pain Score 11/09/21 1004 0     Pain Loc --      Pain Edu? --      Excl. in [place]? --     Most recent vital signs: Vitals:   11/09/21 1330 11/09/21 1400  BP: (!) 155/72 (!) 154/74  Pulse: 71 74  Resp: 13 16  Temp:    SpO2: 100% 99%     General: Awake, no distress.  CV:  Good peripheral perfusion.  Regular rate and rhythm. Resp:  Normal effort.  Abd:  No distention.  Other:  Intermittent muscle cramps and spasms.  Mildly dry mucous membranes.  Strength 5 out of 5 bilateral upper and lower extremities.  Cranial nerves intact.   ED Results / Procedures / Treatments    Labs (all labs ordered are listed, but only abnormal results are displayed) Labs Reviewed  BASIC METABOLIC PANEL - Abnormal; Notable for the following components:      Result Value   Sodium 133 (*)    Chloride 92 (*)    Glucose, Bld 129 (*)    BUN 38 (*)    Creatinine, Ser 7.54 (*)    Calcium 6.3 (*)    GFR, Estimated 5 (*)    All other components within normal limits  CBC - Abnormal; Notable for the following components:   Hemoglobin 11.4 (*)    HCT 34.3 (*)    All other components within normal limits  HEPATIC FUNCTION PANEL - Abnormal; Notable for the following components:   Alkaline Phosphatase 159 (*)    All other components within normal limits  SARS CORONAVIRUS 2 BY RT PCR  MAGNESIUM  PHOSPHORUS  URINALYSIS, ROUTINE W REFLEX MICROSCOPIC  CBG MONITORING, ED     EKG Normal sinus rhythm, ventricular rate 82.  PR 148, QRS 62, QTc 497.  No  acute ST elevations depressions.  No EKG evidence of acute ischemia or infarct.  QTc prolonged.  Nonspecific T wave changes.   RADIOLOGY Chest x-ray: No acute abnormality CT head: No acute abnormality   I also independently reviewed and agree with radiologist interpretations.   PROCEDURES:  Critical Care performed: No  .1-3 Lead EKG Interpretation  Performed by: Duffy Bruce, MD Authorized by: Duffy Bruce, MD     Interpretation: normal     ECG rate:  70-80s   ECG rate assessment: normal     Rhythm: sinus rhythm     Ectopy: none     Conduction: normal   Comments:     Indication: Weakness      MEDICATIONS ORDERED IN ED: Medications  calcium gluconate 1 g/ 50 mL sodium chloride IVPB (1,000 mg Intravenous New Bag/Given 11/09/21 1528)  calcium gluconate 1 g/ 50 mL sodium chloride IVPB (0 mg Intravenous Stopped 11/09/21 1349)     IMPRESSION / MDM / ASSESSMENT AND PLAN / ED COURSE  I reviewed the triage vital signs and the nursing notes.                               The patient is on the cardiac monitor  to evaluate for evidence of arrhythmia and/or significant heart rate changes.   Ddx:  Differential includes the following, with pertinent life- or limb-threatening emergencies considered:  Symptomatic hypocalcemia, hypo/percalcecmia or other lyte abnormality, orthostasis/dehydration, medication effect possibly gabapentin related  Patient's presentation is most consistent with acute presentation with potential threat to life or bodily function.  MDM:  71 year old female with history of end-stage renal disease here with generalized weakness, cramping.  Clinically, suspect electrolyte abnormality in the setting of end-stage renal disease.  Patient has profound hypocalcemia on BMP which appears to be new.  This fits with her muscle spasms, perioral anesthesia, and prolonged QTc.  Potassium and magnesium seem normal.  LFTs normal.  Chest x-ray is clear.  CT head is negative.  Patient will be started on IV calcium and admitted to medicine given her prolonged QT.  Dr. Candiss Norse with nephrology is aware and in agreement with this plan.  He recommends starting oral calcitriol and calcium replacement.   MEDICATIONS GIVEN IN ED: Medications  calcium gluconate 1 g/ 50 mL sodium chloride IVPB (1,000 mg Intravenous New Bag/Given 11/09/21 1528)  calcium gluconate 1 g/ 50 mL sodium chloride IVPB (0 mg Intravenous Stopped 11/09/21 1349)     Consults:  Dr. Candiss Norse with nephrology Hospitalist consulted for admission   EMR reviewed       FINAL CLINICAL IMPRESSION(S) / ED DIAGNOSES   Final diagnoses:  ESRD (end stage renal disease) ([place])  Hypocalcemia  Prolonged Q-T interval on ECG     Rx / DC Orders   ED Discharge Orders     None        Note:  This document was prepared using Dragon voice recognition software and may include unintentional dictation errors.   Duffy Bruce, MD 11/09/21 [phone]

## 2021-11-09 NOTE — Assessment & Plan Note (Signed)
-  IV hydralazine as needed

## 2021-11-10 DIAGNOSIS — N186 End stage renal disease: Secondary | ICD-10-CM | POA: Diagnosis not present

## 2021-11-10 DIAGNOSIS — Z992 Dependence on renal dialysis: Secondary | ICD-10-CM | POA: Diagnosis not present

## 2021-11-10 DIAGNOSIS — D631 Anemia in chronic kidney disease: Secondary | ICD-10-CM | POA: Diagnosis not present

## 2021-11-10 DIAGNOSIS — R42 Dizziness and giddiness: Secondary | ICD-10-CM | POA: Diagnosis not present

## 2021-11-10 DIAGNOSIS — N2581 Secondary hyperparathyroidism of renal origin: Secondary | ICD-10-CM | POA: Diagnosis not present

## 2021-11-10 LAB — BASIC METABOLIC PANEL
Anion gap: 16 — ABNORMAL HIGH (ref 5–15)
BUN: 50 mg/dL — ABNORMAL HIGH (ref 8–23)
CO2: 26 mmol/L (ref 22–32)
Calcium: 6.6 mg/dL — ABNORMAL LOW (ref 8.9–10.3)
Chloride: 94 mmol/L — ABNORMAL LOW (ref 98–111)
Creatinine, Ser: 9.24 mg/dL — ABNORMAL HIGH (ref 0.44–1.00)
GFR, Estimated: 4 mL/min — ABNORMAL LOW (ref >60)
Glucose, Bld: 105 mg/dL — ABNORMAL HIGH (ref 70–99)
Potassium: 4.5 mmol/L (ref 3.5–5.1)
Sodium: 136 mmol/L (ref 135–145)

## 2021-11-10 LAB — HEPATITIS B CORE ANTIBODY, TOTAL: Hep B Core Total Ab: NONREACTIVE

## 2021-11-10 LAB — URINALYSIS, ROUTINE W REFLEX MICROSCOPIC
Bilirubin Urine: NEGATIVE
Glucose, UA: NEGATIVE mg/dL
Hgb urine dipstick: NEGATIVE
Ketones, ur: NEGATIVE mg/dL
Leukocytes,Ua: NEGATIVE
Nitrite: NEGATIVE
Protein, ur: 100 mg/dL — AB
Specific Gravity, Urine: 1.01 (ref 1.005–1.030)
Squamous Epithelial / HPF: >50 — ABNORMAL HIGH (ref 0–5)
pH: 8 (ref 5.0–8.0)

## 2021-11-10 LAB — HEPATITIS B SURFACE ANTIGEN: Hepatitis B Surface Ag: NONREACTIVE

## 2021-11-10 LAB — HIV ANTIBODY (ROUTINE TESTING W REFLEX): HIV Screen 4th Generation wRfx: NONREACTIVE

## 2021-11-10 LAB — HEPATITIS B SURFACE ANTIBODY,QUALITATIVE: Hep B S Ab: REACTIVE — AB

## 2021-11-10 LAB — HEPATITIS C ANTIBODY: HCV Ab: REACTIVE — AB

## 2021-11-10 MED ORDER — CALCIUM GLUCONATE-NACL 1-0.675 GM/50ML-% IV SOLN
1.0000 g | Freq: Once | INTRAVENOUS | Status: AC
  Administered 2021-11-10: 1000 mg via INTRAVENOUS
  Filled 2021-11-10: qty 50

## 2021-11-10 MED ORDER — CALCITRIOL 0.25 MCG PO CAPS: 0.2500 ug | ORAL_CAPSULE | ORAL | 0 refills | Status: DC

## 2021-11-10 MED ORDER — CALCITRIOL 0.25 MCG PO CAPS
0.5000 ug | ORAL_CAPSULE | Freq: Every day | ORAL | Status: DC
  Administered 2021-11-10: 0.5 ug via ORAL
  Filled 2021-11-10: qty 2

## 2021-11-10 MED ORDER — CALCIUM CARBONATE 1250 (500 CA) MG PO TABS: 1.0000 | ORAL_TABLET | Freq: Every day | ORAL | 0 refills | Status: DC

## 2021-11-10 NOTE — Discharge Summary (Signed)
Physician Discharge Summary   Patient: Kimberly Frost MRN: [id number] DOB: 1950/09/13  Admit date:     11/09/2021  Discharge date: 11/10/21  Discharge Physician: Edwin Dada   PCP: Eugenia Pancoast, FNP     Recommendations at discharge:  Follow up with Dr. Francee Piccolo and re-evaluate Ca Dr. Posey Pronto: - Stop Rocaltrol and calcium carbonate if appropriate - Follow up PTH and vit D 1,25 as appropriate      Discharge Diagnoses: Principal Problem:   Symptomatic Hypocalcemia Active Problems:   ESRD (end stage renal disease) on dialysis ([place])   HTN (hypertension)   Hyperlipemia   Anemia in ESRD (end-stage renal disease) [facility])       Hospital Course: Kimberly Frost is a 71 y.o. F with ESRD on HD TThS, HTN, stage IB NSCLC of RUL and hep C who presented with weakness, cramps for several days, then dizziness, found in the ER to have Ca 6.6       * Hypocalcemia Cramps, weakness, dizziness Patient with symptomatic hypocalcemia.  She was given IV calcium, Rocaltrol and her cramping improved.  Worked with PT and appeared safe for discharge to home with [facility].    Discussed with nephrology who suspected this was due to outpatient Parsabiv infusions.    Recommend close follow up with nephrology, Dr. Posey Pronto  ESRD (end stage renal disease) on dialysis [facility])  HTN (hypertension)  Anemia in ESRD (end-stage renal disease) ([place]) Hemoglobin stable 11.4            The [place] was reviewed for this patient prior to discharge.   Consultants: Nephrology Procedures performed: MRI brain, normal  Disposition: Home health Diet recommendation:  Discharge Diet Orders (From admission, onward)     Start     Ordered   11/10/21 0000  Diet - low sodium heart healthy        11/10/21 1236             DISCHARGE MEDICATION: Allergies as of 11/10/2021   No Known Allergies      Medication List     TAKE these medications     acetaminophen 500 MG tablet Commonly known as: TYLENOL Take 1,000 mg by mouth every 6 (six) hours as needed for moderate pain or headache.   atorvastatin 40 MG tablet Commonly known as: LIPITOR Take 1 tablet (40 mg total) by mouth daily.   calcitRIOL 0.25 MCG capsule Commonly known as: ROCALTROL Take 1 capsule (0.25 mcg total) by mouth every Tuesday, Thursday, and Saturday at 6 PM. Start taking on: November 11, 2021   calcium carbonate 1250 (500 Ca) MG tablet Commonly known as: OS-CAL - dosed in mg of elemental calcium Take 1 tablet (1,250 mg total) by mouth daily with breakfast. Start taking on: November 11, 2021   gabapentin 100 MG capsule Commonly known as: NEURONTIN Take 100-200 mg by mouth at bedtime.   midodrine 10 MG tablet Commonly known as: PROAMATINE Take 5 mg by mouth See admin instructions. Take 1 tablet (5mg ) by mouth 30 minutes before dialysis and take 1 tablet (5mg ) by mouth during dialysis if needed for BP support   multivitamin Tabs tablet Take 1 tablet by mouth daily.   sucroferric oxyhydroxide 500 MG chewable tablet Commonly known as: VELPHORO Chew 500-1,000 mg by mouth See admin instructions. Take 2 tablets (1000mg ) by mouth three times daily with meals and take 1 tablet (500mg ) by mouth with snacks        Follow-up Information  Elmarie Shiley, MD Follow up.   Specialty: Nephrology Contact information: [address] [place] [address] [phone]                 Discharge Instructions     Diet - low sodium heart healthy   Complete by: As directed    Discharge instructions   Complete by: As directed    You were admitted for dizziness, cramps and weakness from low calcium levels We contacted Dr. Posey Pronto, and it turns out this is from the Parsabiv medication you were taking  Go see Dr. Posey Pronto in 1 week For the next week, take Os-Cal (calcium supplements) and calcitriol (Rocaltrol)  Take Os-Cal 1200 daily Take Rocaltrol 0.25 mcg three  times weekly on dialysis days  Ask Dr. Posey Pronto if you should continue  Resume all your other home medicines   Increase activity slowly   Complete by: As directed        Discharge Exam: Filed Weights   11/09/21 1004  Weight: 62 kg    General: Pt is alert, awake, not in acute distress Cardiovascular: RRR, nl S1-S2, no murmurs appreciated.   No LE edema.   Respiratory: Normal respiratory rate and rhythm.  CTAB without rales or wheezes. Abdominal: Abdomen soft and non-tender.  No distension or HSM.   Neuro/Psych: Strength symmetric in upper and lower extremities.  Judgment and insight appear normal.   Condition at discharge: good  The results of significant diagnostics from this hospitalization (including imaging, microbiology, ancillary and laboratory) are listed below for reference.   Imaging Studies: MR BRAIN WO CONTRAST  Result Date: 11/09/2021 CLINICAL DATA:  Initial evaluation for acute dizziness. EXAM: MRI HEAD WITHOUT CONTRAST TECHNIQUE: Multiplanar, multiecho pulse sequences of the brain and surrounding structures were obtained without intravenous contrast. COMPARISON:  Prior CT from earlier the same day. FINDINGS: Brain: Cerebral volume within normal limits. Patchy T2/FLAIR hyperintensity involving the periventricular deep white matter both cerebral hemispheres, most consistent with chronic small vessel ischemic disease, moderate in nature. Few small remote lacunar infarcts present about the left basal ganglia and thalami. Small remote right cerebellar infarct. No abnormal foci of restricted diffusion to suggest acute or subacute ischemia. Gray-white matter differentiation maintained. No acute intracranial hemorrhage. Few chronic micro hemorrhages noted, most likely hypertensive in nature. No mass lesion, midline shift or mass effect. No hydrocephalus or extra-axial fluid collection. Pituitary gland and suprasellar region within normal limits. Vascular: Major intracranial vascular  flow voids are maintained. Skull and upper cervical spine: Craniocervical junction normal. Bone marrow signal intensity normal. No scalp soft tissue abnormality. Sinuses/Orbits: Globes orbital soft tissues within normal limits. Paranasal sinuses are clear. Trace left mastoid effusion, of doubtful significance. Other: None. IMPRESSION: 1. No acute intracranial abnormality. 2. Moderate chronic microvascular ischemic disease with a few small remote lacunar infarcts about the left basal ganglia, thalami, and right cerebellum. Electronically Signed   By: Jeannine Boga M.D.   On: 11/09/2021 19:15   CT HEAD WO CONTRAST (5MM)  Result Date: 11/09/2021 CLINICAL DATA:  71 year old female with weakness and dizziness. EXAM: CT HEAD WITHOUT CONTRAST TECHNIQUE: Contiguous axial images were obtained from the base of the skull through the vertex without intravenous contrast. RADIATION DOSE REDUCTION: This exam was performed according to the departmental dose-optimization program which includes automated exposure control, adjustment of the mA and/or kV according to patient size and/or use of iterative reconstruction technique. COMPARISON:  None Available. FINDINGS: Brain: No evidence of acute infarction, hemorrhage, hydrocephalus, extra-axial collection or mass lesion/mass  effect. Atrophy noted. Mild-to-moderate white matter hypodensities probably represents chronic small-vessel white matter ischemic changes. A small remote RIGHT cerebellar infarct is present. Vascular: Carotid and vertebral atherosclerotic calcifications are noted. Skull: Normal. Negative for fracture or focal lesion. Sinuses/Orbits: No acute finding. Other: None. IMPRESSION: 1. No evidence of acute intracranial abnormality. 2. Atrophy and probable chronic small-vessel white matter ischemic changes. Small remote RIGHT cerebellar infarct. Electronically Signed   By: Margarette Canada M.D.   On: 11/09/2021 13:33   DG Chest 2 View  Result Date:  11/09/2021 CLINICAL DATA:  Weakness and dizziness. Dialysis patient. History of right upper lobe carcinoma. EXAM: CHEST - 2 VIEW COMPARISON:  CT, 02/08/2021.  Radiographs, 09/04/2020. FINDINGS: Right upper lobe opacity is similar to the prior exams, consistent with scarring. Cannot exclude viable underlying neoplasm. Remainder of the lungs is clear. No pleural effusion or pneumothorax. Cardiac silhouette is normal in size. No mediastinal or hilar masses. No convincing adenopathy. Skeletal structures are intact. IMPRESSION: 1. No acute cardiopulmonary disease. Electronically Signed   By: Lajean Manes M.D.   On: 11/09/2021 13:14    Microbiology: Results for orders placed or performed during the hospital encounter of 11/09/21  SARS Coronavirus 2 by RT PCR (hospital order, performed in [facility] hospital lab) *cepheid single result test* Anterior Nasal Swab     Status: None   Collection Time: 11/09/21 12:32 PM   Specimen: Anterior Nasal Swab  Result Value Ref Range Status   SARS Coronavirus 2 by RT PCR NEGATIVE NEGATIVE Final    Comment: (NOTE) SARS-CoV-2 target nucleic acids are NOT DETECTED.  The SARS-CoV-2 RNA is generally detectable in upper and lower respiratory specimens during the acute phase of infection. The lowest concentration of SARS-CoV-2 viral copies this assay can detect is 250 copies / mL. A negative result does not preclude SARS-CoV-2 infection and should not be used as the sole basis for treatment or other patient management decisions.  A negative result may occur with improper specimen collection / handling, submission of specimen other than nasopharyngeal swab, presence of viral mutation(s) within the areas targeted by this assay, and inadequate number of viral copies (<250 copies / mL). A negative result must be combined with clinical observations, patient history, and epidemiological information.  Fact Sheet for Patients:    https://www.patel.info/  Fact Sheet for Healthcare Providers: https://hall.com/  This test is not yet approved or  cleared by the [place] FDA and has been authorized for detection and/or diagnosis of SARS-CoV-2 by FDA under an Emergency Use Authorization (EUA).  This EUA will remain in effect (meaning this test can be used) for the duration of the COVID-19 declaration under Section 564(b)(1) of the Act, 21 U.S.C. section 360bbb-3(b)(1), unless the authorization is terminated or revoked sooner.  Performed at [facility], [address] [place], [place] [address]     Labs: CBC: Recent Labs  Lab 11/09/21 1006  WBC 6.7  HGB 11.4*  HCT 34.3*  MCV 84.9  PLT 191   Basic Metabolic Panel: Recent Labs  Lab 11/09/21 1006 11/10/21 0454  NA 133* 136  K 4.3 4.5  CL 92* 94*  CO2 27 26  GLUCOSE 129* 105*  BUN 38* 50*  CREATININE 7.54* 9.24*  CALCIUM 6.3* 6.6*  MG 2.4  --   PHOS 2.8  --    Liver Function Tests: Recent Labs  Lab 11/09/21 1006  AST 40  ALT 28  ALKPHOS 159*  BILITOT 0.9  PROT 7.9  ALBUMIN 4.2  CBG: No results for input(s): "GLUCAP" in the last 168 hours.  Discharge time spent: approximately 35 minutes spent on discharge counseling, evaluation of patient on day of discharge, and coordination of discharge planning with nursing, social work, pharmacy and case management  Signed: Edwin Dada, MD [facility] 11/10/2021

## 2021-11-10 NOTE — Consult Note (Signed)
[facility]  CONSULT NOTE    Date: 11/10/2021                  Patient Name:  Kimberly Frost  MRN: [id number]  DOB: 11/01/1950  Age / Sex: 71 y.o., female         PCP: Eugenia Pancoast, FNP                 Service Requesting Consult: [place]                 Reason for Consult: Hypocalcemia            History of Present Illness: Kimberly Frost is a 71 y.o.  female with past medical history including hypertension, hyperlipidemia, anemia, HCV, and end-stage renal disease on hemodialysis, who was admitted to [facility] on 11/09/2021 for Hypocalcemia [E83.51]  Patient currently receives outpatient dialysis treatments at [facility] on a TTS schedule, supervised by [facility]) physicians.  Patient states she received a full dialysis treatment on Saturday.  Began having generalized weakness late last week, Thursday or Friday.  Progressed to dizziness and blurry vision over the weekend.  Patient states she was recently started on gabapentin.  Denies nausea and vomiting.  Denies shortness of breath or cough.  Denies chest or abdominal pain.  States she felt unwell last week and missed 1 dialysis treatment however received 2 treatments in a row to make up for it.  Labs on ED arrival include sodium 133, glucose 129 BUN 38, creatinine seven-point with GFR 5, calcium 6.3, and hemoglobin 11.4.  Respiratory panel negative for COVID-19.  Urinalysis negative.  Chest x-ray negative.  CT head negative for acute changes.  Brain MRI shows moderate chronic microvascular ischemic disease with small remote infarcts at the left basal ganglia thalami, and right cerebellum.   Medications: Outpatient medications: (Not in a hospital admission)   Current medications: Current Facility-Administered Medications  Medication Dose Route Frequency Provider Last Rate Last Admin   acetaminophen (TYLENOL) tablet 650 mg  650 mg Oral Q6H PRN Ivor Costa, MD       atorvastatin  (LIPITOR) tablet 40 mg  40 mg Oral QPM Ivor Costa, MD       calcitRIOL (ROCALTROL) capsule 0.5 mcg  0.5 mcg Oral Daily Edwin Dada, MD   0.5 mcg at 11/10/21 1252   calcium carbonate (OS-CAL - dosed in mg of elemental calcium) tablet 1,250 mg  1 tablet Oral Q breakfast Ivor Costa, MD   1,250 mg at 11/10/21 4742   gabapentin (NEURONTIN) capsule 200 mg  200 mg Oral QHS Ivor Costa, MD       heparin injection 5,000 Units  5,000 Units Subcutaneous Q8H Ivor Costa, MD   5,000 Units at 11/10/21 5956   hydrALAZINE (APRESOLINE) injection 5 mg  5 mg Intravenous Q2H PRN Ivor Costa, MD       multivitamin (RENA-VIT) tablet 1 tablet  1 tablet Oral Daily Ivor Costa, MD   1 tablet at 11/10/21 0951   ondansetron (ZOFRAN) injection 4 mg  4 mg Intravenous Q8H PRN Ivor Costa, MD       sucroferric oxyhydroxide [facility]) chewable tablet 1,000 mg  1,000 mg Oral TID WC BelueAlver Sorrow, RPH   1,000 mg at 11/10/21 1252   And   sucroferric oxyhydroxide (VELPHORO) chewable tablet 500 mg  500 mg Oral PRN Renda Rolls, RPH       zolpidem (AMBIEN) tablet 5 mg  5  mg Oral QHS PRN Ivor Costa, MD   5 mg at 11/10/21 0145   Current Outpatient Medications  Medication Sig Dispense Refill   acetaminophen (TYLENOL) 500 MG tablet Take 1,000 mg by mouth every 6 (six) hours as needed for moderate pain or headache.     atorvastatin (LIPITOR) 40 MG tablet Take 1 tablet (40 mg total) by mouth daily. 90 tablet 3   gabapentin (NEURONTIN) 100 MG capsule Take 100-200 mg by mouth at bedtime.     midodrine (PROAMATINE) 10 MG tablet Take 5 mg by mouth See admin instructions. Take 1 tablet (5mg ) by mouth 30 minutes before dialysis and take 1 tablet (5mg ) by mouth during dialysis if needed for BP support     multivitamin (RENA-VIT) TABS tablet Take 1 tablet by mouth daily.     sucroferric oxyhydroxide (VELPHORO) 500 MG chewable tablet Chew 500-1,000 mg by mouth See admin instructions. Take 2 tablets (1000mg ) by mouth three times daily  with meals and take 1 tablet (500mg ) by mouth with snacks     [Frost ON 11/11/2021] calcitRIOL (ROCALTROL) 0.25 MCG capsule Take 1 capsule (0.25 mcg total) by mouth every Tuesday, Thursday, and Saturday at 6 PM. 15 capsule 0   [Frost ON 11/11/2021] calcium carbonate (OS-CAL - DOSED IN MG OF ELEMENTAL CALCIUM) 1250 (500 Ca) MG tablet Take 1 tablet (1,250 mg total) by mouth daily with breakfast. 30 tablet 0      Allergies: No Known Allergies    Past Medical History: Past Medical History:  Diagnosis Date   Chronic kidney disease    Edema    RLE   Hepatitis    Hep C   History of blood transfusion    History of radiation therapy    right lung 02/24/2021-03/10/2021  Dr Gery Pray   Hypertension    Lung cancer [facility])    Wears dentures      Past Surgical History: Past Surgical History:  Procedure Laterality Date   A/V FISTULAGRAM Left 04/11/2019   Procedure: A/V FISTULAGRAM;  Surgeon: Katha Cabal, MD;  Location: [place] [facility];  Service: Cardiovascular;  Laterality: Left;   A/V FISTULAGRAM Left 05/01/2019   Procedure: A/V FISTULAGRAM;  Surgeon: Algernon Huxley, MD;  Location: [place] [facility];  Service: Cardiovascular;  Laterality: Left;   A/V FISTULAGRAM Left 01/15/2020   Procedure: A/V FISTULAGRAM;  Surgeon: Algernon Huxley, MD;  Location: [place] [facility];  Service: Cardiovascular;  Laterality: Left;   A/V FISTULAGRAM Left 04/22/2020   Procedure: A/V FISTULAGRAM;  Surgeon: Algernon Huxley, MD;  Location: [place] [facility];  Service: Cardiovascular;  Laterality: Left;   A/V FISTULAGRAM Left 06/25/2021   Procedure: A/V Fistulagram;  Surgeon: Katha Cabal, MD;  Location: [place] [facility];  Service: Cardiovascular;  Laterality: Left;   AV FISTULA PLACEMENT Left 06/16/2018   Procedure: Left Arm ARTERIOVENOUS (AV) FISTULA CREATION;  Surgeon: Marty Heck, MD;  Location: [facility];  Service: Vascular;  Laterality: Left;   MULTIPLE TOOTH EXTRACTIONS     MYOMECTOMY        Family History: Family History  Problem Relation Age of Onset   Diabetes Mother    Hypertension Mother    Breast cancer Mother 52   Bone cancer Father    Stroke Brother    Diabetes Brother    Drug abuse Brother    Breast cancer Maternal Aunt 50   Breast cancer Maternal Aunt 70   Kidney cancer Maternal Grandmother  Social History: Social History   Socioeconomic History   Marital status: Significant Other    Spouse name: Not on file   Number of children: 0   Years of education: Not on file   Highest education level: Not on file  Occupational History   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   Tobacco Use   Smoking status: Former    Types: Cigarettes    Quit date: 1990    Years since quitting: 33.7   Smokeless tobacco: Never  Vaping Use   Vaping Use: Never used  Substance and Sexual Activity   Alcohol use: Not Currently    Alcohol/week: 6.0 standard drinks of alcohol    Types: 6 Cans of beer per week    Comment: quit 3-4 years ago   Drug use: Not Currently    Types: Marijuana    Comment: Years ago   Sexual activity: Not Currently  Other Topics Concern   Not on file  Social History Narrative   Lives at home in private residence with fiance' Joe    Social Determinants of Health   Financial Resource Strain: Not on file  Food Insecurity: Not on file  Transportation Needs: Not on file  Physical Activity: Not on file  Stress: Not on file  Social Connections: Not on file  Intimate Partner Violence: Not on file     Review of Systems: Review of Systems  Constitutional:  Negative for chills, fever and malaise/fatigue.  HENT:  Negative for congestion, sore throat and tinnitus.   Eyes:  Negative for blurred vision and redness.  Respiratory:  Negative for cough, shortness of breath and wheezing.   Cardiovascular:  Negative for chest pain, palpitations, claudication and leg swelling.  Gastrointestinal:  Negative for abdominal pain, blood in stool, diarrhea,  nausea and vomiting.  Genitourinary:  Negative for flank pain, frequency and hematuria.  Musculoskeletal:  Negative for back pain, falls and myalgias.  Skin:  Negative for rash.  Neurological:  Positive for dizziness, tremors and weakness. Negative for headaches.  Endo/Heme/Allergies:  Does not bruise/bleed easily.  Psychiatric/Behavioral:  Negative for depression. The patient is not nervous/anxious and does not have insomnia.     Vital Signs: Blood pressure 136/74, pulse 84, temperature 98 F (36.7 C), resp. rate 16, height 5\' 6"  (1.676 m), weight 62 kg, SpO2 99 %.  Weight trends: Filed Weights   11/09/21 1004  Weight: 62 kg    Physical Exam: General: NAD  Head: Normocephalic, atraumatic. Moist oral mucosal membranes  Eyes: Anicteric  Lungs:  Clear to auscultation, normal effort, room air  Heart: Regular rate and rhythm  Abdomen:  Soft, nontender  Extremities: Trace peripheral edema.  Neurologic: Nonfocal, moving all four extremities  Skin: No lesions  Access: Right AVF     Lab results: Basic Metabolic Panel: Recent Labs  Lab 11/09/21 1006 11/10/21 0454  NA 133* 136  K 4.3 4.5  CL 92* 94*  CO2 27 26  GLUCOSE 129* 105*  BUN 38* 50*  CREATININE 7.54* 9.24*  CALCIUM 6.3* 6.6*  MG 2.4  --   PHOS 2.8  --     Liver Function Tests: Recent Labs  Lab 11/09/21 1006  AST 40  ALT 28  ALKPHOS 159*  BILITOT 0.9  PROT 7.9  ALBUMIN 4.2   No results for input(s): "LIPASE", "AMYLASE" in the last 168 hours. No results for input(s): "AMMONIA" in the last 168 hours.  CBC: Recent Labs  Lab 11/09/21 1006  WBC 6.7  HGB 11.4*  HCT 34.3*  MCV 84.9  PLT 258    Cardiac Enzymes: No results for input(s): "CKTOTAL", "CKMB", "CKMBINDEX", "TROPONINI" in the last 168 hours.  BNP: Invalid input(s): "POCBNP"  CBG: No results for input(s): "GLUCAP" in the last 168 hours.  Microbiology: Results for orders placed or performed during the hospital encounter of 11/09/21   SARS Coronavirus 2 by RT PCR (hospital order, performed in [facility] hospital lab) *cepheid single result test* Anterior Nasal Swab     Status: None   Collection Time: 11/09/21 12:32 PM   Specimen: Anterior Nasal Swab  Result Value Ref Range Status   SARS Coronavirus 2 by RT PCR NEGATIVE NEGATIVE Final    Comment: (NOTE) SARS-CoV-2 target nucleic acids are NOT DETECTED.  The SARS-CoV-2 RNA is generally detectable in upper and lower respiratory specimens during the acute phase of infection. The lowest concentration of SARS-CoV-2 viral copies this assay can detect is 250 copies / mL. A negative result does not preclude SARS-CoV-2 infection and should not be used as the sole basis for treatment or other patient management decisions.  A negative result may occur with improper specimen collection / handling, submission of specimen other than nasopharyngeal swab, presence of viral mutation(s) within the areas targeted by this assay, and inadequate number of viral copies (<250 copies / mL). A negative result must be combined with clinical observations, patient history, and epidemiological information.  Fact Sheet for Patients:   https://www.patel.info/  Fact Sheet for Healthcare Providers: https://hall.com/  This test is not yet approved or  cleared by the [place] FDA and has been authorized for detection and/or diagnosis of SARS-CoV-2 by FDA under an Emergency Use Authorization (EUA).  This EUA will remain in effect (meaning this test can be used) for the duration of the COVID-19 declaration under Section 564(b)(1) of the Act, 21 U.S.C. section 360bbb-3(b)(1), unless the authorization is terminated or revoked sooner.  Performed at [facility], [address] [place], [place] [address]     Coagulation Studies: No results for input(s): "LABPROT", "INR" in the last 72 hours.  Urinalysis: Recent Labs    11/10/21 0454   COLORURINE YELLOW*  LABSPEC 1.010  PHURINE 8.0  GLUCOSEU NEGATIVE  HGBUR NEGATIVE  BILIRUBINUR NEGATIVE  KETONESUR NEGATIVE  PROTEINUR 100*  NITRITE NEGATIVE  LEUKOCYTESUR NEGATIVE      Imaging: MR BRAIN WO CONTRAST  Result Date: 11/09/2021 CLINICAL DATA:  Initial evaluation for acute dizziness. EXAM: MRI HEAD WITHOUT CONTRAST TECHNIQUE: Multiplanar, multiecho pulse sequences of the brain and surrounding structures were obtained without intravenous contrast. COMPARISON:  Prior CT from earlier the same day. FINDINGS: Brain: Cerebral volume within normal limits. Patchy T2/FLAIR hyperintensity involving the periventricular deep white matter both cerebral hemispheres, most consistent with chronic small vessel ischemic disease, moderate in nature. Few small remote lacunar infarcts present about the left basal ganglia and thalami. Small remote right cerebellar infarct. No abnormal foci of restricted diffusion to suggest acute or subacute ischemia. Gray-white matter differentiation maintained. No acute intracranial hemorrhage. Few chronic micro hemorrhages noted, most likely hypertensive in nature. No mass lesion, midline shift or mass effect. No hydrocephalus or extra-axial fluid collection. Pituitary gland and suprasellar region within normal limits. Vascular: Major intracranial vascular flow voids are maintained. Skull and upper cervical spine: Craniocervical junction normal. Bone marrow signal intensity normal. No scalp soft tissue abnormality. Sinuses/Orbits: Globes orbital soft tissues within normal limits. Paranasal sinuses are clear. Trace left mastoid effusion, of doubtful significance. Other: None. IMPRESSION: 1. No acute  intracranial abnormality. 2. Moderate chronic microvascular ischemic disease with a few small remote lacunar infarcts about the left basal ganglia, thalami, and right cerebellum. Electronically Signed   By: Jeannine Boga M.D.   On: 11/09/2021 19:15   CT HEAD WO  CONTRAST (5MM)  Result Date: 11/09/2021 CLINICAL DATA:  71 year old female with weakness and dizziness. EXAM: CT HEAD WITHOUT CONTRAST TECHNIQUE: Contiguous axial images were obtained from the base of the skull through the vertex without intravenous contrast. RADIATION DOSE REDUCTION: This exam was performed according to the departmental dose-optimization program which includes automated exposure control, adjustment of the mA and/or kV according to patient size and/or use of iterative reconstruction technique. COMPARISON:  None Available. FINDINGS: Brain: No evidence of acute infarction, hemorrhage, hydrocephalus, extra-axial collection or mass lesion/mass effect. Atrophy noted. Mild-to-moderate white matter hypodensities probably represents chronic small-vessel white matter ischemic changes. A small remote RIGHT cerebellar infarct is present. Vascular: Carotid and vertebral atherosclerotic calcifications are noted. Skull: Normal. Negative for fracture or focal lesion. Sinuses/Orbits: No acute finding. Other: None. IMPRESSION: 1. No evidence of acute intracranial abnormality. 2. Atrophy and probable chronic small-vessel white matter ischemic changes. Small remote RIGHT cerebellar infarct. Electronically Signed   By: Margarette Canada M.D.   On: 11/09/2021 13:33   DG Chest 2 View  Result Date: 11/09/2021 CLINICAL DATA:  Weakness and dizziness. Dialysis patient. History of right upper lobe carcinoma. EXAM: CHEST - 2 VIEW COMPARISON:  CT, 02/08/2021.  Radiographs, 09/04/2020. FINDINGS: Right upper lobe opacity is similar to the prior exams, consistent with scarring. Cannot exclude viable underlying neoplasm. Remainder of the lungs is clear. No pleural effusion or pneumothorax. Cardiac silhouette is normal in size. No mediastinal or hilar masses. No convincing adenopathy. Skeletal structures are intact. IMPRESSION: 1. No acute cardiopulmonary disease. Electronically Signed   By: Lajean Manes M.D.   On: 11/09/2021 13:14      Assessment & Plan: Kimberly Frost is a 71 y.o.  female with past medical history including hypertension, hyperlipidemia, anemia, HCV, and end-stage renal disease on hemodialysis, who was admitted to [facility] on 11/09/2021 for Hypocalcemia [E83.51]   Hypocalcemia with end-stage renal disease on hemodialysis.  Calcium on ED arrival 6.3.  Suggestive symptoms of dizziness, tremors and weakness.  Symptoms could be related to gabapentin, but likely due to postdialysis medication Parsabiv.  Hermina Staggers has known side effect of hypocalcemia.  We have discussed our concern with outpatient nephrologist and have suggested holding Parsabiv with treatment.  Agree with calcium supplementation IV and oral.  Next dialysis treatment scheduled for Tuesday, outpatient.  Patient will follow-up at outpatient clinic for labs to evaluate calcium.  2. Anemia of chronic kidney disease Lab Results  Component Value Date   HGB 11.4 (L) 11/09/2021  Hemoglobin within desired range.  3. Secondary Hyperparathyroidism: with outpatient labs: PTH 1508, phosphorus 4.2, calcium 9.9 on 09/09/21.   Lab Results  Component Value Date   PTH 525 06/23/2018   CALCIUM 6.6 (L) 11/10/2021   CAION 1.09 (L) 01/14/2010   PHOS 2.8 11/09/2021    Hypocalcemic on admission.  We will hold Parsabiv outpatient and patient will follow-up with nephrology. Received IV and oral calcium supplementation.    LOS: 0 Saket Hellstrom 10/9/20233:29 PM

## 2021-11-10 NOTE — Hospital Course (Signed)
Mrs. Kimberly Frost is a 71 y.o. F with ESRD on HD TThS, HTN, stage IB NSCLC of RUL and hep C who presented with weakness, cramps for several days, then dizziness, found in the ER to have Ca 6.6  10/8: Admitted, given Ca supplements, Nephrology consulted

## 2021-11-10 NOTE — Progress Notes (Signed)
OT Cancellation Note  Patient Details Name: Kimberly Frost MRN: [id number] DOB: 1950-07-11   Cancelled Treatment:    Reason Eval/Treat Not Completed: Patient declined, no reason specified. Per PT, Patient declines OT evaluation this admission. OT to complete order at this time.   Darleen Crocker, [place], OTR/L , CBIS ascom [phone]  11/10/21, 9:41 AM

## 2021-11-10 NOTE — ED Notes (Signed)
Pt able to ambulate to the restroom and back with minimal assistance, the pt's gait is unstable and the pt states feeling dizzy from changing positions after standing.

## 2021-11-10 NOTE — Evaluation (Signed)
Physical Therapy Evaluation Patient Details Name: Kimberly Frost MRN: [id number] DOB: 1951-01-19 Today's Date: 11/10/2021  History of Present Illness  Pt is a 71 y.o. female with medical history significant of ESRD-HD (TTS), HTN, HLD, NSCLC (s/p of XRT), HCV, anemia, who presents with muscle cramping and dizziness.  MD assessment includes: Hypocalcemia, dizziness, and anemia in ESRD.   Clinical Impression  Pt was pleasant and motivated to participate during the session and put forth good effort throughout. Pt required no physical assistance during the session but did require general sequencing for safe use of the RW.  Pt ultimately was able to amb 200 feet with slow cadence but was steady without LOB.  Pt declined assessment of ambulation without an AD which is her baseline secondary to feeling weaker than baseline and feeling safer with the RW at this time.  Pt's SpO2 and HR were both WNL during the session with pt's only adverse symptom being mild dizziness with ambulation.  Pt returned to sitting with BP 127/69, nursing notified.  Pt will benefit from HHPT upon discharge to safely address deficits listed in patient problem list for decreased caregiver assistance and eventual return to PLOF.          Recommendations for follow up therapy are one component of a multi-disciplinary discharge planning process, led by the attending physician.  Recommendations may be updated based on patient status, additional functional criteria and insurance authorization.  Follow Up Recommendations Home health PT      Assistance Recommended at Discharge Intermittent Supervision/Assistance  Patient can return home with the following  A little help with walking and/or transfers;Help with stairs or ramp for entrance;Assist for transportation    Equipment Recommendations None recommended by PT  Recommendations for Other Services       Functional Status Assessment Patient has had a recent decline in their  functional status and demonstrates the ability to make significant improvements in function in a reasonable and predictable amount of time.     Precautions / Restrictions Precautions Precautions: Fall Restrictions Weight Bearing Restrictions: No Other Position/Activity Restrictions: LUE HD fistula      Mobility  Bed Mobility Overal bed mobility: Independent                  Transfers Overall transfer level: Modified independent Equipment used: Rolling walker (2 wheels)               General transfer comment: Good control and stability    Ambulation/Gait Ambulation/Gait assistance: Supervision Gait Distance (Feet): 200 Feet Assistive device: Rolling walker (2 wheels) Gait Pattern/deviations: Step-through pattern, Decreased step length - right, Decreased step length - left Gait velocity: decreased     General Gait Details: Mildly decreased cadence with pt self-selecting to use the RW and declining attempt without an AD secondary to weakness; steady without LOB; min verbal cues for amb closer to the RW with upright posture  Stairs            Wheelchair Mobility    Modified Rankin (Stroke Patients Only)       Balance Overall balance assessment: Needs assistance   Sitting balance-Leahy Scale: Normal     Standing balance support: Bilateral upper extremity supported, During functional activity Standing balance-Leahy Scale: Good                               Pertinent Vitals/Pain Pain Assessment Pain Assessment: No/denies pain    Home  Living Family/patient expects to be discharged to:: Private residence Living Arrangements: Spouse/significant other Available Help at Discharge: Family;Available 24 hours/day Type of Home: House Home Access: Stairs to enter Entrance Stairs-Rails: Right;Can reach Software engineer of Steps: 2   Home Layout: One level Home Equipment: Cane - single point      Prior Function Prior  Level of Function : Independent/Modified Independent             Mobility Comments: Ind amb community distances without an AD, no fall history ADLs Comments: Ind with ADLs     Hand Dominance        Extremity/Trunk Assessment   Upper Extremity Assessment Upper Extremity Assessment: Overall WFL for tasks assessed    Lower Extremity Assessment Lower Extremity Assessment: Overall WFL for tasks assessed       Communication   Communication: No difficulties  Cognition Arousal/Alertness: Awake/alert Behavior During Therapy: WFL for tasks assessed/performed Overall Cognitive Status: Within Functional Limits for tasks assessed                                          General Comments      Exercises     Assessment/Plan    PT Assessment Patient needs continued PT services  PT Problem List Decreased balance;Decreased activity tolerance;Decreased knowledge of use of DME       PT Treatment Interventions DME instruction;Gait training;Stair training;Functional mobility training;Therapeutic activities;Therapeutic exercise;Balance training;Patient/family education    PT Goals (Current goals can be found in the Care Plan section)  Acute Rehab PT Goals Patient Stated Goal: To return home PT Goal Formulation: With patient Time For Goal Achievement: 11/23/21 Potential to Achieve Goals: Good    Frequency Min 2X/week     Co-evaluation               AM-PAC PT "6 Clicks" Mobility  Outcome Measure Help needed turning from your back to your side while in a flat bed without using bedrails?: None Help needed moving from lying on your back to sitting on the side of a flat bed without using bedrails?: None Help needed moving to and from a bed to a chair (including a wheelchair)?: None Help needed standing up from a chair using your arms (e.g., wheelchair or bedside chair)?: None Help needed to walk in hospital room?: A Little Help needed climbing 3-5 steps  with a railing? : A Little 6 Click Score: 22    End of Session Equipment Utilized During Treatment: Gait belt Activity Tolerance: Patient tolerated treatment well Patient left: in bed;with call bell/phone within reach;with bed alarm set Nurse Communication: Mobility status PT Visit Diagnosis: Difficulty in walking, not elsewhere classified (R26.2)    Time: 7408-1448 PT Time Calculation (min) (ACUTE ONLY): 27 min   Charges:   PT Evaluation $PT Eval Moderate Complexity: 1 Mod PT Treatments $Gait Training: 8-22 mins       D. Scott Valentino Saavedra PT, DPT 11/10/21, 11:02 AM

## 2021-11-11 DIAGNOSIS — D631 Anemia in chronic kidney disease: Secondary | ICD-10-CM | POA: Diagnosis not present

## 2021-11-11 DIAGNOSIS — N2581 Secondary hyperparathyroidism of renal origin: Secondary | ICD-10-CM | POA: Diagnosis not present

## 2021-11-11 DIAGNOSIS — N186 End stage renal disease: Secondary | ICD-10-CM | POA: Diagnosis not present

## 2021-11-11 DIAGNOSIS — D509 Iron deficiency anemia, unspecified: Secondary | ICD-10-CM | POA: Diagnosis not present

## 2021-11-11 DIAGNOSIS — Z992 Dependence on renal dialysis: Secondary | ICD-10-CM | POA: Diagnosis not present

## 2021-11-11 DIAGNOSIS — D689 Coagulation defect, unspecified: Secondary | ICD-10-CM | POA: Diagnosis not present

## 2021-11-11 DIAGNOSIS — D508 Other iron deficiency anemias: Secondary | ICD-10-CM | POA: Diagnosis not present

## 2021-11-11 LAB — CALCITRIOL (1,25 DI-OH VIT D): Vit D, 1,25-Dihydroxy: 7 pg/mL — ABNORMAL LOW (ref 24.8–81.5)

## 2021-11-11 LAB — PARATHYROID HORMONE, INTACT (NO CA): PTH: 1766 pg/mL — ABNORMAL HIGH (ref 15–65)

## 2021-11-11 LAB — HEPATITIS B SURFACE ANTIBODY, QUANTITATIVE: Hep B S AB Quant (Post): 116.9 m[IU]/mL (ref >9.9)

## 2021-11-14 ENCOUNTER — Telehealth: Payer: Self-pay | Admitting: Emergency Medicine

## 2021-11-14 DIAGNOSIS — D508 Other iron deficiency anemias: Secondary | ICD-10-CM | POA: Diagnosis not present

## 2021-11-14 DIAGNOSIS — N186 End stage renal disease: Secondary | ICD-10-CM | POA: Diagnosis not present

## 2021-11-14 DIAGNOSIS — N2581 Secondary hyperparathyroidism of renal origin: Secondary | ICD-10-CM | POA: Diagnosis not present

## 2021-11-14 DIAGNOSIS — D509 Iron deficiency anemia, unspecified: Secondary | ICD-10-CM | POA: Diagnosis not present

## 2021-11-14 DIAGNOSIS — D689 Coagulation defect, unspecified: Secondary | ICD-10-CM | POA: Diagnosis not present

## 2021-11-14 DIAGNOSIS — Z992 Dependence on renal dialysis: Secondary | ICD-10-CM | POA: Diagnosis not present

## 2021-11-14 DIAGNOSIS — D631 Anemia in chronic kidney disease: Secondary | ICD-10-CM | POA: Diagnosis not present

## 2021-11-18 DIAGNOSIS — N186 End stage renal disease: Secondary | ICD-10-CM | POA: Diagnosis not present

## 2021-11-18 DIAGNOSIS — D689 Coagulation defect, unspecified: Secondary | ICD-10-CM | POA: Diagnosis not present

## 2021-11-18 DIAGNOSIS — D508 Other iron deficiency anemias: Secondary | ICD-10-CM | POA: Diagnosis not present

## 2021-11-18 DIAGNOSIS — D509 Iron deficiency anemia, unspecified: Secondary | ICD-10-CM | POA: Diagnosis not present

## 2021-11-18 DIAGNOSIS — D631 Anemia in chronic kidney disease: Secondary | ICD-10-CM | POA: Diagnosis not present

## 2021-11-18 DIAGNOSIS — N2581 Secondary hyperparathyroidism of renal origin: Secondary | ICD-10-CM | POA: Diagnosis not present

## 2021-11-18 DIAGNOSIS — Z992 Dependence on renal dialysis: Secondary | ICD-10-CM | POA: Diagnosis not present

## 2021-11-19 ENCOUNTER — Ambulatory Visit (INDEPENDENT_AMBULATORY_CARE_PROVIDER_SITE_OTHER): Payer: Medicare Other

## 2021-11-19 DIAGNOSIS — Z0000 Encounter for general adult medical examination without abnormal findings: Secondary | ICD-10-CM | POA: Diagnosis not present

## 2021-11-19 NOTE — Progress Notes (Signed)
I connected with  Kimberly Frost on 11/19/21 by a audio enabled telemedicine application and verified that I am speaking with the correct person using two identifiers.  Patient Location: Home  Provider Location: Home Office  I discussed the limitations of evaluation and management by telemedicine. The patient expressed understanding and agreed to proceed.   Subjective:   Kimberly Frost is a 71 y.o. female who presents for Medicare Annual (Subsequent) preventive examination.  Review of Systems     Cardiac Risk Factors include: advanced age (>17men, >26 women)     Objective:    There were no vitals filed for this visit. There is no height or weight on file to calculate BMI.     11/19/2021    8:37 AM 11/09/2021    3:54 PM 07/24/2021   10:23 AM 07/14/2021   11:54 AM 06/25/2021    2:50 PM 04/07/2021   10:53 AM 02/10/2021   12:26 PM  Advanced Directives  Does Patient Have a Medical Advance Directive? No No No No No No Yes  Does patient want to make changes to medical advance directive?       No - Patient declined  Would patient like information on creating a medical advance directive? No - Patient declined No - Patient declined No - Patient declined No - Patient declined No - Patient declined No - Patient declined     Current Medications (verified) Outpatient Encounter Medications as of 11/19/2021  Medication Sig   acetaminophen (TYLENOL) 500 MG tablet Take 1,000 mg by mouth every 6 (six) hours as needed for moderate pain or headache.   atorvastatin (LIPITOR) 40 MG tablet Take 1 tablet (40 mg total) by mouth daily.   calcitRIOL (ROCALTROL) 0.25 MCG capsule Take 1 capsule (0.25 mcg total) by mouth every Tuesday, Thursday, and Saturday at 6 PM.   calcium carbonate (OS-CAL - DOSED IN MG OF ELEMENTAL CALCIUM) 1250 (500 Ca) MG tablet Take 1 tablet (1,250 mg total) by mouth daily with breakfast.   gabapentin (NEURONTIN) 100 MG capsule Take 100-200 mg by mouth at bedtime.   midodrine  (PROAMATINE) 10 MG tablet Take 5 mg by mouth See admin instructions. Take 1 tablet (5mg ) by mouth 30 minutes before dialysis and take 1 tablet (5mg ) by mouth during dialysis if needed for BP support   multivitamin (RENA-VIT) TABS tablet Take 1 tablet by mouth daily.   sucroferric oxyhydroxide (VELPHORO) 500 MG chewable tablet Chew 500-1,000 mg by mouth See admin instructions. Take 2 tablets (1000mg ) by mouth three times daily with meals and take 1 tablet (500mg ) by mouth with snacks   No facility-administered encounter medications on file as of 11/19/2021.    Allergies (verified) Patient has no known allergies.   History: Past Medical History:  Diagnosis Date   Chronic kidney disease    Edema    RLE   Hepatitis    Hep C   History of blood transfusion    History of radiation therapy    right lung 02/24/2021-03/10/2021  Dr Gery Pray   Hypertension    Lung cancer [facility])    Wears dentures    Past Surgical History:  Procedure Laterality Date   A/V FISTULAGRAM Left 04/11/2019   Procedure: A/V FISTULAGRAM;  Surgeon: Katha Cabal, MD;  Location: [facility];  Service: Cardiovascular;  Laterality: Left;   A/V FISTULAGRAM Left 05/01/2019   Procedure: A/V FISTULAGRAM;  Surgeon: Algernon Huxley, MD;  Location: [place] [facility];  Service: Cardiovascular;  Laterality: Left;   A/V FISTULAGRAM Left 01/15/2020   Procedure: A/V FISTULAGRAM;  Surgeon: Algernon Huxley, MD;  Location: [place] [facility];  Service: Cardiovascular;  Laterality: Left;   A/V FISTULAGRAM Left 04/22/2020   Procedure: A/V FISTULAGRAM;  Surgeon: Algernon Huxley, MD;  Location: [place] [facility];  Service: Cardiovascular;  Laterality: Left;   A/V FISTULAGRAM Left 06/25/2021   Procedure: A/V Fistulagram;  Surgeon: Katha Cabal, MD;  Location: [place] [facility];  Service: Cardiovascular;  Laterality: Left;   AV FISTULA PLACEMENT Left 06/16/2018   Procedure: Left Arm ARTERIOVENOUS (AV) FISTULA CREATION;   Surgeon: Marty Heck, MD;  Location: [facility];  Service: Vascular;  Laterality: Left;   MULTIPLE TOOTH EXTRACTIONS     MYOMECTOMY     Family History  Problem Relation Age of Onset   Diabetes Mother    Hypertension Mother    Breast cancer Mother 50   Bone cancer Father    Stroke Brother    Diabetes Brother    Drug abuse Brother    Breast cancer Maternal Aunt 50   Breast cancer Maternal Aunt 70   Kidney cancer Maternal Grandmother    Social History   Socioeconomic History   Marital status: Significant Other    Spouse name: Not on file   Number of children: 0   Years of education: Not on file   Highest education level: Not on file  Occupational History   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   Tobacco Use   Smoking status: Former    Types: Cigarettes    Quit date: 1990    Years since quitting: 33.8   Smokeless tobacco: Never  Vaping Use   Vaping Use: Never used  Substance and Sexual Activity   Alcohol use: Not Currently    Alcohol/week: 6.0 standard drinks of alcohol    Types: 6 Cans of beer per week    Comment: quit 3-4 years ago   Drug use: Not Currently    Types: Marijuana    Comment: Years ago   Sexual activity: Not Currently  Other Topics Concern   Not on file  Social History Narrative   Lives at home in private residence with fiance' Joe    Social Determinants of Health   Financial Resource Strain: Low Risk  (11/19/2021)   Overall Financial Resource Strain (CARDIA)    Difficulty of Paying Living Expenses: Not hard at all  Food Insecurity: No Food Insecurity (11/19/2021)   Hunger Vital Sign    Worried About Running Out of Food in the Last Year: Never true    [place] in the Last Year: Never true  Transportation Needs: No Transportation Needs (11/19/2021)   PRAPARE - Hydrologist (Medical): No    Lack of Transportation (Non-Medical): No  Physical Activity: Insufficiently Active (11/19/2021)   Exercise Vital Sign     Days of Exercise per Week: 3 days    Minutes of Exercise per Session: 20 min  Stress: Stress Concern Present (11/19/2021)   [place]    Feeling of Stress : To some extent  Social Connections: Moderately Isolated (11/19/2021)   Social Connection and Isolation Panel [NHANES]    Frequency of Communication with Friends and Family: Three times a week    Frequency of Social Gatherings with Friends and Family: Three times a week    Attends Religious Services: More than 4 times per year  Active Member of Clubs or Organizations: No    Attends Archivist Meetings: Never    Marital Status: Never married    Tobacco Counseling Counseling given: Not Answered   Clinical Intake:  Pre-visit preparation completed: Yes  Pain : No/denies pain     Nutritional Risks: None Diabetes: No  How often do you need to have someone help you when you read instructions, pamphlets, or other written materials from your doctor or pharmacy?: 1 - Never  Diabetic?no  Interpreter Needed?: No  Information entered by :: Charlott Rakes, LPN   Activities of Daily Living    11/19/2021    8:38 AM 11/10/2021   11:00 AM  In your present state of health, do you have any difficulty performing the following activities:  Hearing? 0 0  Vision? 0 0  Difficulty concentrating or making decisions? 0 0  Walking or climbing stairs? 0 0  Dressing or bathing? 0 0  Doing errands, shopping? 0 0  Preparing Food and eating ? N   Using the Toilet? N   In the past six months, have you accidently leaked urine? N   Do you have problems with loss of bowel control? N   Managing your Medications? N   Managing your Finances? N   Housekeeping or managing your Housekeeping? N     Patient Care Team: Eugenia Pancoast, FNP as PCP - General (Family Medicine)  Indicate any recent Medical Services you may have received from other than [facility] providers in the  past year (date may be approximate).     Assessment:   This is a routine wellness examination for Kimberly Frost.  Hearing/Vision screen Hearing Screening - Comments:: Pt denies any hearing issues  Vision Screening - Comments:: Pt unsure of eye provider  Dietary issues and exercise activities discussed: Current Exercise Habits: Home exercise routine, Type of exercise: walking, Time (Minutes): 20, Frequency (Times/Week): 3, Weekly Exercise (Minutes/Week): 60   Goals Addressed               This Visit's Progress     Patient Stated (pt-stated)        Get in better health        Depression Screen    11/19/2021    8:30 AM 07/31/2019   10:55 AM 11/15/2014    9:52 AM 02/15/2013    3:19 PM  PHQ 2/9 Scores  PHQ - 2 Score 2 0 0 0  PHQ- 9 Score 5       Fall Risk    11/19/2021    8:38 AM 10/10/2021    8:22 AM 10/10/2021    8:19 AM 07/31/2019   10:56 AM 11/15/2014    9:52 AM  Fall Risk   Falls in the past year? 1 0  0 No  Number falls in past yr: 1 0 0    Injury with Fall? 0 0 0    Risk for fall due to : Impaired vision;Impaired balance/gait No Fall Risks No Fall Risks    Follow up Falls prevention discussed        FALL RISK PREVENTION PERTAINING TO THE HOME:  Any stairs in or around the home? No  If so, are there any without handrails? No  Home free of loose throw rugs in walkways, pet beds, electrical cords, etc? Yes  Adequate lighting in your home to reduce risk of falls? Yes   ASSISTIVE DEVICES UTILIZED TO PREVENT FALLS:  Life alert? No  Use of a cane, walker or w/c?  No  Grab bars in the bathroom? No  Shower chair or bench in shower? No  Elevated toilet seat or a handicapped toilet? No   TIMED UP AND GO:  Was the test performed? No .   Cognitive Function:        11/19/2021    8:39 AM  6CIT Screen  What Year? 0 points  What month? 3 points  What time? 0 points  Count back from 20 0 points  Months in reverse 4 points  Repeat phrase 4 points  Total Score 11  points    Immunizations Immunization History  Administered Date(s) Administered   Hepatitis B, adult 10/06/2018, 11/15/2018, 12/08/2018, 03/11/2019   Hepb-cpg 07/18/2019, 08/10/2019, 09/07/2019   Influenza,inj,Quad PF,6+ Mos 01/27/2013, 02/27/2018, 11/01/2018   Influenza-Unspecified 11/09/2019   Moderna Sars-Covid-2 Vaccination 04/06/2019, 05/04/2019, 11/21/2019   Pneumococcal Conjugate-13 07/31/2019    TDAP status: Due, Education has been provided regarding the importance of this vaccine. Advised may receive this vaccine at local pharmacy or Health Dept. Aware to provide a copy of the vaccination record if obtained from local pharmacy or Health Dept. Verbalized acceptance and understanding.  Flu Vaccine status: Due, Education has been provided regarding the importance of this vaccine. Advised may receive this vaccine at local pharmacy or Health Dept. Aware to provide a copy of the vaccination record if obtained from local pharmacy or Health Dept. Verbalized acceptance and understanding.  Pneumococcal vaccine status: Due, Education has been provided regarding the importance of this vaccine. Advised may receive this vaccine at local pharmacy or Health Dept. Aware to provide a copy of the vaccination record if obtained from local pharmacy or Health Dept. Verbalized acceptance and understanding.  Covid-19 vaccine status: Completed vaccines  Qualifies for Shingles Vaccine? Yes   Zostavax completed No   Shingrix Completed?: No.    Education has been provided regarding the importance of this vaccine. Patient has been advised to call insurance company to determine out of pocket expense if they have not yet received this vaccine. Advised may also receive vaccine at local pharmacy or Health Dept. Verbalized acceptance and understanding.  Screening Tests Health Maintenance  Topic Date Due   TETANUS/TDAP  Never done   Zoster Vaccines- Shingrix (1 of 2) Never done   Pneumonia Vaccine 68+ Years old  (2 - PPSV23 or PCV20) 09/25/2019   COVID-19 Vaccine (4 - Moderna risk series) 01/16/2020   INFLUENZA VACCINE  09/02/2021   MAMMOGRAM  10/04/2023   COLONOSCOPY (Pts 45-28yrs Insurance coverage will need to be confirmed)  11/06/2029   DEXA SCAN  Completed   Hepatitis C Screening  Completed   HPV VACCINES  Aged Out    Health Maintenance  Health Maintenance Due  Topic Date Due   TETANUS/TDAP  Never done   Zoster Vaccines- Shingrix (1 of 2) Never done   Pneumonia Vaccine 54+ Years old (2 - PPSV23 or PCV20) 09/25/2019   COVID-19 Vaccine (4 - Moderna risk series) 01/16/2020   INFLUENZA VACCINE  09/02/2021    Colorectal cancer screening: Type of screening: Colonoscopy. Completed 11/07/19. Repeat every 10 years  Mammogram status: Completed 10/03/21. Repeat every year  Bone Density status: Completed 10/03/21. Results reflect: Bone density results: OSTEOPOROSIS. Repeat every 2 years.  Additional Screening:  Hepatitis C Screening: Completed 11/10/21  Vision Screening: Recommended annual ophthalmology exams for early detection of glaucoma and other disorders of the eye. Is the patient up to date with their annual eye exam?  No  Who is the provider or what  is the name of the office in which the patient attends annual eye exams? Unsure of provider  If pt is not established with a provider, would they like to be referred to a provider to establish care? No .   Dental Screening: Recommended annual dental exams for proper oral hygiene  Community Resource Referral / Chronic Care Management: CRR required this visit?  No   CCM required this visit?  No      Plan:     I have personally reviewed and noted the following in the patient's chart:   Medical and social history Use of alcohol, tobacco or illicit drugs  Current medications and supplements including opioid prescriptions. Patient is not currently taking opioid prescriptions. Functional ability and status Nutritional status Physical  activity Advanced directives List of other physicians Hospitalizations, surgeries, and ER visits in previous 12 months Vitals Screenings to include cognitive, depression, and falls Referrals and appointments  In addition, I have reviewed and discussed with patient certain preventive protocols, quality metrics, and best practice recommendations. A written personalized care plan for preventive services as well as general preventive health recommendations were provided to patient.     Willette Brace, LPN   84/21/0312   Nurse Notes: none

## 2021-11-19 NOTE — Patient Instructions (Signed)
Kimberly Frost , Thank you for taking time to come for your Medicare Wellness Visit. I appreciate your ongoing commitment to your health goals. Please review the following plan we discussed and let me know if I can assist you in the future.   These are the goals we discussed:  Goals       Patient Stated (pt-stated)      Get in better health         This is a list of the screening recommended for you and due dates:  Health Maintenance  Topic Date Due   Tetanus Vaccine  Never done   Zoster (Shingles) Vaccine (1 of 2) Never done   Pneumonia Vaccine (2 - PPSV23 or PCV20) 09/25/2019   COVID-19 Vaccine (4 - Moderna risk series) 01/16/2020   Flu Shot  09/02/2021   Mammogram  10/04/2023   Colon Cancer Screening  11/06/2029   DEXA scan (bone density measurement)  Completed   Hepatitis C Screening: USPSTF Recommendation to screen - Ages 72-79 yo.  Completed   HPV Vaccine  Aged Out    Advanced directives: Advance directive discussed with you today. Even though you declined this today please call our office should you change your mind and we can give you the proper paperwork for you to fill out.  Conditions/risks identified: get in better health   Next appointment: Follow up in one year for your annual wellness visit    Preventive Care 65 Years and Older, Female Preventive care refers to lifestyle choices and visits with your health care provider that can promote health and wellness. What does preventive care include? A yearly physical exam. This is also called an annual well check. Dental exams once or twice a year. Routine eye exams. Ask your health care provider how often you should have your eyes checked. Personal lifestyle choices, including: Daily care of your teeth and gums. Regular physical activity. Eating a healthy diet. Avoiding tobacco and drug use. Limiting alcohol use. Practicing safe sex. Taking low-dose aspirin every day. Taking vitamin and mineral supplements as  recommended by your health care provider. What happens during an annual well check? The services and screenings done by your health care provider during your annual well check will depend on your age, overall health, lifestyle risk factors, and family history of disease. Counseling  Your health care provider may ask you questions about your: Alcohol use. Tobacco use. Drug use. Emotional well-being. Home and relationship well-being. Sexual activity. Eating habits. History of falls. Memory and ability to understand (cognition). Work and work Statistician. Reproductive health. Screening  You may have the following tests or measurements: Height, weight, and BMI. Blood pressure. Lipid and cholesterol levels. These may be checked every 5 years, or more frequently if you are over 32 years old. Skin check. Lung cancer screening. You may have this screening every year starting at age 71 if you have a 30-pack-year history of smoking and currently smoke or have quit within the past 15 years. Fecal occult blood test (FOBT) of the stool. You may have this test every year starting at age 109. Flexible sigmoidoscopy or colonoscopy. You may have a sigmoidoscopy every 5 years or a colonoscopy every 10 years starting at age 60. Hepatitis C blood test. Hepatitis B blood test. Sexually transmitted disease (STD) testing. Diabetes screening. This is done by checking your blood sugar (glucose) after you have not eaten for a while (fasting). You may have this done every 1-3 years. Bone density scan. This is  done to screen for osteoporosis. You may have this done starting at age 50. Mammogram. This may be done every 1-2 years. Talk to your health care provider about how often you should have regular mammograms. Talk with your health care provider about your test results, treatment options, and if necessary, the need for more tests. Vaccines  Your health care provider may recommend certain vaccines, such  as: Influenza vaccine. This is recommended every year. Tetanus, diphtheria, and acellular pertussis (Tdap, Td) vaccine. You may need a Td booster every 10 years. Zoster vaccine. You may need this after age 60. Pneumococcal 13-valent conjugate (PCV13) vaccine. One dose is recommended after age 74. Pneumococcal polysaccharide (PPSV23) vaccine. One dose is recommended after age 70. Talk to your health care provider about which screenings and vaccines you need and how often you need them. This information is not intended to replace advice given to you by your health care provider. Make sure you discuss any questions you have with your health care provider. Document Released: 02/15/2015 Document Revised: 10/09/2015 Document Reviewed: 11/20/2014 Elsevier Interactive Patient Education  2017 [employer] Prevention in the Home Falls can cause injuries. They can happen to people of all ages. There are many things you can do to make your home safe and to help prevent falls. What can I do on the outside of my home? Regularly fix the edges of walkways and driveways and fix any cracks. Remove anything that might make you trip as you walk through a door, such as a raised step or threshold. Trim any bushes or trees on the path to your home. Use bright outdoor lighting. Clear any walking paths of anything that might make someone trip, such as rocks or tools. Regularly check to see if handrails are loose or broken. Make sure that both sides of any steps have handrails. Any raised decks and porches should have guardrails on the edges. Have any leaves, snow, or ice cleared regularly. Use sand or salt on walking paths during winter. Clean up any spills in your garage right away. This includes oil or grease spills. What can I do in the bathroom? Use night lights. Install grab bars by the toilet and in the tub and shower. Do not use towel bars as grab bars. Use non-skid mats or decals in the tub or  shower. If you need to sit down in the shower, use a plastic, non-slip stool. Keep the floor dry. Clean up any water that spills on the floor as soon as it happens. Remove soap buildup in the tub or shower regularly. Attach bath mats securely with double-sided non-slip rug tape. Do not have throw rugs and other things on the floor that can make you trip. What can I do in the bedroom? Use night lights. Make sure that you have a light by your bed that is easy to reach. Do not use any sheets or blankets that are too big for your bed. They should not hang down onto the floor. Have a firm chair that has side arms. You can use this for support while you get dressed. Do not have throw rugs and other things on the floor that can make you trip. What can I do in the kitchen? Clean up any spills right away. Avoid walking on wet floors. Keep items that you use a lot in easy-to-reach places. If you need to reach something above you, use a strong step stool that has a grab bar. Keep electrical cords out of  the way. Do not use floor polish or wax that makes floors slippery. If you must use wax, use non-skid floor wax. Do not have throw rugs and other things on the floor that can make you trip. What can I do with my stairs? Do not leave any items on the stairs. Make sure that there are handrails on both sides of the stairs and use them. Fix handrails that are broken or loose. Make sure that handrails are as long as the stairways. Check any carpeting to make sure that it is firmly attached to the stairs. Fix any carpet that is loose or worn. Avoid having throw rugs at the top or bottom of the stairs. If you do have throw rugs, attach them to the floor with carpet tape. Make sure that you have a light switch at the top of the stairs and the bottom of the stairs. If you do not have them, ask someone to add them for you. What else can I do to help prevent falls? Wear shoes that: Do not have high heels. Have  rubber bottoms. Are comfortable and fit you well. Are closed at the toe. Do not wear sandals. If you use a stepladder: Make sure that it is fully opened. Do not climb a closed stepladder. Make sure that both sides of the stepladder are locked into place. Ask someone to hold it for you, if possible. Clearly mark and make sure that you can see: Any grab bars or handrails. First and last steps. Where the edge of each step is. Use tools that help you move around (mobility aids) if they are needed. These include: Canes. Walkers. Scooters. Crutches. Turn on the lights when you go into a dark area. Replace any light bulbs as soon as they burn out. Set up your furniture so you have a clear path. Avoid moving your furniture around. If any of your floors are uneven, fix them. If there are any pets around you, be aware of where they are. Review your medicines with your doctor. Some medicines can make you feel dizzy. This can increase your chance of falling. Ask your doctor what other things that you can do to help prevent falls. This information is not intended to replace advice given to you by your health care provider. Make sure you discuss any questions you have with your health care provider. Document Released: 11/15/2008 Document Revised: 06/27/2015 Document Reviewed: 02/23/2014 Elsevier Interactive Patient Education  2017 [employer].

## 2021-11-19 NOTE — Progress Notes (Signed)
Called to speak with patient about recent lab work sent from [facility], which has been sent  also to her nephrology as well as to me.   Pt was notified of her high pth, she has to take calcitrol, RX has been sent in and she is taking (one 1250 mg once daily) . She states that her nephrologist also told her to take 2000 mg tums.   Vitamin d very low, nephrology is aware.  Nephrologist: Dr. Posey Pronto   She has f/u with him, and had lab work as well today pending results

## 2021-11-20 DIAGNOSIS — D631 Anemia in chronic kidney disease: Secondary | ICD-10-CM | POA: Diagnosis not present

## 2021-11-20 DIAGNOSIS — N2581 Secondary hyperparathyroidism of renal origin: Secondary | ICD-10-CM | POA: Diagnosis not present

## 2021-11-20 DIAGNOSIS — N186 End stage renal disease: Secondary | ICD-10-CM | POA: Diagnosis not present

## 2021-11-20 DIAGNOSIS — Z992 Dependence on renal dialysis: Secondary | ICD-10-CM | POA: Diagnosis not present

## 2021-11-20 DIAGNOSIS — D508 Other iron deficiency anemias: Secondary | ICD-10-CM | POA: Diagnosis not present

## 2021-11-20 DIAGNOSIS — D689 Coagulation defect, unspecified: Secondary | ICD-10-CM | POA: Diagnosis not present

## 2021-11-20 DIAGNOSIS — D509 Iron deficiency anemia, unspecified: Secondary | ICD-10-CM | POA: Diagnosis not present

## 2021-11-22 DIAGNOSIS — D509 Iron deficiency anemia, unspecified: Secondary | ICD-10-CM | POA: Diagnosis not present

## 2021-11-22 DIAGNOSIS — D689 Coagulation defect, unspecified: Secondary | ICD-10-CM | POA: Diagnosis not present

## 2021-11-22 DIAGNOSIS — Z992 Dependence on renal dialysis: Secondary | ICD-10-CM | POA: Diagnosis not present

## 2021-11-22 DIAGNOSIS — N2581 Secondary hyperparathyroidism of renal origin: Secondary | ICD-10-CM | POA: Diagnosis not present

## 2021-11-22 DIAGNOSIS — N186 End stage renal disease: Secondary | ICD-10-CM | POA: Diagnosis not present

## 2021-11-22 DIAGNOSIS — D631 Anemia in chronic kidney disease: Secondary | ICD-10-CM | POA: Diagnosis not present

## 2021-11-22 DIAGNOSIS — D508 Other iron deficiency anemias: Secondary | ICD-10-CM | POA: Diagnosis not present

## 2021-11-25 DIAGNOSIS — D509 Iron deficiency anemia, unspecified: Secondary | ICD-10-CM | POA: Diagnosis not present

## 2021-11-25 DIAGNOSIS — D508 Other iron deficiency anemias: Secondary | ICD-10-CM | POA: Diagnosis not present

## 2021-11-25 DIAGNOSIS — D689 Coagulation defect, unspecified: Secondary | ICD-10-CM | POA: Diagnosis not present

## 2021-11-25 DIAGNOSIS — N186 End stage renal disease: Secondary | ICD-10-CM | POA: Diagnosis not present

## 2021-11-25 DIAGNOSIS — D631 Anemia in chronic kidney disease: Secondary | ICD-10-CM | POA: Diagnosis not present

## 2021-11-25 DIAGNOSIS — N2581 Secondary hyperparathyroidism of renal origin: Secondary | ICD-10-CM | POA: Diagnosis not present

## 2021-11-25 DIAGNOSIS — Z992 Dependence on renal dialysis: Secondary | ICD-10-CM | POA: Diagnosis not present

## 2021-11-27 DIAGNOSIS — D509 Iron deficiency anemia, unspecified: Secondary | ICD-10-CM | POA: Diagnosis not present

## 2021-11-27 DIAGNOSIS — N2581 Secondary hyperparathyroidism of renal origin: Secondary | ICD-10-CM | POA: Diagnosis not present

## 2021-11-27 DIAGNOSIS — N186 End stage renal disease: Secondary | ICD-10-CM | POA: Diagnosis not present

## 2021-11-27 DIAGNOSIS — Z992 Dependence on renal dialysis: Secondary | ICD-10-CM | POA: Diagnosis not present

## 2021-11-27 DIAGNOSIS — D508 Other iron deficiency anemias: Secondary | ICD-10-CM | POA: Diagnosis not present

## 2021-11-27 DIAGNOSIS — D689 Coagulation defect, unspecified: Secondary | ICD-10-CM | POA: Diagnosis not present

## 2021-11-27 DIAGNOSIS — D631 Anemia in chronic kidney disease: Secondary | ICD-10-CM | POA: Diagnosis not present

## 2021-11-29 DIAGNOSIS — D689 Coagulation defect, unspecified: Secondary | ICD-10-CM | POA: Diagnosis not present

## 2021-11-29 DIAGNOSIS — D509 Iron deficiency anemia, unspecified: Secondary | ICD-10-CM | POA: Diagnosis not present

## 2021-11-29 DIAGNOSIS — D508 Other iron deficiency anemias: Secondary | ICD-10-CM | POA: Diagnosis not present

## 2021-11-29 DIAGNOSIS — N2581 Secondary hyperparathyroidism of renal origin: Secondary | ICD-10-CM | POA: Diagnosis not present

## 2021-11-29 DIAGNOSIS — N186 End stage renal disease: Secondary | ICD-10-CM | POA: Diagnosis not present

## 2021-11-29 DIAGNOSIS — D631 Anemia in chronic kidney disease: Secondary | ICD-10-CM | POA: Diagnosis not present

## 2021-11-29 DIAGNOSIS — Z992 Dependence on renal dialysis: Secondary | ICD-10-CM | POA: Diagnosis not present

## 2021-12-02 DIAGNOSIS — D508 Other iron deficiency anemias: Secondary | ICD-10-CM | POA: Diagnosis not present

## 2021-12-02 DIAGNOSIS — D631 Anemia in chronic kidney disease: Secondary | ICD-10-CM | POA: Diagnosis not present

## 2021-12-02 DIAGNOSIS — I129 Hypertensive chronic kidney disease with stage 1 through stage 4 chronic kidney disease, or unspecified chronic kidney disease: Secondary | ICD-10-CM | POA: Diagnosis not present

## 2021-12-02 DIAGNOSIS — Z992 Dependence on renal dialysis: Secondary | ICD-10-CM | POA: Diagnosis not present

## 2021-12-02 DIAGNOSIS — N2581 Secondary hyperparathyroidism of renal origin: Secondary | ICD-10-CM | POA: Diagnosis not present

## 2021-12-02 DIAGNOSIS — D689 Coagulation defect, unspecified: Secondary | ICD-10-CM | POA: Diagnosis not present

## 2021-12-02 DIAGNOSIS — D509 Iron deficiency anemia, unspecified: Secondary | ICD-10-CM | POA: Diagnosis not present

## 2021-12-02 DIAGNOSIS — N186 End stage renal disease: Secondary | ICD-10-CM | POA: Diagnosis not present

## 2021-12-04 DIAGNOSIS — N186 End stage renal disease: Secondary | ICD-10-CM | POA: Diagnosis not present

## 2021-12-04 DIAGNOSIS — N2581 Secondary hyperparathyroidism of renal origin: Secondary | ICD-10-CM | POA: Diagnosis not present

## 2021-12-04 DIAGNOSIS — D508 Other iron deficiency anemias: Secondary | ICD-10-CM | POA: Diagnosis not present

## 2021-12-04 DIAGNOSIS — D689 Coagulation defect, unspecified: Secondary | ICD-10-CM | POA: Diagnosis not present

## 2021-12-04 DIAGNOSIS — D631 Anemia in chronic kidney disease: Secondary | ICD-10-CM | POA: Diagnosis not present

## 2021-12-04 DIAGNOSIS — Z23 Encounter for immunization: Secondary | ICD-10-CM | POA: Diagnosis not present

## 2021-12-04 DIAGNOSIS — Z992 Dependence on renal dialysis: Secondary | ICD-10-CM | POA: Diagnosis not present

## 2021-12-06 DIAGNOSIS — N2581 Secondary hyperparathyroidism of renal origin: Secondary | ICD-10-CM | POA: Diagnosis not present

## 2021-12-06 DIAGNOSIS — D631 Anemia in chronic kidney disease: Secondary | ICD-10-CM | POA: Diagnosis not present

## 2021-12-06 DIAGNOSIS — D689 Coagulation defect, unspecified: Secondary | ICD-10-CM | POA: Diagnosis not present

## 2021-12-06 DIAGNOSIS — Z23 Encounter for immunization: Secondary | ICD-10-CM | POA: Diagnosis not present

## 2021-12-06 DIAGNOSIS — Z992 Dependence on renal dialysis: Secondary | ICD-10-CM | POA: Diagnosis not present

## 2021-12-06 DIAGNOSIS — D508 Other iron deficiency anemias: Secondary | ICD-10-CM | POA: Diagnosis not present

## 2021-12-06 DIAGNOSIS — N186 End stage renal disease: Secondary | ICD-10-CM | POA: Diagnosis not present

## 2021-12-09 DIAGNOSIS — N2581 Secondary hyperparathyroidism of renal origin: Secondary | ICD-10-CM | POA: Diagnosis not present

## 2021-12-09 DIAGNOSIS — D689 Coagulation defect, unspecified: Secondary | ICD-10-CM | POA: Diagnosis not present

## 2021-12-09 DIAGNOSIS — N186 End stage renal disease: Secondary | ICD-10-CM | POA: Diagnosis not present

## 2021-12-09 DIAGNOSIS — Z23 Encounter for immunization: Secondary | ICD-10-CM | POA: Diagnosis not present

## 2021-12-09 DIAGNOSIS — D631 Anemia in chronic kidney disease: Secondary | ICD-10-CM | POA: Diagnosis not present

## 2021-12-09 DIAGNOSIS — D508 Other iron deficiency anemias: Secondary | ICD-10-CM | POA: Diagnosis not present

## 2021-12-09 DIAGNOSIS — Z992 Dependence on renal dialysis: Secondary | ICD-10-CM | POA: Diagnosis not present

## 2021-12-11 DIAGNOSIS — Z992 Dependence on renal dialysis: Secondary | ICD-10-CM | POA: Diagnosis not present

## 2021-12-11 DIAGNOSIS — Z23 Encounter for immunization: Secondary | ICD-10-CM | POA: Diagnosis not present

## 2021-12-11 DIAGNOSIS — D689 Coagulation defect, unspecified: Secondary | ICD-10-CM | POA: Diagnosis not present

## 2021-12-11 DIAGNOSIS — N186 End stage renal disease: Secondary | ICD-10-CM | POA: Diagnosis not present

## 2021-12-11 DIAGNOSIS — N2581 Secondary hyperparathyroidism of renal origin: Secondary | ICD-10-CM | POA: Diagnosis not present

## 2021-12-11 DIAGNOSIS — D631 Anemia in chronic kidney disease: Secondary | ICD-10-CM | POA: Diagnosis not present

## 2021-12-11 DIAGNOSIS — D508 Other iron deficiency anemias: Secondary | ICD-10-CM | POA: Diagnosis not present

## 2021-12-13 DIAGNOSIS — Z23 Encounter for immunization: Secondary | ICD-10-CM | POA: Diagnosis not present

## 2021-12-13 DIAGNOSIS — D508 Other iron deficiency anemias: Secondary | ICD-10-CM | POA: Diagnosis not present

## 2021-12-13 DIAGNOSIS — D631 Anemia in chronic kidney disease: Secondary | ICD-10-CM | POA: Diagnosis not present

## 2021-12-13 DIAGNOSIS — N186 End stage renal disease: Secondary | ICD-10-CM | POA: Diagnosis not present

## 2021-12-13 DIAGNOSIS — Z992 Dependence on renal dialysis: Secondary | ICD-10-CM | POA: Diagnosis not present

## 2021-12-13 DIAGNOSIS — N2581 Secondary hyperparathyroidism of renal origin: Secondary | ICD-10-CM | POA: Diagnosis not present

## 2021-12-13 DIAGNOSIS — D689 Coagulation defect, unspecified: Secondary | ICD-10-CM | POA: Diagnosis not present

## 2021-12-16 DIAGNOSIS — Z992 Dependence on renal dialysis: Secondary | ICD-10-CM | POA: Diagnosis not present

## 2021-12-16 DIAGNOSIS — Z23 Encounter for immunization: Secondary | ICD-10-CM | POA: Diagnosis not present

## 2021-12-16 DIAGNOSIS — D631 Anemia in chronic kidney disease: Secondary | ICD-10-CM | POA: Diagnosis not present

## 2021-12-16 DIAGNOSIS — N186 End stage renal disease: Secondary | ICD-10-CM | POA: Diagnosis not present

## 2021-12-16 DIAGNOSIS — D689 Coagulation defect, unspecified: Secondary | ICD-10-CM | POA: Diagnosis not present

## 2021-12-16 DIAGNOSIS — N2581 Secondary hyperparathyroidism of renal origin: Secondary | ICD-10-CM | POA: Diagnosis not present

## 2021-12-16 DIAGNOSIS — D508 Other iron deficiency anemias: Secondary | ICD-10-CM | POA: Diagnosis not present

## 2021-12-18 DIAGNOSIS — N186 End stage renal disease: Secondary | ICD-10-CM | POA: Diagnosis not present

## 2021-12-18 DIAGNOSIS — D631 Anemia in chronic kidney disease: Secondary | ICD-10-CM | POA: Diagnosis not present

## 2021-12-18 DIAGNOSIS — D689 Coagulation defect, unspecified: Secondary | ICD-10-CM | POA: Diagnosis not present

## 2021-12-18 DIAGNOSIS — D508 Other iron deficiency anemias: Secondary | ICD-10-CM | POA: Diagnosis not present

## 2021-12-18 DIAGNOSIS — Z23 Encounter for immunization: Secondary | ICD-10-CM | POA: Diagnosis not present

## 2021-12-18 DIAGNOSIS — Z992 Dependence on renal dialysis: Secondary | ICD-10-CM | POA: Diagnosis not present

## 2021-12-18 DIAGNOSIS — N2581 Secondary hyperparathyroidism of renal origin: Secondary | ICD-10-CM | POA: Diagnosis not present

## 2021-12-20 DIAGNOSIS — Z992 Dependence on renal dialysis: Secondary | ICD-10-CM | POA: Diagnosis not present

## 2021-12-20 DIAGNOSIS — D689 Coagulation defect, unspecified: Secondary | ICD-10-CM | POA: Diagnosis not present

## 2021-12-20 DIAGNOSIS — Z23 Encounter for immunization: Secondary | ICD-10-CM | POA: Diagnosis not present

## 2021-12-20 DIAGNOSIS — D631 Anemia in chronic kidney disease: Secondary | ICD-10-CM | POA: Diagnosis not present

## 2021-12-20 DIAGNOSIS — D508 Other iron deficiency anemias: Secondary | ICD-10-CM | POA: Diagnosis not present

## 2021-12-20 DIAGNOSIS — N186 End stage renal disease: Secondary | ICD-10-CM | POA: Diagnosis not present

## 2021-12-20 DIAGNOSIS — N2581 Secondary hyperparathyroidism of renal origin: Secondary | ICD-10-CM | POA: Diagnosis not present

## 2021-12-22 DIAGNOSIS — D631 Anemia in chronic kidney disease: Secondary | ICD-10-CM | POA: Diagnosis not present

## 2021-12-22 DIAGNOSIS — Z23 Encounter for immunization: Secondary | ICD-10-CM | POA: Diagnosis not present

## 2021-12-22 DIAGNOSIS — D689 Coagulation defect, unspecified: Secondary | ICD-10-CM | POA: Diagnosis not present

## 2021-12-22 DIAGNOSIS — N186 End stage renal disease: Secondary | ICD-10-CM | POA: Diagnosis not present

## 2021-12-22 DIAGNOSIS — Z992 Dependence on renal dialysis: Secondary | ICD-10-CM | POA: Diagnosis not present

## 2021-12-22 DIAGNOSIS — D508 Other iron deficiency anemias: Secondary | ICD-10-CM | POA: Diagnosis not present

## 2021-12-22 DIAGNOSIS — N2581 Secondary hyperparathyroidism of renal origin: Secondary | ICD-10-CM | POA: Diagnosis not present

## 2021-12-24 DIAGNOSIS — Z23 Encounter for immunization: Secondary | ICD-10-CM | POA: Diagnosis not present

## 2021-12-24 DIAGNOSIS — D689 Coagulation defect, unspecified: Secondary | ICD-10-CM | POA: Diagnosis not present

## 2021-12-24 DIAGNOSIS — N2581 Secondary hyperparathyroidism of renal origin: Secondary | ICD-10-CM | POA: Diagnosis not present

## 2021-12-24 DIAGNOSIS — D508 Other iron deficiency anemias: Secondary | ICD-10-CM | POA: Diagnosis not present

## 2021-12-24 DIAGNOSIS — D631 Anemia in chronic kidney disease: Secondary | ICD-10-CM | POA: Diagnosis not present

## 2021-12-24 DIAGNOSIS — N186 End stage renal disease: Secondary | ICD-10-CM | POA: Diagnosis not present

## 2021-12-24 DIAGNOSIS — Z992 Dependence on renal dialysis: Secondary | ICD-10-CM | POA: Diagnosis not present

## 2021-12-27 DIAGNOSIS — D631 Anemia in chronic kidney disease: Secondary | ICD-10-CM | POA: Diagnosis not present

## 2021-12-27 DIAGNOSIS — N186 End stage renal disease: Secondary | ICD-10-CM | POA: Diagnosis not present

## 2021-12-27 DIAGNOSIS — N2581 Secondary hyperparathyroidism of renal origin: Secondary | ICD-10-CM | POA: Diagnosis not present

## 2021-12-27 DIAGNOSIS — Z23 Encounter for immunization: Secondary | ICD-10-CM | POA: Diagnosis not present

## 2021-12-27 DIAGNOSIS — D689 Coagulation defect, unspecified: Secondary | ICD-10-CM | POA: Diagnosis not present

## 2021-12-27 DIAGNOSIS — D508 Other iron deficiency anemias: Secondary | ICD-10-CM | POA: Diagnosis not present

## 2021-12-27 DIAGNOSIS — Z992 Dependence on renal dialysis: Secondary | ICD-10-CM | POA: Diagnosis not present

## 2021-12-30 DIAGNOSIS — N186 End stage renal disease: Secondary | ICD-10-CM | POA: Diagnosis not present

## 2021-12-30 DIAGNOSIS — Z23 Encounter for immunization: Secondary | ICD-10-CM | POA: Diagnosis not present

## 2021-12-30 DIAGNOSIS — D508 Other iron deficiency anemias: Secondary | ICD-10-CM | POA: Diagnosis not present

## 2021-12-30 DIAGNOSIS — Z992 Dependence on renal dialysis: Secondary | ICD-10-CM | POA: Diagnosis not present

## 2021-12-30 DIAGNOSIS — D689 Coagulation defect, unspecified: Secondary | ICD-10-CM | POA: Diagnosis not present

## 2021-12-30 DIAGNOSIS — D631 Anemia in chronic kidney disease: Secondary | ICD-10-CM | POA: Diagnosis not present

## 2021-12-30 DIAGNOSIS — N2581 Secondary hyperparathyroidism of renal origin: Secondary | ICD-10-CM | POA: Diagnosis not present

## 2022-01-01 DIAGNOSIS — N2581 Secondary hyperparathyroidism of renal origin: Secondary | ICD-10-CM | POA: Diagnosis not present

## 2022-01-01 DIAGNOSIS — D508 Other iron deficiency anemias: Secondary | ICD-10-CM | POA: Diagnosis not present

## 2022-01-01 DIAGNOSIS — D631 Anemia in chronic kidney disease: Secondary | ICD-10-CM | POA: Diagnosis not present

## 2022-01-01 DIAGNOSIS — Z992 Dependence on renal dialysis: Secondary | ICD-10-CM | POA: Diagnosis not present

## 2022-01-01 DIAGNOSIS — D689 Coagulation defect, unspecified: Secondary | ICD-10-CM | POA: Diagnosis not present

## 2022-01-01 DIAGNOSIS — I129 Hypertensive chronic kidney disease with stage 1 through stage 4 chronic kidney disease, or unspecified chronic kidney disease: Secondary | ICD-10-CM | POA: Diagnosis not present

## 2022-01-01 DIAGNOSIS — N186 End stage renal disease: Secondary | ICD-10-CM | POA: Diagnosis not present

## 2022-01-01 DIAGNOSIS — Z23 Encounter for immunization: Secondary | ICD-10-CM | POA: Diagnosis not present

## 2022-01-03 DIAGNOSIS — N2581 Secondary hyperparathyroidism of renal origin: Secondary | ICD-10-CM | POA: Diagnosis not present

## 2022-01-03 DIAGNOSIS — Z992 Dependence on renal dialysis: Secondary | ICD-10-CM | POA: Diagnosis not present

## 2022-01-03 DIAGNOSIS — D689 Coagulation defect, unspecified: Secondary | ICD-10-CM | POA: Diagnosis not present

## 2022-01-03 DIAGNOSIS — N186 End stage renal disease: Secondary | ICD-10-CM | POA: Diagnosis not present

## 2022-01-06 DIAGNOSIS — Z992 Dependence on renal dialysis: Secondary | ICD-10-CM | POA: Diagnosis not present

## 2022-01-06 DIAGNOSIS — N186 End stage renal disease: Secondary | ICD-10-CM | POA: Diagnosis not present

## 2022-01-06 DIAGNOSIS — N2581 Secondary hyperparathyroidism of renal origin: Secondary | ICD-10-CM | POA: Diagnosis not present

## 2022-01-06 DIAGNOSIS — D689 Coagulation defect, unspecified: Secondary | ICD-10-CM | POA: Diagnosis not present

## 2022-01-08 DIAGNOSIS — D689 Coagulation defect, unspecified: Secondary | ICD-10-CM | POA: Diagnosis not present

## 2022-01-08 DIAGNOSIS — N2581 Secondary hyperparathyroidism of renal origin: Secondary | ICD-10-CM | POA: Diagnosis not present

## 2022-01-08 DIAGNOSIS — Z992 Dependence on renal dialysis: Secondary | ICD-10-CM | POA: Diagnosis not present

## 2022-01-08 DIAGNOSIS — N186 End stage renal disease: Secondary | ICD-10-CM | POA: Diagnosis not present

## 2022-01-10 DIAGNOSIS — D689 Coagulation defect, unspecified: Secondary | ICD-10-CM | POA: Diagnosis not present

## 2022-01-10 DIAGNOSIS — N186 End stage renal disease: Secondary | ICD-10-CM | POA: Diagnosis not present

## 2022-01-10 DIAGNOSIS — Z992 Dependence on renal dialysis: Secondary | ICD-10-CM | POA: Diagnosis not present

## 2022-01-10 DIAGNOSIS — N2581 Secondary hyperparathyroidism of renal origin: Secondary | ICD-10-CM | POA: Diagnosis not present

## 2022-01-13 DIAGNOSIS — D689 Coagulation defect, unspecified: Secondary | ICD-10-CM | POA: Diagnosis not present

## 2022-01-13 DIAGNOSIS — N2581 Secondary hyperparathyroidism of renal origin: Secondary | ICD-10-CM | POA: Diagnosis not present

## 2022-01-13 DIAGNOSIS — Z992 Dependence on renal dialysis: Secondary | ICD-10-CM | POA: Diagnosis not present

## 2022-01-13 DIAGNOSIS — N186 End stage renal disease: Secondary | ICD-10-CM | POA: Diagnosis not present

## 2022-01-15 DIAGNOSIS — Z992 Dependence on renal dialysis: Secondary | ICD-10-CM | POA: Diagnosis not present

## 2022-01-15 DIAGNOSIS — N2581 Secondary hyperparathyroidism of renal origin: Secondary | ICD-10-CM | POA: Diagnosis not present

## 2022-01-15 DIAGNOSIS — D689 Coagulation defect, unspecified: Secondary | ICD-10-CM | POA: Diagnosis not present

## 2022-01-15 DIAGNOSIS — N186 End stage renal disease: Secondary | ICD-10-CM | POA: Diagnosis not present

## 2022-01-20 DIAGNOSIS — Z992 Dependence on renal dialysis: Secondary | ICD-10-CM | POA: Diagnosis not present

## 2022-01-20 DIAGNOSIS — N2581 Secondary hyperparathyroidism of renal origin: Secondary | ICD-10-CM | POA: Diagnosis not present

## 2022-01-20 DIAGNOSIS — N186 End stage renal disease: Secondary | ICD-10-CM | POA: Diagnosis not present

## 2022-01-20 DIAGNOSIS — D689 Coagulation defect, unspecified: Secondary | ICD-10-CM | POA: Diagnosis not present

## 2022-01-22 DIAGNOSIS — D689 Coagulation defect, unspecified: Secondary | ICD-10-CM | POA: Diagnosis not present

## 2022-01-22 DIAGNOSIS — N2581 Secondary hyperparathyroidism of renal origin: Secondary | ICD-10-CM | POA: Diagnosis not present

## 2022-01-22 DIAGNOSIS — Z992 Dependence on renal dialysis: Secondary | ICD-10-CM | POA: Diagnosis not present

## 2022-01-22 DIAGNOSIS — N186 End stage renal disease: Secondary | ICD-10-CM | POA: Diagnosis not present

## 2022-01-24 DIAGNOSIS — N2581 Secondary hyperparathyroidism of renal origin: Secondary | ICD-10-CM | POA: Diagnosis not present

## 2022-01-24 DIAGNOSIS — Z992 Dependence on renal dialysis: Secondary | ICD-10-CM | POA: Diagnosis not present

## 2022-01-24 DIAGNOSIS — D689 Coagulation defect, unspecified: Secondary | ICD-10-CM | POA: Diagnosis not present

## 2022-01-24 DIAGNOSIS — N186 End stage renal disease: Secondary | ICD-10-CM | POA: Diagnosis not present

## 2022-01-27 DIAGNOSIS — N186 End stage renal disease: Secondary | ICD-10-CM | POA: Diagnosis not present

## 2022-01-27 DIAGNOSIS — D689 Coagulation defect, unspecified: Secondary | ICD-10-CM | POA: Diagnosis not present

## 2022-01-27 DIAGNOSIS — N2581 Secondary hyperparathyroidism of renal origin: Secondary | ICD-10-CM | POA: Diagnosis not present

## 2022-01-27 DIAGNOSIS — Z992 Dependence on renal dialysis: Secondary | ICD-10-CM | POA: Diagnosis not present

## 2022-01-29 DIAGNOSIS — Z992 Dependence on renal dialysis: Secondary | ICD-10-CM | POA: Diagnosis not present

## 2022-01-29 DIAGNOSIS — N186 End stage renal disease: Secondary | ICD-10-CM | POA: Diagnosis not present

## 2022-01-29 DIAGNOSIS — D689 Coagulation defect, unspecified: Secondary | ICD-10-CM | POA: Diagnosis not present

## 2022-01-29 DIAGNOSIS — N2581 Secondary hyperparathyroidism of renal origin: Secondary | ICD-10-CM | POA: Diagnosis not present

## 2022-01-30 ENCOUNTER — Encounter (HOSPITAL_COMMUNITY): Payer: Self-pay

## 2022-01-30 ENCOUNTER — Ambulatory Visit: Payer: Medicare Other | Admitting: Family

## 2022-01-31 DIAGNOSIS — D689 Coagulation defect, unspecified: Secondary | ICD-10-CM | POA: Diagnosis not present

## 2022-01-31 DIAGNOSIS — N2581 Secondary hyperparathyroidism of renal origin: Secondary | ICD-10-CM | POA: Diagnosis not present

## 2022-01-31 DIAGNOSIS — Z992 Dependence on renal dialysis: Secondary | ICD-10-CM | POA: Diagnosis not present

## 2022-01-31 DIAGNOSIS — N186 End stage renal disease: Secondary | ICD-10-CM | POA: Diagnosis not present

## 2022-02-01 DIAGNOSIS — N186 End stage renal disease: Secondary | ICD-10-CM | POA: Diagnosis not present

## 2022-02-01 DIAGNOSIS — Z992 Dependence on renal dialysis: Secondary | ICD-10-CM | POA: Diagnosis not present

## 2022-02-01 DIAGNOSIS — I129 Hypertensive chronic kidney disease with stage 1 through stage 4 chronic kidney disease, or unspecified chronic kidney disease: Secondary | ICD-10-CM | POA: Diagnosis not present

## 2022-02-03 DIAGNOSIS — D509 Iron deficiency anemia, unspecified: Secondary | ICD-10-CM | POA: Diagnosis not present

## 2022-02-03 DIAGNOSIS — Z992 Dependence on renal dialysis: Secondary | ICD-10-CM | POA: Diagnosis not present

## 2022-02-03 DIAGNOSIS — N2581 Secondary hyperparathyroidism of renal origin: Secondary | ICD-10-CM | POA: Diagnosis not present

## 2022-02-03 DIAGNOSIS — D631 Anemia in chronic kidney disease: Secondary | ICD-10-CM | POA: Diagnosis not present

## 2022-02-03 DIAGNOSIS — N186 End stage renal disease: Secondary | ICD-10-CM | POA: Diagnosis not present

## 2022-02-03 DIAGNOSIS — D689 Coagulation defect, unspecified: Secondary | ICD-10-CM | POA: Diagnosis not present

## 2022-02-03 DIAGNOSIS — D508 Other iron deficiency anemias: Secondary | ICD-10-CM | POA: Diagnosis not present

## 2022-02-05 DIAGNOSIS — Z992 Dependence on renal dialysis: Secondary | ICD-10-CM | POA: Diagnosis not present

## 2022-02-05 DIAGNOSIS — D689 Coagulation defect, unspecified: Secondary | ICD-10-CM | POA: Diagnosis not present

## 2022-02-05 DIAGNOSIS — D509 Iron deficiency anemia, unspecified: Secondary | ICD-10-CM | POA: Diagnosis not present

## 2022-02-05 DIAGNOSIS — N186 End stage renal disease: Secondary | ICD-10-CM | POA: Diagnosis not present

## 2022-02-05 DIAGNOSIS — N2581 Secondary hyperparathyroidism of renal origin: Secondary | ICD-10-CM | POA: Diagnosis not present

## 2022-02-05 DIAGNOSIS — D631 Anemia in chronic kidney disease: Secondary | ICD-10-CM | POA: Diagnosis not present

## 2022-02-05 DIAGNOSIS — D508 Other iron deficiency anemias: Secondary | ICD-10-CM | POA: Diagnosis not present

## 2022-02-06 ENCOUNTER — Ambulatory Visit
Admission: RE | Admit: 2022-02-06 | Discharge: 2022-02-06 | Disposition: A | Discharge status: Home / Self Care | Payer: Medicare Other | Source: Ambulatory Visit | Attending: Family | Admitting: Family

## 2022-02-06 ENCOUNTER — Ambulatory Visit (INDEPENDENT_AMBULATORY_CARE_PROVIDER_SITE_OTHER): Payer: Medicare Other | Admitting: Family

## 2022-02-06 ENCOUNTER — Encounter: Payer: Self-pay | Admitting: Family

## 2022-02-06 VITALS — BP 118/58 | HR 97 | Ht 66.0 in | Wt 135.0 lb

## 2022-02-06 DIAGNOSIS — R1011 Right upper quadrant pain: Secondary | ICD-10-CM | POA: Insufficient documentation

## 2022-02-06 DIAGNOSIS — R198 Other specified symptoms and signs involving the digestive system and abdomen: Secondary | ICD-10-CM | POA: Diagnosis not present

## 2022-02-06 DIAGNOSIS — R1031 Right lower quadrant pain: Secondary | ICD-10-CM

## 2022-02-06 DIAGNOSIS — R102 Pelvic and perineal pain: Secondary | ICD-10-CM

## 2022-02-06 DIAGNOSIS — N281 Cyst of kidney, acquired: Secondary | ICD-10-CM | POA: Diagnosis not present

## 2022-02-06 MED ORDER — IOHEXOL 300 MG/ML  SOLN
100.0000 mL | Freq: Once | INTRAMUSCULAR | Status: AC | PRN
  Administered 2022-02-06: 100 mL via INTRAVENOUS

## 2022-02-06 NOTE — Addendum Note (Signed)
Addended by: Tammi Sou on: 02/06/2022 02:11 PM   Modules accepted: Orders

## 2022-02-06 NOTE — Patient Instructions (Signed)
  You wil

## 2022-02-06 NOTE — Assessment & Plan Note (Signed)
+   murphy on exam, jaundice on exam slight  Concern for biliary obstruction vs cholecystitis vs mets  Ct abd pelvis with contrast ordered stat  Lab workup ordered as well pending results   Advised pt if any worsening pain and or fever go to er and or call 911

## 2022-02-06 NOTE — Progress Notes (Signed)
Established Patient Office Visit  Subjective:  Patient ID: Kimberly Frost, female    DOB: February 11, 1950  Age: 72 y.o. MRN: [id number]  CC:  Chief Complaint  Patient presents with   Follow-up   Abdominal Pain    R side, w/nausea for several weeks   Abd pelvic CT with contrast  On dialysis for over 6 months no renal function cso can give all we want   HPI Kimberly Frost is here today for follow up.   Pt is with acute concerns.  Elevated PTH, seen by nephrology. Was advised in October to take calcitrol, she is taking 1250 mg once daily.   Last week started feeling like she had a cold. Started sneezing and her throat felt dry with weakness and fatigue.   Two days ago she was taking down the christmas tree and then started with rlq abdominal pain that radiated to the left lower abd quadrant and was sharp and stabbing. And since this occurred has been constant pain, she is taking tylenol with only mild relief. The constant sensation is dull and aching, also twisting sensation. Increased nausea as well. Decreased appetite and has lost about five pounds. No fever, with some chills. She did try to force herself to throw up without relief. No change in bowel movements, regular bowel movements. Able to drink water.   Denies any urinary symptoms.   Lung cancer, completed radiation in July, has f/u in pet scan in the next few months.   Past Medical History:  Diagnosis Date   Chronic kidney disease    Edema    RLE   Hepatitis    Hep C   History of blood transfusion    History of radiation therapy    right lung 02/24/2021-03/10/2021  Dr Gery Pray   Hypertension    Lung cancer [facility])    Wears dentures     Past Surgical History:  Procedure Laterality Date   A/V FISTULAGRAM Left 04/11/2019   Procedure: A/V FISTULAGRAM;  Surgeon: Katha Cabal, MD;  Location: [place] [facility];  Service: Cardiovascular;  Laterality: Left;   A/V FISTULAGRAM Left 05/01/2019   Procedure: A/V  FISTULAGRAM;  Surgeon: Algernon Huxley, MD;  Location: [place] [facility];  Service: Cardiovascular;  Laterality: Left;   A/V FISTULAGRAM Left 01/15/2020   Procedure: A/V FISTULAGRAM;  Surgeon: Algernon Huxley, MD;  Location: [place] [facility];  Service: Cardiovascular;  Laterality: Left;   A/V FISTULAGRAM Left 04/22/2020   Procedure: A/V FISTULAGRAM;  Surgeon: Algernon Huxley, MD;  Location: [place] [facility];  Service: Cardiovascular;  Laterality: Left;   A/V FISTULAGRAM Left 06/25/2021   Procedure: A/V Fistulagram;  Surgeon: Katha Cabal, MD;  Location: [place] [facility];  Service: Cardiovascular;  Laterality: Left;   AV FISTULA PLACEMENT Left 06/16/2018   Procedure: Left Arm ARTERIOVENOUS (AV) FISTULA CREATION;  Surgeon: Marty Heck, MD;  Location: [facility];  Service: Vascular;  Laterality: Left;   MULTIPLE TOOTH EXTRACTIONS     MYOMECTOMY      Family History  Problem Relation Age of Onset   Diabetes Mother    Hypertension Mother    Breast cancer Mother 71   Bone cancer Father    Stroke Brother    Diabetes Brother    Drug abuse Brother    Breast cancer Maternal Aunt 50   Breast cancer Maternal Aunt 70   Kidney cancer Maternal Grandmother     Social History  Socioeconomic History   Marital status: Significant Other    Spouse name: Not on file   Number of children: 0   Years of education: Not on file   Highest education level: Not on file  Occupational History   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   Tobacco Use   Smoking status: Former    Types: Cigarettes    Quit date: 1990    Years since quitting: 34.0   Smokeless tobacco: Never  Vaping Use   Vaping Use: Never used  Substance and Sexual Activity   Alcohol use: Not Currently    Alcohol/week: 6.0 standard drinks of alcohol    Types: 6 Cans of beer per week    Comment: quit 3-4 years ago   Drug use: Not Currently    Types: Marijuana    Comment: Years ago   Sexual activity: Not Currently  Other Topics  Concern   Not on file  Social History Narrative   Lives at home in private residence with fiance' Joe    Social Determinants of Health   Financial Resource Strain: Low Risk  (11/19/2021)   Overall Financial Resource Strain (CARDIA)    Difficulty of Paying Living Expenses: Not hard at all  Food Insecurity: No Food Insecurity (11/19/2021)   Hunger Vital Sign    Worried About Running Out of Food in the Last Year: Never true    [place] in the Last Year: Never true  Transportation Needs: No Transportation Needs (11/19/2021)   PRAPARE - Hydrologist (Medical): No    Lack of Transportation (Non-Medical): No  Physical Activity: Insufficiently Active (11/19/2021)   Exercise Vital Sign    Days of Exercise per Week: 3 days    Minutes of Exercise per Session: 20 min  Stress: Stress Concern Present (11/19/2021)   [place]    Feeling of Stress : To some extent  Social Connections: Moderately Isolated (11/19/2021)   Social Connection and Isolation Panel [NHANES]    Frequency of Communication with Friends and Family: Three times a week    Frequency of Social Gatherings with Friends and Family: Three times a week    Attends Religious Services: More than 4 times per year    Active Member of Clubs or Organizations: No    Attends Archivist Meetings: Never    Marital Status: Never married  Intimate Partner Violence: Not At Risk (11/19/2021)   Humiliation, Afraid, Rape, and Kick questionnaire    Fear of Current or Ex-Partner: No    Emotionally Abused: No    Physically Abused: No    Sexually Abused: No    Outpatient Medications Prior to Visit  Medication Sig Dispense Refill   acetaminophen (TYLENOL) 500 MG tablet Take 1,000 mg by mouth every 6 (six) hours as needed for moderate pain or headache.     atorvastatin (LIPITOR) 40 MG tablet Take 1 tablet (40 mg total) by mouth daily. 90  tablet 3   calcitRIOL (ROCALTROL) 0.25 MCG capsule Take 1 capsule (0.25 mcg total) by mouth every Tuesday, Thursday, and Saturday at 6 PM. 15 capsule 0   calcium carbonate (OS-CAL - DOSED IN MG OF ELEMENTAL CALCIUM) 1250 (500 Ca) MG tablet Take 1 tablet (1,250 mg total) by mouth daily with breakfast. 30 tablet 0   gabapentin (NEURONTIN) 100 MG capsule Take 100-200 mg by mouth at bedtime.     midodrine (PROAMATINE) 10 MG tablet Take 5 mg  by mouth See admin instructions. Take 1 tablet (5mg ) by mouth 30 minutes before dialysis and take 1 tablet (5mg ) by mouth during dialysis if needed for BP support     multivitamin (RENA-VIT) TABS tablet Take 1 tablet by mouth daily.     promethazine (PHENERGAN) 25 MG tablet Take 25 mg by mouth 2 (two) times daily as needed for nausea or vomiting.     sucroferric oxyhydroxide (VELPHORO) 500 MG chewable tablet Chew 500-1,000 mg by mouth See admin instructions. Take 2 tablets (1000mg ) by mouth three times daily with meals and take 1 tablet (500mg ) by mouth with snacks     No facility-administered medications prior to visit.    No Known Allergies     Objective:    Physical Exam Constitutional:      Appearance: She is well-developed and normal weight.  Eyes:     Comments: Slight yellowing cornea bil    Abdominal:     General: Bowel sounds are decreased. There is distension.     Palpations: Abdomen is soft. There is no hepatomegaly or mass.     Tenderness: There is abdominal tenderness in the right upper quadrant and epigastric area. Positive signs include Murphy's sign.     Comments: Rlq nontender on palpation   Skin:    Comments: Slight yellowing of fingernails bil   Neurological:     Mental Status: She is alert.       BP (!) 118/58   Pulse 97   Ht 5\' 6"  (1.676 m)   Wt 135 lb (61.2 kg)   SpO2 97%   BMI 21.79 kg/m  Wt Readings from Last 3 Encounters:  02/06/22 135 lb (61.2 kg)  11/09/21 136 lb 11 oz (62 kg)  10/10/21 136 lb 2 oz (61.7 kg)      Health Maintenance Due  Topic Date Due   DTaP/Tdap/Td (1 - Tdap) Never done   Zoster Vaccines- Shingrix (1 of 2) Never done   Pneumonia Vaccine 81+ Years old (2 - PPSV23 or PCV20) 09/25/2019   COVID-19 Vaccine (4 - 2023-24 season) 10/03/2021    There are no preventive care reminders to display for this patient.  Lab Results  Component Value Date   TSH 3.50 02/25/2018   Lab Results  Component Value Date   WBC 6.7 11/09/2021   HGB 11.4 (L) 11/09/2021   HCT 34.3 (L) 11/09/2021   MCV 84.9 11/09/2021   PLT 258 11/09/2021   Lab Results  Component Value Date   NA 136 11/10/2021   K 4.5 11/10/2021   CO2 26 11/10/2021   GLUCOSE 105 (H) 11/10/2021   BUN 50 (H) 11/10/2021   CREATININE 9.24 (H) 11/10/2021   BILITOT 0.9 11/09/2021   ALKPHOS 159 (H) 11/09/2021   AST 40 11/09/2021   ALT 28 11/09/2021   PROT 7.9 11/09/2021   ALBUMIN 4.2 11/09/2021   CALCIUM 6.6 (L) 11/10/2021   ANIONGAP 16 (H) 11/10/2021   GFR 4.87 (LL) 10/29/2021   Lab Results  Component Value Date   CHOL 207 (H) 08/04/2021   Lab Results  Component Value Date   HDL 49.20 08/04/2021   Lab Results  Component Value Date   LDLCALC 141 (H) 08/04/2021   Lab Results  Component Value Date   TRIG 87.0 08/04/2021   Lab Results  Component Value Date   CHOLHDL 4 08/04/2021   Lab Results  Component Value Date   HGBA1C 5.2 08/04/2021      Assessment & Plan:   Problem  List Items Addressed This Visit       Other   Right lower quadrant abdominal pain    Not reproducible on exam  However will like to visualize for possible concern/mass/met Ct abd pelvis ordered pending results       Relevant Orders   Lipase   Amylase   CBC   Urine Culture   Comprehensive metabolic panel   Urinalysis, Routine w reflex microscopic   CT ABDOMEN PELVIS W WO CONTRAST   Right upper quadrant abdominal pain with positive Murphy's Sign - Primary    + murphy on exam, jaundice on exam slight  Concern for biliary  obstruction vs cholecystitis vs mets  Ct abd pelvis with contrast ordered stat  Lab workup ordered as well pending results   Advised pt if any worsening pain and or fever go to er and or call 911      Relevant Orders   Lipase   Amylase   CBC   Urine Culture   Comprehensive metabolic panel   Urinalysis, Routine w reflex microscopic   CT ABDOMEN PELVIS W WO CONTRAST   Other Visit Diagnoses     Pelvic pressure in female       Relevant Orders   Urine Culture   Urinalysis, Routine w reflex microscopic       No orders of the defined types were placed in this encounter.   Follow-up: No follow-ups on file.    Eugenia Pancoast, FNP

## 2022-02-06 NOTE — Assessment & Plan Note (Signed)
Not reproducible on exam  However will like to visualize for possible concern/mass/met Ct abd pelvis ordered pending results

## 2022-02-07 DIAGNOSIS — N186 End stage renal disease: Secondary | ICD-10-CM | POA: Diagnosis not present

## 2022-02-07 DIAGNOSIS — D689 Coagulation defect, unspecified: Secondary | ICD-10-CM | POA: Diagnosis not present

## 2022-02-07 DIAGNOSIS — Z992 Dependence on renal dialysis: Secondary | ICD-10-CM | POA: Diagnosis not present

## 2022-02-07 DIAGNOSIS — D508 Other iron deficiency anemias: Secondary | ICD-10-CM | POA: Diagnosis not present

## 2022-02-07 DIAGNOSIS — D509 Iron deficiency anemia, unspecified: Secondary | ICD-10-CM | POA: Diagnosis not present

## 2022-02-07 DIAGNOSIS — N2581 Secondary hyperparathyroidism of renal origin: Secondary | ICD-10-CM | POA: Diagnosis not present

## 2022-02-07 DIAGNOSIS — D631 Anemia in chronic kidney disease: Secondary | ICD-10-CM | POA: Diagnosis not present

## 2022-02-09 NOTE — Progress Notes (Signed)
Right lower lobe pulmonary nodule stable, unchanged.  Cyst in liver still unchanged/stable.  Numerous kidney stones, non concerning per radiologist.   Nothing to explain pain. How is she feeling and how as the weekend? Maybe we got a GI bug.

## 2022-02-10 DIAGNOSIS — D689 Coagulation defect, unspecified: Secondary | ICD-10-CM | POA: Diagnosis not present

## 2022-02-10 DIAGNOSIS — N2581 Secondary hyperparathyroidism of renal origin: Secondary | ICD-10-CM | POA: Diagnosis not present

## 2022-02-10 DIAGNOSIS — D508 Other iron deficiency anemias: Secondary | ICD-10-CM | POA: Diagnosis not present

## 2022-02-10 DIAGNOSIS — N186 End stage renal disease: Secondary | ICD-10-CM | POA: Diagnosis not present

## 2022-02-10 DIAGNOSIS — Z992 Dependence on renal dialysis: Secondary | ICD-10-CM | POA: Diagnosis not present

## 2022-02-10 DIAGNOSIS — D631 Anemia in chronic kidney disease: Secondary | ICD-10-CM | POA: Diagnosis not present

## 2022-02-10 DIAGNOSIS — D509 Iron deficiency anemia, unspecified: Secondary | ICD-10-CM | POA: Diagnosis not present

## 2022-02-12 DIAGNOSIS — D689 Coagulation defect, unspecified: Secondary | ICD-10-CM | POA: Diagnosis not present

## 2022-02-12 DIAGNOSIS — D631 Anemia in chronic kidney disease: Secondary | ICD-10-CM | POA: Diagnosis not present

## 2022-02-12 DIAGNOSIS — D509 Iron deficiency anemia, unspecified: Secondary | ICD-10-CM | POA: Diagnosis not present

## 2022-02-12 DIAGNOSIS — N186 End stage renal disease: Secondary | ICD-10-CM | POA: Diagnosis not present

## 2022-02-12 DIAGNOSIS — D508 Other iron deficiency anemias: Secondary | ICD-10-CM | POA: Diagnosis not present

## 2022-02-12 DIAGNOSIS — N2581 Secondary hyperparathyroidism of renal origin: Secondary | ICD-10-CM | POA: Diagnosis not present

## 2022-02-12 DIAGNOSIS — Z992 Dependence on renal dialysis: Secondary | ICD-10-CM | POA: Diagnosis not present

## 2022-02-14 DIAGNOSIS — D689 Coagulation defect, unspecified: Secondary | ICD-10-CM | POA: Diagnosis not present

## 2022-02-14 DIAGNOSIS — N186 End stage renal disease: Secondary | ICD-10-CM | POA: Diagnosis not present

## 2022-02-14 DIAGNOSIS — N2581 Secondary hyperparathyroidism of renal origin: Secondary | ICD-10-CM | POA: Diagnosis not present

## 2022-02-14 DIAGNOSIS — Z992 Dependence on renal dialysis: Secondary | ICD-10-CM | POA: Diagnosis not present

## 2022-02-14 DIAGNOSIS — D631 Anemia in chronic kidney disease: Secondary | ICD-10-CM | POA: Diagnosis not present

## 2022-02-14 DIAGNOSIS — D508 Other iron deficiency anemias: Secondary | ICD-10-CM | POA: Diagnosis not present

## 2022-02-14 DIAGNOSIS — D509 Iron deficiency anemia, unspecified: Secondary | ICD-10-CM | POA: Diagnosis not present

## 2022-02-17 DIAGNOSIS — N2581 Secondary hyperparathyroidism of renal origin: Secondary | ICD-10-CM | POA: Diagnosis not present

## 2022-02-17 DIAGNOSIS — N186 End stage renal disease: Secondary | ICD-10-CM | POA: Diagnosis not present

## 2022-02-17 DIAGNOSIS — D508 Other iron deficiency anemias: Secondary | ICD-10-CM | POA: Diagnosis not present

## 2022-02-17 DIAGNOSIS — D631 Anemia in chronic kidney disease: Secondary | ICD-10-CM | POA: Diagnosis not present

## 2022-02-17 DIAGNOSIS — D509 Iron deficiency anemia, unspecified: Secondary | ICD-10-CM | POA: Diagnosis not present

## 2022-02-17 DIAGNOSIS — Z992 Dependence on renal dialysis: Secondary | ICD-10-CM | POA: Diagnosis not present

## 2022-02-17 DIAGNOSIS — D689 Coagulation defect, unspecified: Secondary | ICD-10-CM | POA: Diagnosis not present

## 2022-02-18 ENCOUNTER — Ambulatory Visit: Payer: Medicare Other | Admitting: Family

## 2022-02-19 DIAGNOSIS — D689 Coagulation defect, unspecified: Secondary | ICD-10-CM | POA: Diagnosis not present

## 2022-02-19 DIAGNOSIS — N186 End stage renal disease: Secondary | ICD-10-CM | POA: Diagnosis not present

## 2022-02-19 DIAGNOSIS — D508 Other iron deficiency anemias: Secondary | ICD-10-CM | POA: Diagnosis not present

## 2022-02-19 DIAGNOSIS — D509 Iron deficiency anemia, unspecified: Secondary | ICD-10-CM | POA: Diagnosis not present

## 2022-02-19 DIAGNOSIS — Z992 Dependence on renal dialysis: Secondary | ICD-10-CM | POA: Diagnosis not present

## 2022-02-19 DIAGNOSIS — D631 Anemia in chronic kidney disease: Secondary | ICD-10-CM | POA: Diagnosis not present

## 2022-02-19 DIAGNOSIS — N2581 Secondary hyperparathyroidism of renal origin: Secondary | ICD-10-CM | POA: Diagnosis not present

## 2022-02-21 DIAGNOSIS — D689 Coagulation defect, unspecified: Secondary | ICD-10-CM | POA: Diagnosis not present

## 2022-02-21 DIAGNOSIS — Z992 Dependence on renal dialysis: Secondary | ICD-10-CM | POA: Diagnosis not present

## 2022-02-21 DIAGNOSIS — N186 End stage renal disease: Secondary | ICD-10-CM | POA: Diagnosis not present

## 2022-02-21 DIAGNOSIS — D509 Iron deficiency anemia, unspecified: Secondary | ICD-10-CM | POA: Diagnosis not present

## 2022-02-21 DIAGNOSIS — D508 Other iron deficiency anemias: Secondary | ICD-10-CM | POA: Diagnosis not present

## 2022-02-21 DIAGNOSIS — D631 Anemia in chronic kidney disease: Secondary | ICD-10-CM | POA: Diagnosis not present

## 2022-02-21 DIAGNOSIS — N2581 Secondary hyperparathyroidism of renal origin: Secondary | ICD-10-CM | POA: Diagnosis not present

## 2022-02-24 DIAGNOSIS — D509 Iron deficiency anemia, unspecified: Secondary | ICD-10-CM | POA: Diagnosis not present

## 2022-02-24 DIAGNOSIS — Z992 Dependence on renal dialysis: Secondary | ICD-10-CM | POA: Diagnosis not present

## 2022-02-24 DIAGNOSIS — N2581 Secondary hyperparathyroidism of renal origin: Secondary | ICD-10-CM | POA: Diagnosis not present

## 2022-02-24 DIAGNOSIS — D508 Other iron deficiency anemias: Secondary | ICD-10-CM | POA: Diagnosis not present

## 2022-02-24 DIAGNOSIS — D631 Anemia in chronic kidney disease: Secondary | ICD-10-CM | POA: Diagnosis not present

## 2022-02-24 DIAGNOSIS — D689 Coagulation defect, unspecified: Secondary | ICD-10-CM | POA: Diagnosis not present

## 2022-02-24 DIAGNOSIS — N186 End stage renal disease: Secondary | ICD-10-CM | POA: Diagnosis not present

## 2022-02-26 DIAGNOSIS — N186 End stage renal disease: Secondary | ICD-10-CM | POA: Diagnosis not present

## 2022-02-26 DIAGNOSIS — D508 Other iron deficiency anemias: Secondary | ICD-10-CM | POA: Diagnosis not present

## 2022-02-26 DIAGNOSIS — Z992 Dependence on renal dialysis: Secondary | ICD-10-CM | POA: Diagnosis not present

## 2022-02-26 DIAGNOSIS — D689 Coagulation defect, unspecified: Secondary | ICD-10-CM | POA: Diagnosis not present

## 2022-02-26 DIAGNOSIS — D509 Iron deficiency anemia, unspecified: Secondary | ICD-10-CM | POA: Diagnosis not present

## 2022-02-26 DIAGNOSIS — N2581 Secondary hyperparathyroidism of renal origin: Secondary | ICD-10-CM | POA: Diagnosis not present

## 2022-02-26 DIAGNOSIS — D631 Anemia in chronic kidney disease: Secondary | ICD-10-CM | POA: Diagnosis not present

## 2022-02-28 DIAGNOSIS — Z992 Dependence on renal dialysis: Secondary | ICD-10-CM | POA: Diagnosis not present

## 2022-02-28 DIAGNOSIS — D509 Iron deficiency anemia, unspecified: Secondary | ICD-10-CM | POA: Diagnosis not present

## 2022-02-28 DIAGNOSIS — D631 Anemia in chronic kidney disease: Secondary | ICD-10-CM | POA: Diagnosis not present

## 2022-02-28 DIAGNOSIS — N186 End stage renal disease: Secondary | ICD-10-CM | POA: Diagnosis not present

## 2022-02-28 DIAGNOSIS — D508 Other iron deficiency anemias: Secondary | ICD-10-CM | POA: Diagnosis not present

## 2022-02-28 DIAGNOSIS — N2581 Secondary hyperparathyroidism of renal origin: Secondary | ICD-10-CM | POA: Diagnosis not present

## 2022-02-28 DIAGNOSIS — D689 Coagulation defect, unspecified: Secondary | ICD-10-CM | POA: Diagnosis not present

## 2022-03-03 DIAGNOSIS — D509 Iron deficiency anemia, unspecified: Secondary | ICD-10-CM | POA: Diagnosis not present

## 2022-03-03 DIAGNOSIS — D689 Coagulation defect, unspecified: Secondary | ICD-10-CM | POA: Diagnosis not present

## 2022-03-03 DIAGNOSIS — Z992 Dependence on renal dialysis: Secondary | ICD-10-CM | POA: Diagnosis not present

## 2022-03-03 DIAGNOSIS — N186 End stage renal disease: Secondary | ICD-10-CM | POA: Diagnosis not present

## 2022-03-03 DIAGNOSIS — D631 Anemia in chronic kidney disease: Secondary | ICD-10-CM | POA: Diagnosis not present

## 2022-03-03 DIAGNOSIS — D508 Other iron deficiency anemias: Secondary | ICD-10-CM | POA: Diagnosis not present

## 2022-03-03 DIAGNOSIS — N2581 Secondary hyperparathyroidism of renal origin: Secondary | ICD-10-CM | POA: Diagnosis not present

## 2022-03-04 DIAGNOSIS — I129 Hypertensive chronic kidney disease with stage 1 through stage 4 chronic kidney disease, or unspecified chronic kidney disease: Secondary | ICD-10-CM | POA: Diagnosis not present

## 2022-03-04 DIAGNOSIS — N186 End stage renal disease: Secondary | ICD-10-CM | POA: Diagnosis not present

## 2022-03-04 DIAGNOSIS — Z992 Dependence on renal dialysis: Secondary | ICD-10-CM | POA: Diagnosis not present

## 2022-03-05 DIAGNOSIS — Z992 Dependence on renal dialysis: Secondary | ICD-10-CM | POA: Diagnosis not present

## 2022-03-05 DIAGNOSIS — N2581 Secondary hyperparathyroidism of renal origin: Secondary | ICD-10-CM | POA: Diagnosis not present

## 2022-03-05 DIAGNOSIS — N186 End stage renal disease: Secondary | ICD-10-CM | POA: Diagnosis not present

## 2022-03-05 DIAGNOSIS — D689 Coagulation defect, unspecified: Secondary | ICD-10-CM | POA: Diagnosis not present

## 2022-03-07 DIAGNOSIS — N186 End stage renal disease: Secondary | ICD-10-CM | POA: Diagnosis not present

## 2022-03-07 DIAGNOSIS — D689 Coagulation defect, unspecified: Secondary | ICD-10-CM | POA: Diagnosis not present

## 2022-03-07 DIAGNOSIS — Z992 Dependence on renal dialysis: Secondary | ICD-10-CM | POA: Diagnosis not present

## 2022-03-07 DIAGNOSIS — N2581 Secondary hyperparathyroidism of renal origin: Secondary | ICD-10-CM | POA: Diagnosis not present

## 2022-03-08 ENCOUNTER — Encounter (HOSPITAL_COMMUNITY): Payer: Self-pay

## 2022-03-10 ENCOUNTER — Other Ambulatory Visit (INDEPENDENT_AMBULATORY_CARE_PROVIDER_SITE_OTHER): Payer: Self-pay | Admitting: Nurse Practitioner

## 2022-03-10 DIAGNOSIS — D689 Coagulation defect, unspecified: Secondary | ICD-10-CM | POA: Diagnosis not present

## 2022-03-10 DIAGNOSIS — Z992 Dependence on renal dialysis: Secondary | ICD-10-CM | POA: Diagnosis not present

## 2022-03-10 DIAGNOSIS — N186 End stage renal disease: Secondary | ICD-10-CM | POA: Diagnosis not present

## 2022-03-10 DIAGNOSIS — N2581 Secondary hyperparathyroidism of renal origin: Secondary | ICD-10-CM | POA: Diagnosis not present

## 2022-03-11 DIAGNOSIS — T829XXA Unspecified complication of cardiac and vascular prosthetic device, implant and graft, initial encounter: Secondary | ICD-10-CM | POA: Insufficient documentation

## 2022-03-11 NOTE — Progress Notes (Deleted)
MRN : [id number]  Kimberly Frost is a 72 y.o. ([birth year]) female who presents with chief complaint of check access.  History of Present Illness:   The patient returns to the office for followup status post intervention of their dialysis access 06/25/2021.    Following the intervention the access function has significantly improved, with better flow rates and improved KT/V. The patient has not been experiencing increased bleeding times following decannulation and the patient denies increased recirculation. The patient denies an increase in arm swelling. At the present time the patient denies hand pain.   No recent shortening of the patient's walking distance or new symptoms consistent with claudication.  No history of rest pain symptoms. No new ulcers or wounds of the lower extremities have occurred.   The patient denies amaurosis fugax or recent TIA symptoms. There are no recent neurological changes noted. There is no history of DVT, PE or superficial thrombophlebitis. No recent episodes of angina or shortness of breath documented.    Duplex ultrasound of the AV access shows a patent access.  The previously noted stenosis is improved compared to last study.  Flow volume today is 1942 cc/min (previous flow volume was 905 cc/min)  No outpatient medications have been marked as taking for the 03/16/22 encounter (Appointment) with Delana Meyer, Dolores Lory, MD.    Past Medical History:  Diagnosis Date   Chronic kidney disease    Edema    RLE   Hepatitis    Hep C   History of blood transfusion    History of radiation therapy    right lung 02/24/2021-03/10/2021  Dr Gery Pray   Hypertension    Lung cancer [facility])    Wears dentures     Past Surgical History:  Procedure Laterality Date   A/V FISTULAGRAM Left 04/11/2019   Procedure: A/V FISTULAGRAM;  Surgeon: Katha Cabal, MD;  Location: [place] [facility];  Service: Cardiovascular;  Laterality: Left;   A/V  FISTULAGRAM Left 05/01/2019   Procedure: A/V FISTULAGRAM;  Surgeon: Algernon Huxley, MD;  Location: [place] [facility];  Service: Cardiovascular;  Laterality: Left;   A/V FISTULAGRAM Left 01/15/2020   Procedure: A/V FISTULAGRAM;  Surgeon: Algernon Huxley, MD;  Location: [place] [facility];  Service: Cardiovascular;  Laterality: Left;   A/V FISTULAGRAM Left 04/22/2020   Procedure: A/V FISTULAGRAM;  Surgeon: Algernon Huxley, MD;  Location: [place] [facility];  Service: Cardiovascular;  Laterality: Left;   A/V FISTULAGRAM Left 06/25/2021   Procedure: A/V Fistulagram;  Surgeon: Katha Cabal, MD;  Location: [place] [facility];  Service: Cardiovascular;  Laterality: Left;   AV FISTULA PLACEMENT Left 06/16/2018   Procedure: Left Arm ARTERIOVENOUS (AV) FISTULA CREATION;  Surgeon: Marty Heck, MD;  Location: [facility];  Service: Vascular;  Laterality: Left;   MULTIPLE TOOTH EXTRACTIONS     MYOMECTOMY      Social History Social History   Tobacco Use   Smoking status: Former    Types: Cigarettes    Quit date: 1990    Years since quitting: 34.1   Smokeless tobacco: Never  Vaping Use   Vaping Use: Never used  Substance Use Topics   Alcohol use: Not Currently    Alcohol/week: 6.0 standard drinks of alcohol    Types: 6 Cans of beer per week    Comment: quit 3-4 years  ago   Drug use: Not Currently    Types: Marijuana    Comment: Years ago    Family History Family History  Problem Relation Age of Onset   Diabetes Mother    Hypertension Mother    Breast cancer Mother 60   Bone cancer Father    Stroke Brother    Diabetes Brother    Drug abuse Brother    Breast cancer Maternal Aunt 76   Breast cancer Maternal Aunt 70   Kidney cancer Maternal Grandmother     No Known Allergies   REVIEW OF SYSTEMS (Negative unless checked)  Constitutional: [] Weight loss  [] Fever  [] Chills Cardiac: [] Chest pain   [] Chest pressure   [] Palpitations   [] Shortness of breath when laying flat    [] Shortness of breath with exertion. Vascular:  [] Pain in legs with walking   [] Pain in legs at rest  [] History of DVT   [] Phlebitis   [] Swelling in legs   [] Varicose veins   [] Non-healing ulcers Pulmonary:   [] Uses home oxygen   [] Productive cough   [] Hemoptysis   [] Wheeze  [] COPD   [] Asthma Neurologic:  [] Dizziness   [] Seizures   [] History of stroke   [] History of TIA  [] Aphasia   [] Vissual changes   [] Weakness or numbness in arm   [] Weakness or numbness in leg Musculoskeletal:   [] Joint swelling   [] Joint pain   [] Low back pain Hematologic:  [] Easy bruising  [] Easy bleeding   [] Hypercoagulable state   [] Anemic Gastrointestinal:  [] Diarrhea   [] Vomiting  [] Gastroesophageal reflux/heartburn   [] Difficulty swallowing. Genitourinary:  [x] Chronic kidney disease   [] Difficult urination  [] Frequent urination   [] Blood in urine Skin:  [] Rashes   [] Ulcers  Psychological:  [] History of anxiety   []  History of major depression.  Physical Examination  There were no vitals filed for this visit. There is no height or weight on file to calculate BMI. Gen: WD/WN, NAD Head: /AT, No temporalis wasting.  Ear/Nose/Throat: Hearing grossly intact, nares w/o erythema or drainage Eyes: PER, EOMI, sclera nonicteric.  Neck: Supple, no gross masses or lesions.  No JVD.  Pulmonary:  Good air movement, no audible wheezing, no use of accessory muscles.  Cardiac: RRR, precordium non-hyperdynamic. Vascular:   *** Vessel Right Left  Radial Palpable Palpable  Brachial Palpable Palpable  Gastrointestinal: soft, non-distended. No guarding/no peritoneal signs.  Musculoskeletal: M/S 5/5 throughout.  No deformity.  Neurologic: CN 2-12 intact. Pain and light touch intact in extremities.  Symmetrical.  Speech is fluent. Motor exam as listed above. Psychiatric: Judgment intact, Mood & affect appropriate for pt's clinical situation. Dermatologic: No rashes or ulcers noted.  No changes consistent with  cellulitis.   CBC Lab Results  Component Value Date   WBC 6.7 11/09/2021   HGB 11.4 (L) 11/09/2021   HCT 34.3 (L) 11/09/2021   MCV 84.9 11/09/2021   PLT 258 11/09/2021    BMET    Component Value Date/Time   NA 136 11/10/2021 0454   NA 138 06/23/2018 0000   K 4.5 11/10/2021 0454   K 4.9 06/23/2018 0000   CL 94 (L) 11/10/2021 0454   CO2 26 11/10/2021 0454   GLUCOSE 105 (H) 11/10/2021 0454   BUN 50 (H) 11/10/2021 0454   BUN 61 06/23/2018 0000   CREATININE 9.24 (H) 11/10/2021 0454   CREATININE 7.13 06/23/2018 0000   CALCIUM 6.6 (L) 11/10/2021 0454   CALCIUM 8.8 06/23/2018 0000   GFRNONAA 4 (L) 11/10/2021 0454   GFRAA 6  06/23/2018 0000   CrCl cannot be calculated (Patient's most recent lab result is older than the maximum 21 days allowed.).  COAG No results found for: "INR", "PROTIME"  Radiology No results found.   Assessment/Plan There are no diagnoses linked to this encounter.   Hortencia Pilar, MD  03/11/2022 5:47 PM

## 2022-03-12 DIAGNOSIS — Z992 Dependence on renal dialysis: Secondary | ICD-10-CM | POA: Diagnosis not present

## 2022-03-12 DIAGNOSIS — N2581 Secondary hyperparathyroidism of renal origin: Secondary | ICD-10-CM | POA: Diagnosis not present

## 2022-03-12 DIAGNOSIS — D689 Coagulation defect, unspecified: Secondary | ICD-10-CM | POA: Diagnosis not present

## 2022-03-12 DIAGNOSIS — N186 End stage renal disease: Secondary | ICD-10-CM | POA: Diagnosis not present

## 2022-03-13 ENCOUNTER — Encounter (HOSPITAL_COMMUNITY): Payer: Self-pay

## 2022-03-13 ENCOUNTER — Telehealth: Payer: Self-pay | Admitting: *Deleted

## 2022-03-13 NOTE — Telephone Encounter (Signed)
Returned patient's phone call, spoke with patient 

## 2022-03-14 DIAGNOSIS — Z992 Dependence on renal dialysis: Secondary | ICD-10-CM | POA: Diagnosis not present

## 2022-03-14 DIAGNOSIS — N2581 Secondary hyperparathyroidism of renal origin: Secondary | ICD-10-CM | POA: Diagnosis not present

## 2022-03-14 DIAGNOSIS — N186 End stage renal disease: Secondary | ICD-10-CM | POA: Diagnosis not present

## 2022-03-14 DIAGNOSIS — D689 Coagulation defect, unspecified: Secondary | ICD-10-CM | POA: Diagnosis not present

## 2022-03-15 NOTE — Progress Notes (Signed)
Radiation Oncology         ([phone]) [phone] ________________________________  Name: Kimberly Frost MRN: [id number]  Date: 03/16/2022  DOB: 05/14/1950  Follow-Up Visit Note  CC: Eugenia Pancoast, FNP  [name], MD  No diagnosis found.  Diagnosis: The primary encounter diagnosis was NSCLC of right lung ([place]). A diagnosis of NSCLC of lower lobe ([place]) was also pertinent to this visit.   Stage IB (cT2, N0, M0) NSCLC of the RUL  Interval Since Last Radiation: 6 months and 19 days   Intent: Curative  Radiation Treatment Dates: 08/12/2021 through 08/25/2021 Site Technique Total Dose (Gy) Dose per Fx (Gy) Completed Fx Beam Energies  Lung, Right: Lung_R IMRT 50/50 5 10/10 [id number]   Narrative:  The patient returns today for routine follow-up. The patient was not seen for a routine 1 month post-radiation follow-up visit. She tolerated radiation therapy well overall without any significant side effects. She did endorse some mild fatigue, cough and SOB which were related to dialysis, not radiation therapy.       Since completing radiation therapy, the patient presented to the ED on 11/09/21 with progressive/worsening generalized weakness. She also endorsed cramping, dizziness, and several near-fall episodes. Clinically, her presentation was though to correlate with an electrolyte abnormality in the setting of end-stage renal disease. Labs collected were significant for hypocalcemia on BMP which was new and correlated with her muscle spasms, perioral anesthesia, and prolonged Qtc which was uncovered during ED work-up. Other work-up included a chest x-ray and CT of the head which were both negative. Hospital course included IV calcium and she was admitted in the setting of her prolonged QT. Nephrology was also consulted and recommended oral calcitriol and calcium replacement.                   The patient also presented to her PCP last month with right sided abdominal pain x 3 days, decreased appetite  w/ weight loss, and nausea. Exam performed by her PCP was positive for Murphy's sign, and the patient appeared to be jaundiced. Her symptoms prompted a CT of the abdomen and pelvis with contrast on 02/06/22 which demonstrated no acute findings in the abdomen or pelvis / findings to correlate with her right-sided abdominal pain. CT also showed stability of the nonspecific RUL nodule, measuring 5 mm, and no evidence of lymphadenopathy or metastatic disease in the abdomen or pelvis.  Other pertinent imaging performed in the interval since she was last seen for follow-up in June includes:  -- DXA for bone mineral density on 10/03/21 which showed a right femoral t-score of -3.0, classifying the patient as osteoporotic.  -- Bilateral screening mammogram on 10/03/21 showed no evidence of malignancy in either breast.   ***  Allergies:  has No Known Allergies.  Meds: Current Outpatient Medications  Medication Sig Dispense Refill   acetaminophen (TYLENOL) 500 MG tablet Take 1,000 mg by mouth every 6 (six) hours as needed for moderate pain or headache.     atorvastatin (LIPITOR) 40 MG tablet Take 1 tablet (40 mg total) by mouth daily. 90 tablet 3   calcitRIOL (ROCALTROL) 0.25 MCG capsule Take 1 capsule (0.25 mcg total) by mouth every Tuesday, Thursday, and Saturday at 6 PM. 15 capsule 0   calcium carbonate (OS-CAL - DOSED IN MG OF ELEMENTAL CALCIUM) 1250 (500 Ca) MG tablet Take 1 tablet (1,250 mg total) by mouth daily with breakfast. 30 tablet 0   gabapentin (NEURONTIN) 100 MG capsule Take 100-200 mg by  mouth at bedtime.     midodrine (PROAMATINE) 10 MG tablet Take 5 mg by mouth See admin instructions. Take 1 tablet (5mg ) by mouth 30 minutes before dialysis and take 1 tablet (5mg ) by mouth during dialysis if needed for BP support     multivitamin (RENA-VIT) TABS tablet Take 1 tablet by mouth daily.     promethazine (PHENERGAN) 25 MG tablet Take 25 mg by mouth 2 (two) times daily as needed for nausea or  vomiting.     sucroferric oxyhydroxide (VELPHORO) 500 MG chewable tablet Chew 500-1,000 mg by mouth See admin instructions. Take 2 tablets (1000mg ) by mouth three times daily with meals and take 1 tablet (500mg ) by mouth with snacks     No current facility-administered medications for this encounter.    Physical Findings: The patient is in no acute distress. Patient is alert and oriented.  vitals were not taken for this visit. .  No significant changes. Lungs are clear to auscultation bilaterally. Heart has regular rate and rhythm. No palpable cervical, supraclavicular, or axillary adenopathy. Abdomen soft, non-tender, normal bowel sounds.   Lab Findings: Lab Results  Component Value Date   WBC 6.7 11/09/2021   HGB 11.4 (L) 11/09/2021   HCT 34.3 (L) 11/09/2021   MCV 84.9 11/09/2021   PLT 258 11/09/2021    Radiographic Findings: No results found.  Impression: The primary encounter diagnosis was NSCLC of right lung ([place]). A diagnosis of NSCLC of lower lobe ([place]) was also pertinent to this visit.   Stage IB (cT2, N0, M0) NSCLC of the RUL  The patient is recovering from the effects of radiation.  ***  Plan:  ***   *** minutes of total time was spent for this patient encounter, including preparation, face-to-face counseling with the patient and coordination of care, physical exam, and documentation of the encounter. ____________________________________  Blair Promise, PhD, MD  This document serves as a record of services personally performed by Gery Pray, MD. It was created on his behalf by Roney Mans, a trained medical scribe. The creation of this record is based on the scribe's personal observations and the provider's statements to them. This document has been checked and approved by the attending provider.

## 2022-03-16 ENCOUNTER — Encounter: Payer: Self-pay | Admitting: Radiation Oncology

## 2022-03-16 ENCOUNTER — Ambulatory Visit (INDEPENDENT_AMBULATORY_CARE_PROVIDER_SITE_OTHER): Payer: 59 | Admitting: Vascular Surgery

## 2022-03-16 ENCOUNTER — Ambulatory Visit
Admission: RE | Admit: 2022-03-16 | Discharge: 2022-03-16 | Disposition: A | Discharge status: Home / Self Care | Payer: 59 | Source: Ambulatory Visit | Attending: Radiation Oncology | Admitting: Radiation Oncology

## 2022-03-16 ENCOUNTER — Ambulatory Visit (INDEPENDENT_AMBULATORY_CARE_PROVIDER_SITE_OTHER): Payer: Medicare Other

## 2022-03-16 VITALS — BP 134/70 | HR 88 | Temp 97.9°F | Resp 20 | Ht 66.0 in | Wt 138.4 lb

## 2022-03-16 DIAGNOSIS — C3411 Malignant neoplasm of upper lobe, right bronchus or lung: Secondary | ICD-10-CM | POA: Diagnosis not present

## 2022-03-16 DIAGNOSIS — Z85118 Personal history of other malignant neoplasm of bronchus and lung: Secondary | ICD-10-CM | POA: Insufficient documentation

## 2022-03-16 DIAGNOSIS — Z79899 Other long term (current) drug therapy: Secondary | ICD-10-CM | POA: Diagnosis not present

## 2022-03-16 DIAGNOSIS — E785 Hyperlipidemia, unspecified: Secondary | ICD-10-CM

## 2022-03-16 DIAGNOSIS — C3430 Malignant neoplasm of lower lobe, unspecified bronchus or lung: Secondary | ICD-10-CM

## 2022-03-16 DIAGNOSIS — N186 End stage renal disease: Secondary | ICD-10-CM

## 2022-03-16 DIAGNOSIS — T829XXS Unspecified complication of cardiac and vascular prosthetic device, implant and graft, sequela: Secondary | ICD-10-CM

## 2022-03-16 DIAGNOSIS — Z923 Personal history of irradiation: Secondary | ICD-10-CM | POA: Insufficient documentation

## 2022-03-16 DIAGNOSIS — C3491 Malignant neoplasm of unspecified part of right bronchus or lung: Secondary | ICD-10-CM

## 2022-03-16 DIAGNOSIS — I10 Essential (primary) hypertension: Secondary | ICD-10-CM

## 2022-03-16 DIAGNOSIS — Z87891 Personal history of nicotine dependence: Secondary | ICD-10-CM | POA: Diagnosis not present

## 2022-03-16 NOTE — Progress Notes (Signed)
Kimberly Frost is here today for follow up post radiation to the lung.  Lung Side: Right, patient completed treatment on 08/25/21.  Does the patient complain of any of the following: Pain:no Shortness of breath w/wo exertion: none Cough: sometime Hemoptysis: none Pain with swallowing: none Swallowing/choking concerns: none Appetite: good Energy Level: varies Post radiation skin Changes: none    Additional comments if applicable:  Vitals:   19/50/93 1002  BP: 134/70  Pulse: 88  Resp: 20  Temp: 97.9 F (36.6 C)  SpO2: 99%  Weight: 62.8 kg  Height: 5\' 6"  (1.676 m)

## 2022-03-16 NOTE — Progress Notes (Incomplete)
  Radiation Oncology         [phone] ________________________________  Patient Name: [name] MRN: [id number] DOB: 09-12-1950 Referring Physician: [name] Date of Service: 08/25/2021 [facility], [employer]                                                        End Of Treatment Note  Diagnoses: R91.8-Other nonspecific abnormal finding of lung field  Cancer Staging: The primary encounter diagnosis was NSCLC of right lung ([place]). A diagnosis of NSCLC of lower lobe ([place]) was also pertinent to this visit.   Stage IB (cT2, N0, M0) NSCLC of the RUL  Intent: Curative  Radiation Treatment Dates: 08/12/2021 through 08/25/2021 Site Technique Total Dose (Gy) Dose per Fx (Gy) Completed Fx Beam Energies  Lung, Right: Lung_R IMRT 50/50 5 10/10 6XFFF   Narrative: The patient tolerated radiation therapy well overall without any significant side effects. She did endorse some mild fatigue, cough and SOB which were related to dialysis, not radiation therapy.   Plan: The patient will follow-up with radiation oncology in one month .  ________________________________________________ -----------------------------------  Blair Promise, PhD, MD  This document serves as a record of services personally performed by Gery Pray, MD. It was created on his behalf by Roney Mans, a trained medical scribe. The creation of this record is based on the scribe's personal observations and the provider's statements to them. This document has been checked and approved by the attending provider.

## 2022-03-17 DIAGNOSIS — N2581 Secondary hyperparathyroidism of renal origin: Secondary | ICD-10-CM | POA: Diagnosis not present

## 2022-03-17 DIAGNOSIS — N186 End stage renal disease: Secondary | ICD-10-CM | POA: Diagnosis not present

## 2022-03-17 DIAGNOSIS — Z992 Dependence on renal dialysis: Secondary | ICD-10-CM | POA: Diagnosis not present

## 2022-03-17 DIAGNOSIS — D689 Coagulation defect, unspecified: Secondary | ICD-10-CM | POA: Diagnosis not present

## 2022-03-19 DIAGNOSIS — D689 Coagulation defect, unspecified: Secondary | ICD-10-CM | POA: Diagnosis not present

## 2022-03-19 DIAGNOSIS — N186 End stage renal disease: Secondary | ICD-10-CM | POA: Diagnosis not present

## 2022-03-19 DIAGNOSIS — N2581 Secondary hyperparathyroidism of renal origin: Secondary | ICD-10-CM | POA: Diagnosis not present

## 2022-03-19 DIAGNOSIS — Z992 Dependence on renal dialysis: Secondary | ICD-10-CM | POA: Diagnosis not present

## 2022-03-21 DIAGNOSIS — N2581 Secondary hyperparathyroidism of renal origin: Secondary | ICD-10-CM | POA: Diagnosis not present

## 2022-03-21 DIAGNOSIS — Z992 Dependence on renal dialysis: Secondary | ICD-10-CM | POA: Diagnosis not present

## 2022-03-21 DIAGNOSIS — D689 Coagulation defect, unspecified: Secondary | ICD-10-CM | POA: Diagnosis not present

## 2022-03-21 DIAGNOSIS — N186 End stage renal disease: Secondary | ICD-10-CM | POA: Diagnosis not present

## 2022-03-24 DIAGNOSIS — N2581 Secondary hyperparathyroidism of renal origin: Secondary | ICD-10-CM | POA: Diagnosis not present

## 2022-03-24 DIAGNOSIS — Z992 Dependence on renal dialysis: Secondary | ICD-10-CM | POA: Diagnosis not present

## 2022-03-24 DIAGNOSIS — D689 Coagulation defect, unspecified: Secondary | ICD-10-CM | POA: Diagnosis not present

## 2022-03-24 DIAGNOSIS — N186 End stage renal disease: Secondary | ICD-10-CM | POA: Diagnosis not present

## 2022-03-26 DIAGNOSIS — D689 Coagulation defect, unspecified: Secondary | ICD-10-CM | POA: Diagnosis not present

## 2022-03-26 DIAGNOSIS — N2581 Secondary hyperparathyroidism of renal origin: Secondary | ICD-10-CM | POA: Diagnosis not present

## 2022-03-26 DIAGNOSIS — N186 End stage renal disease: Secondary | ICD-10-CM | POA: Diagnosis not present

## 2022-03-26 DIAGNOSIS — Z992 Dependence on renal dialysis: Secondary | ICD-10-CM | POA: Diagnosis not present

## 2022-03-28 DIAGNOSIS — N2581 Secondary hyperparathyroidism of renal origin: Secondary | ICD-10-CM | POA: Diagnosis not present

## 2022-03-28 DIAGNOSIS — D689 Coagulation defect, unspecified: Secondary | ICD-10-CM | POA: Diagnosis not present

## 2022-03-28 DIAGNOSIS — N186 End stage renal disease: Secondary | ICD-10-CM | POA: Diagnosis not present

## 2022-03-28 DIAGNOSIS — Z992 Dependence on renal dialysis: Secondary | ICD-10-CM | POA: Diagnosis not present

## 2022-03-30 NOTE — Progress Notes (Signed)
Kimberly Frost is here today for follow up post radiation to the lung.  Lung Side: Right, patient completed treatment on 08/25/21.  Does the patient complain of any of the following: Pain:no Shortness of breath w/wo exertion: none Cough: sometime Hemoptysis: none Pain with swallowing: none Swallowing/choking concerns: none Appetite: good Energy Level: varies Post radiation skin Changes: none    Additional comments if applicable:  BP (!) [id number] (BP Location: Right Arm, Patient Position: Sitting, Cuff Size: Normal)   Pulse 95   Temp (!) 97.5 F (36.4 C)   Resp 20   Ht '5\' 6"'$  (1.676 m)   Wt 136 lb (61.7 kg)   SpO2 100%   BMI 21.95 kg/m

## 2022-03-31 DIAGNOSIS — N2581 Secondary hyperparathyroidism of renal origin: Secondary | ICD-10-CM | POA: Diagnosis not present

## 2022-03-31 DIAGNOSIS — N186 End stage renal disease: Secondary | ICD-10-CM | POA: Diagnosis not present

## 2022-03-31 DIAGNOSIS — D689 Coagulation defect, unspecified: Secondary | ICD-10-CM | POA: Diagnosis not present

## 2022-03-31 DIAGNOSIS — Z992 Dependence on renal dialysis: Secondary | ICD-10-CM | POA: Diagnosis not present

## 2022-04-02 DIAGNOSIS — N186 End stage renal disease: Secondary | ICD-10-CM | POA: Diagnosis not present

## 2022-04-02 DIAGNOSIS — I129 Hypertensive chronic kidney disease with stage 1 through stage 4 chronic kidney disease, or unspecified chronic kidney disease: Secondary | ICD-10-CM | POA: Diagnosis not present

## 2022-04-02 DIAGNOSIS — N2581 Secondary hyperparathyroidism of renal origin: Secondary | ICD-10-CM | POA: Diagnosis not present

## 2022-04-02 DIAGNOSIS — D689 Coagulation defect, unspecified: Secondary | ICD-10-CM | POA: Diagnosis not present

## 2022-04-02 DIAGNOSIS — Z992 Dependence on renal dialysis: Secondary | ICD-10-CM | POA: Diagnosis not present

## 2022-04-03 ENCOUNTER — Ambulatory Visit (HOSPITAL_COMMUNITY)
Admission: RE | Admit: 2022-04-03 | Discharge: 2022-04-03 | Disposition: A | Discharge status: Home / Self Care | Payer: 59 | Source: Ambulatory Visit | Attending: Radiation Oncology | Admitting: Radiation Oncology

## 2022-04-03 DIAGNOSIS — C3491 Malignant neoplasm of unspecified part of right bronchus or lung: Secondary | ICD-10-CM | POA: Diagnosis not present

## 2022-04-03 DIAGNOSIS — J439 Emphysema, unspecified: Secondary | ICD-10-CM | POA: Diagnosis not present

## 2022-04-03 DIAGNOSIS — R918 Other nonspecific abnormal finding of lung field: Secondary | ICD-10-CM | POA: Diagnosis not present

## 2022-04-03 DIAGNOSIS — C3490 Malignant neoplasm of unspecified part of unspecified bronchus or lung: Secondary | ICD-10-CM | POA: Diagnosis not present

## 2022-04-04 DIAGNOSIS — N2581 Secondary hyperparathyroidism of renal origin: Secondary | ICD-10-CM | POA: Diagnosis not present

## 2022-04-04 DIAGNOSIS — N186 End stage renal disease: Secondary | ICD-10-CM | POA: Diagnosis not present

## 2022-04-04 DIAGNOSIS — Z992 Dependence on renal dialysis: Secondary | ICD-10-CM | POA: Diagnosis not present

## 2022-04-04 DIAGNOSIS — D689 Coagulation defect, unspecified: Secondary | ICD-10-CM | POA: Diagnosis not present

## 2022-04-05 NOTE — Progress Notes (Signed)
Radiation Oncology         [phone] ________________________________  Name: Kimberly Frost MRN: [id number]  Date: 04/06/2022  DOB: 1951-01-01  Follow-Up Visit Note  CC: Eugenia Pancoast, FNP  [name], MD    ICD-10-CM   1. NSCLC of right lung ([place])  C34.91 CT CHEST WO CONTRAST      Diagnosis:  The primary encounter diagnosis was NSCLC of right lung ([place]). A diagnosis of NSCLC of lower lobe ([place]) was also pertinent to this visit.   Stage IB (cT2, N0, M0) NSCLC of the RUL  Interval Since Last Radiation: 7 months and 9 days   Intent: Curative  Radiation Treatment Dates: 08/12/2021 through 08/25/2021 Site Technique Total Dose (Gy) Dose per Fx (Gy) Completed Fx Beam Energies  Lung, Right: Lung_R IMRT 50/50 5 10/10 6XFFF    Narrative:  The patient returns today for routine follow-up and to review recent routine imaging. She was last seen here for follow-up on 03/16/22. Her most recent chest CT without contrast on 04/03/22 demonstrates a decrease in size of the dominant hypermetabolic lesion in the right upper lobe, along with a decrease in size of the more central right suprahilar nodule. CT also shows progressive treatment changes in the right upper lobe, no new or enlarging nodules, and no evidence of progressive metastatic disease.       On evaluation today the patient denies any changes in her breathing.  She denies any significant cough hemoptysis or shortness of breath.  She continues on dialysis 3 times a week.  Allergies:  has No Known Allergies.  Meds: Current Outpatient Medications  Medication Sig Dispense Refill   acetaminophen (TYLENOL) 500 MG tablet Take 1,000 mg by mouth every 6 (six) hours as needed for moderate pain or headache.     atorvastatin (LIPITOR) 40 MG tablet Take 1 tablet (40 mg total) by mouth daily. 90 tablet 3   calcitRIOL (ROCALTROL) 0.25 MCG capsule Take 1 capsule (0.25 mcg total) by mouth every Tuesday, Thursday, and Saturday at 6 PM. 15  capsule 0   gabapentin (NEURONTIN) 100 MG capsule Take 100-200 mg by mouth at bedtime.     midodrine (PROAMATINE) 10 MG tablet Take 5 mg by mouth See admin instructions. Take 1 tablet ('5mg'$ ) by mouth 30 minutes before dialysis and take 1 tablet ('5mg'$ ) by mouth during dialysis if needed for BP support     multivitamin (RENA-VIT) TABS tablet Take 1 tablet by mouth daily.     promethazine (PHENERGAN) 25 MG tablet Take 25 mg by mouth 2 (two) times daily as needed for nausea or vomiting.     sucroferric oxyhydroxide (VELPHORO) 500 MG chewable tablet Chew 500-1,000 mg by mouth See admin instructions. Take 2 tablets ('1000mg'$ ) by mouth three times daily with meals and take 1 tablet ('500mg'$ ) by mouth with snacks     calcium carbonate (OS-CAL - DOSED IN MG OF ELEMENTAL CALCIUM) 1250 (500 Ca) MG tablet Take 1 tablet (1,250 mg total) by mouth daily with breakfast. (Patient not taking: Reported on 03/16/2022) 30 tablet 0   No current facility-administered medications for this encounter.    Physical Findings: The patient is in no acute distress. Patient is alert and oriented.  height is '5\' 6"'$  (1.676 m) and weight is 136 lb (61.7 kg). Her temperature is 97.5 F (36.4 C) (abnormal). Her blood pressure is 148/75 (abnormal) and her pulse is 95. Her respiration is 20 and oxygen saturation is 100%. .  . Lungs are clear  to auscultation bilaterally. Heart has regular rate and rhythm. No palpable cervical, supraclavicular, or axillary adenopathy. Abdomen soft, non-tender, normal bowel sounds.   Lab Findings: Lab Results  Component Value Date   WBC 6.7 11/09/2021   HGB 11.4 (L) 11/09/2021   HCT 34.3 (L) 11/09/2021   MCV 84.9 11/09/2021   PLT 258 11/09/2021    Radiographic Findings: CT CHEST WO CONTRAST  Result Date: 04/03/2022 CLINICAL DATA:  Non-small cell lung cancer, assess treatment response. * Tracking Code: BO * EXAM: CT CHEST WITHOUT CONTRAST TECHNIQUE: Multidetector CT imaging of the chest was performed  following the standard protocol without IV contrast. RADIATION DOSE REDUCTION: This exam was performed according to the departmental dose-optimization program which includes automated exposure control, adjustment of the mA and/or kV according to patient size and/or use of iterative reconstruction technique. COMPARISON:  Abdominopelvic CT 02/06/2022. Chest CT 07/09/2021 and PET-CT 07/21/2021. FINDINGS: Cardiovascular: Again demonstrated is extensive atherosclerosis of the aorta, great vessels and coronary arteries. There is stable borderline dilatation of the ascending aorta to 4.0 cm. No acute vascular findings identified on noncontrast imaging. There are probable calcifications of the aortic valve. The heart size is normal. There is no pericardial effusion. Mediastinum/Nodes: There are no enlarged mediastinal, hilar or axillary lymph nodes. The thyroid gland, trachea and esophagus demonstrate no significant findings. Lungs/Pleura: No pleural effusion or pneumothorax. Progressive treatment changes in the right upper lobe with associated volume loss. The dominant hypermetabolic lesion previously demonstrated peripherally has decreased in size, now measuring 1.4 x 1.1 cm on image 30/7 (previously 2.6 x 2.4 cm). The more central right suprahilar nodule is now partly obscured by treatment changes, although also appears smaller, measuring approximately 1.4 cm maximally on image 37/7 (previously 2.0 cm). Unchanged 5 mm right lower lobe nodule on image 107/7. No new or enlarging nodules are identified. Mild centrilobular and paraseptal emphysema. Upper abdomen: The visualized upper abdomen appears stable, without significant findings. Musculoskeletal/Chest wall: There is no chest wall mass or suspicious osseous finding. IMPRESSION: 1. Progressive treatment changes in the right upper lobe with decreased size of the dominant hypermetabolic lesion previously demonstrated peripherally. The more central right suprahilar nodule  has also decreased in size. 2. No new or enlarging nodules. No evidence of progressive metastatic disease. 3.  Aortic Atherosclerosis (ICD10-I70.0). Electronically Signed   By: Richardean Sale M.D.   On: 04/03/2022 09:39    Impression:  The primary encounter diagnosis was NSCLC of right lung ([place]). A diagnosis of NSCLC of lower lobe ([place]) was also pertinent to this visit.   Stage IB (cT2, N0, M0) NSCLC of the RUL  No evidence of recurrence on clinical exam today.  Recent chest CT scan favorable.  Plan: Routine follow-up in August.  She will have her chest CT scan prior to this follow-up appointment.    25 minutes of total time was spent for this patient encounter, including preparation, face-to-face counseling with the patient and coordination of care, physical exam, and documentation of the encounter. ____________________________________  Blair Promise, PhD, MD  This document serves as a record of services personally performed by Gery Pray, MD. It was created on his behalf by Roney Mans, a trained medical scribe. The creation of this record is based on the scribe's personal observations and the provider's statements to them. This document has been checked and approved by the attending provider.

## 2022-04-06 ENCOUNTER — Ambulatory Visit
Admission: RE | Admit: 2022-04-06 | Discharge: 2022-04-06 | Disposition: A | Discharge status: Home / Self Care | Payer: 59 | Source: Ambulatory Visit | Attending: Radiation Oncology | Admitting: Radiation Oncology

## 2022-04-06 ENCOUNTER — Encounter: Payer: Self-pay | Admitting: Radiation Oncology

## 2022-04-06 ENCOUNTER — Other Ambulatory Visit: Payer: Self-pay | Admitting: Family

## 2022-04-06 VITALS — BP 148/75 | HR 95 | Temp 97.5°F | Resp 20 | Ht 66.0 in | Wt 136.0 lb

## 2022-04-06 DIAGNOSIS — Z79899 Other long term (current) drug therapy: Secondary | ICD-10-CM | POA: Diagnosis not present

## 2022-04-06 DIAGNOSIS — M810 Age-related osteoporosis without current pathological fracture: Secondary | ICD-10-CM

## 2022-04-06 DIAGNOSIS — C3411 Malignant neoplasm of upper lobe, right bronchus or lung: Secondary | ICD-10-CM | POA: Diagnosis not present

## 2022-04-06 DIAGNOSIS — C3491 Malignant neoplasm of unspecified part of right bronchus or lung: Secondary | ICD-10-CM

## 2022-04-06 DIAGNOSIS — Z87891 Personal history of nicotine dependence: Secondary | ICD-10-CM | POA: Diagnosis not present

## 2022-04-06 NOTE — Telephone Encounter (Signed)
For the prolia do we approve the rX they pick it up and then come in with shot for appt?

## 2022-04-06 NOTE — Telephone Encounter (Signed)
Prolia refill request. LV- 02/06/22; NV- not scheduled. Last injection: 10/31/21.

## 2022-04-07 DIAGNOSIS — N186 End stage renal disease: Secondary | ICD-10-CM | POA: Diagnosis not present

## 2022-04-07 DIAGNOSIS — Z992 Dependence on renal dialysis: Secondary | ICD-10-CM | POA: Diagnosis not present

## 2022-04-07 DIAGNOSIS — D689 Coagulation defect, unspecified: Secondary | ICD-10-CM | POA: Diagnosis not present

## 2022-04-07 DIAGNOSIS — N2581 Secondary hyperparathyroidism of renal origin: Secondary | ICD-10-CM | POA: Diagnosis not present

## 2022-04-09 DIAGNOSIS — D689 Coagulation defect, unspecified: Secondary | ICD-10-CM | POA: Diagnosis not present

## 2022-04-09 DIAGNOSIS — N186 End stage renal disease: Secondary | ICD-10-CM | POA: Diagnosis not present

## 2022-04-09 DIAGNOSIS — N2581 Secondary hyperparathyroidism of renal origin: Secondary | ICD-10-CM | POA: Diagnosis not present

## 2022-04-09 DIAGNOSIS — Z992 Dependence on renal dialysis: Secondary | ICD-10-CM | POA: Diagnosis not present

## 2022-04-10 NOTE — Telephone Encounter (Signed)
Yes

## 2022-04-11 DIAGNOSIS — N186 End stage renal disease: Secondary | ICD-10-CM | POA: Diagnosis not present

## 2022-04-11 DIAGNOSIS — D689 Coagulation defect, unspecified: Secondary | ICD-10-CM | POA: Diagnosis not present

## 2022-04-11 DIAGNOSIS — N2581 Secondary hyperparathyroidism of renal origin: Secondary | ICD-10-CM | POA: Diagnosis not present

## 2022-04-11 DIAGNOSIS — Z992 Dependence on renal dialysis: Secondary | ICD-10-CM | POA: Diagnosis not present

## 2022-04-14 DIAGNOSIS — N186 End stage renal disease: Secondary | ICD-10-CM | POA: Diagnosis not present

## 2022-04-14 DIAGNOSIS — Z992 Dependence on renal dialysis: Secondary | ICD-10-CM | POA: Diagnosis not present

## 2022-04-14 DIAGNOSIS — N2581 Secondary hyperparathyroidism of renal origin: Secondary | ICD-10-CM | POA: Diagnosis not present

## 2022-04-14 DIAGNOSIS — D689 Coagulation defect, unspecified: Secondary | ICD-10-CM | POA: Diagnosis not present

## 2022-04-16 DIAGNOSIS — N186 End stage renal disease: Secondary | ICD-10-CM | POA: Diagnosis not present

## 2022-04-16 DIAGNOSIS — N2581 Secondary hyperparathyroidism of renal origin: Secondary | ICD-10-CM | POA: Diagnosis not present

## 2022-04-16 DIAGNOSIS — Z992 Dependence on renal dialysis: Secondary | ICD-10-CM | POA: Diagnosis not present

## 2022-04-16 DIAGNOSIS — D689 Coagulation defect, unspecified: Secondary | ICD-10-CM | POA: Diagnosis not present

## 2022-04-18 DIAGNOSIS — N2581 Secondary hyperparathyroidism of renal origin: Secondary | ICD-10-CM | POA: Diagnosis not present

## 2022-04-18 DIAGNOSIS — Z992 Dependence on renal dialysis: Secondary | ICD-10-CM | POA: Diagnosis not present

## 2022-04-18 DIAGNOSIS — D689 Coagulation defect, unspecified: Secondary | ICD-10-CM | POA: Diagnosis not present

## 2022-04-18 DIAGNOSIS — N186 End stage renal disease: Secondary | ICD-10-CM | POA: Diagnosis not present

## 2022-04-21 DIAGNOSIS — N186 End stage renal disease: Secondary | ICD-10-CM | POA: Diagnosis not present

## 2022-04-21 DIAGNOSIS — N2581 Secondary hyperparathyroidism of renal origin: Secondary | ICD-10-CM | POA: Diagnosis not present

## 2022-04-21 DIAGNOSIS — Z992 Dependence on renal dialysis: Secondary | ICD-10-CM | POA: Diagnosis not present

## 2022-04-21 DIAGNOSIS — D689 Coagulation defect, unspecified: Secondary | ICD-10-CM | POA: Diagnosis not present

## 2022-04-23 DIAGNOSIS — Z992 Dependence on renal dialysis: Secondary | ICD-10-CM | POA: Diagnosis not present

## 2022-04-23 DIAGNOSIS — N186 End stage renal disease: Secondary | ICD-10-CM | POA: Diagnosis not present

## 2022-04-23 DIAGNOSIS — D689 Coagulation defect, unspecified: Secondary | ICD-10-CM | POA: Diagnosis not present

## 2022-04-23 DIAGNOSIS — N2581 Secondary hyperparathyroidism of renal origin: Secondary | ICD-10-CM | POA: Diagnosis not present

## 2022-04-24 ENCOUNTER — Encounter: Payer: Self-pay | Admitting: Family

## 2022-04-24 ENCOUNTER — Ambulatory Visit (INDEPENDENT_AMBULATORY_CARE_PROVIDER_SITE_OTHER): Payer: 59 | Admitting: Family

## 2022-04-24 ENCOUNTER — Telehealth (INDEPENDENT_AMBULATORY_CARE_PROVIDER_SITE_OTHER): Payer: Self-pay

## 2022-04-24 VITALS — BP 126/82 | HR 78 | Temp 97.8°F | Ht 66.0 in | Wt 136.0 lb

## 2022-04-24 DIAGNOSIS — M62830 Muscle spasm of back: Secondary | ICD-10-CM | POA: Insufficient documentation

## 2022-04-24 NOTE — Assessment & Plan Note (Signed)
Continue flexeril for a few more days Suggested otc lidocaine patches, tylenol prn  Warm compresses to site.  Massage as able.  Do not suspect UTI however if pt able to bring back urine (unable to void today) can return it to office and we will run ua and urine culture.

## 2022-04-24 NOTE — Telephone Encounter (Signed)
Spoke with the patient and she is scheduled with Dr. Lucky Cowboy on 04/27/22 with a 1:30 pm arrival time to the [facility]. Pre-procedure instructions were discussed and will be faxed to [facility].

## 2022-04-24 NOTE — Progress Notes (Signed)
Established Patient Office Visit  Subjective:   Patient ID: Kimberly Frost, female    DOB: 1950/11/23  Age: 72 y.o. MRN: [id number]  CC:  Chief Complaint  Patient presents with   Back Pain    HPI: Kimberly Frost is a 72 y.o. female presenting on 04/24/2022 for Back Pain   Back Pain    About six days ago started with some mid back pain, she states when she goes to move it hurts more. The kidney doctor gave her some flexeril, which provided her with some relief, she last took these Wednesday. Finds more so when she bends down to pick up something and or turn over in the bed. No blood seen in the urine.   Denies any urinary frequency and or urgency.  Finds probably less urinary output then usual. Not burning when she pees and no suprapubic tenderness.     ROS: Negative unless specifically indicated above in HPI.   Relevant past medical history reviewed and updated as indicated.   Allergies and medications reviewed and updated.   Current Outpatient Medications:    acetaminophen (TYLENOL) 500 MG tablet, Take 1,000 mg by mouth every 6 (six) hours as needed for moderate pain or headache., Disp: , Rfl:    atorvastatin (LIPITOR) 40 MG tablet, Take 1 tablet (40 mg total) by mouth daily., Disp: 90 tablet, Rfl: 3   calcitRIOL (ROCALTROL) 0.25 MCG capsule, Take 1 capsule (0.25 mcg total) by mouth every Tuesday, Thursday, and Saturday at 6 PM., Disp: 15 capsule, Rfl: 0   calcium carbonate (OS-CAL - DOSED IN MG OF ELEMENTAL CALCIUM) 1250 (500 Ca) MG tablet, Take 1 tablet (1,250 mg total) by mouth daily with breakfast., Disp: 30 tablet, Rfl: 0   cyclobenzaprine (FLEXERIL) 5 MG tablet, Take 5 mg by mouth 2 (two) times daily as needed., Disp: , Rfl:    denosumab (PROLIA) 60 MG/ML SOSY injection, INJECT 1 SYRINGE UNDER THE SKIN ONCE EVERY 6 MONTHS, Disp: 1 mL, Rfl: 0   gabapentin (NEURONTIN) 100 MG capsule, Take 100-200 mg by mouth at bedtime., Disp: , Rfl:    midodrine (PROAMATINE) 10 MG  tablet, Take 5 mg by mouth See admin instructions. Take 1 tablet (5mg ) by mouth 30 minutes before dialysis and take 1 tablet (5mg ) by mouth during dialysis if needed for BP support, Disp: , Rfl:    multivitamin (RENA-VIT) TABS tablet, Take 1 tablet by mouth daily., Disp: , Rfl:    promethazine (PHENERGAN) 25 MG tablet, Take 25 mg by mouth 2 (two) times daily as needed for nausea or vomiting., Disp: , Rfl:    sucroferric oxyhydroxide (VELPHORO) 500 MG chewable tablet, Chew 500-1,000 mg by mouth See admin instructions. Take 2 tablets (1000mg ) by mouth three times daily with meals and take 1 tablet (500mg ) by mouth with snacks, Disp: , Rfl:    VITAMIN D PO, Take by mouth., Disp: , Rfl:   No Known Allergies  Objective:   BP 126/82   Pulse 78   Temp 97.8 F (36.6 C) (Temporal)   Ht 5\' 6"  (1.676 m)   Wt 136 lb (61.7 kg)   SpO2 98%   BMI 21.95 kg/m    Physical Exam Constitutional:      General: She is not in acute distress.    Appearance: Normal appearance. She is normal weight. She is not ill-appearing, toxic-appearing or diaphoretic.  HENT:     Head: Normocephalic.  Cardiovascular:     Rate and Rhythm: Normal rate.  Pulmonary:     Effort: Pulmonary effort is normal.  Musculoskeletal:        General: Normal range of motion.     Thoracic back: Spasms and tenderness present.  Neurological:     General: No focal deficit present.     Mental Status: She is alert and oriented to person, place, and time. Mental status is at baseline.  Psychiatric:        Mood and Affect: Mood normal.        Behavior: Behavior normal.        Thought Content: Thought content normal.        Judgment: Judgment normal.     Assessment & Plan:  Muscle spasm of back Assessment & Plan: Continue flexeril for a few more days Suggested otc lidocaine patches, tylenol prn  Warm compresses to site.  Massage as able.  Do not suspect UTI however if pt able to bring back urine (unable to void today) can return it  to office and we will run ua and urine culture.       Follow up plan: No follow-ups on file.  Eugenia Pancoast, FNP

## 2022-04-25 DIAGNOSIS — Z992 Dependence on renal dialysis: Secondary | ICD-10-CM | POA: Diagnosis not present

## 2022-04-25 DIAGNOSIS — N186 End stage renal disease: Secondary | ICD-10-CM | POA: Diagnosis not present

## 2022-04-25 DIAGNOSIS — D689 Coagulation defect, unspecified: Secondary | ICD-10-CM | POA: Diagnosis not present

## 2022-04-25 DIAGNOSIS — N2581 Secondary hyperparathyroidism of renal origin: Secondary | ICD-10-CM | POA: Diagnosis not present

## 2022-04-27 ENCOUNTER — Encounter: Payer: Self-pay | Admitting: Vascular Surgery

## 2022-04-27 ENCOUNTER — Encounter
Admission: RE | Disposition: A | Discharge status: Home / Self Care | Payer: Self-pay | Source: Home / Self Care | Attending: Vascular Surgery

## 2022-04-27 ENCOUNTER — Other Ambulatory Visit: Payer: Self-pay

## 2022-04-27 ENCOUNTER — Ambulatory Visit
Admission: RE | Admit: 2022-04-27 | Discharge: 2022-04-27 | Disposition: A | Discharge status: Home / Self Care | Payer: 59 | Attending: Vascular Surgery | Admitting: Vascular Surgery

## 2022-04-27 DIAGNOSIS — I120 Hypertensive chronic kidney disease with stage 5 chronic kidney disease or end stage renal disease: Secondary | ICD-10-CM

## 2022-04-27 DIAGNOSIS — T82838A Hemorrhage of vascular prosthetic devices, implants and grafts, initial encounter: Secondary | ICD-10-CM | POA: Diagnosis not present

## 2022-04-27 DIAGNOSIS — T82856A Stenosis of peripheral vascular stent, initial encounter: Secondary | ICD-10-CM | POA: Insufficient documentation

## 2022-04-27 DIAGNOSIS — Z87891 Personal history of nicotine dependence: Secondary | ICD-10-CM | POA: Diagnosis not present

## 2022-04-27 DIAGNOSIS — Y841 Kidney dialysis as the cause of abnormal reaction of the patient, or of later complication, without mention of misadventure at the time of the procedure: Secondary | ICD-10-CM | POA: Diagnosis not present

## 2022-04-27 DIAGNOSIS — N186 End stage renal disease: Secondary | ICD-10-CM | POA: Insufficient documentation

## 2022-04-27 DIAGNOSIS — Z992 Dependence on renal dialysis: Secondary | ICD-10-CM | POA: Insufficient documentation

## 2022-04-27 DIAGNOSIS — T82858A Stenosis of vascular prosthetic devices, implants and grafts, initial encounter: Secondary | ICD-10-CM

## 2022-04-27 HISTORY — PX: A/V FISTULAGRAM: CATH118298

## 2022-04-27 LAB — POTASSIUM (ARMC VASCULAR LAB ONLY): Potassium (ARMC vascular lab): 4.3 mmol/L (ref 3.5–5.1)

## 2022-04-27 SURGERY — A/V FISTULAGRAM
Anesthesia: Moderate Sedation | Laterality: Left

## 2022-04-27 MED ORDER — CEFAZOLIN SODIUM-DEXTROSE 1-4 GM/50ML-% IV SOLN
INTRAVENOUS | Status: AC
  Administered 2022-04-27: 1 g via INTRAVENOUS
  Filled 2022-04-27: qty 50

## 2022-04-27 MED ORDER — FENTANYL CITRATE PF 50 MCG/ML IJ SOSY
PREFILLED_SYRINGE | INTRAMUSCULAR | Status: AC
  Filled 2022-04-27: qty 1

## 2022-04-27 MED ORDER — DIPHENHYDRAMINE HCL 50 MG/ML IJ SOLN: 50.0000 mg | Freq: Once | INTRAMUSCULAR | Status: DC | PRN

## 2022-04-27 MED ORDER — MIDAZOLAM HCL 2 MG/2ML IJ SOLN
INTRAMUSCULAR | Status: DC | PRN
  Administered 2022-04-27: 2 mg via INTRAVENOUS

## 2022-04-27 MED ORDER — SODIUM CHLORIDE 0.9 % IV SOLN: INTRAVENOUS | Status: DC

## 2022-04-27 MED ORDER — FENTANYL CITRATE (PF) 100 MCG/2ML IJ SOLN
INTRAMUSCULAR | Status: DC | PRN
  Administered 2022-04-27: 50 ug via INTRAVENOUS

## 2022-04-27 MED ORDER — HEPARIN SODIUM (PORCINE) 1000 UNIT/ML IJ SOLN
INTRAMUSCULAR | Status: AC
  Filled 2022-04-27: qty 10

## 2022-04-27 MED ORDER — IODIXANOL 320 MG/ML IV SOLN
INTRAVENOUS | Status: DC | PRN
  Administered 2022-04-27: 20 mL

## 2022-04-27 MED ORDER — METHYLPREDNISOLONE SODIUM SUCC 125 MG IJ SOLR: 125.0000 mg | Freq: Once | INTRAMUSCULAR | Status: DC | PRN

## 2022-04-27 MED ORDER — MIDAZOLAM HCL 2 MG/2ML IJ SOLN
INTRAMUSCULAR | Status: AC
  Filled 2022-04-27: qty 2

## 2022-04-27 MED ORDER — HYDROMORPHONE HCL 1 MG/ML IJ SOLN: 1.0000 mg | Freq: Once | INTRAMUSCULAR | Status: DC | PRN

## 2022-04-27 MED ORDER — CEFAZOLIN SODIUM-DEXTROSE 1-4 GM/50ML-% IV SOLN: 1.0000 g | INTRAVENOUS | Status: AC

## 2022-04-27 MED ORDER — MIDAZOLAM HCL 2 MG/ML PO SYRP: 8.0000 mg | ORAL_SOLUTION | Freq: Once | ORAL | Status: DC | PRN

## 2022-04-27 MED ORDER — FAMOTIDINE 20 MG PO TABS: 40.0000 mg | ORAL_TABLET | Freq: Once | ORAL | Status: DC | PRN

## 2022-04-27 MED ORDER — ONDANSETRON HCL 4 MG/2ML IJ SOLN: 4.0000 mg | Freq: Four times a day (QID) | INTRAMUSCULAR | Status: DC | PRN

## 2022-04-27 MED ORDER — HEPARIN SODIUM (PORCINE) 1000 UNIT/ML IJ SOLN
INTRAMUSCULAR | Status: DC | PRN
  Administered 2022-04-27: 3000 [IU] via INTRAVENOUS

## 2022-04-27 SURGICAL SUPPLY — 9 items
BALLN LUTONIX DCB 7X60X130 (BALLOONS) ×1
BALLOON LUTONIX DCB 7X60X130 (BALLOONS) IMPLANT
CANNULA 5F STIFF (CANNULA) IMPLANT
DRAPE BRACHIAL (DRAPES) IMPLANT
KIT ENCORE 26 ADVANTAGE (KITS) IMPLANT
PACK ANGIOGRAPHY (CUSTOM PROCEDURE TRAY) ×1 IMPLANT
SHEATH BRITE TIP 6FRX5.5 (SHEATH) IMPLANT
SUT MNCRL AB 4-0 PS2 18 (SUTURE) IMPLANT
WIRE SUPRACORE 190CM (WIRE) IMPLANT

## 2022-04-27 NOTE — Op Note (Signed)
[facility]    OPERATIVE NOTE   PROCEDURE: 1.   Left brachiocephalic arteriovenous fistula cannulation under ultrasound guidance 2.   Left arm fistulagram including central venogram 3.   Percutaneous transluminal angioplasty of the mid upper arm cephalic vein in-stent stenosis with 7 mm diameter by 6 cm length Lutonix drug-coated angioplasty balloon  PRE-OPERATIVE DIAGNOSIS: 1. ESRD 2. Poorly functional left brachiocephalic AVF  POST-OPERATIVE DIAGNOSIS: same as above   SURGEON: Leotis Pain, MD  ANESTHESIA: local with MCS  ESTIMATED BLOOD LOSS: 3 cc  FINDING(S): 60 to 70% in-stent stenosis in the mid upper arm cephalic vein portion of the left brachiocephalic AV fistula.  The perianastomotic region did not have hemodynamically significant stenosis and there is no central venous stenosis.  SPECIMEN(S):  None  CONTRAST: 20 cc  FLUORO TIME: 0.7 minutes  MODERATE CONSCIOUS SEDATION TIME: Approximately 17 minutes with 2 mg of Versed and 50 mcg of Fentanyl   INDICATIONS: Kimberly Frost is a 72 y.o. female who presents with malfunctioning left brachiocephalic arteriovenous fistula.  The patient is scheduled for left arm fistulagram.  The patient is aware the risks include but are not limited to: bleeding, infection, thrombosis of the cannulated access, and possible anaphylactic reaction to the contrast.  The patient is aware of the risks of the procedure and elects to proceed forward.  DESCRIPTION: After full informed written consent was obtained, the patient was brought back to the angiography suite and placed supine upon the angiography table.  The patient was connected to monitoring equipment. Moderate conscious sedation was administered with a face to face encounter with the patient throughout the procedure with my supervision of the RN administering medicines and monitoring the patient's vital signs and mental status throughout from the start of the procedure  until the patient was taken to the recovery room. The left arm was prepped and draped in the standard fashion for a percutaneous access intervention.  Under ultrasound guidance, the left brachiocephalic arteriovenous fistula was cannulated with a micropuncture needle under direct ultrasound guidance where it was patent and a permanent image was performed.  The microwire was advanced into the fistula and the needle was exchanged for the a microsheath.  I then upsized to a 6 Fr Sheath and imaging was performed.  Hand injections were completed to image the access including the central venous system. This demonstrated 60 to 70% in-stent stenosis in the mid upper arm cephalic vein portion of the left brachiocephalic AV fistula.  The perianastomotic region did not have hemodynamically significant stenosis and there is no central venous stenosis.  Based on the images, this patient will need intervention for this in-stent stenosis. I then gave the patient 3000 units of intravenous heparin.  I then crossed the stenosis with a Supracore wire.  Based on the imaging, a 7 mm x 6 cm  Lutonix drug coated angioplasty balloon was selected.  The balloon was centered around the in stent mid upper arm cephalic vein stenosis and inflated to 10 ATM for 1 minute(s).  On completion imaging, a 10% residual stenosis was present.     Based on the completion imaging, no further intervention is necessary.  The wire and balloon were removed from the sheath.  A 4-0 Monocryl purse-string suture was sewn around the sheath.  The sheath was removed while tying down the suture.  A sterile bandage was applied to the puncture site.  COMPLICATIONS: None  CONDITION: Stable   Leotis Pain  04/27/2022 2:49 PM  This note was created with Dragon Medical transcription system. Any errors in dictation are purely unintentional.

## 2022-04-27 NOTE — H&P (Signed)
[facility] Admission History & Physical  MRN : [id number]  Kimberly Frost is a 72 y.o. (02/15/1950) female who presents with chief complaint of No chief complaint on file. Marland Kitchen  History of Present Illness: I am asked to evaluate the patient by the dialysis center. The patient was sent here because they were unable to achieve adequate dialysis this morning. Furthermore the Center states there is very poor thrill and bruit. The patient states there there have been increasing problems with the access, such as "pulling clots" during dialysis and prolonged bleeding after decannulation. The patient estimates these problems have been going on for several weeks. The patient is unaware of any other change.   Patient denies pain or tenderness overlying the access.  There is no pain with dialysis.  The patient denies hand pain or finger pain consistent with steal syndrome.    There have not been any recent past interventions or declots of this access, but she has had several in years past.  The patient is not chronically hypotensive on dialysis.  Current Facility-Administered Medications  Medication Dose Route Frequency Provider Last Rate Last Admin   0.9 %  sodium chloride infusion   Intravenous Continuous Kris Hartmann, NP       ceFAZolin (ANCEF) 1-4 GM/50ML-% IVPB            ceFAZolin (ANCEF) IVPB 1 g/50 mL premix  1 g Intravenous 30 min Pre-Op Kris Hartmann, NP       diphenhydrAMINE (BENADRYL) injection 50 mg  50 mg Intravenous Once PRN Kris Hartmann, NP       famotidine (PEPCID) tablet 40 mg  40 mg Oral Once PRN Kris Hartmann, NP       HYDROmorphone (DILAUDID) injection 1 mg  1 mg Intravenous Once PRN Kris Hartmann, NP       methylPREDNISolone sodium succinate (SOLU-MEDROL) 125 mg/2 mL injection 125 mg  125 mg Intravenous Once PRN Kris Hartmann, NP       midazolam (VERSED) 2 MG/ML syrup 8 mg  8 mg Oral Once PRN Kris Hartmann, NP       ondansetron [facility])  injection 4 mg  4 mg Intravenous Q6H PRN Kris Hartmann, NP        Past Medical History:  Diagnosis Date   Chronic kidney disease    Edema    RLE   Hepatitis    Hep C   History of blood transfusion    History of radiation therapy    right lung 02/24/2021-03/10/2021  Dr Gery Pray   Hypertension    Lung cancer [facility])    Wears dentures     Past Surgical History:  Procedure Laterality Date   A/V FISTULAGRAM Left 04/11/2019   Procedure: A/V FISTULAGRAM;  Surgeon: Katha Cabal, MD;  Location: [place] [facility];  Service: Cardiovascular;  Laterality: Left;   A/V FISTULAGRAM Left 05/01/2019   Procedure: A/V FISTULAGRAM;  Surgeon: Algernon Huxley, MD;  Location: [place] [facility];  Service: Cardiovascular;  Laterality: Left;   A/V FISTULAGRAM Left 01/15/2020   Procedure: A/V FISTULAGRAM;  Surgeon: Algernon Huxley, MD;  Location: [place] [facility];  Service: Cardiovascular;  Laterality: Left;   A/V FISTULAGRAM Left 04/22/2020   Procedure: A/V FISTULAGRAM;  Surgeon: Algernon Huxley, MD;  Location: [place] [facility];  Service: Cardiovascular;  Laterality: Left;   A/V FISTULAGRAM Left 06/25/2021   Procedure: A/V Fistulagram;  Surgeon: Hortencia Pilar  G, MD;  Location: [place] [facility];  Service: Cardiovascular;  Laterality: Left;   AV FISTULA PLACEMENT Left 06/16/2018   Procedure: Left Arm ARTERIOVENOUS (AV) FISTULA CREATION;  Surgeon: Marty Heck, MD;  Location: [facility];  Service: Vascular;  Laterality: Left;   MULTIPLE TOOTH EXTRACTIONS     MYOMECTOMY      Social History   Tobacco Use   Smoking status: Former    Types: Cigarettes    Quit date: 1990    Years since quitting: 34.2   Smokeless tobacco: Never  Vaping Use   Vaping Use: Never used  Substance Use Topics   Alcohol use: Not Currently    Alcohol/week: 6.0 standard drinks of alcohol    Types: 6 Cans of beer per week    Comment: quit 3-4 years ago   Drug use: Not Currently    Types: Marijuana    Comment:  Years ago     Family History  Problem Relation Age of Onset   Diabetes Mother    Hypertension Mother    Breast cancer Mother 54   Bone cancer Father    Stroke Brother    Diabetes Brother    Drug abuse Brother    Breast cancer Maternal Aunt 66   Breast cancer Maternal Aunt 70   Kidney cancer Maternal Grandmother     No family history of bleeding or clotting disorders, autoimmune disease or porphyria  No Known Allergies   REVIEW OF SYSTEMS (Negative unless checked)  Constitutional: [] Weight loss  [] Fever  [] Chills Cardiac: [] Chest pain   [] Chest pressure   [] Palpitations   [] Shortness of breath when laying flat   [] Shortness of breath at rest   [x] Shortness of breath with exertion. Vascular:  [] Pain in legs with walking   [] Pain in legs at rest   [] Pain in legs when laying flat   [] Claudication   [] Pain in feet when walking  [] Pain in feet at rest  [] Pain in feet when laying flat   [] History of DVT   [] Phlebitis   [x] Swelling in legs   [] Varicose veins   [] Non-healing ulcers Pulmonary:   [] Uses home oxygen   [] Productive cough   [] Hemoptysis   [] Wheeze  [] COPD   [] Asthma Neurologic:  [] Dizziness  [] Blackouts   [] Seizures   [] History of stroke   [] History of TIA  [] Aphasia   [] Temporary blindness   [] Dysphagia   [] Weakness or numbness in arms   [] Weakness or numbness in legs Musculoskeletal:  [x] Arthritis   [] Joint swelling   [x] Joint pain   [] Low back pain Hematologic:  [] Easy bruising  [] Easy bleeding   [] Hypercoagulable state   [x] Anemic  [] Hepatitis Gastrointestinal:  [] Blood in stool   [] Vomiting blood  [] Gastroesophageal reflux/heartburn   [] Difficulty swallowing. Genitourinary:  [x] Chronic kidney disease   [] Difficult urination  [] Frequent urination  [] Burning with urination   [] Blood in urine Skin:  [] Rashes   [] Ulcers   [] Wounds Psychological:  [] History of anxiety   []  History of major depression.  Physical Examination  There were no vitals filed for this visit. There is  no height or weight on file to calculate BMI. Gen: WD/WN, NAD Head: [place]/AT, No temporalis wasting.  Ear/Nose/Throat: Hearing grossly intact, nares w/o erythema or drainage, oropharynx w/o Erythema/Exudate,  Eyes: Conjunctiva clear, sclera non-icteric Neck: Trachea midline.  No JVD.  Pulmonary:  Good air movement, respirations not labored, no use of accessory muscles.  Cardiac: RRR, normal S1, S2. Vascular: pulsatile access left arm Vessel Right Left  Radial Palpable Palpable   Musculoskeletal: M/S 5/5 throughout.  Extremities without ischemic changes.  No deformity or atrophy.  Neurologic: Sensation grossly intact in extremities.  Symmetrical.  Speech is fluent. Motor exam as listed above. Psychiatric: Judgment intact, Mood & affect appropriate for pt's clinical situation. Dermatologic: No rashes or ulcers noted.  No cellulitis or open wounds.   CBC Lab Results  Component Value Date   WBC 6.7 11/09/2021   HGB 11.4 (L) 11/09/2021   HCT 34.3 (L) 11/09/2021   MCV 84.9 11/09/2021   PLT 258 11/09/2021    BMET    Component Value Date/Time   NA 136 11/10/2021 0454   NA 138 06/23/2018 0000   K 4.5 11/10/2021 0454   K 4.9 06/23/2018 0000   CL 94 (L) 11/10/2021 0454   CO2 26 11/10/2021 0454   GLUCOSE 105 (H) 11/10/2021 0454   BUN 50 (H) 11/10/2021 0454   BUN 61 06/23/2018 0000   CREATININE 9.24 (H) 11/10/2021 0454   CREATININE 7.13 06/23/2018 0000   CALCIUM 6.6 (L) 11/10/2021 0454   CALCIUM 8.8 06/23/2018 0000   GFRNONAA 4 (L) 11/10/2021 0454   GFRAA 6 06/23/2018 0000   CrCl cannot be calculated (Patient's most recent lab result is older than the maximum 21 days allowed.).  COAG No results found for: "INR", "PROTIME"  Radiology CT CHEST WO CONTRAST  Result Date: 04/03/2022 CLINICAL DATA:  Non-small cell lung cancer, assess treatment response. * Tracking Code: BO * EXAM: CT CHEST WITHOUT CONTRAST TECHNIQUE: Multidetector CT imaging of the chest was performed following the  standard protocol without IV contrast. RADIATION DOSE REDUCTION: This exam was performed according to the departmental dose-optimization program which includes automated exposure control, adjustment of the mA and/or kV according to patient size and/or use of iterative reconstruction technique. COMPARISON:  Abdominopelvic CT 02/06/2022. Chest CT 07/09/2021 and PET-CT 07/21/2021. FINDINGS: Cardiovascular: Again demonstrated is extensive atherosclerosis of the aorta, great vessels and coronary arteries. There is stable borderline dilatation of the ascending aorta to 4.0 cm. No acute vascular findings identified on noncontrast imaging. There are probable calcifications of the aortic valve. The heart size is normal. There is no pericardial effusion. Mediastinum/Nodes: There are no enlarged mediastinal, hilar or axillary lymph nodes. The thyroid gland, trachea and esophagus demonstrate no significant findings. Lungs/Pleura: No pleural effusion or pneumothorax. Progressive treatment changes in the right upper lobe with associated volume loss. The dominant hypermetabolic lesion previously demonstrated peripherally has decreased in size, now measuring 1.4 x 1.1 cm on image 30/7 (previously 2.6 x 2.4 cm). The more central right suprahilar nodule is now partly obscured by treatment changes, although also appears smaller, measuring approximately 1.4 cm maximally on image 37/7 (previously 2.0 cm). Unchanged 5 mm right lower lobe nodule on image 107/7. No new or enlarging nodules are identified. Mild centrilobular and paraseptal emphysema. Upper abdomen: The visualized upper abdomen appears stable, without significant findings. Musculoskeletal/Chest wall: There is no chest wall mass or suspicious osseous finding. IMPRESSION: 1. Progressive treatment changes in the right upper lobe with decreased size of the dominant hypermetabolic lesion previously demonstrated peripherally. The more central right suprahilar nodule has also  decreased in size. 2. No new or enlarging nodules. No evidence of progressive metastatic disease. 3.  Aortic Atherosclerosis (ICD10-I70.0). Electronically Signed   By: Richardean Sale M.D.   On: 04/03/2022 09:39    Assessment/Plan 1.  Complication dialysis device:  Patient's dialysis access is malfunctioning. The patient will undergo angiography and correction of any problems  using interventional techniques with the hope of restoring function to the access.  The risks and benefits were described to the patient.  All questions were answered.  The patient agrees to proceed with angiography and intervention. Potassium will be drawn to ensure that it is an appropriate level prior to performing intervention. 2.  End-stage renal disease requiring hemodialysis:  Patient will continue dialysis therapy without further interruption if a successful intervention is not achieved then a tunneled catheter will be placed. Dialysis has already been arranged. 3.  Hypertension:  Patient will continue medical management; nephrology is following no changes in oral medications.     Leotis Pain, MD  04/27/2022 1:33 PM

## 2022-04-28 DIAGNOSIS — Z992 Dependence on renal dialysis: Secondary | ICD-10-CM | POA: Diagnosis not present

## 2022-04-28 DIAGNOSIS — D689 Coagulation defect, unspecified: Secondary | ICD-10-CM | POA: Diagnosis not present

## 2022-04-28 DIAGNOSIS — N2581 Secondary hyperparathyroidism of renal origin: Secondary | ICD-10-CM | POA: Diagnosis not present

## 2022-04-28 DIAGNOSIS — N186 End stage renal disease: Secondary | ICD-10-CM | POA: Diagnosis not present

## 2022-04-30 DIAGNOSIS — N2581 Secondary hyperparathyroidism of renal origin: Secondary | ICD-10-CM | POA: Diagnosis not present

## 2022-04-30 DIAGNOSIS — N186 End stage renal disease: Secondary | ICD-10-CM | POA: Diagnosis not present

## 2022-04-30 DIAGNOSIS — Z992 Dependence on renal dialysis: Secondary | ICD-10-CM | POA: Diagnosis not present

## 2022-04-30 DIAGNOSIS — D689 Coagulation defect, unspecified: Secondary | ICD-10-CM | POA: Diagnosis not present

## 2022-05-02 DIAGNOSIS — N2581 Secondary hyperparathyroidism of renal origin: Secondary | ICD-10-CM | POA: Diagnosis not present

## 2022-05-02 DIAGNOSIS — Z992 Dependence on renal dialysis: Secondary | ICD-10-CM | POA: Diagnosis not present

## 2022-05-02 DIAGNOSIS — N186 End stage renal disease: Secondary | ICD-10-CM | POA: Diagnosis not present

## 2022-05-02 DIAGNOSIS — D689 Coagulation defect, unspecified: Secondary | ICD-10-CM | POA: Diagnosis not present

## 2022-05-03 DIAGNOSIS — I129 Hypertensive chronic kidney disease with stage 1 through stage 4 chronic kidney disease, or unspecified chronic kidney disease: Secondary | ICD-10-CM | POA: Diagnosis not present

## 2022-05-03 DIAGNOSIS — N186 End stage renal disease: Secondary | ICD-10-CM | POA: Diagnosis not present

## 2022-05-03 DIAGNOSIS — Z992 Dependence on renal dialysis: Secondary | ICD-10-CM | POA: Diagnosis not present

## 2022-05-05 DIAGNOSIS — D631 Anemia in chronic kidney disease: Secondary | ICD-10-CM | POA: Diagnosis not present

## 2022-05-05 DIAGNOSIS — D508 Other iron deficiency anemias: Secondary | ICD-10-CM | POA: Diagnosis not present

## 2022-05-05 DIAGNOSIS — D689 Coagulation defect, unspecified: Secondary | ICD-10-CM | POA: Diagnosis not present

## 2022-05-05 DIAGNOSIS — N186 End stage renal disease: Secondary | ICD-10-CM | POA: Diagnosis not present

## 2022-05-05 DIAGNOSIS — N2581 Secondary hyperparathyroidism of renal origin: Secondary | ICD-10-CM | POA: Diagnosis not present

## 2022-05-05 DIAGNOSIS — Z992 Dependence on renal dialysis: Secondary | ICD-10-CM | POA: Diagnosis not present

## 2022-05-07 DIAGNOSIS — Z992 Dependence on renal dialysis: Secondary | ICD-10-CM | POA: Diagnosis not present

## 2022-05-07 DIAGNOSIS — N186 End stage renal disease: Secondary | ICD-10-CM | POA: Diagnosis not present

## 2022-05-07 DIAGNOSIS — D689 Coagulation defect, unspecified: Secondary | ICD-10-CM | POA: Diagnosis not present

## 2022-05-07 DIAGNOSIS — N2581 Secondary hyperparathyroidism of renal origin: Secondary | ICD-10-CM | POA: Diagnosis not present

## 2022-05-07 DIAGNOSIS — D631 Anemia in chronic kidney disease: Secondary | ICD-10-CM | POA: Diagnosis not present

## 2022-05-07 DIAGNOSIS — D508 Other iron deficiency anemias: Secondary | ICD-10-CM | POA: Diagnosis not present

## 2022-05-09 DIAGNOSIS — D631 Anemia in chronic kidney disease: Secondary | ICD-10-CM | POA: Diagnosis not present

## 2022-05-09 DIAGNOSIS — N2581 Secondary hyperparathyroidism of renal origin: Secondary | ICD-10-CM | POA: Diagnosis not present

## 2022-05-09 DIAGNOSIS — D508 Other iron deficiency anemias: Secondary | ICD-10-CM | POA: Diagnosis not present

## 2022-05-09 DIAGNOSIS — D689 Coagulation defect, unspecified: Secondary | ICD-10-CM | POA: Diagnosis not present

## 2022-05-09 DIAGNOSIS — Z992 Dependence on renal dialysis: Secondary | ICD-10-CM | POA: Diagnosis not present

## 2022-05-09 DIAGNOSIS — N186 End stage renal disease: Secondary | ICD-10-CM | POA: Diagnosis not present

## 2022-05-12 DIAGNOSIS — N2581 Secondary hyperparathyroidism of renal origin: Secondary | ICD-10-CM | POA: Diagnosis not present

## 2022-05-12 DIAGNOSIS — D689 Coagulation defect, unspecified: Secondary | ICD-10-CM | POA: Diagnosis not present

## 2022-05-12 DIAGNOSIS — N186 End stage renal disease: Secondary | ICD-10-CM | POA: Diagnosis not present

## 2022-05-12 DIAGNOSIS — D508 Other iron deficiency anemias: Secondary | ICD-10-CM | POA: Diagnosis not present

## 2022-05-12 DIAGNOSIS — Z992 Dependence on renal dialysis: Secondary | ICD-10-CM | POA: Diagnosis not present

## 2022-05-12 DIAGNOSIS — D631 Anemia in chronic kidney disease: Secondary | ICD-10-CM | POA: Diagnosis not present

## 2022-05-14 DIAGNOSIS — N2581 Secondary hyperparathyroidism of renal origin: Secondary | ICD-10-CM | POA: Diagnosis not present

## 2022-05-14 DIAGNOSIS — D689 Coagulation defect, unspecified: Secondary | ICD-10-CM | POA: Diagnosis not present

## 2022-05-14 DIAGNOSIS — D508 Other iron deficiency anemias: Secondary | ICD-10-CM | POA: Diagnosis not present

## 2022-05-14 DIAGNOSIS — N186 End stage renal disease: Secondary | ICD-10-CM | POA: Diagnosis not present

## 2022-05-14 DIAGNOSIS — Z992 Dependence on renal dialysis: Secondary | ICD-10-CM | POA: Diagnosis not present

## 2022-05-14 DIAGNOSIS — D631 Anemia in chronic kidney disease: Secondary | ICD-10-CM | POA: Diagnosis not present

## 2022-05-16 DIAGNOSIS — Z992 Dependence on renal dialysis: Secondary | ICD-10-CM | POA: Diagnosis not present

## 2022-05-16 DIAGNOSIS — D689 Coagulation defect, unspecified: Secondary | ICD-10-CM | POA: Diagnosis not present

## 2022-05-16 DIAGNOSIS — D631 Anemia in chronic kidney disease: Secondary | ICD-10-CM | POA: Diagnosis not present

## 2022-05-16 DIAGNOSIS — N2581 Secondary hyperparathyroidism of renal origin: Secondary | ICD-10-CM | POA: Diagnosis not present

## 2022-05-16 DIAGNOSIS — D508 Other iron deficiency anemias: Secondary | ICD-10-CM | POA: Diagnosis not present

## 2022-05-16 DIAGNOSIS — N186 End stage renal disease: Secondary | ICD-10-CM | POA: Diagnosis not present

## 2022-05-19 DIAGNOSIS — N186 End stage renal disease: Secondary | ICD-10-CM | POA: Diagnosis not present

## 2022-05-19 DIAGNOSIS — D631 Anemia in chronic kidney disease: Secondary | ICD-10-CM | POA: Diagnosis not present

## 2022-05-19 DIAGNOSIS — D508 Other iron deficiency anemias: Secondary | ICD-10-CM | POA: Diagnosis not present

## 2022-05-19 DIAGNOSIS — N2581 Secondary hyperparathyroidism of renal origin: Secondary | ICD-10-CM | POA: Diagnosis not present

## 2022-05-19 DIAGNOSIS — Z992 Dependence on renal dialysis: Secondary | ICD-10-CM | POA: Diagnosis not present

## 2022-05-19 DIAGNOSIS — D689 Coagulation defect, unspecified: Secondary | ICD-10-CM | POA: Diagnosis not present

## 2022-05-21 DIAGNOSIS — D631 Anemia in chronic kidney disease: Secondary | ICD-10-CM | POA: Diagnosis not present

## 2022-05-21 DIAGNOSIS — D689 Coagulation defect, unspecified: Secondary | ICD-10-CM | POA: Diagnosis not present

## 2022-05-21 DIAGNOSIS — Z992 Dependence on renal dialysis: Secondary | ICD-10-CM | POA: Diagnosis not present

## 2022-05-21 DIAGNOSIS — N2581 Secondary hyperparathyroidism of renal origin: Secondary | ICD-10-CM | POA: Diagnosis not present

## 2022-05-21 DIAGNOSIS — N186 End stage renal disease: Secondary | ICD-10-CM | POA: Diagnosis not present

## 2022-05-21 DIAGNOSIS — D508 Other iron deficiency anemias: Secondary | ICD-10-CM | POA: Diagnosis not present

## 2022-05-23 DIAGNOSIS — D508 Other iron deficiency anemias: Secondary | ICD-10-CM | POA: Diagnosis not present

## 2022-05-23 DIAGNOSIS — Z992 Dependence on renal dialysis: Secondary | ICD-10-CM | POA: Diagnosis not present

## 2022-05-23 DIAGNOSIS — D689 Coagulation defect, unspecified: Secondary | ICD-10-CM | POA: Diagnosis not present

## 2022-05-23 DIAGNOSIS — D631 Anemia in chronic kidney disease: Secondary | ICD-10-CM | POA: Diagnosis not present

## 2022-05-23 DIAGNOSIS — N2581 Secondary hyperparathyroidism of renal origin: Secondary | ICD-10-CM | POA: Diagnosis not present

## 2022-05-23 DIAGNOSIS — N186 End stage renal disease: Secondary | ICD-10-CM | POA: Diagnosis not present

## 2022-05-25 ENCOUNTER — Telehealth: Payer: Self-pay

## 2022-05-25 ENCOUNTER — Other Ambulatory Visit (HOSPITAL_COMMUNITY): Payer: Self-pay

## 2022-05-25 ENCOUNTER — Encounter (HOSPITAL_COMMUNITY): Payer: Self-pay

## 2022-05-25 NOTE — Telephone Encounter (Signed)
Error

## 2022-05-25 NOTE — Telephone Encounter (Signed)
Authorization Number: [id number]

## 2022-05-25 NOTE — Telephone Encounter (Signed)
Can we please get a PA for this patient for Prolia?  Last Prolia inj was 10/31/2021.  Please respond to Barnie Mort at [facility].  I am filling in this week. Thank you!

## 2022-05-25 NOTE — Telephone Encounter (Signed)
Prolia BIV routed to Joellen Thompson 

## 2022-05-25 NOTE — Telephone Encounter (Signed)
Pt ready for scheduling for Prolia on or after : 05/25/22  Out-of-pocket cost due at time of visit: $327  Primary: [employer] Prolia co-insurance: 20% Admin fee co-insurance: 20%  Secondary: --- Prolia co-insurance:  Admin fee co-insurance:   Medical Benefit Details: Date Benefits were checked: 04/27/22 Deductible: $240 met of $240 required/ Coinsurance: 20%/ Admin Fee: 20%  Prior Auth: Approved PA# [id number]  Expiration Date: 05/25/23   Pharmacy benefit: Copay $0 If patient wants fill through the pharmacy benefit please send prescription to: [facility], and include estimated need by date in rx notes. Pharmacy will ship medication directly to the office.  Patient NOT eligible for Prolia Copay Card. Copay Card can make patient's cost as little as $25. Link to apply: [email or web address]  ** This summary of benefits is an estimation of the patient's out-of-pocket cost. Exact cost may very based on individual plan coverage.

## 2022-05-26 DIAGNOSIS — Z992 Dependence on renal dialysis: Secondary | ICD-10-CM | POA: Diagnosis not present

## 2022-05-26 DIAGNOSIS — D631 Anemia in chronic kidney disease: Secondary | ICD-10-CM | POA: Diagnosis not present

## 2022-05-26 DIAGNOSIS — N186 End stage renal disease: Secondary | ICD-10-CM | POA: Diagnosis not present

## 2022-05-26 DIAGNOSIS — D508 Other iron deficiency anemias: Secondary | ICD-10-CM | POA: Diagnosis not present

## 2022-05-26 DIAGNOSIS — D689 Coagulation defect, unspecified: Secondary | ICD-10-CM | POA: Diagnosis not present

## 2022-05-26 DIAGNOSIS — N2581 Secondary hyperparathyroidism of renal origin: Secondary | ICD-10-CM | POA: Diagnosis not present

## 2022-05-28 DIAGNOSIS — N2581 Secondary hyperparathyroidism of renal origin: Secondary | ICD-10-CM | POA: Diagnosis not present

## 2022-05-28 DIAGNOSIS — D508 Other iron deficiency anemias: Secondary | ICD-10-CM | POA: Diagnosis not present

## 2022-05-28 DIAGNOSIS — Z992 Dependence on renal dialysis: Secondary | ICD-10-CM | POA: Diagnosis not present

## 2022-05-28 DIAGNOSIS — D689 Coagulation defect, unspecified: Secondary | ICD-10-CM | POA: Diagnosis not present

## 2022-05-28 DIAGNOSIS — N186 End stage renal disease: Secondary | ICD-10-CM | POA: Diagnosis not present

## 2022-05-28 DIAGNOSIS — D631 Anemia in chronic kidney disease: Secondary | ICD-10-CM | POA: Diagnosis not present

## 2022-05-30 DIAGNOSIS — N2581 Secondary hyperparathyroidism of renal origin: Secondary | ICD-10-CM | POA: Diagnosis not present

## 2022-05-30 DIAGNOSIS — Z992 Dependence on renal dialysis: Secondary | ICD-10-CM | POA: Diagnosis not present

## 2022-05-30 DIAGNOSIS — D508 Other iron deficiency anemias: Secondary | ICD-10-CM | POA: Diagnosis not present

## 2022-05-30 DIAGNOSIS — D631 Anemia in chronic kidney disease: Secondary | ICD-10-CM | POA: Diagnosis not present

## 2022-05-30 DIAGNOSIS — N186 End stage renal disease: Secondary | ICD-10-CM | POA: Diagnosis not present

## 2022-05-30 DIAGNOSIS — D689 Coagulation defect, unspecified: Secondary | ICD-10-CM | POA: Diagnosis not present

## 2022-06-02 DIAGNOSIS — D689 Coagulation defect, unspecified: Secondary | ICD-10-CM | POA: Diagnosis not present

## 2022-06-02 DIAGNOSIS — D631 Anemia in chronic kidney disease: Secondary | ICD-10-CM | POA: Diagnosis not present

## 2022-06-02 DIAGNOSIS — Z992 Dependence on renal dialysis: Secondary | ICD-10-CM | POA: Diagnosis not present

## 2022-06-02 DIAGNOSIS — N186 End stage renal disease: Secondary | ICD-10-CM | POA: Diagnosis not present

## 2022-06-02 DIAGNOSIS — D508 Other iron deficiency anemias: Secondary | ICD-10-CM | POA: Diagnosis not present

## 2022-06-02 DIAGNOSIS — N2581 Secondary hyperparathyroidism of renal origin: Secondary | ICD-10-CM | POA: Diagnosis not present

## 2022-06-02 DIAGNOSIS — I129 Hypertensive chronic kidney disease with stage 1 through stage 4 chronic kidney disease, or unspecified chronic kidney disease: Secondary | ICD-10-CM | POA: Diagnosis not present

## 2022-06-02 NOTE — Telephone Encounter (Signed)
Called patient she states she is now on dialysis and was told not to take injection any more. I have removed her from out list

## 2022-06-03 NOTE — Telephone Encounter (Signed)
Noted  

## 2022-06-04 DIAGNOSIS — D509 Iron deficiency anemia, unspecified: Secondary | ICD-10-CM | POA: Diagnosis not present

## 2022-06-04 DIAGNOSIS — D631 Anemia in chronic kidney disease: Secondary | ICD-10-CM | POA: Diagnosis not present

## 2022-06-04 DIAGNOSIS — N2581 Secondary hyperparathyroidism of renal origin: Secondary | ICD-10-CM | POA: Diagnosis not present

## 2022-06-04 DIAGNOSIS — Z23 Encounter for immunization: Secondary | ICD-10-CM | POA: Diagnosis not present

## 2022-06-04 DIAGNOSIS — D508 Other iron deficiency anemias: Secondary | ICD-10-CM | POA: Diagnosis not present

## 2022-06-04 DIAGNOSIS — N186 End stage renal disease: Secondary | ICD-10-CM | POA: Diagnosis not present

## 2022-06-04 DIAGNOSIS — Z992 Dependence on renal dialysis: Secondary | ICD-10-CM | POA: Diagnosis not present

## 2022-06-04 DIAGNOSIS — D689 Coagulation defect, unspecified: Secondary | ICD-10-CM | POA: Diagnosis not present

## 2022-06-06 DIAGNOSIS — Z992 Dependence on renal dialysis: Secondary | ICD-10-CM | POA: Diagnosis not present

## 2022-06-06 DIAGNOSIS — Z23 Encounter for immunization: Secondary | ICD-10-CM | POA: Diagnosis not present

## 2022-06-06 DIAGNOSIS — D508 Other iron deficiency anemias: Secondary | ICD-10-CM | POA: Diagnosis not present

## 2022-06-06 DIAGNOSIS — N186 End stage renal disease: Secondary | ICD-10-CM | POA: Diagnosis not present

## 2022-06-06 DIAGNOSIS — D509 Iron deficiency anemia, unspecified: Secondary | ICD-10-CM | POA: Diagnosis not present

## 2022-06-06 DIAGNOSIS — N2581 Secondary hyperparathyroidism of renal origin: Secondary | ICD-10-CM | POA: Diagnosis not present

## 2022-06-06 DIAGNOSIS — D631 Anemia in chronic kidney disease: Secondary | ICD-10-CM | POA: Diagnosis not present

## 2022-06-06 DIAGNOSIS — D689 Coagulation defect, unspecified: Secondary | ICD-10-CM | POA: Diagnosis not present

## 2022-06-08 ENCOUNTER — Encounter (INDEPENDENT_AMBULATORY_CARE_PROVIDER_SITE_OTHER): Payer: 59

## 2022-06-08 ENCOUNTER — Ambulatory Visit (INDEPENDENT_AMBULATORY_CARE_PROVIDER_SITE_OTHER): Payer: 59 | Admitting: Nurse Practitioner

## 2022-06-09 DIAGNOSIS — D509 Iron deficiency anemia, unspecified: Secondary | ICD-10-CM | POA: Diagnosis not present

## 2022-06-09 DIAGNOSIS — D689 Coagulation defect, unspecified: Secondary | ICD-10-CM | POA: Diagnosis not present

## 2022-06-09 DIAGNOSIS — N2581 Secondary hyperparathyroidism of renal origin: Secondary | ICD-10-CM | POA: Diagnosis not present

## 2022-06-09 DIAGNOSIS — N186 End stage renal disease: Secondary | ICD-10-CM | POA: Diagnosis not present

## 2022-06-09 DIAGNOSIS — D508 Other iron deficiency anemias: Secondary | ICD-10-CM | POA: Diagnosis not present

## 2022-06-09 DIAGNOSIS — Z992 Dependence on renal dialysis: Secondary | ICD-10-CM | POA: Diagnosis not present

## 2022-06-09 DIAGNOSIS — Z23 Encounter for immunization: Secondary | ICD-10-CM | POA: Diagnosis not present

## 2022-06-09 DIAGNOSIS — D631 Anemia in chronic kidney disease: Secondary | ICD-10-CM | POA: Diagnosis not present

## 2022-06-11 DIAGNOSIS — D689 Coagulation defect, unspecified: Secondary | ICD-10-CM | POA: Diagnosis not present

## 2022-06-11 DIAGNOSIS — D508 Other iron deficiency anemias: Secondary | ICD-10-CM | POA: Diagnosis not present

## 2022-06-11 DIAGNOSIS — N2581 Secondary hyperparathyroidism of renal origin: Secondary | ICD-10-CM | POA: Diagnosis not present

## 2022-06-11 DIAGNOSIS — N186 End stage renal disease: Secondary | ICD-10-CM | POA: Diagnosis not present

## 2022-06-11 DIAGNOSIS — Z992 Dependence on renal dialysis: Secondary | ICD-10-CM | POA: Diagnosis not present

## 2022-06-11 DIAGNOSIS — D509 Iron deficiency anemia, unspecified: Secondary | ICD-10-CM | POA: Diagnosis not present

## 2022-06-11 DIAGNOSIS — D631 Anemia in chronic kidney disease: Secondary | ICD-10-CM | POA: Diagnosis not present

## 2022-06-11 DIAGNOSIS — Z23 Encounter for immunization: Secondary | ICD-10-CM | POA: Diagnosis not present

## 2022-06-13 DIAGNOSIS — Z23 Encounter for immunization: Secondary | ICD-10-CM | POA: Diagnosis not present

## 2022-06-13 DIAGNOSIS — D508 Other iron deficiency anemias: Secondary | ICD-10-CM | POA: Diagnosis not present

## 2022-06-13 DIAGNOSIS — D631 Anemia in chronic kidney disease: Secondary | ICD-10-CM | POA: Diagnosis not present

## 2022-06-13 DIAGNOSIS — N2581 Secondary hyperparathyroidism of renal origin: Secondary | ICD-10-CM | POA: Diagnosis not present

## 2022-06-13 DIAGNOSIS — D509 Iron deficiency anemia, unspecified: Secondary | ICD-10-CM | POA: Diagnosis not present

## 2022-06-13 DIAGNOSIS — Z992 Dependence on renal dialysis: Secondary | ICD-10-CM | POA: Diagnosis not present

## 2022-06-13 DIAGNOSIS — D689 Coagulation defect, unspecified: Secondary | ICD-10-CM | POA: Diagnosis not present

## 2022-06-13 DIAGNOSIS — N186 End stage renal disease: Secondary | ICD-10-CM | POA: Diagnosis not present

## 2022-06-16 DIAGNOSIS — D689 Coagulation defect, unspecified: Secondary | ICD-10-CM | POA: Diagnosis not present

## 2022-06-16 DIAGNOSIS — D631 Anemia in chronic kidney disease: Secondary | ICD-10-CM | POA: Diagnosis not present

## 2022-06-16 DIAGNOSIS — N186 End stage renal disease: Secondary | ICD-10-CM | POA: Diagnosis not present

## 2022-06-16 DIAGNOSIS — Z23 Encounter for immunization: Secondary | ICD-10-CM | POA: Diagnosis not present

## 2022-06-16 DIAGNOSIS — N2581 Secondary hyperparathyroidism of renal origin: Secondary | ICD-10-CM | POA: Diagnosis not present

## 2022-06-16 DIAGNOSIS — D508 Other iron deficiency anemias: Secondary | ICD-10-CM | POA: Diagnosis not present

## 2022-06-16 DIAGNOSIS — D509 Iron deficiency anemia, unspecified: Secondary | ICD-10-CM | POA: Diagnosis not present

## 2022-06-16 DIAGNOSIS — Z992 Dependence on renal dialysis: Secondary | ICD-10-CM | POA: Diagnosis not present

## 2022-06-18 DIAGNOSIS — Z992 Dependence on renal dialysis: Secondary | ICD-10-CM | POA: Diagnosis not present

## 2022-06-18 DIAGNOSIS — D631 Anemia in chronic kidney disease: Secondary | ICD-10-CM | POA: Diagnosis not present

## 2022-06-18 DIAGNOSIS — Z23 Encounter for immunization: Secondary | ICD-10-CM | POA: Diagnosis not present

## 2022-06-18 DIAGNOSIS — D689 Coagulation defect, unspecified: Secondary | ICD-10-CM | POA: Diagnosis not present

## 2022-06-18 DIAGNOSIS — N186 End stage renal disease: Secondary | ICD-10-CM | POA: Diagnosis not present

## 2022-06-18 DIAGNOSIS — N2581 Secondary hyperparathyroidism of renal origin: Secondary | ICD-10-CM | POA: Diagnosis not present

## 2022-06-18 DIAGNOSIS — D509 Iron deficiency anemia, unspecified: Secondary | ICD-10-CM | POA: Diagnosis not present

## 2022-06-18 DIAGNOSIS — D508 Other iron deficiency anemias: Secondary | ICD-10-CM | POA: Diagnosis not present

## 2022-06-20 DIAGNOSIS — D508 Other iron deficiency anemias: Secondary | ICD-10-CM | POA: Diagnosis not present

## 2022-06-20 DIAGNOSIS — N186 End stage renal disease: Secondary | ICD-10-CM | POA: Diagnosis not present

## 2022-06-20 DIAGNOSIS — N2581 Secondary hyperparathyroidism of renal origin: Secondary | ICD-10-CM | POA: Diagnosis not present

## 2022-06-20 DIAGNOSIS — D689 Coagulation defect, unspecified: Secondary | ICD-10-CM | POA: Diagnosis not present

## 2022-06-20 DIAGNOSIS — Z992 Dependence on renal dialysis: Secondary | ICD-10-CM | POA: Diagnosis not present

## 2022-06-20 DIAGNOSIS — D631 Anemia in chronic kidney disease: Secondary | ICD-10-CM | POA: Diagnosis not present

## 2022-06-20 DIAGNOSIS — Z23 Encounter for immunization: Secondary | ICD-10-CM | POA: Diagnosis not present

## 2022-06-20 DIAGNOSIS — D509 Iron deficiency anemia, unspecified: Secondary | ICD-10-CM | POA: Diagnosis not present

## 2022-06-23 DIAGNOSIS — N186 End stage renal disease: Secondary | ICD-10-CM | POA: Diagnosis not present

## 2022-06-23 DIAGNOSIS — Z992 Dependence on renal dialysis: Secondary | ICD-10-CM | POA: Diagnosis not present

## 2022-06-23 DIAGNOSIS — N2581 Secondary hyperparathyroidism of renal origin: Secondary | ICD-10-CM | POA: Diagnosis not present

## 2022-06-23 DIAGNOSIS — D631 Anemia in chronic kidney disease: Secondary | ICD-10-CM | POA: Diagnosis not present

## 2022-06-23 DIAGNOSIS — D508 Other iron deficiency anemias: Secondary | ICD-10-CM | POA: Diagnosis not present

## 2022-06-23 DIAGNOSIS — Z23 Encounter for immunization: Secondary | ICD-10-CM | POA: Diagnosis not present

## 2022-06-23 DIAGNOSIS — D689 Coagulation defect, unspecified: Secondary | ICD-10-CM | POA: Diagnosis not present

## 2022-06-23 DIAGNOSIS — D509 Iron deficiency anemia, unspecified: Secondary | ICD-10-CM | POA: Diagnosis not present

## 2022-06-25 DIAGNOSIS — Z992 Dependence on renal dialysis: Secondary | ICD-10-CM | POA: Diagnosis not present

## 2022-06-25 DIAGNOSIS — D509 Iron deficiency anemia, unspecified: Secondary | ICD-10-CM | POA: Diagnosis not present

## 2022-06-25 DIAGNOSIS — D508 Other iron deficiency anemias: Secondary | ICD-10-CM | POA: Diagnosis not present

## 2022-06-25 DIAGNOSIS — Z23 Encounter for immunization: Secondary | ICD-10-CM | POA: Diagnosis not present

## 2022-06-25 DIAGNOSIS — N2581 Secondary hyperparathyroidism of renal origin: Secondary | ICD-10-CM | POA: Diagnosis not present

## 2022-06-25 DIAGNOSIS — D689 Coagulation defect, unspecified: Secondary | ICD-10-CM | POA: Diagnosis not present

## 2022-06-25 DIAGNOSIS — D631 Anemia in chronic kidney disease: Secondary | ICD-10-CM | POA: Diagnosis not present

## 2022-06-25 DIAGNOSIS — N186 End stage renal disease: Secondary | ICD-10-CM | POA: Diagnosis not present

## 2022-06-27 DIAGNOSIS — N186 End stage renal disease: Secondary | ICD-10-CM | POA: Diagnosis not present

## 2022-06-27 DIAGNOSIS — D508 Other iron deficiency anemias: Secondary | ICD-10-CM | POA: Diagnosis not present

## 2022-06-27 DIAGNOSIS — D689 Coagulation defect, unspecified: Secondary | ICD-10-CM | POA: Diagnosis not present

## 2022-06-27 DIAGNOSIS — D509 Iron deficiency anemia, unspecified: Secondary | ICD-10-CM | POA: Diagnosis not present

## 2022-06-27 DIAGNOSIS — Z23 Encounter for immunization: Secondary | ICD-10-CM | POA: Diagnosis not present

## 2022-06-27 DIAGNOSIS — N2581 Secondary hyperparathyroidism of renal origin: Secondary | ICD-10-CM | POA: Diagnosis not present

## 2022-06-27 DIAGNOSIS — D631 Anemia in chronic kidney disease: Secondary | ICD-10-CM | POA: Diagnosis not present

## 2022-06-27 DIAGNOSIS — Z992 Dependence on renal dialysis: Secondary | ICD-10-CM | POA: Diagnosis not present

## 2022-06-30 DIAGNOSIS — N186 End stage renal disease: Secondary | ICD-10-CM | POA: Diagnosis not present

## 2022-06-30 DIAGNOSIS — D508 Other iron deficiency anemias: Secondary | ICD-10-CM | POA: Diagnosis not present

## 2022-06-30 DIAGNOSIS — D631 Anemia in chronic kidney disease: Secondary | ICD-10-CM | POA: Diagnosis not present

## 2022-06-30 DIAGNOSIS — Z23 Encounter for immunization: Secondary | ICD-10-CM | POA: Diagnosis not present

## 2022-06-30 DIAGNOSIS — D509 Iron deficiency anemia, unspecified: Secondary | ICD-10-CM | POA: Diagnosis not present

## 2022-06-30 DIAGNOSIS — D689 Coagulation defect, unspecified: Secondary | ICD-10-CM | POA: Diagnosis not present

## 2022-06-30 DIAGNOSIS — Z992 Dependence on renal dialysis: Secondary | ICD-10-CM | POA: Diagnosis not present

## 2022-06-30 DIAGNOSIS — N2581 Secondary hyperparathyroidism of renal origin: Secondary | ICD-10-CM | POA: Diagnosis not present

## 2022-07-02 DIAGNOSIS — Z992 Dependence on renal dialysis: Secondary | ICD-10-CM | POA: Diagnosis not present

## 2022-07-02 DIAGNOSIS — D631 Anemia in chronic kidney disease: Secondary | ICD-10-CM | POA: Diagnosis not present

## 2022-07-02 DIAGNOSIS — N2581 Secondary hyperparathyroidism of renal origin: Secondary | ICD-10-CM | POA: Diagnosis not present

## 2022-07-02 DIAGNOSIS — D508 Other iron deficiency anemias: Secondary | ICD-10-CM | POA: Diagnosis not present

## 2022-07-02 DIAGNOSIS — D689 Coagulation defect, unspecified: Secondary | ICD-10-CM | POA: Diagnosis not present

## 2022-07-02 DIAGNOSIS — N186 End stage renal disease: Secondary | ICD-10-CM | POA: Diagnosis not present

## 2022-07-02 DIAGNOSIS — D509 Iron deficiency anemia, unspecified: Secondary | ICD-10-CM | POA: Diagnosis not present

## 2022-07-02 DIAGNOSIS — Z23 Encounter for immunization: Secondary | ICD-10-CM | POA: Diagnosis not present

## 2022-07-03 DIAGNOSIS — I129 Hypertensive chronic kidney disease with stage 1 through stage 4 chronic kidney disease, or unspecified chronic kidney disease: Secondary | ICD-10-CM | POA: Diagnosis not present

## 2022-07-03 DIAGNOSIS — N186 End stage renal disease: Secondary | ICD-10-CM | POA: Diagnosis not present

## 2022-07-03 DIAGNOSIS — Z992 Dependence on renal dialysis: Secondary | ICD-10-CM | POA: Diagnosis not present

## 2022-07-04 DIAGNOSIS — Z992 Dependence on renal dialysis: Secondary | ICD-10-CM | POA: Diagnosis not present

## 2022-07-04 DIAGNOSIS — D689 Coagulation defect, unspecified: Secondary | ICD-10-CM | POA: Diagnosis not present

## 2022-07-04 DIAGNOSIS — D631 Anemia in chronic kidney disease: Secondary | ICD-10-CM | POA: Diagnosis not present

## 2022-07-04 DIAGNOSIS — N186 End stage renal disease: Secondary | ICD-10-CM | POA: Diagnosis not present

## 2022-07-04 DIAGNOSIS — N2581 Secondary hyperparathyroidism of renal origin: Secondary | ICD-10-CM | POA: Diagnosis not present

## 2022-07-07 DIAGNOSIS — N186 End stage renal disease: Secondary | ICD-10-CM | POA: Diagnosis not present

## 2022-07-07 DIAGNOSIS — D689 Coagulation defect, unspecified: Secondary | ICD-10-CM | POA: Diagnosis not present

## 2022-07-07 DIAGNOSIS — Z992 Dependence on renal dialysis: Secondary | ICD-10-CM | POA: Diagnosis not present

## 2022-07-07 DIAGNOSIS — D631 Anemia in chronic kidney disease: Secondary | ICD-10-CM | POA: Diagnosis not present

## 2022-07-07 DIAGNOSIS — N2581 Secondary hyperparathyroidism of renal origin: Secondary | ICD-10-CM | POA: Diagnosis not present

## 2022-07-09 DIAGNOSIS — Z992 Dependence on renal dialysis: Secondary | ICD-10-CM | POA: Diagnosis not present

## 2022-07-09 DIAGNOSIS — D689 Coagulation defect, unspecified: Secondary | ICD-10-CM | POA: Diagnosis not present

## 2022-07-09 DIAGNOSIS — D631 Anemia in chronic kidney disease: Secondary | ICD-10-CM | POA: Diagnosis not present

## 2022-07-09 DIAGNOSIS — N186 End stage renal disease: Secondary | ICD-10-CM | POA: Diagnosis not present

## 2022-07-09 DIAGNOSIS — N2581 Secondary hyperparathyroidism of renal origin: Secondary | ICD-10-CM | POA: Diagnosis not present

## 2022-07-11 DIAGNOSIS — D631 Anemia in chronic kidney disease: Secondary | ICD-10-CM | POA: Diagnosis not present

## 2022-07-11 DIAGNOSIS — N186 End stage renal disease: Secondary | ICD-10-CM | POA: Diagnosis not present

## 2022-07-11 DIAGNOSIS — Z992 Dependence on renal dialysis: Secondary | ICD-10-CM | POA: Diagnosis not present

## 2022-07-11 DIAGNOSIS — D689 Coagulation defect, unspecified: Secondary | ICD-10-CM | POA: Diagnosis not present

## 2022-07-11 DIAGNOSIS — N2581 Secondary hyperparathyroidism of renal origin: Secondary | ICD-10-CM | POA: Diagnosis not present

## 2022-07-14 DIAGNOSIS — D689 Coagulation defect, unspecified: Secondary | ICD-10-CM | POA: Diagnosis not present

## 2022-07-14 DIAGNOSIS — N2581 Secondary hyperparathyroidism of renal origin: Secondary | ICD-10-CM | POA: Diagnosis not present

## 2022-07-14 DIAGNOSIS — Z992 Dependence on renal dialysis: Secondary | ICD-10-CM | POA: Diagnosis not present

## 2022-07-14 DIAGNOSIS — D631 Anemia in chronic kidney disease: Secondary | ICD-10-CM | POA: Diagnosis not present

## 2022-07-14 DIAGNOSIS — N186 End stage renal disease: Secondary | ICD-10-CM | POA: Diagnosis not present

## 2022-07-16 DIAGNOSIS — N2581 Secondary hyperparathyroidism of renal origin: Secondary | ICD-10-CM | POA: Diagnosis not present

## 2022-07-16 DIAGNOSIS — D631 Anemia in chronic kidney disease: Secondary | ICD-10-CM | POA: Diagnosis not present

## 2022-07-16 DIAGNOSIS — Z992 Dependence on renal dialysis: Secondary | ICD-10-CM | POA: Diagnosis not present

## 2022-07-16 DIAGNOSIS — N186 End stage renal disease: Secondary | ICD-10-CM | POA: Diagnosis not present

## 2022-07-16 DIAGNOSIS — D689 Coagulation defect, unspecified: Secondary | ICD-10-CM | POA: Diagnosis not present

## 2022-07-18 DIAGNOSIS — N186 End stage renal disease: Secondary | ICD-10-CM | POA: Diagnosis not present

## 2022-07-18 DIAGNOSIS — D689 Coagulation defect, unspecified: Secondary | ICD-10-CM | POA: Diagnosis not present

## 2022-07-18 DIAGNOSIS — D631 Anemia in chronic kidney disease: Secondary | ICD-10-CM | POA: Diagnosis not present

## 2022-07-18 DIAGNOSIS — Z992 Dependence on renal dialysis: Secondary | ICD-10-CM | POA: Diagnosis not present

## 2022-07-18 DIAGNOSIS — N2581 Secondary hyperparathyroidism of renal origin: Secondary | ICD-10-CM | POA: Diagnosis not present

## 2022-07-21 DIAGNOSIS — N2581 Secondary hyperparathyroidism of renal origin: Secondary | ICD-10-CM | POA: Diagnosis not present

## 2022-07-21 DIAGNOSIS — D631 Anemia in chronic kidney disease: Secondary | ICD-10-CM | POA: Diagnosis not present

## 2022-07-21 DIAGNOSIS — D689 Coagulation defect, unspecified: Secondary | ICD-10-CM | POA: Diagnosis not present

## 2022-07-21 DIAGNOSIS — N186 End stage renal disease: Secondary | ICD-10-CM | POA: Diagnosis not present

## 2022-07-21 DIAGNOSIS — Z992 Dependence on renal dialysis: Secondary | ICD-10-CM | POA: Diagnosis not present

## 2022-07-23 DIAGNOSIS — Z992 Dependence on renal dialysis: Secondary | ICD-10-CM | POA: Diagnosis not present

## 2022-07-23 DIAGNOSIS — D631 Anemia in chronic kidney disease: Secondary | ICD-10-CM | POA: Diagnosis not present

## 2022-07-23 DIAGNOSIS — N2581 Secondary hyperparathyroidism of renal origin: Secondary | ICD-10-CM | POA: Diagnosis not present

## 2022-07-23 DIAGNOSIS — D689 Coagulation defect, unspecified: Secondary | ICD-10-CM | POA: Diagnosis not present

## 2022-07-23 DIAGNOSIS — N186 End stage renal disease: Secondary | ICD-10-CM | POA: Diagnosis not present

## 2022-07-25 DIAGNOSIS — N2581 Secondary hyperparathyroidism of renal origin: Secondary | ICD-10-CM | POA: Diagnosis not present

## 2022-07-25 DIAGNOSIS — Z992 Dependence on renal dialysis: Secondary | ICD-10-CM | POA: Diagnosis not present

## 2022-07-25 DIAGNOSIS — D631 Anemia in chronic kidney disease: Secondary | ICD-10-CM | POA: Diagnosis not present

## 2022-07-25 DIAGNOSIS — D689 Coagulation defect, unspecified: Secondary | ICD-10-CM | POA: Diagnosis not present

## 2022-07-25 DIAGNOSIS — N186 End stage renal disease: Secondary | ICD-10-CM | POA: Diagnosis not present

## 2022-07-28 DIAGNOSIS — D631 Anemia in chronic kidney disease: Secondary | ICD-10-CM | POA: Diagnosis not present

## 2022-07-28 DIAGNOSIS — Z992 Dependence on renal dialysis: Secondary | ICD-10-CM | POA: Diagnosis not present

## 2022-07-28 DIAGNOSIS — D689 Coagulation defect, unspecified: Secondary | ICD-10-CM | POA: Diagnosis not present

## 2022-07-28 DIAGNOSIS — N2581 Secondary hyperparathyroidism of renal origin: Secondary | ICD-10-CM | POA: Diagnosis not present

## 2022-07-28 DIAGNOSIS — N186 End stage renal disease: Secondary | ICD-10-CM | POA: Diagnosis not present

## 2022-07-30 DIAGNOSIS — D689 Coagulation defect, unspecified: Secondary | ICD-10-CM | POA: Diagnosis not present

## 2022-07-30 DIAGNOSIS — N186 End stage renal disease: Secondary | ICD-10-CM | POA: Diagnosis not present

## 2022-07-30 DIAGNOSIS — Z992 Dependence on renal dialysis: Secondary | ICD-10-CM | POA: Diagnosis not present

## 2022-07-30 DIAGNOSIS — N2581 Secondary hyperparathyroidism of renal origin: Secondary | ICD-10-CM | POA: Diagnosis not present

## 2022-07-30 DIAGNOSIS — D631 Anemia in chronic kidney disease: Secondary | ICD-10-CM | POA: Diagnosis not present

## 2022-08-01 DIAGNOSIS — N2581 Secondary hyperparathyroidism of renal origin: Secondary | ICD-10-CM | POA: Diagnosis not present

## 2022-08-01 DIAGNOSIS — D631 Anemia in chronic kidney disease: Secondary | ICD-10-CM | POA: Diagnosis not present

## 2022-08-01 DIAGNOSIS — Z992 Dependence on renal dialysis: Secondary | ICD-10-CM | POA: Diagnosis not present

## 2022-08-01 DIAGNOSIS — N186 End stage renal disease: Secondary | ICD-10-CM | POA: Diagnosis not present

## 2022-08-01 DIAGNOSIS — D689 Coagulation defect, unspecified: Secondary | ICD-10-CM | POA: Diagnosis not present

## 2022-08-02 DIAGNOSIS — Z992 Dependence on renal dialysis: Secondary | ICD-10-CM | POA: Diagnosis not present

## 2022-08-02 DIAGNOSIS — I129 Hypertensive chronic kidney disease with stage 1 through stage 4 chronic kidney disease, or unspecified chronic kidney disease: Secondary | ICD-10-CM | POA: Diagnosis not present

## 2022-08-02 DIAGNOSIS — N186 End stage renal disease: Secondary | ICD-10-CM | POA: Diagnosis not present

## 2022-08-03 ENCOUNTER — Other Ambulatory Visit: Payer: Self-pay | Admitting: Family

## 2022-08-03 DIAGNOSIS — E7800 Pure hypercholesterolemia, unspecified: Secondary | ICD-10-CM

## 2022-08-04 DIAGNOSIS — D689 Coagulation defect, unspecified: Secondary | ICD-10-CM | POA: Diagnosis not present

## 2022-08-04 DIAGNOSIS — Z992 Dependence on renal dialysis: Secondary | ICD-10-CM | POA: Diagnosis not present

## 2022-08-04 DIAGNOSIS — N2581 Secondary hyperparathyroidism of renal origin: Secondary | ICD-10-CM | POA: Diagnosis not present

## 2022-08-04 DIAGNOSIS — N186 End stage renal disease: Secondary | ICD-10-CM | POA: Diagnosis not present

## 2022-08-06 DIAGNOSIS — Z992 Dependence on renal dialysis: Secondary | ICD-10-CM | POA: Diagnosis not present

## 2022-08-06 DIAGNOSIS — N2581 Secondary hyperparathyroidism of renal origin: Secondary | ICD-10-CM | POA: Diagnosis not present

## 2022-08-06 DIAGNOSIS — D689 Coagulation defect, unspecified: Secondary | ICD-10-CM | POA: Diagnosis not present

## 2022-08-06 DIAGNOSIS — N186 End stage renal disease: Secondary | ICD-10-CM | POA: Diagnosis not present

## 2022-08-08 DIAGNOSIS — N186 End stage renal disease: Secondary | ICD-10-CM | POA: Diagnosis not present

## 2022-08-08 DIAGNOSIS — D689 Coagulation defect, unspecified: Secondary | ICD-10-CM | POA: Diagnosis not present

## 2022-08-08 DIAGNOSIS — Z992 Dependence on renal dialysis: Secondary | ICD-10-CM | POA: Diagnosis not present

## 2022-08-08 DIAGNOSIS — N2581 Secondary hyperparathyroidism of renal origin: Secondary | ICD-10-CM | POA: Diagnosis not present

## 2022-08-11 DIAGNOSIS — N186 End stage renal disease: Secondary | ICD-10-CM | POA: Diagnosis not present

## 2022-08-11 DIAGNOSIS — D689 Coagulation defect, unspecified: Secondary | ICD-10-CM | POA: Diagnosis not present

## 2022-08-11 DIAGNOSIS — N2581 Secondary hyperparathyroidism of renal origin: Secondary | ICD-10-CM | POA: Diagnosis not present

## 2022-08-11 DIAGNOSIS — Z992 Dependence on renal dialysis: Secondary | ICD-10-CM | POA: Diagnosis not present

## 2022-08-13 DIAGNOSIS — Z992 Dependence on renal dialysis: Secondary | ICD-10-CM | POA: Diagnosis not present

## 2022-08-13 DIAGNOSIS — D689 Coagulation defect, unspecified: Secondary | ICD-10-CM | POA: Diagnosis not present

## 2022-08-13 DIAGNOSIS — N2581 Secondary hyperparathyroidism of renal origin: Secondary | ICD-10-CM | POA: Diagnosis not present

## 2022-08-13 DIAGNOSIS — N186 End stage renal disease: Secondary | ICD-10-CM | POA: Diagnosis not present

## 2022-08-15 DIAGNOSIS — N186 End stage renal disease: Secondary | ICD-10-CM | POA: Diagnosis not present

## 2022-08-15 DIAGNOSIS — Z992 Dependence on renal dialysis: Secondary | ICD-10-CM | POA: Diagnosis not present

## 2022-08-15 DIAGNOSIS — N2581 Secondary hyperparathyroidism of renal origin: Secondary | ICD-10-CM | POA: Diagnosis not present

## 2022-08-15 DIAGNOSIS — D689 Coagulation defect, unspecified: Secondary | ICD-10-CM | POA: Diagnosis not present

## 2022-08-18 DIAGNOSIS — D689 Coagulation defect, unspecified: Secondary | ICD-10-CM | POA: Diagnosis not present

## 2022-08-18 DIAGNOSIS — Z992 Dependence on renal dialysis: Secondary | ICD-10-CM | POA: Diagnosis not present

## 2022-08-18 DIAGNOSIS — N2581 Secondary hyperparathyroidism of renal origin: Secondary | ICD-10-CM | POA: Diagnosis not present

## 2022-08-18 DIAGNOSIS — N186 End stage renal disease: Secondary | ICD-10-CM | POA: Diagnosis not present

## 2022-08-20 DIAGNOSIS — N2581 Secondary hyperparathyroidism of renal origin: Secondary | ICD-10-CM | POA: Diagnosis not present

## 2022-08-20 DIAGNOSIS — N186 End stage renal disease: Secondary | ICD-10-CM | POA: Diagnosis not present

## 2022-08-20 DIAGNOSIS — D689 Coagulation defect, unspecified: Secondary | ICD-10-CM | POA: Diagnosis not present

## 2022-08-20 DIAGNOSIS — Z992 Dependence on renal dialysis: Secondary | ICD-10-CM | POA: Diagnosis not present

## 2022-08-22 DIAGNOSIS — N2581 Secondary hyperparathyroidism of renal origin: Secondary | ICD-10-CM | POA: Diagnosis not present

## 2022-08-22 DIAGNOSIS — D689 Coagulation defect, unspecified: Secondary | ICD-10-CM | POA: Diagnosis not present

## 2022-08-22 DIAGNOSIS — N186 End stage renal disease: Secondary | ICD-10-CM | POA: Diagnosis not present

## 2022-08-22 DIAGNOSIS — Z992 Dependence on renal dialysis: Secondary | ICD-10-CM | POA: Diagnosis not present

## 2022-08-25 DIAGNOSIS — N2581 Secondary hyperparathyroidism of renal origin: Secondary | ICD-10-CM | POA: Diagnosis not present

## 2022-08-25 DIAGNOSIS — Z992 Dependence on renal dialysis: Secondary | ICD-10-CM | POA: Diagnosis not present

## 2022-08-25 DIAGNOSIS — D689 Coagulation defect, unspecified: Secondary | ICD-10-CM | POA: Diagnosis not present

## 2022-08-25 DIAGNOSIS — N186 End stage renal disease: Secondary | ICD-10-CM | POA: Diagnosis not present

## 2022-08-27 ENCOUNTER — Telehealth: Payer: Self-pay | Admitting: *Deleted

## 2022-08-27 DIAGNOSIS — N186 End stage renal disease: Secondary | ICD-10-CM | POA: Diagnosis not present

## 2022-08-27 DIAGNOSIS — D689 Coagulation defect, unspecified: Secondary | ICD-10-CM | POA: Diagnosis not present

## 2022-08-27 DIAGNOSIS — N2581 Secondary hyperparathyroidism of renal origin: Secondary | ICD-10-CM | POA: Diagnosis not present

## 2022-08-27 DIAGNOSIS — Z992 Dependence on renal dialysis: Secondary | ICD-10-CM | POA: Diagnosis not present

## 2022-08-27 NOTE — Telephone Encounter (Signed)
CALLED PATIENT TO INFORM OF CT FOR 09-08-22 - ARRIVAL TIME- 12:15 PM @ [facility], NO RESTRICTIONS TO TEST, PATIENT TO RECEIVE RESULTS FROM DR. KINARD ON 09-14-22 @ 10 AM FOR RESULTS, SPOKE WITH PATIENT AND SHE IS AWARE OF THESE APPTS. AND THE INSTRUCTIONS

## 2022-08-29 DIAGNOSIS — N2581 Secondary hyperparathyroidism of renal origin: Secondary | ICD-10-CM | POA: Diagnosis not present

## 2022-08-29 DIAGNOSIS — N186 End stage renal disease: Secondary | ICD-10-CM | POA: Diagnosis not present

## 2022-08-29 DIAGNOSIS — D689 Coagulation defect, unspecified: Secondary | ICD-10-CM | POA: Diagnosis not present

## 2022-08-29 DIAGNOSIS — Z992 Dependence on renal dialysis: Secondary | ICD-10-CM | POA: Diagnosis not present

## 2022-09-01 DIAGNOSIS — D689 Coagulation defect, unspecified: Secondary | ICD-10-CM | POA: Diagnosis not present

## 2022-09-01 DIAGNOSIS — Z992 Dependence on renal dialysis: Secondary | ICD-10-CM | POA: Diagnosis not present

## 2022-09-01 DIAGNOSIS — N2581 Secondary hyperparathyroidism of renal origin: Secondary | ICD-10-CM | POA: Diagnosis not present

## 2022-09-01 DIAGNOSIS — N186 End stage renal disease: Secondary | ICD-10-CM | POA: Diagnosis not present

## 2022-09-02 ENCOUNTER — Ambulatory Visit (INDEPENDENT_AMBULATORY_CARE_PROVIDER_SITE_OTHER): Payer: 59 | Admitting: Nurse Practitioner

## 2022-09-02 ENCOUNTER — Encounter (INDEPENDENT_AMBULATORY_CARE_PROVIDER_SITE_OTHER): Payer: 59

## 2022-09-02 DIAGNOSIS — Z992 Dependence on renal dialysis: Secondary | ICD-10-CM | POA: Diagnosis not present

## 2022-09-02 DIAGNOSIS — N186 End stage renal disease: Secondary | ICD-10-CM | POA: Diagnosis not present

## 2022-09-02 DIAGNOSIS — I129 Hypertensive chronic kidney disease with stage 1 through stage 4 chronic kidney disease, or unspecified chronic kidney disease: Secondary | ICD-10-CM | POA: Diagnosis not present

## 2022-09-03 DIAGNOSIS — D631 Anemia in chronic kidney disease: Secondary | ICD-10-CM | POA: Diagnosis not present

## 2022-09-03 DIAGNOSIS — N186 End stage renal disease: Secondary | ICD-10-CM | POA: Diagnosis not present

## 2022-09-03 DIAGNOSIS — Z992 Dependence on renal dialysis: Secondary | ICD-10-CM | POA: Diagnosis not present

## 2022-09-03 DIAGNOSIS — N2581 Secondary hyperparathyroidism of renal origin: Secondary | ICD-10-CM | POA: Diagnosis not present

## 2022-09-03 DIAGNOSIS — D689 Coagulation defect, unspecified: Secondary | ICD-10-CM | POA: Diagnosis not present

## 2022-09-05 DIAGNOSIS — N2581 Secondary hyperparathyroidism of renal origin: Secondary | ICD-10-CM | POA: Diagnosis not present

## 2022-09-05 DIAGNOSIS — Z992 Dependence on renal dialysis: Secondary | ICD-10-CM | POA: Diagnosis not present

## 2022-09-05 DIAGNOSIS — N186 End stage renal disease: Secondary | ICD-10-CM | POA: Diagnosis not present

## 2022-09-05 DIAGNOSIS — D631 Anemia in chronic kidney disease: Secondary | ICD-10-CM | POA: Diagnosis not present

## 2022-09-05 DIAGNOSIS — D689 Coagulation defect, unspecified: Secondary | ICD-10-CM | POA: Diagnosis not present

## 2022-09-08 ENCOUNTER — Ambulatory Visit (HOSPITAL_COMMUNITY): Payer: 59

## 2022-09-08 DIAGNOSIS — D689 Coagulation defect, unspecified: Secondary | ICD-10-CM | POA: Diagnosis not present

## 2022-09-08 DIAGNOSIS — D631 Anemia in chronic kidney disease: Secondary | ICD-10-CM | POA: Diagnosis not present

## 2022-09-08 DIAGNOSIS — N2581 Secondary hyperparathyroidism of renal origin: Secondary | ICD-10-CM | POA: Diagnosis not present

## 2022-09-08 DIAGNOSIS — N186 End stage renal disease: Secondary | ICD-10-CM | POA: Diagnosis not present

## 2022-09-08 DIAGNOSIS — Z992 Dependence on renal dialysis: Secondary | ICD-10-CM | POA: Diagnosis not present

## 2022-09-09 ENCOUNTER — Ambulatory Visit (HOSPITAL_COMMUNITY)
Admission: RE | Admit: 2022-09-09 | Discharge: 2022-09-09 | Disposition: A | Discharge status: Home / Self Care | Payer: 59 | Source: Ambulatory Visit | Attending: Radiation Oncology | Admitting: Radiation Oncology

## 2022-09-09 DIAGNOSIS — I700 Atherosclerosis of aorta: Secondary | ICD-10-CM | POA: Diagnosis not present

## 2022-09-09 DIAGNOSIS — C3491 Malignant neoplasm of unspecified part of right bronchus or lung: Secondary | ICD-10-CM | POA: Insufficient documentation

## 2022-09-09 DIAGNOSIS — C3490 Malignant neoplasm of unspecified part of unspecified bronchus or lung: Secondary | ICD-10-CM | POA: Diagnosis not present

## 2022-09-09 DIAGNOSIS — J439 Emphysema, unspecified: Secondary | ICD-10-CM | POA: Diagnosis not present

## 2022-09-10 DIAGNOSIS — D689 Coagulation defect, unspecified: Secondary | ICD-10-CM | POA: Diagnosis not present

## 2022-09-10 DIAGNOSIS — N2581 Secondary hyperparathyroidism of renal origin: Secondary | ICD-10-CM | POA: Diagnosis not present

## 2022-09-10 DIAGNOSIS — D631 Anemia in chronic kidney disease: Secondary | ICD-10-CM | POA: Diagnosis not present

## 2022-09-10 DIAGNOSIS — N186 End stage renal disease: Secondary | ICD-10-CM | POA: Diagnosis not present

## 2022-09-10 DIAGNOSIS — Z992 Dependence on renal dialysis: Secondary | ICD-10-CM | POA: Diagnosis not present

## 2022-09-12 DIAGNOSIS — D631 Anemia in chronic kidney disease: Secondary | ICD-10-CM | POA: Diagnosis not present

## 2022-09-12 DIAGNOSIS — Z992 Dependence on renal dialysis: Secondary | ICD-10-CM | POA: Diagnosis not present

## 2022-09-12 DIAGNOSIS — N2581 Secondary hyperparathyroidism of renal origin: Secondary | ICD-10-CM | POA: Diagnosis not present

## 2022-09-12 DIAGNOSIS — D689 Coagulation defect, unspecified: Secondary | ICD-10-CM | POA: Diagnosis not present

## 2022-09-12 DIAGNOSIS — N186 End stage renal disease: Secondary | ICD-10-CM | POA: Diagnosis not present

## 2022-09-13 NOTE — Progress Notes (Signed)
Radiation Oncology         [phone] ________________________________  Name: Kimberly Frost MRN: [id number]  Date: 09/14/2022  DOB: 1950-06-22  Follow-Up Visit Note  CC: Mort Sawyers, FNP  Salley Scarlet, MD  No diagnosis found.  Diagnosis: The primary encounter diagnosis was NSCLC of right lung (HCC). A diagnosis of NSCLC of lower lobe (HCC) was also pertinent to this visit.   Stage IB (cT2, N0, M0) NSCLC of the RUL  Interval Since Last Radiation: 1 year and 19 days   Intent: Curative  Radiation Treatment Dates: 08/12/2021 through 08/25/2021 Site Technique Total Dose (Gy) Dose per Fx (Gy) Completed Fx Beam Energies  Lung, Right: Lung_R IMRT 50/50 5 10/10 [id number]   Narrative:  The patient returns today for routine follow-up and to review recent imaging. She was last seen here for follow-up on 04/06/22.              Since her last visit, the patient presented to Dr. Wyn Quaker at [facility] on 04/27/22 with c/o increasing issues with dialysis (pulling clots and bleeding with decannulation). She subsequently underwent angiography for correction of these issues on that date.  Her most recent chest CT without contrast on 09/09/22 demonstrated: ***.        ***  Allergies:  has No Known Allergies.  Meds: Current Outpatient Medications  Medication Sig Dispense Refill   acetaminophen (TYLENOL) 500 MG tablet Take 1,000 mg by mouth every 6 (six) hours as needed for moderate pain or headache.     atorvastatin (LIPITOR) 40 MG tablet TAKE 1 TABLET BY MOUTH EVERY DAY 90 tablet 3   calcitRIOL (ROCALTROL) 0.25 MCG capsule Take 1 capsule (0.25 mcg total) by mouth every Tuesday, Thursday, and Saturday at 6 PM. 15 capsule 0   calcium carbonate (OS-CAL - DOSED IN MG OF ELEMENTAL CALCIUM) 1250 (500 Ca) MG tablet Take 1 tablet (1,250 mg total) by mouth daily with breakfast. (Patient not taking: Reported on 04/27/2022) 30 tablet 0   cyclobenzaprine (FLEXERIL) 5 MG tablet  Take 5 mg by mouth 2 (two) times daily as needed.     denosumab (PROLIA) 60 MG/ML SOSY injection INJECT 1 SYRINGE UNDER THE SKIN ONCE EVERY 6 MONTHS (Patient not taking: Reported on 04/27/2022) 1 mL 0   gabapentin (NEURONTIN) 100 MG capsule Take 100-200 mg by mouth at bedtime.     midodrine (PROAMATINE) 10 MG tablet Take 5 mg by mouth See admin instructions. Take 1 tablet (5mg ) by mouth 30 minutes before dialysis and take 1 tablet (5mg ) by mouth during dialysis if needed for BP support     multivitamin (RENA-VIT) TABS tablet Take 1 tablet by mouth daily.     promethazine (PHENERGAN) 25 MG tablet Take 25 mg by mouth 2 (two) times daily as needed for nausea or vomiting. (Patient not taking: Reported on 04/27/2022)     sucroferric oxyhydroxide (VELPHORO) 500 MG chewable tablet Chew 500-1,000 mg by mouth See admin instructions. Take 2 tablets (1000mg ) by mouth three times daily with meals and take 1 tablet (500mg ) by mouth with snacks     VITAMIN D PO Take by mouth.     No current facility-administered medications for this encounter.    Physical Findings: The patient is in no acute distress. Patient is alert and oriented.  vitals were not taken for this visit. .  No significant changes. Lungs are clear to auscultation bilaterally. Heart has regular rate and rhythm. No palpable cervical, supraclavicular, or  axillary adenopathy. Abdomen soft, non-tender, normal bowel sounds.   Lab Findings: Lab Results  Component Value Date   WBC 6.7 11/09/2021   HGB 11.4 (L) 11/09/2021   HCT 34.3 (L) 11/09/2021   MCV 84.9 11/09/2021   PLT 258 11/09/2021    Radiographic Findings: No results found.  Impression: The primary encounter diagnosis was NSCLC of right lung (HCC). A diagnosis of NSCLC of lower lobe (HCC) was also pertinent to this visit.   Stage IB (cT2, N0, M0) NSCLC of the RUL  The patient is recovering from the effects of radiation.  ***  Plan:  ***   *** minutes of total time was spent for  this patient encounter, including preparation, face-to-face counseling with the patient and coordination of care, physical exam, and documentation of the encounter. ____________________________________  Billie Lade, PhD, MD  This document serves as a record of services personally performed by Antony Blackbird, MD. It was created on his behalf by Neena Rhymes, a trained medical scribe. The creation of this record is based on the scribe's personal observations and the provider's statements to them. This document has been checked and approved by the attending provider.

## 2022-09-14 ENCOUNTER — Encounter: Payer: Self-pay | Admitting: Radiation Oncology

## 2022-09-14 ENCOUNTER — Other Ambulatory Visit: Payer: Self-pay

## 2022-09-14 ENCOUNTER — Ambulatory Visit
Admission: RE | Admit: 2022-09-14 | Discharge: 2022-09-14 | Disposition: A | Discharge status: Home / Self Care | Payer: 59 | Source: Ambulatory Visit | Attending: Radiation Oncology | Admitting: Radiation Oncology

## 2022-09-14 VITALS — BP 142/73 | HR 102 | Temp 97.5°F | Resp 18 | Ht 66.0 in | Wt 140.2 lb

## 2022-09-14 DIAGNOSIS — J439 Emphysema, unspecified: Secondary | ICD-10-CM | POA: Insufficient documentation

## 2022-09-14 DIAGNOSIS — Z923 Personal history of irradiation: Secondary | ICD-10-CM | POA: Insufficient documentation

## 2022-09-14 DIAGNOSIS — Z79899 Other long term (current) drug therapy: Secondary | ICD-10-CM | POA: Insufficient documentation

## 2022-09-14 DIAGNOSIS — Z87891 Personal history of nicotine dependence: Secondary | ICD-10-CM | POA: Diagnosis not present

## 2022-09-14 DIAGNOSIS — I700 Atherosclerosis of aorta: Secondary | ICD-10-CM | POA: Diagnosis not present

## 2022-09-14 DIAGNOSIS — C3411 Malignant neoplasm of upper lobe, right bronchus or lung: Secondary | ICD-10-CM | POA: Diagnosis not present

## 2022-09-14 DIAGNOSIS — C3431 Malignant neoplasm of lower lobe, right bronchus or lung: Secondary | ICD-10-CM | POA: Insufficient documentation

## 2022-09-14 DIAGNOSIS — R918 Other nonspecific abnormal finding of lung field: Secondary | ICD-10-CM | POA: Insufficient documentation

## 2022-09-14 DIAGNOSIS — C3491 Malignant neoplasm of unspecified part of right bronchus or lung: Secondary | ICD-10-CM

## 2022-09-14 NOTE — Progress Notes (Addendum)
Kimberly Frost is here today for follow up post radiation to the lung.  Lung Side: Right Lung  Does the patient complain of any of the following: Pain:No Shortness of breath w/wo exertion: No Cough: No Hemoptysis: No Pain with swallowing:  No Swallowing/choking concerns: No Appetite: Good Energy Level: Fair Post radiation skin Changes: No   BP (!) 142/73 (BP Location: Left Arm, Patient Position: Sitting)   Pulse (!) 102   Temp (!) 97.5 F (36.4 C) (Temporal)   Resp 18   Ht 5\' 6"  (1.676 m)   Wt 140 lb 4 oz (63.6 kg)   SpO2 100%   BMI 22.64 kg/m

## 2022-09-15 DIAGNOSIS — Z992 Dependence on renal dialysis: Secondary | ICD-10-CM | POA: Diagnosis not present

## 2022-09-15 DIAGNOSIS — D631 Anemia in chronic kidney disease: Secondary | ICD-10-CM | POA: Diagnosis not present

## 2022-09-15 DIAGNOSIS — N186 End stage renal disease: Secondary | ICD-10-CM | POA: Diagnosis not present

## 2022-09-15 DIAGNOSIS — D689 Coagulation defect, unspecified: Secondary | ICD-10-CM | POA: Diagnosis not present

## 2022-09-15 DIAGNOSIS — N2581 Secondary hyperparathyroidism of renal origin: Secondary | ICD-10-CM | POA: Diagnosis not present

## 2022-09-16 ENCOUNTER — Ambulatory Visit (INDEPENDENT_AMBULATORY_CARE_PROVIDER_SITE_OTHER): Payer: 59

## 2022-09-16 ENCOUNTER — Other Ambulatory Visit (INDEPENDENT_AMBULATORY_CARE_PROVIDER_SITE_OTHER): Payer: Self-pay | Admitting: Nurse Practitioner

## 2022-09-16 ENCOUNTER — Other Ambulatory Visit (INDEPENDENT_AMBULATORY_CARE_PROVIDER_SITE_OTHER): Payer: 59

## 2022-09-16 DIAGNOSIS — N186 End stage renal disease: Secondary | ICD-10-CM | POA: Diagnosis not present

## 2022-09-16 DIAGNOSIS — M79604 Pain in right leg: Secondary | ICD-10-CM

## 2022-09-17 DIAGNOSIS — N2581 Secondary hyperparathyroidism of renal origin: Secondary | ICD-10-CM | POA: Diagnosis not present

## 2022-09-17 DIAGNOSIS — D689 Coagulation defect, unspecified: Secondary | ICD-10-CM | POA: Diagnosis not present

## 2022-09-17 DIAGNOSIS — D631 Anemia in chronic kidney disease: Secondary | ICD-10-CM | POA: Diagnosis not present

## 2022-09-17 DIAGNOSIS — N186 End stage renal disease: Secondary | ICD-10-CM | POA: Diagnosis not present

## 2022-09-17 DIAGNOSIS — Z992 Dependence on renal dialysis: Secondary | ICD-10-CM | POA: Diagnosis not present

## 2022-09-19 DIAGNOSIS — D631 Anemia in chronic kidney disease: Secondary | ICD-10-CM | POA: Diagnosis not present

## 2022-09-19 DIAGNOSIS — Z992 Dependence on renal dialysis: Secondary | ICD-10-CM | POA: Diagnosis not present

## 2022-09-19 DIAGNOSIS — N2581 Secondary hyperparathyroidism of renal origin: Secondary | ICD-10-CM | POA: Diagnosis not present

## 2022-09-19 DIAGNOSIS — D689 Coagulation defect, unspecified: Secondary | ICD-10-CM | POA: Diagnosis not present

## 2022-09-19 DIAGNOSIS — N186 End stage renal disease: Secondary | ICD-10-CM | POA: Diagnosis not present

## 2022-09-21 ENCOUNTER — Encounter (INDEPENDENT_AMBULATORY_CARE_PROVIDER_SITE_OTHER): Payer: Self-pay | Admitting: Nurse Practitioner

## 2022-09-21 ENCOUNTER — Ambulatory Visit (INDEPENDENT_AMBULATORY_CARE_PROVIDER_SITE_OTHER): Payer: 59 | Admitting: Nurse Practitioner

## 2022-09-21 VITALS — BP 155/82 | HR 90 | Resp 16 | Wt 137.4 lb

## 2022-09-21 DIAGNOSIS — N186 End stage renal disease: Secondary | ICD-10-CM

## 2022-09-21 DIAGNOSIS — I10 Essential (primary) hypertension: Secondary | ICD-10-CM

## 2022-09-21 DIAGNOSIS — E782 Mixed hyperlipidemia: Secondary | ICD-10-CM | POA: Diagnosis not present

## 2022-09-22 ENCOUNTER — Encounter (INDEPENDENT_AMBULATORY_CARE_PROVIDER_SITE_OTHER): Payer: Self-pay | Admitting: Nurse Practitioner

## 2022-09-22 DIAGNOSIS — N2581 Secondary hyperparathyroidism of renal origin: Secondary | ICD-10-CM | POA: Diagnosis not present

## 2022-09-22 DIAGNOSIS — N186 End stage renal disease: Secondary | ICD-10-CM | POA: Diagnosis not present

## 2022-09-22 DIAGNOSIS — D631 Anemia in chronic kidney disease: Secondary | ICD-10-CM | POA: Diagnosis not present

## 2022-09-22 DIAGNOSIS — Z992 Dependence on renal dialysis: Secondary | ICD-10-CM | POA: Diagnosis not present

## 2022-09-22 DIAGNOSIS — D689 Coagulation defect, unspecified: Secondary | ICD-10-CM | POA: Diagnosis not present

## 2022-09-22 NOTE — Progress Notes (Signed)
Subjective:    Patient ID: Kimberly Frost, female    DOB: 1950/11/19, 72 y.o.   MRN: [id number] Chief Complaint  Patient presents with   Follow-up    Ultrasound results    The patient returns to the office for followup status post intervention of their dialysis access 0325/2024.   Following the intervention the access function has significantly improved, with better flow rates and improved KT/V. The patient has not been experiencing increased bleeding times following decannulation and the patient denies increased recirculation. The patient denies an increase in arm swelling. At the present time the patient denies hand pain.  No recent shortening of the patient's walking distance or new symptoms consistent with claudication.  No history of rest pain symptoms. No new ulcers or wounds of the lower extremities have occurred.  The patient denies amaurosis fugax or recent TIA symptoms. There are no recent neurological changes noted. There is no history of DVT, PE or superficial thrombophlebitis. No recent episodes of angina or shortness of breath documented.   Duplex ultrasound of the AV access shows a patent access.  The previously noted stenosis is improved compared to last study.  Flow volume today is 2687 cc/min       Review of Systems  All other systems reviewed and are negative.      Objective:   Physical Exam Vitals reviewed.  HENT:     Head: Normocephalic.  Cardiovascular:     Rate and Rhythm: Normal rate.     Pulses:          Radial pulses are 2+ on the left side.     Arteriovenous access: Left arteriovenous access is present.    Comments: Good thrill and bruit Pulmonary:     Effort: Pulmonary effort is normal.  Skin:    General: Skin is warm and dry.  Neurological:     Mental Status: She is alert.     BP (!) 155/82 (BP Location: Right Arm)   Pulse 90   Resp 16   Wt 137 lb 6.4 oz (62.3 kg)   BMI 22.18 kg/m   Past Medical History:  Diagnosis Date    Chronic kidney disease    Edema    RLE   Hepatitis    Hep C   History of blood transfusion    History of radiation therapy    right lung 02/24/2021-03/10/2021  Dr Antony Blackbird   Hypertension    Lung cancer [facility])    Wears dentures     Social History   Socioeconomic History   Marital status: Significant Other    Spouse name: Not on file   Number of children: 0   Years of education: Not on file   Highest education level: Not on file  Occupational History   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   Tobacco Use   Smoking status: Former    Current packs/day: 0.00    Types: Cigarettes    Quit date: 1990    Years since quitting: 34.6   Smokeless tobacco: Never  Vaping Use   Vaping status: Never Used  Substance and Sexual Activity   Alcohol use: Not Currently    Alcohol/week: 6.0 standard drinks of alcohol    Types: 6 Cans of beer per week    Comment: quit 3-4 years ago   Drug use: Not Currently    Types: Marijuana    Comment: Years ago   Sexual activity: Not Currently  Other Topics Concern   Not on file  Social History Narrative   Lives at home in private residence with fiance' Joe    Social Determinants of Health   Financial Resource Strain: Low Risk  (11/19/2021)   Overall Financial Resource Strain (CARDIA)    Difficulty of Paying Living Expenses: Not hard at all  Food Insecurity: No Food Insecurity (11/19/2021)   Hunger Vital Sign    Worried About Running Out of Food in the Last Year: Never true    Ran Out of Food in the Last Year: Never true  Transportation Needs: No Transportation Needs (11/19/2021)   PRAPARE - Administrator, Civil Service (Medical): No    Lack of Transportation (Non-Medical): No  Physical Activity: Insufficiently Active (11/19/2021)   Exercise Vital Sign    Days of Exercise per Week: 3 days    Minutes of Exercise per Session: 20 min  Stress: Stress Concern Present (11/19/2021)   [employer] of Occupational Health - Occupational  Stress Questionnaire    Feeling of Stress : To some extent  Social Connections: Moderately Isolated (11/19/2021)   Social Connection and Isolation Panel [NHANES]    Frequency of Communication with Friends and Family: Three times a week    Frequency of Social Gatherings with Friends and Family: Three times a week    Attends Religious Services: More than 4 times per year    Active Member of Clubs or Organizations: No    Attends Banker Meetings: Never    Marital Status: Never married  Intimate Partner Violence: Not At Risk (11/19/2021)   Humiliation, Afraid, Rape, and Kick questionnaire    Fear of Current or Ex-Partner: No    Emotionally Abused: No    Physically Abused: No    Sexually Abused: No    Past Surgical History:  Procedure Laterality Date   A/V FISTULAGRAM Left 04/11/2019   Procedure: A/V FISTULAGRAM;  Surgeon: Renford Dills, MD;  Location: [facility];  Service: Cardiovascular;  Laterality: Left;   A/V FISTULAGRAM Left 05/01/2019   Procedure: A/V FISTULAGRAM;  Surgeon: Annice Needy, MD;  Location: [facility];  Service: Cardiovascular;  Laterality: Left;   A/V FISTULAGRAM Left 01/15/2020   Procedure: A/V FISTULAGRAM;  Surgeon: Annice Needy, MD;  Location: [facility];  Service: Cardiovascular;  Laterality: Left;   A/V FISTULAGRAM Left 04/22/2020   Procedure: A/V FISTULAGRAM;  Surgeon: Annice Needy, MD;  Location: [facility];  Service: Cardiovascular;  Laterality: Left;   A/V FISTULAGRAM Left 06/25/2021   Procedure: A/V Fistulagram;  Surgeon: Renford Dills, MD;  Location: [facility];  Service: Cardiovascular;  Laterality: Left;   A/V FISTULAGRAM Left 04/27/2022   Procedure: A/V Fistulagram;  Surgeon: Annice Needy, MD;  Location: [facility];  Service: Cardiovascular;  Laterality: Left;   AV FISTULA PLACEMENT Left 06/16/2018   Procedure: Left Arm ARTERIOVENOUS (AV) FISTULA CREATION;  Surgeon: Cephus Shelling, MD;  Location: [facility] OR;  Service: Vascular;  Laterality: Left;   MULTIPLE TOOTH EXTRACTIONS     MYOMECTOMY      Family History  Problem Relation Age of Onset   Diabetes Mother    Hypertension Mother    Breast cancer Mother 56   Bone cancer Father    Stroke Brother    Diabetes Brother    Drug abuse Brother    Breast cancer Maternal Aunt 50   Breast cancer Maternal Aunt 87   Kidney cancer Maternal Grandmother  No Known Allergies     Latest Ref Rng & Units 11/09/2021   10:06 AM 08/04/2021    8:08 AM 07/31/2019   11:25 AM  CBC  WBC 4.0 - 10.5 K/uL 6.7  4.8  8.3   Hemoglobin 12.0 - 15.0 g/dL 52.8  41.3  24.4   Hematocrit 36.0 - 46.0 % 34.3  32.7  30.9   Platelets 150 - 400 K/uL 258  208.0  247.0       CMP     Component Value Date/Time   NA 136 11/10/2021 0454   NA 138 06/23/2018 0000   K 4.5 11/10/2021 0454   K 4.9 06/23/2018 0000   CL 94 (L) 11/10/2021 0454   CO2 26 11/10/2021 0454   GLUCOSE 105 (H) 11/10/2021 0454   BUN 50 (H) 11/10/2021 0454   BUN 61 06/23/2018 0000   CREATININE 9.24 (H) 11/10/2021 0454   CREATININE 7.13 06/23/2018 0000   CALCIUM 6.6 (L) 11/10/2021 0454   CALCIUM 8.8 06/23/2018 0000   PROT 7.9 11/09/2021 1006   ALBUMIN 4.2 11/09/2021 1006   AST 40 11/09/2021 1006   ALT 28 11/09/2021 1006   ALKPHOS 159 (H) 11/09/2021 1006   BILITOT 0.9 11/09/2021 1006   GFR 4.87 (LL) 10/29/2021 0846   GFRNONAA 4 (L) 11/10/2021 0454     No results found.     Assessment & Plan:   1. ESRD (end stage renal disease) (HCC) Recommend:  The patient is doing well and currently has adequate dialysis access. The patient's dialysis center is not reporting any access issues. Flow pattern is stable when compared to the prior ultrasound.  The patient should have a duplex ultrasound of the dialysis access in 6 months. The patient will follow-up with me in the office after each ultrasound    2. Mixed hyperlipidemia Continue statin as ordered and  reviewed, no changes at this time  3. Primary hypertension Continue antihypertensive medications as already ordered, these medications have been reviewed and there are no changes at this time.   Current Outpatient Medications on File Prior to Visit  Medication Sig Dispense Refill   acetaminophen (TYLENOL) 500 MG tablet Take 1,000 mg by mouth every 6 (six) hours as needed for moderate pain or headache.     atorvastatin (LIPITOR) 40 MG tablet TAKE 1 TABLET BY MOUTH EVERY DAY 90 tablet 3   cyclobenzaprine (FLEXERIL) 5 MG tablet Take 5 mg by mouth 2 (two) times daily as needed.     gabapentin (NEURONTIN) 100 MG capsule Take 100-200 mg by mouth at bedtime.     midodrine (PROAMATINE) 10 MG tablet Take 5 mg by mouth See admin instructions. Take 1 tablet (5mg ) by mouth 30 minutes before dialysis and take 1 tablet (5mg ) by mouth during dialysis if needed for BP support     multivitamin (RENA-VIT) TABS tablet Take 1 tablet by mouth daily.     sucroferric oxyhydroxide (VELPHORO) 500 MG chewable tablet Chew 500-1,000 mg by mouth See admin instructions. Take 2 tablets (1000mg ) by mouth three times daily with meals and take 1 tablet (500mg ) by mouth with snacks     VITAMIN D PO Take by mouth.     calcitRIOL (ROCALTROL) 0.25 MCG capsule Take 1 capsule (0.25 mcg total) by mouth every Tuesday, Thursday, and Saturday at 6 PM. (Patient not taking: Reported on 09/14/2022) 15 capsule 0   calcium carbonate (OS-CAL - DOSED IN MG OF ELEMENTAL CALCIUM) 1250 (500 Ca) MG tablet Take 1 tablet (1,250 mg  total) by mouth daily with breakfast. (Patient not taking: Reported on 04/27/2022) 30 tablet 0   denosumab (PROLIA) 60 MG/ML SOSY injection INJECT 1 SYRINGE UNDER THE SKIN ONCE EVERY 6 MONTHS (Patient not taking: Reported on 04/27/2022) 1 mL 0   promethazine (PHENERGAN) 25 MG tablet Take 25 mg by mouth 2 (two) times daily as needed for nausea or vomiting. (Patient not taking: Reported on 04/27/2022)     No current  facility-administered medications on file prior to visit.    There are no Patient Instructions on file for this visit. No follow-ups on file.   Georgiana Spinner, NP

## 2022-09-24 DIAGNOSIS — D631 Anemia in chronic kidney disease: Secondary | ICD-10-CM | POA: Diagnosis not present

## 2022-09-24 DIAGNOSIS — N186 End stage renal disease: Secondary | ICD-10-CM | POA: Diagnosis not present

## 2022-09-24 DIAGNOSIS — N2581 Secondary hyperparathyroidism of renal origin: Secondary | ICD-10-CM | POA: Diagnosis not present

## 2022-09-24 DIAGNOSIS — Z992 Dependence on renal dialysis: Secondary | ICD-10-CM | POA: Diagnosis not present

## 2022-09-24 DIAGNOSIS — D689 Coagulation defect, unspecified: Secondary | ICD-10-CM | POA: Diagnosis not present

## 2022-09-26 DIAGNOSIS — D689 Coagulation defect, unspecified: Secondary | ICD-10-CM | POA: Diagnosis not present

## 2022-09-26 DIAGNOSIS — D631 Anemia in chronic kidney disease: Secondary | ICD-10-CM | POA: Diagnosis not present

## 2022-09-26 DIAGNOSIS — N186 End stage renal disease: Secondary | ICD-10-CM | POA: Diagnosis not present

## 2022-09-26 DIAGNOSIS — N2581 Secondary hyperparathyroidism of renal origin: Secondary | ICD-10-CM | POA: Diagnosis not present

## 2022-09-26 DIAGNOSIS — Z992 Dependence on renal dialysis: Secondary | ICD-10-CM | POA: Diagnosis not present

## 2022-09-29 ENCOUNTER — Telehealth: Payer: Self-pay | Admitting: *Deleted

## 2022-09-29 DIAGNOSIS — D689 Coagulation defect, unspecified: Secondary | ICD-10-CM | POA: Diagnosis not present

## 2022-09-29 DIAGNOSIS — Z992 Dependence on renal dialysis: Secondary | ICD-10-CM | POA: Diagnosis not present

## 2022-09-29 DIAGNOSIS — N186 End stage renal disease: Secondary | ICD-10-CM | POA: Diagnosis not present

## 2022-09-29 DIAGNOSIS — N2581 Secondary hyperparathyroidism of renal origin: Secondary | ICD-10-CM | POA: Diagnosis not present

## 2022-09-29 DIAGNOSIS — D631 Anemia in chronic kidney disease: Secondary | ICD-10-CM | POA: Diagnosis not present

## 2022-09-29 NOTE — Telephone Encounter (Signed)
CALLED PATIENT TO INFORM OF CT FOR 03-17-23 - ARRIVAL TIME- 10:45 AM @ [facility],NO RESTRICTIONS TO TEST, PATIENT TO RECEIVE RESULTS FROM DR. KINARD ON 03-22-23 @ 11 AM, SPOKE WITH PATIENT AND SHE IS AWARE OF THESE APPTS. AND THE INSTRUCTIONS

## 2022-10-01 DIAGNOSIS — D689 Coagulation defect, unspecified: Secondary | ICD-10-CM | POA: Diagnosis not present

## 2022-10-01 DIAGNOSIS — N2581 Secondary hyperparathyroidism of renal origin: Secondary | ICD-10-CM | POA: Diagnosis not present

## 2022-10-01 DIAGNOSIS — D631 Anemia in chronic kidney disease: Secondary | ICD-10-CM | POA: Diagnosis not present

## 2022-10-01 DIAGNOSIS — N186 End stage renal disease: Secondary | ICD-10-CM | POA: Diagnosis not present

## 2022-10-01 DIAGNOSIS — Z992 Dependence on renal dialysis: Secondary | ICD-10-CM | POA: Diagnosis not present

## 2022-10-03 DIAGNOSIS — Z992 Dependence on renal dialysis: Secondary | ICD-10-CM | POA: Diagnosis not present

## 2022-10-03 DIAGNOSIS — I129 Hypertensive chronic kidney disease with stage 1 through stage 4 chronic kidney disease, or unspecified chronic kidney disease: Secondary | ICD-10-CM | POA: Diagnosis not present

## 2022-10-03 DIAGNOSIS — N2581 Secondary hyperparathyroidism of renal origin: Secondary | ICD-10-CM | POA: Diagnosis not present

## 2022-10-03 DIAGNOSIS — D689 Coagulation defect, unspecified: Secondary | ICD-10-CM | POA: Diagnosis not present

## 2022-10-03 DIAGNOSIS — D631 Anemia in chronic kidney disease: Secondary | ICD-10-CM | POA: Diagnosis not present

## 2022-10-03 DIAGNOSIS — N186 End stage renal disease: Secondary | ICD-10-CM | POA: Diagnosis not present

## 2022-10-06 DIAGNOSIS — Z992 Dependence on renal dialysis: Secondary | ICD-10-CM | POA: Diagnosis not present

## 2022-10-06 DIAGNOSIS — D689 Coagulation defect, unspecified: Secondary | ICD-10-CM | POA: Diagnosis not present

## 2022-10-06 DIAGNOSIS — N186 End stage renal disease: Secondary | ICD-10-CM | POA: Diagnosis not present

## 2022-10-06 DIAGNOSIS — N2581 Secondary hyperparathyroidism of renal origin: Secondary | ICD-10-CM | POA: Diagnosis not present

## 2022-10-08 DIAGNOSIS — N186 End stage renal disease: Secondary | ICD-10-CM | POA: Diagnosis not present

## 2022-10-08 DIAGNOSIS — N2581 Secondary hyperparathyroidism of renal origin: Secondary | ICD-10-CM | POA: Diagnosis not present

## 2022-10-08 DIAGNOSIS — Z992 Dependence on renal dialysis: Secondary | ICD-10-CM | POA: Diagnosis not present

## 2022-10-08 DIAGNOSIS — D689 Coagulation defect, unspecified: Secondary | ICD-10-CM | POA: Diagnosis not present

## 2022-10-10 DIAGNOSIS — N186 End stage renal disease: Secondary | ICD-10-CM | POA: Diagnosis not present

## 2022-10-10 DIAGNOSIS — Z992 Dependence on renal dialysis: Secondary | ICD-10-CM | POA: Diagnosis not present

## 2022-10-10 DIAGNOSIS — D689 Coagulation defect, unspecified: Secondary | ICD-10-CM | POA: Diagnosis not present

## 2022-10-10 DIAGNOSIS — N2581 Secondary hyperparathyroidism of renal origin: Secondary | ICD-10-CM | POA: Diagnosis not present

## 2022-10-13 DIAGNOSIS — D689 Coagulation defect, unspecified: Secondary | ICD-10-CM | POA: Diagnosis not present

## 2022-10-13 DIAGNOSIS — N2581 Secondary hyperparathyroidism of renal origin: Secondary | ICD-10-CM | POA: Diagnosis not present

## 2022-10-13 DIAGNOSIS — N186 End stage renal disease: Secondary | ICD-10-CM | POA: Diagnosis not present

## 2022-10-13 DIAGNOSIS — Z992 Dependence on renal dialysis: Secondary | ICD-10-CM | POA: Diagnosis not present

## 2022-10-15 DIAGNOSIS — N186 End stage renal disease: Secondary | ICD-10-CM | POA: Diagnosis not present

## 2022-10-15 DIAGNOSIS — Z992 Dependence on renal dialysis: Secondary | ICD-10-CM | POA: Diagnosis not present

## 2022-10-15 DIAGNOSIS — N2581 Secondary hyperparathyroidism of renal origin: Secondary | ICD-10-CM | POA: Diagnosis not present

## 2022-10-15 DIAGNOSIS — D689 Coagulation defect, unspecified: Secondary | ICD-10-CM | POA: Diagnosis not present

## 2022-10-17 DIAGNOSIS — N2581 Secondary hyperparathyroidism of renal origin: Secondary | ICD-10-CM | POA: Diagnosis not present

## 2022-10-17 DIAGNOSIS — N186 End stage renal disease: Secondary | ICD-10-CM | POA: Diagnosis not present

## 2022-10-17 DIAGNOSIS — D689 Coagulation defect, unspecified: Secondary | ICD-10-CM | POA: Diagnosis not present

## 2022-10-17 DIAGNOSIS — Z992 Dependence on renal dialysis: Secondary | ICD-10-CM | POA: Diagnosis not present

## 2022-10-20 DIAGNOSIS — D689 Coagulation defect, unspecified: Secondary | ICD-10-CM | POA: Diagnosis not present

## 2022-10-20 DIAGNOSIS — N2581 Secondary hyperparathyroidism of renal origin: Secondary | ICD-10-CM | POA: Diagnosis not present

## 2022-10-20 DIAGNOSIS — Z992 Dependence on renal dialysis: Secondary | ICD-10-CM | POA: Diagnosis not present

## 2022-10-20 DIAGNOSIS — N186 End stage renal disease: Secondary | ICD-10-CM | POA: Diagnosis not present

## 2022-10-22 DIAGNOSIS — N186 End stage renal disease: Secondary | ICD-10-CM | POA: Diagnosis not present

## 2022-10-22 DIAGNOSIS — D689 Coagulation defect, unspecified: Secondary | ICD-10-CM | POA: Diagnosis not present

## 2022-10-22 DIAGNOSIS — N2581 Secondary hyperparathyroidism of renal origin: Secondary | ICD-10-CM | POA: Diagnosis not present

## 2022-10-22 DIAGNOSIS — Z992 Dependence on renal dialysis: Secondary | ICD-10-CM | POA: Diagnosis not present

## 2022-10-24 DIAGNOSIS — Z992 Dependence on renal dialysis: Secondary | ICD-10-CM | POA: Diagnosis not present

## 2022-10-24 DIAGNOSIS — N186 End stage renal disease: Secondary | ICD-10-CM | POA: Diagnosis not present

## 2022-10-24 DIAGNOSIS — N2581 Secondary hyperparathyroidism of renal origin: Secondary | ICD-10-CM | POA: Diagnosis not present

## 2022-10-24 DIAGNOSIS — D689 Coagulation defect, unspecified: Secondary | ICD-10-CM | POA: Diagnosis not present

## 2022-10-27 DIAGNOSIS — N186 End stage renal disease: Secondary | ICD-10-CM | POA: Diagnosis not present

## 2022-10-27 DIAGNOSIS — N2581 Secondary hyperparathyroidism of renal origin: Secondary | ICD-10-CM | POA: Diagnosis not present

## 2022-10-27 DIAGNOSIS — Z992 Dependence on renal dialysis: Secondary | ICD-10-CM | POA: Diagnosis not present

## 2022-10-27 DIAGNOSIS — D689 Coagulation defect, unspecified: Secondary | ICD-10-CM | POA: Diagnosis not present

## 2022-10-29 DIAGNOSIS — N2581 Secondary hyperparathyroidism of renal origin: Secondary | ICD-10-CM | POA: Diagnosis not present

## 2022-10-29 DIAGNOSIS — D689 Coagulation defect, unspecified: Secondary | ICD-10-CM | POA: Diagnosis not present

## 2022-10-29 DIAGNOSIS — Z992 Dependence on renal dialysis: Secondary | ICD-10-CM | POA: Diagnosis not present

## 2022-10-29 DIAGNOSIS — N186 End stage renal disease: Secondary | ICD-10-CM | POA: Diagnosis not present

## 2022-10-31 DIAGNOSIS — N2581 Secondary hyperparathyroidism of renal origin: Secondary | ICD-10-CM | POA: Diagnosis not present

## 2022-10-31 DIAGNOSIS — D689 Coagulation defect, unspecified: Secondary | ICD-10-CM | POA: Diagnosis not present

## 2022-10-31 DIAGNOSIS — Z992 Dependence on renal dialysis: Secondary | ICD-10-CM | POA: Diagnosis not present

## 2022-10-31 DIAGNOSIS — N186 End stage renal disease: Secondary | ICD-10-CM | POA: Diagnosis not present

## 2022-11-02 DIAGNOSIS — Z992 Dependence on renal dialysis: Secondary | ICD-10-CM | POA: Diagnosis not present

## 2022-11-02 DIAGNOSIS — N186 End stage renal disease: Secondary | ICD-10-CM | POA: Diagnosis not present

## 2022-11-02 DIAGNOSIS — I129 Hypertensive chronic kidney disease with stage 1 through stage 4 chronic kidney disease, or unspecified chronic kidney disease: Secondary | ICD-10-CM | POA: Diagnosis not present

## 2022-11-03 DIAGNOSIS — N186 End stage renal disease: Secondary | ICD-10-CM | POA: Diagnosis not present

## 2022-11-03 DIAGNOSIS — D689 Coagulation defect, unspecified: Secondary | ICD-10-CM | POA: Diagnosis not present

## 2022-11-03 DIAGNOSIS — N2581 Secondary hyperparathyroidism of renal origin: Secondary | ICD-10-CM | POA: Diagnosis not present

## 2022-11-03 DIAGNOSIS — Z23 Encounter for immunization: Secondary | ICD-10-CM | POA: Diagnosis not present

## 2022-11-03 DIAGNOSIS — D509 Iron deficiency anemia, unspecified: Secondary | ICD-10-CM | POA: Diagnosis not present

## 2022-11-03 DIAGNOSIS — D631 Anemia in chronic kidney disease: Secondary | ICD-10-CM | POA: Diagnosis not present

## 2022-11-03 DIAGNOSIS — Z992 Dependence on renal dialysis: Secondary | ICD-10-CM | POA: Diagnosis not present

## 2022-11-05 DIAGNOSIS — D509 Iron deficiency anemia, unspecified: Secondary | ICD-10-CM | POA: Diagnosis not present

## 2022-11-05 DIAGNOSIS — N186 End stage renal disease: Secondary | ICD-10-CM | POA: Diagnosis not present

## 2022-11-05 DIAGNOSIS — N2581 Secondary hyperparathyroidism of renal origin: Secondary | ICD-10-CM | POA: Diagnosis not present

## 2022-11-05 DIAGNOSIS — Z23 Encounter for immunization: Secondary | ICD-10-CM | POA: Diagnosis not present

## 2022-11-05 DIAGNOSIS — D689 Coagulation defect, unspecified: Secondary | ICD-10-CM | POA: Diagnosis not present

## 2022-11-05 DIAGNOSIS — D631 Anemia in chronic kidney disease: Secondary | ICD-10-CM | POA: Diagnosis not present

## 2022-11-05 DIAGNOSIS — Z992 Dependence on renal dialysis: Secondary | ICD-10-CM | POA: Diagnosis not present

## 2022-11-07 DIAGNOSIS — D689 Coagulation defect, unspecified: Secondary | ICD-10-CM | POA: Diagnosis not present

## 2022-11-07 DIAGNOSIS — D631 Anemia in chronic kidney disease: Secondary | ICD-10-CM | POA: Diagnosis not present

## 2022-11-07 DIAGNOSIS — D509 Iron deficiency anemia, unspecified: Secondary | ICD-10-CM | POA: Diagnosis not present

## 2022-11-07 DIAGNOSIS — Z23 Encounter for immunization: Secondary | ICD-10-CM | POA: Diagnosis not present

## 2022-11-07 DIAGNOSIS — N186 End stage renal disease: Secondary | ICD-10-CM | POA: Diagnosis not present

## 2022-11-07 DIAGNOSIS — N2581 Secondary hyperparathyroidism of renal origin: Secondary | ICD-10-CM | POA: Diagnosis not present

## 2022-11-07 DIAGNOSIS — Z992 Dependence on renal dialysis: Secondary | ICD-10-CM | POA: Diagnosis not present

## 2022-11-10 DIAGNOSIS — N2581 Secondary hyperparathyroidism of renal origin: Secondary | ICD-10-CM | POA: Diagnosis not present

## 2022-11-10 DIAGNOSIS — D509 Iron deficiency anemia, unspecified: Secondary | ICD-10-CM | POA: Diagnosis not present

## 2022-11-10 DIAGNOSIS — Z23 Encounter for immunization: Secondary | ICD-10-CM | POA: Diagnosis not present

## 2022-11-10 DIAGNOSIS — D689 Coagulation defect, unspecified: Secondary | ICD-10-CM | POA: Diagnosis not present

## 2022-11-10 DIAGNOSIS — N186 End stage renal disease: Secondary | ICD-10-CM | POA: Diagnosis not present

## 2022-11-10 DIAGNOSIS — D631 Anemia in chronic kidney disease: Secondary | ICD-10-CM | POA: Diagnosis not present

## 2022-11-10 DIAGNOSIS — Z992 Dependence on renal dialysis: Secondary | ICD-10-CM | POA: Diagnosis not present

## 2022-11-12 DIAGNOSIS — D689 Coagulation defect, unspecified: Secondary | ICD-10-CM | POA: Diagnosis not present

## 2022-11-12 DIAGNOSIS — N2581 Secondary hyperparathyroidism of renal origin: Secondary | ICD-10-CM | POA: Diagnosis not present

## 2022-11-12 DIAGNOSIS — Z23 Encounter for immunization: Secondary | ICD-10-CM | POA: Diagnosis not present

## 2022-11-12 DIAGNOSIS — Z992 Dependence on renal dialysis: Secondary | ICD-10-CM | POA: Diagnosis not present

## 2022-11-12 DIAGNOSIS — D509 Iron deficiency anemia, unspecified: Secondary | ICD-10-CM | POA: Diagnosis not present

## 2022-11-12 DIAGNOSIS — N186 End stage renal disease: Secondary | ICD-10-CM | POA: Diagnosis not present

## 2022-11-12 DIAGNOSIS — D631 Anemia in chronic kidney disease: Secondary | ICD-10-CM | POA: Diagnosis not present

## 2022-11-13 DIAGNOSIS — N186 End stage renal disease: Secondary | ICD-10-CM | POA: Diagnosis not present

## 2022-11-13 DIAGNOSIS — D689 Coagulation defect, unspecified: Secondary | ICD-10-CM | POA: Diagnosis not present

## 2022-11-13 DIAGNOSIS — N2581 Secondary hyperparathyroidism of renal origin: Secondary | ICD-10-CM | POA: Diagnosis not present

## 2022-11-13 DIAGNOSIS — Z23 Encounter for immunization: Secondary | ICD-10-CM | POA: Diagnosis not present

## 2022-11-13 DIAGNOSIS — D509 Iron deficiency anemia, unspecified: Secondary | ICD-10-CM | POA: Diagnosis not present

## 2022-11-13 DIAGNOSIS — D631 Anemia in chronic kidney disease: Secondary | ICD-10-CM | POA: Diagnosis not present

## 2022-11-13 DIAGNOSIS — Z992 Dependence on renal dialysis: Secondary | ICD-10-CM | POA: Diagnosis not present

## 2022-11-17 DIAGNOSIS — Z992 Dependence on renal dialysis: Secondary | ICD-10-CM | POA: Diagnosis not present

## 2022-11-17 DIAGNOSIS — D689 Coagulation defect, unspecified: Secondary | ICD-10-CM | POA: Diagnosis not present

## 2022-11-17 DIAGNOSIS — N186 End stage renal disease: Secondary | ICD-10-CM | POA: Diagnosis not present

## 2022-11-17 DIAGNOSIS — D631 Anemia in chronic kidney disease: Secondary | ICD-10-CM | POA: Diagnosis not present

## 2022-11-17 DIAGNOSIS — Z23 Encounter for immunization: Secondary | ICD-10-CM | POA: Diagnosis not present

## 2022-11-17 DIAGNOSIS — N2581 Secondary hyperparathyroidism of renal origin: Secondary | ICD-10-CM | POA: Diagnosis not present

## 2022-11-17 DIAGNOSIS — D509 Iron deficiency anemia, unspecified: Secondary | ICD-10-CM | POA: Diagnosis not present

## 2022-11-19 DIAGNOSIS — Z23 Encounter for immunization: Secondary | ICD-10-CM | POA: Diagnosis not present

## 2022-11-19 DIAGNOSIS — D689 Coagulation defect, unspecified: Secondary | ICD-10-CM | POA: Diagnosis not present

## 2022-11-19 DIAGNOSIS — Z992 Dependence on renal dialysis: Secondary | ICD-10-CM | POA: Diagnosis not present

## 2022-11-19 DIAGNOSIS — N2581 Secondary hyperparathyroidism of renal origin: Secondary | ICD-10-CM | POA: Diagnosis not present

## 2022-11-19 DIAGNOSIS — D631 Anemia in chronic kidney disease: Secondary | ICD-10-CM | POA: Diagnosis not present

## 2022-11-19 DIAGNOSIS — N186 End stage renal disease: Secondary | ICD-10-CM | POA: Diagnosis not present

## 2022-11-19 DIAGNOSIS — D509 Iron deficiency anemia, unspecified: Secondary | ICD-10-CM | POA: Diagnosis not present

## 2022-11-21 DIAGNOSIS — D509 Iron deficiency anemia, unspecified: Secondary | ICD-10-CM | POA: Diagnosis not present

## 2022-11-21 DIAGNOSIS — Z23 Encounter for immunization: Secondary | ICD-10-CM | POA: Diagnosis not present

## 2022-11-21 DIAGNOSIS — N2581 Secondary hyperparathyroidism of renal origin: Secondary | ICD-10-CM | POA: Diagnosis not present

## 2022-11-21 DIAGNOSIS — D631 Anemia in chronic kidney disease: Secondary | ICD-10-CM | POA: Diagnosis not present

## 2022-11-21 DIAGNOSIS — N186 End stage renal disease: Secondary | ICD-10-CM | POA: Diagnosis not present

## 2022-11-21 DIAGNOSIS — Z992 Dependence on renal dialysis: Secondary | ICD-10-CM | POA: Diagnosis not present

## 2022-11-21 DIAGNOSIS — D689 Coagulation defect, unspecified: Secondary | ICD-10-CM | POA: Diagnosis not present

## 2022-11-24 DIAGNOSIS — Z23 Encounter for immunization: Secondary | ICD-10-CM | POA: Diagnosis not present

## 2022-11-24 DIAGNOSIS — D689 Coagulation defect, unspecified: Secondary | ICD-10-CM | POA: Diagnosis not present

## 2022-11-24 DIAGNOSIS — N2581 Secondary hyperparathyroidism of renal origin: Secondary | ICD-10-CM | POA: Diagnosis not present

## 2022-11-24 DIAGNOSIS — D631 Anemia in chronic kidney disease: Secondary | ICD-10-CM | POA: Diagnosis not present

## 2022-11-24 DIAGNOSIS — N186 End stage renal disease: Secondary | ICD-10-CM | POA: Diagnosis not present

## 2022-11-24 DIAGNOSIS — Z992 Dependence on renal dialysis: Secondary | ICD-10-CM | POA: Diagnosis not present

## 2022-11-24 DIAGNOSIS — D509 Iron deficiency anemia, unspecified: Secondary | ICD-10-CM | POA: Diagnosis not present

## 2022-11-26 DIAGNOSIS — Z23 Encounter for immunization: Secondary | ICD-10-CM | POA: Diagnosis not present

## 2022-11-26 DIAGNOSIS — D689 Coagulation defect, unspecified: Secondary | ICD-10-CM | POA: Diagnosis not present

## 2022-11-26 DIAGNOSIS — D509 Iron deficiency anemia, unspecified: Secondary | ICD-10-CM | POA: Diagnosis not present

## 2022-11-26 DIAGNOSIS — D631 Anemia in chronic kidney disease: Secondary | ICD-10-CM | POA: Diagnosis not present

## 2022-11-26 DIAGNOSIS — N2581 Secondary hyperparathyroidism of renal origin: Secondary | ICD-10-CM | POA: Diagnosis not present

## 2022-11-26 DIAGNOSIS — Z992 Dependence on renal dialysis: Secondary | ICD-10-CM | POA: Diagnosis not present

## 2022-11-26 DIAGNOSIS — N186 End stage renal disease: Secondary | ICD-10-CM | POA: Diagnosis not present

## 2022-11-28 DIAGNOSIS — D631 Anemia in chronic kidney disease: Secondary | ICD-10-CM | POA: Diagnosis not present

## 2022-11-28 DIAGNOSIS — D509 Iron deficiency anemia, unspecified: Secondary | ICD-10-CM | POA: Diagnosis not present

## 2022-11-28 DIAGNOSIS — Z992 Dependence on renal dialysis: Secondary | ICD-10-CM | POA: Diagnosis not present

## 2022-11-28 DIAGNOSIS — N186 End stage renal disease: Secondary | ICD-10-CM | POA: Diagnosis not present

## 2022-11-28 DIAGNOSIS — Z23 Encounter for immunization: Secondary | ICD-10-CM | POA: Diagnosis not present

## 2022-11-28 DIAGNOSIS — N2581 Secondary hyperparathyroidism of renal origin: Secondary | ICD-10-CM | POA: Diagnosis not present

## 2022-11-28 DIAGNOSIS — D689 Coagulation defect, unspecified: Secondary | ICD-10-CM | POA: Diagnosis not present

## 2022-12-01 DIAGNOSIS — Z23 Encounter for immunization: Secondary | ICD-10-CM | POA: Diagnosis not present

## 2022-12-01 DIAGNOSIS — D689 Coagulation defect, unspecified: Secondary | ICD-10-CM | POA: Diagnosis not present

## 2022-12-01 DIAGNOSIS — Z992 Dependence on renal dialysis: Secondary | ICD-10-CM | POA: Diagnosis not present

## 2022-12-01 DIAGNOSIS — N186 End stage renal disease: Secondary | ICD-10-CM | POA: Diagnosis not present

## 2022-12-01 DIAGNOSIS — D631 Anemia in chronic kidney disease: Secondary | ICD-10-CM | POA: Diagnosis not present

## 2022-12-01 DIAGNOSIS — N2581 Secondary hyperparathyroidism of renal origin: Secondary | ICD-10-CM | POA: Diagnosis not present

## 2022-12-01 DIAGNOSIS — D509 Iron deficiency anemia, unspecified: Secondary | ICD-10-CM | POA: Diagnosis not present

## 2022-12-03 DIAGNOSIS — Z992 Dependence on renal dialysis: Secondary | ICD-10-CM | POA: Diagnosis not present

## 2022-12-03 DIAGNOSIS — D509 Iron deficiency anemia, unspecified: Secondary | ICD-10-CM | POA: Diagnosis not present

## 2022-12-03 DIAGNOSIS — D631 Anemia in chronic kidney disease: Secondary | ICD-10-CM | POA: Diagnosis not present

## 2022-12-03 DIAGNOSIS — D689 Coagulation defect, unspecified: Secondary | ICD-10-CM | POA: Diagnosis not present

## 2022-12-03 DIAGNOSIS — I129 Hypertensive chronic kidney disease with stage 1 through stage 4 chronic kidney disease, or unspecified chronic kidney disease: Secondary | ICD-10-CM | POA: Diagnosis not present

## 2022-12-03 DIAGNOSIS — N186 End stage renal disease: Secondary | ICD-10-CM | POA: Diagnosis not present

## 2022-12-03 DIAGNOSIS — Z23 Encounter for immunization: Secondary | ICD-10-CM | POA: Diagnosis not present

## 2022-12-03 DIAGNOSIS — N2581 Secondary hyperparathyroidism of renal origin: Secondary | ICD-10-CM | POA: Diagnosis not present

## 2022-12-05 DIAGNOSIS — Z992 Dependence on renal dialysis: Secondary | ICD-10-CM | POA: Diagnosis not present

## 2022-12-05 DIAGNOSIS — N186 End stage renal disease: Secondary | ICD-10-CM | POA: Diagnosis not present

## 2022-12-05 DIAGNOSIS — D509 Iron deficiency anemia, unspecified: Secondary | ICD-10-CM | POA: Diagnosis not present

## 2022-12-05 DIAGNOSIS — N2581 Secondary hyperparathyroidism of renal origin: Secondary | ICD-10-CM | POA: Diagnosis not present

## 2022-12-05 DIAGNOSIS — D689 Coagulation defect, unspecified: Secondary | ICD-10-CM | POA: Diagnosis not present

## 2022-12-05 DIAGNOSIS — D631 Anemia in chronic kidney disease: Secondary | ICD-10-CM | POA: Diagnosis not present

## 2022-12-08 DIAGNOSIS — D631 Anemia in chronic kidney disease: Secondary | ICD-10-CM | POA: Diagnosis not present

## 2022-12-08 DIAGNOSIS — N2581 Secondary hyperparathyroidism of renal origin: Secondary | ICD-10-CM | POA: Diagnosis not present

## 2022-12-08 DIAGNOSIS — Z992 Dependence on renal dialysis: Secondary | ICD-10-CM | POA: Diagnosis not present

## 2022-12-08 DIAGNOSIS — D509 Iron deficiency anemia, unspecified: Secondary | ICD-10-CM | POA: Diagnosis not present

## 2022-12-08 DIAGNOSIS — D689 Coagulation defect, unspecified: Secondary | ICD-10-CM | POA: Diagnosis not present

## 2022-12-08 DIAGNOSIS — N186 End stage renal disease: Secondary | ICD-10-CM | POA: Diagnosis not present

## 2022-12-10 DIAGNOSIS — D509 Iron deficiency anemia, unspecified: Secondary | ICD-10-CM | POA: Diagnosis not present

## 2022-12-10 DIAGNOSIS — N2581 Secondary hyperparathyroidism of renal origin: Secondary | ICD-10-CM | POA: Diagnosis not present

## 2022-12-10 DIAGNOSIS — N186 End stage renal disease: Secondary | ICD-10-CM | POA: Diagnosis not present

## 2022-12-10 DIAGNOSIS — D689 Coagulation defect, unspecified: Secondary | ICD-10-CM | POA: Diagnosis not present

## 2022-12-10 DIAGNOSIS — Z992 Dependence on renal dialysis: Secondary | ICD-10-CM | POA: Diagnosis not present

## 2022-12-10 DIAGNOSIS — D631 Anemia in chronic kidney disease: Secondary | ICD-10-CM | POA: Diagnosis not present

## 2022-12-11 ENCOUNTER — Telehealth: Payer: Self-pay | Admitting: Family

## 2022-12-11 NOTE — Telephone Encounter (Signed)
FYI: This call has been transferred to Access Nurse. Once the result note has been entered staff can address the message at that time.  Patient called in with the following symptoms:  Red Word:dizziness  Patient called in and stated that she has been feeling dizzy and lightheaded, like she is going to pass out. She stated that she is on dialysis and not sure if that may cause it.   Please advise at [facility] [phone]  Message is routed to Provider Pool and [facility] Triage

## 2022-12-11 NOTE — Telephone Encounter (Signed)
Per appt notes pt has appt with Hayden Pedro FNP on 12/14/22 at 12:40 . Sending note to Hayden Pedro FNP and [facility] pool.

## 2022-12-12 DIAGNOSIS — D631 Anemia in chronic kidney disease: Secondary | ICD-10-CM | POA: Diagnosis not present

## 2022-12-12 DIAGNOSIS — D689 Coagulation defect, unspecified: Secondary | ICD-10-CM | POA: Diagnosis not present

## 2022-12-12 DIAGNOSIS — D509 Iron deficiency anemia, unspecified: Secondary | ICD-10-CM | POA: Diagnosis not present

## 2022-12-12 DIAGNOSIS — N186 End stage renal disease: Secondary | ICD-10-CM | POA: Diagnosis not present

## 2022-12-12 DIAGNOSIS — N2581 Secondary hyperparathyroidism of renal origin: Secondary | ICD-10-CM | POA: Diagnosis not present

## 2022-12-12 DIAGNOSIS — Z992 Dependence on renal dialysis: Secondary | ICD-10-CM | POA: Diagnosis not present

## 2022-12-14 ENCOUNTER — Telehealth: Payer: Self-pay

## 2022-12-14 ENCOUNTER — Other Ambulatory Visit: Payer: Self-pay | Admitting: Family

## 2022-12-14 ENCOUNTER — Ambulatory Visit (INDEPENDENT_AMBULATORY_CARE_PROVIDER_SITE_OTHER)
Admission: RE | Admit: 2022-12-14 | Discharge: 2022-12-14 | Disposition: A | Discharge status: Home / Self Care | Payer: 59 | Source: Ambulatory Visit | Attending: Family

## 2022-12-14 ENCOUNTER — Ambulatory Visit (INDEPENDENT_AMBULATORY_CARE_PROVIDER_SITE_OTHER): Payer: 59 | Admitting: Family

## 2022-12-14 VITALS — BP 134/80 | HR 83 | Temp 97.8°F | Ht 65.0 in | Wt 140.2 lb

## 2022-12-14 DIAGNOSIS — R748 Abnormal levels of other serum enzymes: Secondary | ICD-10-CM | POA: Diagnosis not present

## 2022-12-14 DIAGNOSIS — R0609 Other forms of dyspnea: Secondary | ICD-10-CM | POA: Diagnosis not present

## 2022-12-14 DIAGNOSIS — R42 Dizziness and giddiness: Secondary | ICD-10-CM | POA: Diagnosis not present

## 2022-12-14 DIAGNOSIS — R0602 Shortness of breath: Secondary | ICD-10-CM

## 2022-12-14 DIAGNOSIS — E782 Mixed hyperlipidemia: Secondary | ICD-10-CM

## 2022-12-14 DIAGNOSIS — R0989 Other specified symptoms and signs involving the circulatory and respiratory systems: Secondary | ICD-10-CM | POA: Insufficient documentation

## 2022-12-14 DIAGNOSIS — R5383 Other fatigue: Secondary | ICD-10-CM | POA: Diagnosis not present

## 2022-12-14 DIAGNOSIS — J984 Other disorders of lung: Secondary | ICD-10-CM | POA: Diagnosis not present

## 2022-12-14 DIAGNOSIS — I10 Essential (primary) hypertension: Secondary | ICD-10-CM

## 2022-12-14 DIAGNOSIS — R918 Other nonspecific abnormal finding of lung field: Secondary | ICD-10-CM | POA: Diagnosis not present

## 2022-12-14 DIAGNOSIS — F411 Generalized anxiety disorder: Secondary | ICD-10-CM

## 2022-12-14 LAB — CBC
HCT: 35.3 % — ABNORMAL LOW (ref 36.0–46.0)
Hemoglobin: 11.9 g/dL — ABNORMAL LOW (ref 12.0–15.0)
MCHC: 33.6 g/dL (ref 30.0–36.0)
MCV: 87.8 fL (ref 78.0–100.0)
Platelets: 287 10*3/uL (ref 150.0–400.0)
RBC: 4.03 Mil/uL (ref 3.87–5.11)
RDW: 16.2 % — ABNORMAL HIGH (ref 11.5–15.5)
WBC: 7.8 10*3/uL (ref 4.0–10.5)

## 2022-12-14 LAB — COMPREHENSIVE METABOLIC PANEL
ALT: 14 U/L (ref 0–35)
AST: 19 U/L (ref 0–37)
Albumin: 4.3 g/dL (ref 3.5–5.2)
Alkaline Phosphatase: 67 U/L (ref 39–117)
BUN: 63 mg/dL — ABNORMAL HIGH (ref 6–23)
CO2: 32 meq/L (ref 19–32)
Calcium: 10.5 mg/dL (ref 8.4–10.5)
Chloride: 92 meq/L — ABNORMAL LOW (ref 96–112)
Creatinine, Ser: 11.07 mg/dL (ref 0.40–1.20)
GFR: 3.16 mL/min — CL (ref >60.00)
Glucose, Bld: 100 mg/dL — ABNORMAL HIGH (ref 70–99)
Potassium: 4.2 meq/L (ref 3.5–5.1)
Sodium: 142 meq/L (ref 135–145)
Total Bilirubin: 0.5 mg/dL (ref 0.2–1.2)
Total Protein: 7.1 g/dL (ref 6.0–8.3)

## 2022-12-14 LAB — LIPID PANEL
Cholesterol: 232 mg/dL — ABNORMAL HIGH (ref 0–200)
HDL: 50.3 mg/dL (ref >39.00)
LDL Cholesterol: 158 mg/dL — ABNORMAL HIGH (ref 0–99)
NonHDL: 181.36
Total CHOL/HDL Ratio: 5
Triglycerides: 115 mg/dL (ref 0.0–149.0)
VLDL: 23 mg/dL (ref 0.0–40.0)

## 2022-12-14 LAB — IBC + FERRITIN
Ferritin: 363.1 ng/mL — ABNORMAL HIGH (ref 10.0–291.0)
Iron: 80 ug/dL (ref 42–145)
Saturation Ratios: 27.2 % (ref 20.0–50.0)
TIBC: 294 ug/dL (ref 250.0–450.0)
Transferrin: 210 mg/dL — ABNORMAL LOW (ref 212.0–360.0)

## 2022-12-14 LAB — TSH: TSH: 2.19 u[IU]/mL (ref 0.35–5.50)

## 2022-12-14 MED ORDER — BUSPIRONE HCL 5 MG PO TABS: ORAL_TABLET | ORAL | 2 refills | Status: DC

## 2022-12-14 MED ORDER — ATORVASTATIN CALCIUM 80 MG PO TABS: 80.0000 mg | ORAL_TABLET | Freq: Every day | ORAL | 3 refills | Status: DC

## 2022-12-14 NOTE — Progress Notes (Signed)
Kidney function expected with renal disease, on dialysis.   Cholesterol very elevated. Will increase atorvastatin to 80 mg once daily.  Anemia present but slightly improved, hgn was in 10's per dialysis note. Iron on the lower end, ferritin a bit elevated but likely from recent iron intake, might be inflammatory.

## 2022-12-14 NOTE — Progress Notes (Unsigned)
Established Patient Office Visit  Subjective:   Patient ID: Kimberly Frost, female    DOB: 07-28-1950  Age: 72 y.o. MRN: [id number]  CC:  Chief Complaint  Patient presents with   Dizziness    HPI: Kimberly Frost is a 72 y.o. female presenting on 12/14/2022 for Dizziness  Here with c/o dizziness, started about one week ago. She has increased stress and feeling overwhelmed and states at times with panic attacks, she would like to consider some medication as lately with increased agitation and panic attacks, hard for her to turn her mind off at night because she starts thinking about so many things and hard for her to breath so she has to sit up. Has missed a few dialysis treatments because of transportation issues. She goes three days a week and has missed two sessions in the last few weeks. She describes the dizziness as feeling 'wobbly' and nerves are all over the place. She does report sob and hard to catch her breath, for about one week as well. DOE easily. Denies pedal edema. Denies chest pain.   Called in 11/8 with c/o dizziness and light headedness. With dialysis her iron and hgb with decrease, she states was given iron to help with this as well.   CT chest stable 5 mm right lower lobe pulmonary nodule, aortic atherosclerosis and emphysema. Decreased nodules in the right apex.       ROS: Negative unless specifically indicated above in HPI.   Relevant past medical history reviewed and updated as indicated.   Allergies and medications reviewed and updated.   Current Outpatient Medications:    acetaminophen (TYLENOL) 500 MG tablet, Take 1,000 mg by mouth every 6 (six) hours as needed for moderate pain or headache., Disp: , Rfl:    busPIRone (BUSPAR) 5 MG tablet, Take 1/2 tablet twice daily, Disp: 30 tablet, Rfl: 2   cyclobenzaprine (FLEXERIL) 5 MG tablet, Take 5 mg by mouth 2 (two) times daily as needed., Disp: , Rfl:    gabapentin (NEURONTIN) 100 MG capsule, Take 100-200 mg  by mouth at bedtime., Disp: , Rfl:    midodrine (PROAMATINE) 10 MG tablet, Take 5 mg by mouth See admin instructions. Take 1 tablet (5mg ) by mouth 30 minutes before dialysis and take 1 tablet (5mg ) by mouth during dialysis if needed for BP support, Disp: , Rfl:    multivitamin (RENA-VIT) TABS tablet, Take 1 tablet by mouth daily., Disp: , Rfl:    sucroferric oxyhydroxide (VELPHORO) 500 MG chewable tablet, Chew 500-1,000 mg by mouth See admin instructions. Take 2 tablets (1000mg ) by mouth three times daily with meals and take 1 tablet (500mg ) by mouth with snacks, Disp: , Rfl:    VITAMIN D PO, Take by mouth., Disp: , Rfl:    atorvastatin (LIPITOR) 80 MG tablet, Take 1 tablet (80 mg total) by mouth daily., Disp: 90 tablet, Rfl: 3  No Known Allergies  Objective:   BP 134/80 (BP Location: Right Arm, Patient Position: Sitting, Cuff Size: Normal)   Pulse 83   Temp 97.8 F (36.6 C) (Temporal)   Ht 5\' 5"  (1.651 m)   Wt 140 lb 3.2 oz (63.6 kg)   SpO2 98%   BMI 23.33 kg/m    Physical Exam Constitutional:      General: She is not in acute distress.    Appearance: Normal appearance. She is normal weight. She is not ill-appearing, toxic-appearing or diaphoretic.  HENT:     Head: Normocephalic.  Right Ear: Tympanic membrane normal.     Left Ear: Tympanic membrane normal.     Nose: Nose normal.     Mouth/Throat:     Mouth: Mucous membranes are dry.     Pharynx: No oropharyngeal exudate or posterior oropharyngeal erythema.  Eyes:     Extraocular Movements: Extraocular movements intact.     Pupils: Pupils are equal, round, and reactive to light.  Cardiovascular:     Rate and Rhythm: Normal rate and regular rhythm.     Pulses: Normal pulses.     Heart sounds: Normal heart sounds.  Pulmonary:     Effort: Pulmonary effort is normal.     Breath sounds: Examination of the right-lower field reveals rales. Rales present.  Musculoskeletal:     Cervical back: Normal range of motion.     Right  lower leg: No edema.     Left lower leg: No edema.  Neurological:     General: No focal deficit present.     Mental Status: She is alert and oriented to person, place, and time. Mental status is at baseline.  Psychiatric:        Mood and Affect: Mood normal.        Behavior: Behavior normal.        Thought Content: Thought content normal.        Judgment: Judgment normal.     Assessment & Plan:  Other fatigue -     CBC -     TSH -     DG Chest 2 View; Future -     IBC + Ferritin  Primary hypertension -     Comprehensive metabolic panel  Mixed hyperlipidemia -     Lipid panel  SOB (shortness of breath) -     DG Chest 2 View; Future  DOE (dyspnea on exertion) Assessment & Plan: Worsening with chest tightness Chest xray today r/o pneumonia, viral process, mass, pericardial effusion although keeping in mind is currently with lung mass followed by oncology. Could also be anxiety related as pt states seems to worsen with panic attacks, and or anxiety provoking events.  Did advise if any sudden worsening breath or chest pain, call 911.  Orders: -     DG Chest 2 View; Future  Dizziness Assessment & Plan: Ongoing, worse in the last one week.  Ordering cbc and b12  R/o IDA, b12 def    Anxiety state Assessment & Plan: Rx buspirone.  Assessed kidney risk, no dosage adjustments other than consider lower dosage at start so starting buspirone 2.5 mg twice daily.  Instructed anxiety reducing techniques.  May consider valium if necessary but will hold for now.    Orders: -     busPIRone HCl; Take 1/2 tablet twice daily  Dispense: 30 tablet; Refill: 2  Respiratory crackles at right lung base Assessment & Plan: Ordering chest xray pending results.   Orders: -     DG Chest 2 View; Future  Elevated alkaline phosphatase level -     Comprehensive metabolic panel     Follow up plan: Return in about 3 months (around 03/16/2023).  Mort Sawyers, FNP

## 2022-12-14 NOTE — Progress Notes (Signed)
Pt does see oncology for lung mass correct? This is seen again in the right lung base, and is persistent and remains stable in size per radiology. If she does see oncology have her also reach out to them to let them know that she has noticed an increased difficulty to take in a deep breath and also more sob with walking. I am ruling out other lab causes, but right now not seeing fluid which is what I wanted to rule out and or infection which I do not see.

## 2022-12-14 NOTE — Telephone Encounter (Signed)
LB lab called critical lab with creatinine 11.07 and GFR is 3.15 results are in epic. Sending note to T Dugal FNP who is out of office and I spoke with tabitha so she would be aware of results. Lab results noted in lab notebook.

## 2022-12-14 NOTE — Telephone Encounter (Signed)
Saw patient already, thank you for speaking with the patient.  Please see note for further information.  

## 2022-12-14 NOTE — Telephone Encounter (Signed)
Noted.  Pt is on dialysis with ESRD

## 2022-12-14 NOTE — Assessment & Plan Note (Signed)
Ongoing, worse in the last one week.  Ordering cbc and b12  R/o IDA, b12 def

## 2022-12-14 NOTE — Patient Instructions (Addendum)
  Start buspirone 2.5 mg twice daily.

## 2022-12-15 ENCOUNTER — Telehealth: Payer: Self-pay | Admitting: Family

## 2022-12-15 DIAGNOSIS — N186 End stage renal disease: Secondary | ICD-10-CM | POA: Diagnosis not present

## 2022-12-15 DIAGNOSIS — Z992 Dependence on renal dialysis: Secondary | ICD-10-CM | POA: Diagnosis not present

## 2022-12-15 DIAGNOSIS — N2581 Secondary hyperparathyroidism of renal origin: Secondary | ICD-10-CM | POA: Diagnosis not present

## 2022-12-15 DIAGNOSIS — D509 Iron deficiency anemia, unspecified: Secondary | ICD-10-CM | POA: Diagnosis not present

## 2022-12-15 DIAGNOSIS — D631 Anemia in chronic kidney disease: Secondary | ICD-10-CM | POA: Diagnosis not present

## 2022-12-15 DIAGNOSIS — D689 Coagulation defect, unspecified: Secondary | ICD-10-CM | POA: Diagnosis not present

## 2022-12-15 NOTE — Assessment & Plan Note (Signed)
Rx buspirone.  Assessed kidney risk, no dosage adjustments other than consider lower dosage at start so starting buspirone 2.5 mg twice daily.  Instructed anxiety reducing techniques.  May consider valium if necessary but will hold for now.

## 2022-12-15 NOTE — Assessment & Plan Note (Addendum)
Worsening with chest tightness Chest xray today r/o pneumonia, viral process, mass, pericardial effusion although keeping in mind is currently with lung mass followed by oncology. Could also be anxiety related as pt states seems to worsen with panic attacks, and or anxiety provoking events.  Did advise if any sudden worsening breath or chest pain, call 911.

## 2022-12-15 NOTE — Telephone Encounter (Signed)
See result notes. 

## 2022-12-15 NOTE — Telephone Encounter (Signed)
Patient returned call regarding labs, requested a call back when able

## 2022-12-15 NOTE — Assessment & Plan Note (Signed)
Ordering chest xray pending results.

## 2022-12-16 ENCOUNTER — Ambulatory Visit: Payer: 59 | Admitting: Family

## 2022-12-17 DIAGNOSIS — Z992 Dependence on renal dialysis: Secondary | ICD-10-CM | POA: Diagnosis not present

## 2022-12-17 DIAGNOSIS — D509 Iron deficiency anemia, unspecified: Secondary | ICD-10-CM | POA: Diagnosis not present

## 2022-12-17 DIAGNOSIS — D631 Anemia in chronic kidney disease: Secondary | ICD-10-CM | POA: Diagnosis not present

## 2022-12-17 DIAGNOSIS — N2581 Secondary hyperparathyroidism of renal origin: Secondary | ICD-10-CM | POA: Diagnosis not present

## 2022-12-17 DIAGNOSIS — N186 End stage renal disease: Secondary | ICD-10-CM | POA: Diagnosis not present

## 2022-12-17 DIAGNOSIS — D689 Coagulation defect, unspecified: Secondary | ICD-10-CM | POA: Diagnosis not present

## 2022-12-18 ENCOUNTER — Telehealth: Payer: Self-pay

## 2022-12-18 NOTE — Telephone Encounter (Signed)
Patient called in to report increased shortness of breath, cough and lightheadedness. Patient also reports increased headaches.  Patient was told by PCP to follow up with Radiation oncologist due to recent chest xray. Patient would like to be seen before Feb. 2025. Patient completed 10 radiation treatments to right lung on 08/26/22. Pls advise

## 2022-12-19 DIAGNOSIS — D631 Anemia in chronic kidney disease: Secondary | ICD-10-CM | POA: Diagnosis not present

## 2022-12-19 DIAGNOSIS — D689 Coagulation defect, unspecified: Secondary | ICD-10-CM | POA: Diagnosis not present

## 2022-12-19 DIAGNOSIS — D509 Iron deficiency anemia, unspecified: Secondary | ICD-10-CM | POA: Diagnosis not present

## 2022-12-19 DIAGNOSIS — N2581 Secondary hyperparathyroidism of renal origin: Secondary | ICD-10-CM | POA: Diagnosis not present

## 2022-12-19 DIAGNOSIS — N186 End stage renal disease: Secondary | ICD-10-CM | POA: Diagnosis not present

## 2022-12-19 DIAGNOSIS — Z992 Dependence on renal dialysis: Secondary | ICD-10-CM | POA: Diagnosis not present

## 2022-12-21 ENCOUNTER — Telehealth: Payer: Self-pay

## 2022-12-21 NOTE — Telephone Encounter (Signed)
Ms. Kimberly Frost called in to report light headiness, weakness, pain when coughing and no appetite. She reports "feeling weird". She completed R lung treatment 08/26/2022. Please advise

## 2022-12-22 DIAGNOSIS — Z992 Dependence on renal dialysis: Secondary | ICD-10-CM | POA: Diagnosis not present

## 2022-12-22 DIAGNOSIS — D509 Iron deficiency anemia, unspecified: Secondary | ICD-10-CM | POA: Diagnosis not present

## 2022-12-22 DIAGNOSIS — N2581 Secondary hyperparathyroidism of renal origin: Secondary | ICD-10-CM | POA: Diagnosis not present

## 2022-12-22 DIAGNOSIS — D631 Anemia in chronic kidney disease: Secondary | ICD-10-CM | POA: Diagnosis not present

## 2022-12-22 DIAGNOSIS — D689 Coagulation defect, unspecified: Secondary | ICD-10-CM | POA: Diagnosis not present

## 2022-12-22 DIAGNOSIS — N186 End stage renal disease: Secondary | ICD-10-CM | POA: Diagnosis not present

## 2022-12-22 NOTE — Telephone Encounter (Signed)
I did a follow up call to Kimberly Frost to let her know that Dr. Roselind Messier is working on moving her CT scan up.

## 2022-12-23 ENCOUNTER — Telehealth: Payer: Self-pay | Admitting: *Deleted

## 2022-12-23 NOTE — Telephone Encounter (Signed)
CALLED PATIENT TO INFORM OF CT FOR 12-28-22- ARRIVAL TIME- 12:15 PM @ [facility], NO RESTRICTIONS TO SCAN, PATIENT TO RECEIVE CT RESULTS FROM DR. KINARD ON 01-04-23 @ 4:15 PM, SPOKE WITH PATIENT AND SHE IS AWARE OF THESE APPTS. AND THE INSTRUCTIONS

## 2022-12-24 DIAGNOSIS — Z992 Dependence on renal dialysis: Secondary | ICD-10-CM | POA: Diagnosis not present

## 2022-12-24 DIAGNOSIS — D509 Iron deficiency anemia, unspecified: Secondary | ICD-10-CM | POA: Diagnosis not present

## 2022-12-24 DIAGNOSIS — D689 Coagulation defect, unspecified: Secondary | ICD-10-CM | POA: Diagnosis not present

## 2022-12-24 DIAGNOSIS — N186 End stage renal disease: Secondary | ICD-10-CM | POA: Diagnosis not present

## 2022-12-24 DIAGNOSIS — N2581 Secondary hyperparathyroidism of renal origin: Secondary | ICD-10-CM | POA: Diagnosis not present

## 2022-12-24 DIAGNOSIS — D631 Anemia in chronic kidney disease: Secondary | ICD-10-CM | POA: Diagnosis not present

## 2022-12-26 DIAGNOSIS — Z992 Dependence on renal dialysis: Secondary | ICD-10-CM | POA: Diagnosis not present

## 2022-12-26 DIAGNOSIS — D689 Coagulation defect, unspecified: Secondary | ICD-10-CM | POA: Diagnosis not present

## 2022-12-26 DIAGNOSIS — D631 Anemia in chronic kidney disease: Secondary | ICD-10-CM | POA: Diagnosis not present

## 2022-12-26 DIAGNOSIS — N2581 Secondary hyperparathyroidism of renal origin: Secondary | ICD-10-CM | POA: Diagnosis not present

## 2022-12-26 DIAGNOSIS — N186 End stage renal disease: Secondary | ICD-10-CM | POA: Diagnosis not present

## 2022-12-26 DIAGNOSIS — D509 Iron deficiency anemia, unspecified: Secondary | ICD-10-CM | POA: Diagnosis not present

## 2022-12-28 ENCOUNTER — Encounter (HOSPITAL_COMMUNITY): Payer: Self-pay

## 2022-12-28 ENCOUNTER — Ambulatory Visit (HOSPITAL_COMMUNITY)
Admission: RE | Admit: 2022-12-28 | Discharge: 2022-12-28 | Disposition: A | Discharge status: Home / Self Care | Payer: 59 | Source: Ambulatory Visit | Attending: Radiation Oncology | Admitting: Radiation Oncology

## 2022-12-28 DIAGNOSIS — D509 Iron deficiency anemia, unspecified: Secondary | ICD-10-CM | POA: Diagnosis not present

## 2022-12-28 DIAGNOSIS — N186 End stage renal disease: Secondary | ICD-10-CM | POA: Diagnosis not present

## 2022-12-28 DIAGNOSIS — I700 Atherosclerosis of aorta: Secondary | ICD-10-CM | POA: Diagnosis not present

## 2022-12-28 DIAGNOSIS — C3491 Malignant neoplasm of unspecified part of right bronchus or lung: Secondary | ICD-10-CM | POA: Insufficient documentation

## 2022-12-28 DIAGNOSIS — C3411 Malignant neoplasm of upper lobe, right bronchus or lung: Secondary | ICD-10-CM | POA: Diagnosis not present

## 2022-12-28 DIAGNOSIS — Z992 Dependence on renal dialysis: Secondary | ICD-10-CM | POA: Diagnosis not present

## 2022-12-28 DIAGNOSIS — D631 Anemia in chronic kidney disease: Secondary | ICD-10-CM | POA: Diagnosis not present

## 2022-12-28 DIAGNOSIS — N2581 Secondary hyperparathyroidism of renal origin: Secondary | ICD-10-CM | POA: Diagnosis not present

## 2022-12-28 DIAGNOSIS — D689 Coagulation defect, unspecified: Secondary | ICD-10-CM | POA: Diagnosis not present

## 2022-12-28 DIAGNOSIS — J432 Centrilobular emphysema: Secondary | ICD-10-CM | POA: Diagnosis not present

## 2022-12-30 DIAGNOSIS — D631 Anemia in chronic kidney disease: Secondary | ICD-10-CM | POA: Diagnosis not present

## 2022-12-30 DIAGNOSIS — D689 Coagulation defect, unspecified: Secondary | ICD-10-CM | POA: Diagnosis not present

## 2022-12-30 DIAGNOSIS — N2581 Secondary hyperparathyroidism of renal origin: Secondary | ICD-10-CM | POA: Diagnosis not present

## 2022-12-30 DIAGNOSIS — Z992 Dependence on renal dialysis: Secondary | ICD-10-CM | POA: Diagnosis not present

## 2022-12-30 DIAGNOSIS — N186 End stage renal disease: Secondary | ICD-10-CM | POA: Diagnosis not present

## 2022-12-30 DIAGNOSIS — D509 Iron deficiency anemia, unspecified: Secondary | ICD-10-CM | POA: Diagnosis not present

## 2023-01-02 DIAGNOSIS — D631 Anemia in chronic kidney disease: Secondary | ICD-10-CM | POA: Diagnosis not present

## 2023-01-02 DIAGNOSIS — N2581 Secondary hyperparathyroidism of renal origin: Secondary | ICD-10-CM | POA: Diagnosis not present

## 2023-01-02 DIAGNOSIS — D689 Coagulation defect, unspecified: Secondary | ICD-10-CM | POA: Diagnosis not present

## 2023-01-02 DIAGNOSIS — N186 End stage renal disease: Secondary | ICD-10-CM | POA: Diagnosis not present

## 2023-01-02 DIAGNOSIS — D509 Iron deficiency anemia, unspecified: Secondary | ICD-10-CM | POA: Diagnosis not present

## 2023-01-02 DIAGNOSIS — Z992 Dependence on renal dialysis: Secondary | ICD-10-CM | POA: Diagnosis not present

## 2023-01-02 DIAGNOSIS — I129 Hypertensive chronic kidney disease with stage 1 through stage 4 chronic kidney disease, or unspecified chronic kidney disease: Secondary | ICD-10-CM | POA: Diagnosis not present

## 2023-01-03 NOTE — Progress Notes (Signed)
Radiation Oncology         [phone] ________________________________  Name: Kimberly Frost MRN: [id number]  Date: 01/04/2023  DOB: 1950/12/27  Follow-Up Visit Note  CC: Mort Sawyers, FNP  Salley Scarlet, MD  No diagnosis found.  Diagnosis: The primary encounter diagnosis was NSCLC of right lung (HCC). A diagnosis of NSCLC of lower lobe (HCC) was also pertinent to this visit.   Stage IB (cT2, N0, M0) NSCLC of the RUL  Interval Since Last Radiation: 4 months and 8 days   Intent: Curative  Radiation Treatment Dates: 08/12/2021 through 08/25/2021 Site Technique Total Dose (Gy) Dose per Fx (Gy) Completed Fx Beam Energies  Lung, Right: Lung_R IMRT 50/50 5 10/10 6XFFF    Narrative:  The patient returns today for routine follow-up and to review recent imaging. She was last seen here for follow-up on 09/14/22.  Since her last visit, the patient presented to her PCP on 12/14/22 with c/o SOB and exertional dyspnea. A chest x-ray was subsequently performed that day which showed the persistent opacity in the medial right lung apex with volume loss. Imaging otherwise showed no evidence of acute cardiopulmonary disease.      Her most recent chest CT without contrast on 12/28/22 demonstrates: ***  ***                              Allergies:  has No Known Allergies.  Meds: Current Outpatient Medications  Medication Sig Dispense Refill   acetaminophen (TYLENOL) 500 MG tablet Take 1,000 mg by mouth every 6 (six) hours as needed for moderate pain or headache.     atorvastatin (LIPITOR) 80 MG tablet Take 1 tablet (80 mg total) by mouth daily. 90 tablet 3   busPIRone (BUSPAR) 5 MG tablet Take 1/2 tablet twice daily 30 tablet 2   cyclobenzaprine (FLEXERIL) 5 MG tablet Take 5 mg by mouth 2 (two) times daily as needed.     gabapentin (NEURONTIN) 100 MG capsule Take 100-200 mg by mouth at bedtime.     midodrine (PROAMATINE) 10 MG tablet Take 5 mg by mouth See admin instructions. Take 1  tablet (5mg ) by mouth 30 minutes before dialysis and take 1 tablet (5mg ) by mouth during dialysis if needed for BP support     multivitamin (RENA-VIT) TABS tablet Take 1 tablet by mouth daily.     sucroferric oxyhydroxide (VELPHORO) 500 MG chewable tablet Chew 500-1,000 mg by mouth See admin instructions. Take 2 tablets (1000mg ) by mouth three times daily with meals and take 1 tablet (500mg ) by mouth with snacks     VITAMIN D PO Take by mouth.     No current facility-administered medications for this encounter.    Physical Findings: The patient is in no acute distress. Patient is alert and oriented.  vitals were not taken for this visit. .  No significant changes. Lungs are clear to auscultation bilaterally. Heart has regular rate and rhythm. No palpable cervical, supraclavicular, or axillary adenopathy. Abdomen soft, non-tender, normal bowel sounds.   Lab Findings: Lab Results  Component Value Date   WBC 7.8 12/14/2022   HGB 11.9 (L) 12/14/2022   HCT 35.3 (L) 12/14/2022   MCV 87.8 12/14/2022   PLT 287.0 12/14/2022    Radiographic Findings: DG Chest 2 View  Result Date: 12/14/2022 CLINICAL DATA:  Shortness of breath and dyspnea on exertion EXAM: CHEST - 2 VIEW COMPARISON:  Chest x-ray 11/09/2021.  CT 09/09/2022  FINDINGS: No pneumothorax or effusion. Normal cardiopericardial silhouette with a calcified aorta. No edema. Focal opacity in the medial right lung apex with some volume loss is again seen and unchanged from previous examination. Please correlate with history. Vascular stent along the upper extremity. IMPRESSION: Persistent opacity in the medial right lung apex with volume loss. Recommend continued follow-up. Please correlate with prior CT as well from 09/09/2022. No acute cardiopulmonary disease Electronically Signed   By: Karen Kays M.D.   On: 12/14/2022 13:35    Impression: Stage IB (cT2, N0, M0) NSCLC of the RUL  The patient is recovering from the effects of radiation.   ***  Plan:  ***   *** minutes of total time was spent for this patient encounter, including preparation, face-to-face counseling with the patient and coordination of care, physical exam, and documentation of the encounter. ____________________________________  Billie Lade, PhD, MD  This document serves as a record of services personally performed by Antony Blackbird, MD. It was created on his behalf by Neena Rhymes, a trained medical scribe. The creation of this record is based on the scribe's personal observations and the provider's statements to them. This document has been checked and approved by the attending provider.

## 2023-01-04 ENCOUNTER — Ambulatory Visit
Admission: RE | Admit: 2023-01-04 | Discharge: 2023-01-04 | Disposition: A | Discharge status: Home / Self Care | Payer: 59 | Source: Ambulatory Visit | Attending: Radiation Oncology | Admitting: Radiation Oncology

## 2023-01-04 ENCOUNTER — Encounter: Payer: Self-pay | Admitting: Radiation Oncology

## 2023-01-04 VITALS — BP 169/88 | HR 79 | Temp 98.0°F | Resp 18 | Ht 65.0 in | Wt 140.0 lb

## 2023-01-04 DIAGNOSIS — C3431 Malignant neoplasm of lower lobe, right bronchus or lung: Secondary | ICD-10-CM | POA: Diagnosis not present

## 2023-01-04 DIAGNOSIS — Z79899 Other long term (current) drug therapy: Secondary | ICD-10-CM | POA: Insufficient documentation

## 2023-01-04 DIAGNOSIS — Z923 Personal history of irradiation: Secondary | ICD-10-CM | POA: Diagnosis not present

## 2023-01-04 DIAGNOSIS — I700 Atherosclerosis of aorta: Secondary | ICD-10-CM | POA: Insufficient documentation

## 2023-01-04 DIAGNOSIS — C3411 Malignant neoplasm of upper lobe, right bronchus or lung: Secondary | ICD-10-CM | POA: Diagnosis not present

## 2023-01-04 DIAGNOSIS — Z87891 Personal history of nicotine dependence: Secondary | ICD-10-CM | POA: Diagnosis not present

## 2023-01-04 NOTE — Progress Notes (Signed)
Kimberly Frost is here today for follow up post radiation to the lung.  Lung Side: Right, patient completed treatment on 08/25/21  Does the patient complain of any of the following: Pain:No Shortness of breath w/wo exertion: Yes Cough:  Yes, patient recently completed antibiotics for cough.  Hemoptysis: No Pain with swallowing: No Swallowing/choking concerns: No Appetite: Fair. Appetite is improving.  Energy Level: Low  Post radiation skin Changes: No    Additional comments if applicable:  BP (!) 169/88 (BP Location: Right Arm)   Pulse 79   Temp 98 F (36.7 C)   Resp 18   Ht 5\' 5"  (1.651 m)   Wt 140 lb (63.5 kg)   SpO2 100%   BMI 23.30 kg/m

## 2023-01-05 ENCOUNTER — Other Ambulatory Visit: Payer: Self-pay | Admitting: Family

## 2023-01-05 DIAGNOSIS — I9589 Other hypotension: Secondary | ICD-10-CM | POA: Diagnosis not present

## 2023-01-05 DIAGNOSIS — N186 End stage renal disease: Secondary | ICD-10-CM | POA: Diagnosis not present

## 2023-01-05 DIAGNOSIS — D509 Iron deficiency anemia, unspecified: Secondary | ICD-10-CM | POA: Diagnosis not present

## 2023-01-05 DIAGNOSIS — Z992 Dependence on renal dialysis: Secondary | ICD-10-CM | POA: Diagnosis not present

## 2023-01-05 DIAGNOSIS — D631 Anemia in chronic kidney disease: Secondary | ICD-10-CM | POA: Diagnosis not present

## 2023-01-05 DIAGNOSIS — F411 Generalized anxiety disorder: Secondary | ICD-10-CM

## 2023-01-05 DIAGNOSIS — D689 Coagulation defect, unspecified: Secondary | ICD-10-CM | POA: Diagnosis not present

## 2023-01-05 DIAGNOSIS — N2581 Secondary hyperparathyroidism of renal origin: Secondary | ICD-10-CM | POA: Diagnosis not present

## 2023-01-05 NOTE — Telephone Encounter (Signed)
Called patient she is doing 1/2 tab every other day. She feels like it helps with her symptoms.

## 2023-01-05 NOTE — Telephone Encounter (Signed)
Can we see how she is doing on the buspirone for her anxiety?

## 2023-01-06 ENCOUNTER — Telehealth: Payer: Self-pay | Admitting: Radiology

## 2023-01-06 DIAGNOSIS — C3491 Malignant neoplasm of unspecified part of right bronchus or lung: Secondary | ICD-10-CM

## 2023-01-06 NOTE — Telephone Encounter (Signed)
I called the patient today to review the results of her most recent CT scan. Overall, it looks very favorable with no evidence of disease progression or recurrence. Kimberly Frost was very happy to hear these results. I have ordered a CT of the chest in 6 months and we will see her a couple days afterwards to review the results. She understands the stated plan and is in agreement. She knows to call with any questions or concerns in the meantime.     Kimberly Faster, PA-C

## 2023-01-07 DIAGNOSIS — I9589 Other hypotension: Secondary | ICD-10-CM | POA: Diagnosis not present

## 2023-01-07 DIAGNOSIS — N2581 Secondary hyperparathyroidism of renal origin: Secondary | ICD-10-CM | POA: Diagnosis not present

## 2023-01-07 DIAGNOSIS — D509 Iron deficiency anemia, unspecified: Secondary | ICD-10-CM | POA: Diagnosis not present

## 2023-01-07 DIAGNOSIS — Z992 Dependence on renal dialysis: Secondary | ICD-10-CM | POA: Diagnosis not present

## 2023-01-07 DIAGNOSIS — D689 Coagulation defect, unspecified: Secondary | ICD-10-CM | POA: Diagnosis not present

## 2023-01-07 DIAGNOSIS — D631 Anemia in chronic kidney disease: Secondary | ICD-10-CM | POA: Diagnosis not present

## 2023-01-07 DIAGNOSIS — N186 End stage renal disease: Secondary | ICD-10-CM | POA: Diagnosis not present

## 2023-01-09 DIAGNOSIS — N186 End stage renal disease: Secondary | ICD-10-CM | POA: Diagnosis not present

## 2023-01-09 DIAGNOSIS — Z992 Dependence on renal dialysis: Secondary | ICD-10-CM | POA: Diagnosis not present

## 2023-01-09 DIAGNOSIS — I9589 Other hypotension: Secondary | ICD-10-CM | POA: Diagnosis not present

## 2023-01-09 DIAGNOSIS — D509 Iron deficiency anemia, unspecified: Secondary | ICD-10-CM | POA: Diagnosis not present

## 2023-01-09 DIAGNOSIS — D689 Coagulation defect, unspecified: Secondary | ICD-10-CM | POA: Diagnosis not present

## 2023-01-09 DIAGNOSIS — D631 Anemia in chronic kidney disease: Secondary | ICD-10-CM | POA: Diagnosis not present

## 2023-01-09 DIAGNOSIS — N2581 Secondary hyperparathyroidism of renal origin: Secondary | ICD-10-CM | POA: Diagnosis not present

## 2023-01-12 DIAGNOSIS — Z992 Dependence on renal dialysis: Secondary | ICD-10-CM | POA: Diagnosis not present

## 2023-01-12 DIAGNOSIS — D631 Anemia in chronic kidney disease: Secondary | ICD-10-CM | POA: Diagnosis not present

## 2023-01-12 DIAGNOSIS — I9589 Other hypotension: Secondary | ICD-10-CM | POA: Diagnosis not present

## 2023-01-12 DIAGNOSIS — N186 End stage renal disease: Secondary | ICD-10-CM | POA: Diagnosis not present

## 2023-01-12 DIAGNOSIS — D509 Iron deficiency anemia, unspecified: Secondary | ICD-10-CM | POA: Diagnosis not present

## 2023-01-12 DIAGNOSIS — D689 Coagulation defect, unspecified: Secondary | ICD-10-CM | POA: Diagnosis not present

## 2023-01-12 DIAGNOSIS — N2581 Secondary hyperparathyroidism of renal origin: Secondary | ICD-10-CM | POA: Diagnosis not present

## 2023-01-14 DIAGNOSIS — D689 Coagulation defect, unspecified: Secondary | ICD-10-CM | POA: Diagnosis not present

## 2023-01-14 DIAGNOSIS — N186 End stage renal disease: Secondary | ICD-10-CM | POA: Diagnosis not present

## 2023-01-14 DIAGNOSIS — N2581 Secondary hyperparathyroidism of renal origin: Secondary | ICD-10-CM | POA: Diagnosis not present

## 2023-01-14 DIAGNOSIS — Z992 Dependence on renal dialysis: Secondary | ICD-10-CM | POA: Diagnosis not present

## 2023-01-14 DIAGNOSIS — I9589 Other hypotension: Secondary | ICD-10-CM | POA: Diagnosis not present

## 2023-01-14 DIAGNOSIS — D631 Anemia in chronic kidney disease: Secondary | ICD-10-CM | POA: Diagnosis not present

## 2023-01-14 DIAGNOSIS — D509 Iron deficiency anemia, unspecified: Secondary | ICD-10-CM | POA: Diagnosis not present

## 2023-01-16 DIAGNOSIS — N2581 Secondary hyperparathyroidism of renal origin: Secondary | ICD-10-CM | POA: Diagnosis not present

## 2023-01-16 DIAGNOSIS — D509 Iron deficiency anemia, unspecified: Secondary | ICD-10-CM | POA: Diagnosis not present

## 2023-01-16 DIAGNOSIS — I9589 Other hypotension: Secondary | ICD-10-CM | POA: Diagnosis not present

## 2023-01-16 DIAGNOSIS — D689 Coagulation defect, unspecified: Secondary | ICD-10-CM | POA: Diagnosis not present

## 2023-01-16 DIAGNOSIS — D631 Anemia in chronic kidney disease: Secondary | ICD-10-CM | POA: Diagnosis not present

## 2023-01-16 DIAGNOSIS — N186 End stage renal disease: Secondary | ICD-10-CM | POA: Diagnosis not present

## 2023-01-16 DIAGNOSIS — Z992 Dependence on renal dialysis: Secondary | ICD-10-CM | POA: Diagnosis not present

## 2023-01-19 DIAGNOSIS — N2581 Secondary hyperparathyroidism of renal origin: Secondary | ICD-10-CM | POA: Diagnosis not present

## 2023-01-19 DIAGNOSIS — Z992 Dependence on renal dialysis: Secondary | ICD-10-CM | POA: Diagnosis not present

## 2023-01-19 DIAGNOSIS — N186 End stage renal disease: Secondary | ICD-10-CM | POA: Diagnosis not present

## 2023-01-19 DIAGNOSIS — I9589 Other hypotension: Secondary | ICD-10-CM | POA: Diagnosis not present

## 2023-01-19 DIAGNOSIS — D509 Iron deficiency anemia, unspecified: Secondary | ICD-10-CM | POA: Diagnosis not present

## 2023-01-19 DIAGNOSIS — D631 Anemia in chronic kidney disease: Secondary | ICD-10-CM | POA: Diagnosis not present

## 2023-01-19 DIAGNOSIS — D689 Coagulation defect, unspecified: Secondary | ICD-10-CM | POA: Diagnosis not present

## 2023-01-20 ENCOUNTER — Ambulatory Visit: Payer: 59 | Admitting: Family

## 2023-01-20 ENCOUNTER — Ambulatory Visit: Payer: 59 | Admitting: Family Medicine

## 2023-01-20 ENCOUNTER — Encounter: Payer: Self-pay | Admitting: Family Medicine

## 2023-01-20 VITALS — BP 164/84 | HR 96 | Temp 99.4°F | Ht 65.0 in | Wt 134.8 lb

## 2023-01-20 DIAGNOSIS — R42 Dizziness and giddiness: Secondary | ICD-10-CM

## 2023-01-20 DIAGNOSIS — R059 Cough, unspecified: Secondary | ICD-10-CM

## 2023-01-20 DIAGNOSIS — F411 Generalized anxiety disorder: Secondary | ICD-10-CM

## 2023-01-20 DIAGNOSIS — R030 Elevated blood-pressure reading, without diagnosis of hypertension: Secondary | ICD-10-CM

## 2023-01-20 DIAGNOSIS — R0602 Shortness of breath: Secondary | ICD-10-CM | POA: Diagnosis not present

## 2023-01-20 DIAGNOSIS — F321 Major depressive disorder, single episode, moderate: Secondary | ICD-10-CM | POA: Insufficient documentation

## 2023-01-20 LAB — POC COVID19 BINAXNOW: SARS Coronavirus 2 Ag: NEGATIVE

## 2023-01-20 MED ORDER — SERTRALINE HCL 25 MG PO TABS: 25.0000 mg | ORAL_TABLET | Freq: Every day | ORAL | 3 refills | Status: DC

## 2023-01-20 NOTE — Assessment & Plan Note (Addendum)
Acute, poor control.  Start SSRI and refer to counseling. She can follow-up with PCP in 2 to 4 weeks for repeat evaluation and possible increase in dose. Of note if she has side effects to sertraline could consider Remeron as she would like to have appetite increased.

## 2023-01-20 NOTE — Progress Notes (Signed)
Patient ID: Kimberly Frost, female    DOB: 03-May-1950, 72 y.o.   MRN: [id number]  This visit was conducted in person.  BP (!) 164/84   Pulse 96   Temp 99.4 F (37.4 C) (Oral)   Ht 5\' 5"  (1.651 m)   Wt 134 lb 12.8 oz (61.1 kg)   SpO2 97%   BMI 22.43 kg/m    CC:  Chief Complaint  Patient presents with   Shortness of Breath   Dizziness    Subjective:   HPI: Kimberly Frost is a 72 y.o. female  patient of Mort Sawyers, NP  with history of anemia, ESRD, CKD  presenting on 01/20/2023 for Shortness of Breath and Dizziness   Reviewed last office visit note from PCP December 14, 2022 Patient reported similar symptoms of dizziness, feeling wobbly and nervous all over the place, shortness of breath. Evaluated with CBC, thyroid test chest x-ray and iron panel.  Hemoglobin 11.9, TSH 2.19 No change  on CMET, ESRD Cr 11.07 on dialysis.  Patient currently has lung mass followed by oncology.  Started at that office visit on buspirone 2.5 mg twice daily...  did not like it... did not calm her down at all. She is anxious every day.. feeling overwhelmed. She is sad, frusrated.  Poor sleep at night.. gabapentin helps.  Promethazine for nausea.. did not help her.   Bp has been high lately... has not been taking  midodrnie given it is high.    She continues to note dizziness and SOB.Marland Kitchen last few minutes... notes lying down to standing.  No chest pain. Always tired with dialysis Hg 11.8    Last  2-3 week had ST and coughing fits... better now.. worried about sick patient at dialysis.  History of hypertension..  Not on any medication other than midodrine for hypotension prior to dialysis She is due for dialysis tomorrow. BP Readings from Last 3 Encounters:  01/20/23 (!) 164/84  01/04/23 (!) 169/88  12/14/22 134/80     GAD7:7 PHQ9: 16     Relevant past medical, surgical, family and social history reviewed and updated as indicated. Interim medical history since our last visit  reviewed. Allergies and medications reviewed and updated. Outpatient Medications Prior to Visit  Medication Sig Dispense Refill   acetaminophen (TYLENOL) 500 MG tablet Take 1,000 mg by mouth every 6 (six) hours as needed for moderate pain or headache.     atorvastatin (LIPITOR) 80 MG tablet Take 1 tablet (80 mg total) by mouth daily. 90 tablet 3   busPIRone (BUSPAR) 5 MG tablet TAKE 1/2 TABLET BY MOUTH TWICE DAILY 90 tablet 1   cyclobenzaprine (FLEXERIL) 5 MG tablet Take 5 mg by mouth 2 (two) times daily as needed.     gabapentin (NEURONTIN) 100 MG capsule Take 100-200 mg by mouth at bedtime.     midodrine (PROAMATINE) 10 MG tablet Take 5 mg by mouth See admin instructions. Take 1 tablet (5mg ) by mouth 30 minutes before dialysis and take 1 tablet (5mg ) by mouth during dialysis if needed for BP support     multivitamin (RENA-VIT) TABS tablet Take 1 tablet by mouth daily.     sucroferric oxyhydroxide (VELPHORO) 500 MG chewable tablet Chew 500-1,000 mg by mouth See admin instructions. Take 2 tablets (1000mg ) by mouth three times daily with meals and take 1 tablet (500mg ) by mouth with snacks     VITAMIN D PO Take by mouth.     No facility-administered medications prior to visit.  Per HPI unless specifically indicated in ROS section below Review of Systems  Constitutional:  Negative for fatigue and fever.  HENT:  Negative for congestion.   Eyes:  Negative for pain.  Respiratory:  Negative for cough and shortness of breath.   Cardiovascular:  Negative for chest pain, palpitations and leg swelling.  Gastrointestinal:  Negative for abdominal pain.  Genitourinary:  Negative for dysuria and vaginal bleeding.  Musculoskeletal:  Negative for back pain.  Neurological:  Negative for syncope, light-headedness and headaches.  Psychiatric/Behavioral:  Positive for agitation. Negative for dysphoric mood.    Objective:  BP (!) 164/84   Pulse 96   Temp 99.4 F (37.4 C) (Oral)   Ht 5\' 5"  (1.651 m)    Wt 134 lb 12.8 oz (61.1 kg)   SpO2 97%   BMI 22.43 kg/m   Wt Readings from Last 3 Encounters:  01/20/23 134 lb 12.8 oz (61.1 kg)  01/04/23 140 lb (63.5 kg)  12/14/22 140 lb 3.2 oz (63.6 kg)      Physical Exam Constitutional:      General: She is not in acute distress.    Appearance: Normal appearance. She is well-developed. She is not ill-appearing or toxic-appearing.  HENT:     Head: Normocephalic.     Right Ear: Hearing, tympanic membrane, ear canal and external ear normal. Tympanic membrane is not erythematous, retracted or bulging.     Left Ear: Hearing, tympanic membrane, ear canal and external ear normal. Tympanic membrane is not erythematous, retracted or bulging.     Nose: No mucosal edema or rhinorrhea.     Right Sinus: No maxillary sinus tenderness or frontal sinus tenderness.     Left Sinus: No maxillary sinus tenderness or frontal sinus tenderness.     Mouth/Throat:     Pharynx: Uvula midline.  Eyes:     General: Lids are normal. Lids are everted, no foreign bodies appreciated.     Conjunctiva/sclera: Conjunctivae normal.     Pupils: Pupils are equal, round, and reactive to light.  Neck:     Thyroid: No thyroid mass or thyromegaly.     Vascular: No carotid bruit.     Trachea: Trachea normal.  Cardiovascular:     Rate and Rhythm: Normal rate and regular rhythm.     Pulses: Normal pulses.     Heart sounds: Normal heart sounds, S1 normal and S2 normal. No murmur heard.    No friction rub. No gallop.  Pulmonary:     Effort: Pulmonary effort is normal. Tachypnea present. No respiratory distress.     Breath sounds: Normal breath sounds. No decreased breath sounds, wheezing, rhonchi or rales.  Abdominal:     General: Bowel sounds are normal.     Palpations: Abdomen is soft.     Tenderness: There is no abdominal tenderness.  Musculoskeletal:     Cervical back: Normal range of motion and neck supple.  Skin:    General: Skin is warm and dry.     Findings: No rash.   Neurological:     Mental Status: She is alert.  Psychiatric:        Mood and Affect: Mood is anxious. Mood is not depressed.        Speech: Speech normal.        Behavior: Behavior is agitated. Behavior is cooperative.        Thought Content: Thought content normal.        Judgment: Judgment normal.  Results for orders placed or performed in visit on 12/14/22  Lipid panel   Collection Time: 12/14/22  1:20 PM  Result Value Ref Range   Cholesterol 232 (H) 0 - 200 mg/dL   Triglycerides 086.5 0.0 - 149.0 mg/dL   HDL 78.46 >96.29 mg/dL   VLDL 52.8 0.0 - 41.3 mg/dL   LDL Cholesterol 244 (H) 0 - 99 mg/dL   Total CHOL/HDL Ratio 5    NonHDL 181.36   CBC   Collection Time: 12/14/22  1:20 PM  Result Value Ref Range   WBC 7.8 4.0 - 10.5 K/uL   RBC 4.03 3.87 - 5.11 Mil/uL   Platelets 287.0 150.0 - 400.0 K/uL   Hemoglobin 11.9 (L) 12.0 - 15.0 g/dL   HCT 01.0 (L) 27.2 - 53.6 %   MCV 87.8 78.0 - 100.0 fl   MCHC 33.6 30.0 - 36.0 g/dL   RDW 64.4 (H) 03.4 - 74.2 %  TSH   Collection Time: 12/14/22  1:20 PM  Result Value Ref Range   TSH 2.19 0.35 - 5.50 uIU/mL  Comprehensive metabolic panel   Collection Time: 12/14/22  1:20 PM  Result Value Ref Range   Sodium 142 135 - 145 mEq/L   Potassium 4.2 3.5 - 5.1 mEq/L   Chloride 92 (L) 96 - 112 mEq/L   CO2 32 19 - 32 mEq/L   Glucose, Bld 100 (H) 70 - 99 mg/dL   BUN 63 (H) 6 - 23 mg/dL   Creatinine, Ser 59.56 (HH) 0.40 - 1.20 mg/dL   Total Bilirubin 0.5 0.2 - 1.2 mg/dL   Alkaline Phosphatase 67 39 - 117 U/L   AST 19 0 - 37 U/L   ALT 14 0 - 35 U/L   Total Protein 7.1 6.0 - 8.3 g/dL   Albumin 4.3 3.5 - 5.2 g/dL   GFR 3.87 (LL) >56.43 mL/min   Calcium 10.5 8.4 - 10.5 mg/dL  IBC + Ferritin   Collection Time: 12/14/22  1:20 PM  Result Value Ref Range   Iron 80 42 - 145 ug/dL   Transferrin 329.5 (L) 212.0 - 360.0 mg/dL   Saturation Ratios 18.8 20.0 - 50.0 %   Ferritin 363.1 (H) 10.0 - 291.0 ng/mL   TIBC 294.0 250.0 - 450.0 mcg/dL     Assessment and Plan EKG: normal EKG, normal sinus rhythm, unchanged from previous tracings 2023.  There are no diagnoses linked to this encounter.  No follow-ups on file.   Kerby Nora, MD

## 2023-01-20 NOTE — Assessment & Plan Note (Addendum)
Acute, poor control likely contributing to blood pressure fluctuations and feeling poorly. Patient states she is very overwhelmed. She had side effects to buspirone. Will try a trial of sertraline 25 mg p.o. daily, let patient know it would take 3 to 4 weeks before she noted improvement.

## 2023-01-20 NOTE — Patient Instructions (Signed)
Follow BP at home.. call if > 140/90 consistently as anxiety is improving.  Start sertraline at night.  Call to set up counselor.

## 2023-01-20 NOTE — Assessment & Plan Note (Signed)
Acute, previously has been hypotensive at dialysis but states blood pressure is remaining elevated.  She will follow this at home and if it continues to be elevated, consider blood pressure medication on nondialysis days. Elevated blood pressure and heart rate may also be secondary to anxiety and agitation.

## 2023-01-20 NOTE — Assessment & Plan Note (Addendum)
Acute, intermittent Lab evaluation negative. May be secondary to decreased appetite and poor p.o. intake.  May be secondary to recent elevated blood pressure versus anxiety.

## 2023-01-20 NOTE — Assessment & Plan Note (Signed)
Acute, intermittent, ongoing for greater than a month. Workup with PCP unremarkable: Including CBC, TSH, chest x-ray ( no fluid overload) and iron panel. Labs done at dialysis reviewed, stable hemoglobin, normal electrolytes  EKG in office unchanged, normal sinus rhythm, no sign of LVH.

## 2023-01-21 DIAGNOSIS — N2581 Secondary hyperparathyroidism of renal origin: Secondary | ICD-10-CM | POA: Diagnosis not present

## 2023-01-21 DIAGNOSIS — D631 Anemia in chronic kidney disease: Secondary | ICD-10-CM | POA: Diagnosis not present

## 2023-01-21 DIAGNOSIS — I9589 Other hypotension: Secondary | ICD-10-CM | POA: Diagnosis not present

## 2023-01-21 DIAGNOSIS — N186 End stage renal disease: Secondary | ICD-10-CM | POA: Diagnosis not present

## 2023-01-21 DIAGNOSIS — D509 Iron deficiency anemia, unspecified: Secondary | ICD-10-CM | POA: Diagnosis not present

## 2023-01-21 DIAGNOSIS — Z992 Dependence on renal dialysis: Secondary | ICD-10-CM | POA: Diagnosis not present

## 2023-01-21 DIAGNOSIS — D689 Coagulation defect, unspecified: Secondary | ICD-10-CM | POA: Diagnosis not present

## 2023-01-23 DIAGNOSIS — D631 Anemia in chronic kidney disease: Secondary | ICD-10-CM | POA: Diagnosis not present

## 2023-01-23 DIAGNOSIS — N186 End stage renal disease: Secondary | ICD-10-CM | POA: Diagnosis not present

## 2023-01-23 DIAGNOSIS — Z992 Dependence on renal dialysis: Secondary | ICD-10-CM | POA: Diagnosis not present

## 2023-01-23 DIAGNOSIS — N2581 Secondary hyperparathyroidism of renal origin: Secondary | ICD-10-CM | POA: Diagnosis not present

## 2023-01-23 DIAGNOSIS — D509 Iron deficiency anemia, unspecified: Secondary | ICD-10-CM | POA: Diagnosis not present

## 2023-01-23 DIAGNOSIS — D689 Coagulation defect, unspecified: Secondary | ICD-10-CM | POA: Diagnosis not present

## 2023-01-23 DIAGNOSIS — I9589 Other hypotension: Secondary | ICD-10-CM | POA: Diagnosis not present

## 2023-01-25 DIAGNOSIS — N2581 Secondary hyperparathyroidism of renal origin: Secondary | ICD-10-CM | POA: Diagnosis not present

## 2023-01-25 DIAGNOSIS — D631 Anemia in chronic kidney disease: Secondary | ICD-10-CM | POA: Diagnosis not present

## 2023-01-25 DIAGNOSIS — D689 Coagulation defect, unspecified: Secondary | ICD-10-CM | POA: Diagnosis not present

## 2023-01-25 DIAGNOSIS — N186 End stage renal disease: Secondary | ICD-10-CM | POA: Diagnosis not present

## 2023-01-25 DIAGNOSIS — D509 Iron deficiency anemia, unspecified: Secondary | ICD-10-CM | POA: Diagnosis not present

## 2023-01-25 DIAGNOSIS — Z992 Dependence on renal dialysis: Secondary | ICD-10-CM | POA: Diagnosis not present

## 2023-01-25 DIAGNOSIS — I9589 Other hypotension: Secondary | ICD-10-CM | POA: Diagnosis not present

## 2023-01-28 DIAGNOSIS — I9589 Other hypotension: Secondary | ICD-10-CM | POA: Diagnosis not present

## 2023-01-28 DIAGNOSIS — N186 End stage renal disease: Secondary | ICD-10-CM | POA: Diagnosis not present

## 2023-01-28 DIAGNOSIS — Z992 Dependence on renal dialysis: Secondary | ICD-10-CM | POA: Diagnosis not present

## 2023-01-28 DIAGNOSIS — D631 Anemia in chronic kidney disease: Secondary | ICD-10-CM | POA: Diagnosis not present

## 2023-01-28 DIAGNOSIS — D509 Iron deficiency anemia, unspecified: Secondary | ICD-10-CM | POA: Diagnosis not present

## 2023-01-28 DIAGNOSIS — N2581 Secondary hyperparathyroidism of renal origin: Secondary | ICD-10-CM | POA: Diagnosis not present

## 2023-01-28 DIAGNOSIS — D689 Coagulation defect, unspecified: Secondary | ICD-10-CM | POA: Diagnosis not present

## 2023-01-30 DIAGNOSIS — N2581 Secondary hyperparathyroidism of renal origin: Secondary | ICD-10-CM | POA: Diagnosis not present

## 2023-01-30 DIAGNOSIS — I9589 Other hypotension: Secondary | ICD-10-CM | POA: Diagnosis not present

## 2023-01-30 DIAGNOSIS — D689 Coagulation defect, unspecified: Secondary | ICD-10-CM | POA: Diagnosis not present

## 2023-01-30 DIAGNOSIS — D509 Iron deficiency anemia, unspecified: Secondary | ICD-10-CM | POA: Diagnosis not present

## 2023-01-30 DIAGNOSIS — Z992 Dependence on renal dialysis: Secondary | ICD-10-CM | POA: Diagnosis not present

## 2023-01-30 DIAGNOSIS — N186 End stage renal disease: Secondary | ICD-10-CM | POA: Diagnosis not present

## 2023-01-30 DIAGNOSIS — D631 Anemia in chronic kidney disease: Secondary | ICD-10-CM | POA: Diagnosis not present

## 2023-02-01 DIAGNOSIS — Z992 Dependence on renal dialysis: Secondary | ICD-10-CM | POA: Diagnosis not present

## 2023-02-01 DIAGNOSIS — D689 Coagulation defect, unspecified: Secondary | ICD-10-CM | POA: Diagnosis not present

## 2023-02-01 DIAGNOSIS — I9589 Other hypotension: Secondary | ICD-10-CM | POA: Diagnosis not present

## 2023-02-01 DIAGNOSIS — D509 Iron deficiency anemia, unspecified: Secondary | ICD-10-CM | POA: Diagnosis not present

## 2023-02-01 DIAGNOSIS — N186 End stage renal disease: Secondary | ICD-10-CM | POA: Diagnosis not present

## 2023-02-01 DIAGNOSIS — N2581 Secondary hyperparathyroidism of renal origin: Secondary | ICD-10-CM | POA: Diagnosis not present

## 2023-02-01 DIAGNOSIS — D631 Anemia in chronic kidney disease: Secondary | ICD-10-CM | POA: Diagnosis not present

## 2023-02-02 DIAGNOSIS — I129 Hypertensive chronic kidney disease with stage 1 through stage 4 chronic kidney disease, or unspecified chronic kidney disease: Secondary | ICD-10-CM | POA: Diagnosis not present

## 2023-02-02 DIAGNOSIS — N186 End stage renal disease: Secondary | ICD-10-CM | POA: Diagnosis not present

## 2023-02-02 DIAGNOSIS — Z992 Dependence on renal dialysis: Secondary | ICD-10-CM | POA: Diagnosis not present

## 2023-02-04 ENCOUNTER — Ambulatory Visit: Payer: 59 | Admitting: Family

## 2023-02-04 DIAGNOSIS — D509 Iron deficiency anemia, unspecified: Secondary | ICD-10-CM | POA: Diagnosis not present

## 2023-02-04 DIAGNOSIS — N186 End stage renal disease: Secondary | ICD-10-CM | POA: Diagnosis not present

## 2023-02-04 DIAGNOSIS — D631 Anemia in chronic kidney disease: Secondary | ICD-10-CM | POA: Diagnosis not present

## 2023-02-04 DIAGNOSIS — D689 Coagulation defect, unspecified: Secondary | ICD-10-CM | POA: Diagnosis not present

## 2023-02-04 DIAGNOSIS — Z992 Dependence on renal dialysis: Secondary | ICD-10-CM | POA: Diagnosis not present

## 2023-02-04 DIAGNOSIS — N2581 Secondary hyperparathyroidism of renal origin: Secondary | ICD-10-CM | POA: Diagnosis not present

## 2023-02-06 DIAGNOSIS — N186 End stage renal disease: Secondary | ICD-10-CM | POA: Diagnosis not present

## 2023-02-06 DIAGNOSIS — D689 Coagulation defect, unspecified: Secondary | ICD-10-CM | POA: Diagnosis not present

## 2023-02-06 DIAGNOSIS — N2581 Secondary hyperparathyroidism of renal origin: Secondary | ICD-10-CM | POA: Diagnosis not present

## 2023-02-06 DIAGNOSIS — Z992 Dependence on renal dialysis: Secondary | ICD-10-CM | POA: Diagnosis not present

## 2023-02-06 DIAGNOSIS — D509 Iron deficiency anemia, unspecified: Secondary | ICD-10-CM | POA: Diagnosis not present

## 2023-02-06 DIAGNOSIS — D631 Anemia in chronic kidney disease: Secondary | ICD-10-CM | POA: Diagnosis not present

## 2023-02-08 ENCOUNTER — Ambulatory Visit: Payer: 59 | Admitting: Family

## 2023-02-09 DIAGNOSIS — D509 Iron deficiency anemia, unspecified: Secondary | ICD-10-CM | POA: Diagnosis not present

## 2023-02-09 DIAGNOSIS — D689 Coagulation defect, unspecified: Secondary | ICD-10-CM | POA: Diagnosis not present

## 2023-02-09 DIAGNOSIS — D631 Anemia in chronic kidney disease: Secondary | ICD-10-CM | POA: Diagnosis not present

## 2023-02-09 DIAGNOSIS — N186 End stage renal disease: Secondary | ICD-10-CM | POA: Diagnosis not present

## 2023-02-09 DIAGNOSIS — N2581 Secondary hyperparathyroidism of renal origin: Secondary | ICD-10-CM | POA: Diagnosis not present

## 2023-02-09 DIAGNOSIS — Z992 Dependence on renal dialysis: Secondary | ICD-10-CM | POA: Diagnosis not present

## 2023-02-11 ENCOUNTER — Telehealth: Payer: Self-pay

## 2023-02-11 ENCOUNTER — Other Ambulatory Visit: Payer: Self-pay | Admitting: Family Medicine

## 2023-02-11 DIAGNOSIS — N2581 Secondary hyperparathyroidism of renal origin: Secondary | ICD-10-CM | POA: Diagnosis not present

## 2023-02-11 DIAGNOSIS — D689 Coagulation defect, unspecified: Secondary | ICD-10-CM | POA: Diagnosis not present

## 2023-02-11 DIAGNOSIS — N186 End stage renal disease: Secondary | ICD-10-CM | POA: Diagnosis not present

## 2023-02-11 DIAGNOSIS — D509 Iron deficiency anemia, unspecified: Secondary | ICD-10-CM | POA: Diagnosis not present

## 2023-02-11 DIAGNOSIS — Z992 Dependence on renal dialysis: Secondary | ICD-10-CM | POA: Diagnosis not present

## 2023-02-11 DIAGNOSIS — D631 Anemia in chronic kidney disease: Secondary | ICD-10-CM | POA: Diagnosis not present

## 2023-02-11 NOTE — Telephone Encounter (Signed)
 Copied from CRM [id number]. Topic: General - Call Back - No Documentation >> Feb 11, 2023  9:41 AM Truddie Crumble wrote: Reason for CRM: pt returning a call back to the office. No documentation was found

## 2023-02-12 DIAGNOSIS — N2581 Secondary hyperparathyroidism of renal origin: Secondary | ICD-10-CM | POA: Diagnosis not present

## 2023-02-12 DIAGNOSIS — N186 End stage renal disease: Secondary | ICD-10-CM | POA: Diagnosis not present

## 2023-02-12 DIAGNOSIS — D631 Anemia in chronic kidney disease: Secondary | ICD-10-CM | POA: Diagnosis not present

## 2023-02-12 DIAGNOSIS — D689 Coagulation defect, unspecified: Secondary | ICD-10-CM | POA: Diagnosis not present

## 2023-02-12 DIAGNOSIS — Z992 Dependence on renal dialysis: Secondary | ICD-10-CM | POA: Diagnosis not present

## 2023-02-12 DIAGNOSIS — D509 Iron deficiency anemia, unspecified: Secondary | ICD-10-CM | POA: Diagnosis not present

## 2023-02-15 ENCOUNTER — Ambulatory Visit: Payer: 59 | Admitting: Family

## 2023-02-15 ENCOUNTER — Telehealth: Payer: Self-pay

## 2023-02-15 NOTE — Telephone Encounter (Signed)
 Marland Kitchen

## 2023-02-15 NOTE — Telephone Encounter (Signed)
 Sending note to [facility] admin.

## 2023-02-16 DIAGNOSIS — D689 Coagulation defect, unspecified: Secondary | ICD-10-CM | POA: Diagnosis not present

## 2023-02-16 DIAGNOSIS — N186 End stage renal disease: Secondary | ICD-10-CM | POA: Diagnosis not present

## 2023-02-16 DIAGNOSIS — Z992 Dependence on renal dialysis: Secondary | ICD-10-CM | POA: Diagnosis not present

## 2023-02-16 DIAGNOSIS — D631 Anemia in chronic kidney disease: Secondary | ICD-10-CM | POA: Diagnosis not present

## 2023-02-16 DIAGNOSIS — N2581 Secondary hyperparathyroidism of renal origin: Secondary | ICD-10-CM | POA: Diagnosis not present

## 2023-02-16 DIAGNOSIS — D509 Iron deficiency anemia, unspecified: Secondary | ICD-10-CM | POA: Diagnosis not present

## 2023-02-18 DIAGNOSIS — D509 Iron deficiency anemia, unspecified: Secondary | ICD-10-CM | POA: Diagnosis not present

## 2023-02-18 DIAGNOSIS — D689 Coagulation defect, unspecified: Secondary | ICD-10-CM | POA: Diagnosis not present

## 2023-02-18 DIAGNOSIS — N186 End stage renal disease: Secondary | ICD-10-CM | POA: Diagnosis not present

## 2023-02-18 DIAGNOSIS — N2581 Secondary hyperparathyroidism of renal origin: Secondary | ICD-10-CM | POA: Diagnosis not present

## 2023-02-18 DIAGNOSIS — Z992 Dependence on renal dialysis: Secondary | ICD-10-CM | POA: Diagnosis not present

## 2023-02-18 DIAGNOSIS — D631 Anemia in chronic kidney disease: Secondary | ICD-10-CM | POA: Diagnosis not present

## 2023-02-20 DIAGNOSIS — N186 End stage renal disease: Secondary | ICD-10-CM | POA: Diagnosis not present

## 2023-02-20 DIAGNOSIS — D689 Coagulation defect, unspecified: Secondary | ICD-10-CM | POA: Diagnosis not present

## 2023-02-20 DIAGNOSIS — D631 Anemia in chronic kidney disease: Secondary | ICD-10-CM | POA: Diagnosis not present

## 2023-02-20 DIAGNOSIS — N2581 Secondary hyperparathyroidism of renal origin: Secondary | ICD-10-CM | POA: Diagnosis not present

## 2023-02-20 DIAGNOSIS — D509 Iron deficiency anemia, unspecified: Secondary | ICD-10-CM | POA: Diagnosis not present

## 2023-02-20 DIAGNOSIS — Z992 Dependence on renal dialysis: Secondary | ICD-10-CM | POA: Diagnosis not present

## 2023-02-23 DIAGNOSIS — N186 End stage renal disease: Secondary | ICD-10-CM | POA: Diagnosis not present

## 2023-02-23 DIAGNOSIS — D509 Iron deficiency anemia, unspecified: Secondary | ICD-10-CM | POA: Diagnosis not present

## 2023-02-23 DIAGNOSIS — D689 Coagulation defect, unspecified: Secondary | ICD-10-CM | POA: Diagnosis not present

## 2023-02-23 DIAGNOSIS — Z992 Dependence on renal dialysis: Secondary | ICD-10-CM | POA: Diagnosis not present

## 2023-02-23 DIAGNOSIS — N2581 Secondary hyperparathyroidism of renal origin: Secondary | ICD-10-CM | POA: Diagnosis not present

## 2023-02-23 DIAGNOSIS — D631 Anemia in chronic kidney disease: Secondary | ICD-10-CM | POA: Diagnosis not present

## 2023-02-25 DIAGNOSIS — D689 Coagulation defect, unspecified: Secondary | ICD-10-CM | POA: Diagnosis not present

## 2023-02-25 DIAGNOSIS — N2581 Secondary hyperparathyroidism of renal origin: Secondary | ICD-10-CM | POA: Diagnosis not present

## 2023-02-25 DIAGNOSIS — D509 Iron deficiency anemia, unspecified: Secondary | ICD-10-CM | POA: Diagnosis not present

## 2023-02-25 DIAGNOSIS — N186 End stage renal disease: Secondary | ICD-10-CM | POA: Diagnosis not present

## 2023-02-25 DIAGNOSIS — D631 Anemia in chronic kidney disease: Secondary | ICD-10-CM | POA: Diagnosis not present

## 2023-02-25 DIAGNOSIS — Z992 Dependence on renal dialysis: Secondary | ICD-10-CM | POA: Diagnosis not present

## 2023-02-27 DIAGNOSIS — D631 Anemia in chronic kidney disease: Secondary | ICD-10-CM | POA: Diagnosis not present

## 2023-02-27 DIAGNOSIS — N186 End stage renal disease: Secondary | ICD-10-CM | POA: Diagnosis not present

## 2023-02-27 DIAGNOSIS — N2581 Secondary hyperparathyroidism of renal origin: Secondary | ICD-10-CM | POA: Diagnosis not present

## 2023-02-27 DIAGNOSIS — Z992 Dependence on renal dialysis: Secondary | ICD-10-CM | POA: Diagnosis not present

## 2023-02-27 DIAGNOSIS — D689 Coagulation defect, unspecified: Secondary | ICD-10-CM | POA: Diagnosis not present

## 2023-02-27 DIAGNOSIS — D509 Iron deficiency anemia, unspecified: Secondary | ICD-10-CM | POA: Diagnosis not present

## 2023-03-02 DIAGNOSIS — D631 Anemia in chronic kidney disease: Secondary | ICD-10-CM | POA: Diagnosis not present

## 2023-03-02 DIAGNOSIS — D689 Coagulation defect, unspecified: Secondary | ICD-10-CM | POA: Diagnosis not present

## 2023-03-02 DIAGNOSIS — N186 End stage renal disease: Secondary | ICD-10-CM | POA: Diagnosis not present

## 2023-03-02 DIAGNOSIS — D509 Iron deficiency anemia, unspecified: Secondary | ICD-10-CM | POA: Diagnosis not present

## 2023-03-02 DIAGNOSIS — N2581 Secondary hyperparathyroidism of renal origin: Secondary | ICD-10-CM | POA: Diagnosis not present

## 2023-03-02 DIAGNOSIS — Z992 Dependence on renal dialysis: Secondary | ICD-10-CM | POA: Diagnosis not present

## 2023-03-04 DIAGNOSIS — N186 End stage renal disease: Secondary | ICD-10-CM | POA: Diagnosis not present

## 2023-03-04 DIAGNOSIS — D509 Iron deficiency anemia, unspecified: Secondary | ICD-10-CM | POA: Diagnosis not present

## 2023-03-04 DIAGNOSIS — N2581 Secondary hyperparathyroidism of renal origin: Secondary | ICD-10-CM | POA: Diagnosis not present

## 2023-03-04 DIAGNOSIS — D631 Anemia in chronic kidney disease: Secondary | ICD-10-CM | POA: Diagnosis not present

## 2023-03-04 DIAGNOSIS — D689 Coagulation defect, unspecified: Secondary | ICD-10-CM | POA: Diagnosis not present

## 2023-03-04 DIAGNOSIS — Z992 Dependence on renal dialysis: Secondary | ICD-10-CM | POA: Diagnosis not present

## 2023-03-05 DIAGNOSIS — Z992 Dependence on renal dialysis: Secondary | ICD-10-CM | POA: Diagnosis not present

## 2023-03-05 DIAGNOSIS — N186 End stage renal disease: Secondary | ICD-10-CM | POA: Diagnosis not present

## 2023-03-05 DIAGNOSIS — I129 Hypertensive chronic kidney disease with stage 1 through stage 4 chronic kidney disease, or unspecified chronic kidney disease: Secondary | ICD-10-CM | POA: Diagnosis not present

## 2023-03-06 DIAGNOSIS — D509 Iron deficiency anemia, unspecified: Secondary | ICD-10-CM | POA: Diagnosis not present

## 2023-03-06 DIAGNOSIS — N2581 Secondary hyperparathyroidism of renal origin: Secondary | ICD-10-CM | POA: Diagnosis not present

## 2023-03-06 DIAGNOSIS — D631 Anemia in chronic kidney disease: Secondary | ICD-10-CM | POA: Diagnosis not present

## 2023-03-06 DIAGNOSIS — D689 Coagulation defect, unspecified: Secondary | ICD-10-CM | POA: Diagnosis not present

## 2023-03-06 DIAGNOSIS — N186 End stage renal disease: Secondary | ICD-10-CM | POA: Diagnosis not present

## 2023-03-06 DIAGNOSIS — Z992 Dependence on renal dialysis: Secondary | ICD-10-CM | POA: Diagnosis not present

## 2023-03-09 DIAGNOSIS — N2581 Secondary hyperparathyroidism of renal origin: Secondary | ICD-10-CM | POA: Diagnosis not present

## 2023-03-09 DIAGNOSIS — D509 Iron deficiency anemia, unspecified: Secondary | ICD-10-CM | POA: Diagnosis not present

## 2023-03-09 DIAGNOSIS — Z992 Dependence on renal dialysis: Secondary | ICD-10-CM | POA: Diagnosis not present

## 2023-03-09 DIAGNOSIS — D631 Anemia in chronic kidney disease: Secondary | ICD-10-CM | POA: Diagnosis not present

## 2023-03-09 DIAGNOSIS — D689 Coagulation defect, unspecified: Secondary | ICD-10-CM | POA: Diagnosis not present

## 2023-03-09 DIAGNOSIS — N186 End stage renal disease: Secondary | ICD-10-CM | POA: Diagnosis not present

## 2023-03-10 DIAGNOSIS — D689 Coagulation defect, unspecified: Secondary | ICD-10-CM | POA: Diagnosis not present

## 2023-03-10 DIAGNOSIS — N186 End stage renal disease: Secondary | ICD-10-CM | POA: Diagnosis not present

## 2023-03-10 DIAGNOSIS — D509 Iron deficiency anemia, unspecified: Secondary | ICD-10-CM | POA: Diagnosis not present

## 2023-03-10 DIAGNOSIS — D631 Anemia in chronic kidney disease: Secondary | ICD-10-CM | POA: Diagnosis not present

## 2023-03-10 DIAGNOSIS — Z992 Dependence on renal dialysis: Secondary | ICD-10-CM | POA: Diagnosis not present

## 2023-03-10 DIAGNOSIS — N2581 Secondary hyperparathyroidism of renal origin: Secondary | ICD-10-CM | POA: Diagnosis not present

## 2023-03-11 DIAGNOSIS — N186 End stage renal disease: Secondary | ICD-10-CM | POA: Diagnosis not present

## 2023-03-11 DIAGNOSIS — D689 Coagulation defect, unspecified: Secondary | ICD-10-CM | POA: Diagnosis not present

## 2023-03-11 DIAGNOSIS — Z992 Dependence on renal dialysis: Secondary | ICD-10-CM | POA: Diagnosis not present

## 2023-03-11 DIAGNOSIS — N2581 Secondary hyperparathyroidism of renal origin: Secondary | ICD-10-CM | POA: Diagnosis not present

## 2023-03-11 DIAGNOSIS — D509 Iron deficiency anemia, unspecified: Secondary | ICD-10-CM | POA: Diagnosis not present

## 2023-03-11 DIAGNOSIS — D631 Anemia in chronic kidney disease: Secondary | ICD-10-CM | POA: Diagnosis not present

## 2023-03-13 DIAGNOSIS — D631 Anemia in chronic kidney disease: Secondary | ICD-10-CM | POA: Diagnosis not present

## 2023-03-13 DIAGNOSIS — N186 End stage renal disease: Secondary | ICD-10-CM | POA: Diagnosis not present

## 2023-03-13 DIAGNOSIS — D689 Coagulation defect, unspecified: Secondary | ICD-10-CM | POA: Diagnosis not present

## 2023-03-13 DIAGNOSIS — N2581 Secondary hyperparathyroidism of renal origin: Secondary | ICD-10-CM | POA: Diagnosis not present

## 2023-03-13 DIAGNOSIS — D509 Iron deficiency anemia, unspecified: Secondary | ICD-10-CM | POA: Diagnosis not present

## 2023-03-13 DIAGNOSIS — Z992 Dependence on renal dialysis: Secondary | ICD-10-CM | POA: Diagnosis not present

## 2023-03-14 ENCOUNTER — Encounter (HOSPITAL_COMMUNITY): Payer: Self-pay

## 2023-03-16 DIAGNOSIS — N186 End stage renal disease: Secondary | ICD-10-CM | POA: Diagnosis not present

## 2023-03-16 DIAGNOSIS — D509 Iron deficiency anemia, unspecified: Secondary | ICD-10-CM | POA: Diagnosis not present

## 2023-03-16 DIAGNOSIS — D631 Anemia in chronic kidney disease: Secondary | ICD-10-CM | POA: Diagnosis not present

## 2023-03-16 DIAGNOSIS — Z992 Dependence on renal dialysis: Secondary | ICD-10-CM | POA: Diagnosis not present

## 2023-03-16 DIAGNOSIS — D689 Coagulation defect, unspecified: Secondary | ICD-10-CM | POA: Diagnosis not present

## 2023-03-16 DIAGNOSIS — N2581 Secondary hyperparathyroidism of renal origin: Secondary | ICD-10-CM | POA: Diagnosis not present

## 2023-03-17 ENCOUNTER — Ambulatory Visit (HOSPITAL_COMMUNITY): Payer: 59

## 2023-03-18 DIAGNOSIS — N2581 Secondary hyperparathyroidism of renal origin: Secondary | ICD-10-CM | POA: Diagnosis not present

## 2023-03-18 DIAGNOSIS — D509 Iron deficiency anemia, unspecified: Secondary | ICD-10-CM | POA: Diagnosis not present

## 2023-03-18 DIAGNOSIS — D631 Anemia in chronic kidney disease: Secondary | ICD-10-CM | POA: Diagnosis not present

## 2023-03-18 DIAGNOSIS — D689 Coagulation defect, unspecified: Secondary | ICD-10-CM | POA: Diagnosis not present

## 2023-03-18 DIAGNOSIS — Z992 Dependence on renal dialysis: Secondary | ICD-10-CM | POA: Diagnosis not present

## 2023-03-18 DIAGNOSIS — N186 End stage renal disease: Secondary | ICD-10-CM | POA: Diagnosis not present

## 2023-03-20 DIAGNOSIS — Z992 Dependence on renal dialysis: Secondary | ICD-10-CM | POA: Diagnosis not present

## 2023-03-20 DIAGNOSIS — D509 Iron deficiency anemia, unspecified: Secondary | ICD-10-CM | POA: Diagnosis not present

## 2023-03-20 DIAGNOSIS — N2581 Secondary hyperparathyroidism of renal origin: Secondary | ICD-10-CM | POA: Diagnosis not present

## 2023-03-20 DIAGNOSIS — D631 Anemia in chronic kidney disease: Secondary | ICD-10-CM | POA: Diagnosis not present

## 2023-03-20 DIAGNOSIS — D689 Coagulation defect, unspecified: Secondary | ICD-10-CM | POA: Diagnosis not present

## 2023-03-20 DIAGNOSIS — N186 End stage renal disease: Secondary | ICD-10-CM | POA: Diagnosis not present

## 2023-03-22 ENCOUNTER — Ambulatory Visit: Payer: Self-pay | Admitting: Radiation Oncology

## 2023-03-22 ENCOUNTER — Encounter (HOSPITAL_COMMUNITY): Payer: Self-pay

## 2023-03-23 DIAGNOSIS — D509 Iron deficiency anemia, unspecified: Secondary | ICD-10-CM | POA: Diagnosis not present

## 2023-03-23 DIAGNOSIS — Z992 Dependence on renal dialysis: Secondary | ICD-10-CM | POA: Diagnosis not present

## 2023-03-23 DIAGNOSIS — N186 End stage renal disease: Secondary | ICD-10-CM | POA: Diagnosis not present

## 2023-03-23 DIAGNOSIS — N2581 Secondary hyperparathyroidism of renal origin: Secondary | ICD-10-CM | POA: Diagnosis not present

## 2023-03-23 DIAGNOSIS — D689 Coagulation defect, unspecified: Secondary | ICD-10-CM | POA: Diagnosis not present

## 2023-03-23 DIAGNOSIS — D631 Anemia in chronic kidney disease: Secondary | ICD-10-CM | POA: Diagnosis not present

## 2023-03-24 ENCOUNTER — Ambulatory Visit (INDEPENDENT_AMBULATORY_CARE_PROVIDER_SITE_OTHER): Payer: 59 | Admitting: Nurse Practitioner

## 2023-03-24 ENCOUNTER — Encounter (INDEPENDENT_AMBULATORY_CARE_PROVIDER_SITE_OTHER): Payer: 59

## 2023-03-24 DIAGNOSIS — D631 Anemia in chronic kidney disease: Secondary | ICD-10-CM | POA: Diagnosis not present

## 2023-03-24 DIAGNOSIS — N2581 Secondary hyperparathyroidism of renal origin: Secondary | ICD-10-CM | POA: Diagnosis not present

## 2023-03-24 DIAGNOSIS — Z992 Dependence on renal dialysis: Secondary | ICD-10-CM | POA: Diagnosis not present

## 2023-03-24 DIAGNOSIS — D509 Iron deficiency anemia, unspecified: Secondary | ICD-10-CM | POA: Diagnosis not present

## 2023-03-24 DIAGNOSIS — D689 Coagulation defect, unspecified: Secondary | ICD-10-CM | POA: Diagnosis not present

## 2023-03-24 DIAGNOSIS — N186 End stage renal disease: Secondary | ICD-10-CM | POA: Diagnosis not present

## 2023-03-25 ENCOUNTER — Other Ambulatory Visit (INDEPENDENT_AMBULATORY_CARE_PROVIDER_SITE_OTHER): Payer: Self-pay | Admitting: Nurse Practitioner

## 2023-03-25 DIAGNOSIS — N186 End stage renal disease: Secondary | ICD-10-CM

## 2023-03-27 DIAGNOSIS — N186 End stage renal disease: Secondary | ICD-10-CM | POA: Diagnosis not present

## 2023-03-27 DIAGNOSIS — D631 Anemia in chronic kidney disease: Secondary | ICD-10-CM | POA: Diagnosis not present

## 2023-03-27 DIAGNOSIS — D689 Coagulation defect, unspecified: Secondary | ICD-10-CM | POA: Diagnosis not present

## 2023-03-27 DIAGNOSIS — Z992 Dependence on renal dialysis: Secondary | ICD-10-CM | POA: Diagnosis not present

## 2023-03-27 DIAGNOSIS — N2581 Secondary hyperparathyroidism of renal origin: Secondary | ICD-10-CM | POA: Diagnosis not present

## 2023-03-27 DIAGNOSIS — D509 Iron deficiency anemia, unspecified: Secondary | ICD-10-CM | POA: Diagnosis not present

## 2023-03-29 ENCOUNTER — Ambulatory Visit (INDEPENDENT_AMBULATORY_CARE_PROVIDER_SITE_OTHER): Payer: 59 | Admitting: Nurse Practitioner

## 2023-03-29 ENCOUNTER — Encounter (INDEPENDENT_AMBULATORY_CARE_PROVIDER_SITE_OTHER): Payer: 59

## 2023-03-30 DIAGNOSIS — D509 Iron deficiency anemia, unspecified: Secondary | ICD-10-CM | POA: Diagnosis not present

## 2023-03-30 DIAGNOSIS — D689 Coagulation defect, unspecified: Secondary | ICD-10-CM | POA: Diagnosis not present

## 2023-03-30 DIAGNOSIS — N2581 Secondary hyperparathyroidism of renal origin: Secondary | ICD-10-CM | POA: Diagnosis not present

## 2023-03-30 DIAGNOSIS — Z992 Dependence on renal dialysis: Secondary | ICD-10-CM | POA: Diagnosis not present

## 2023-03-30 DIAGNOSIS — N186 End stage renal disease: Secondary | ICD-10-CM | POA: Diagnosis not present

## 2023-03-30 DIAGNOSIS — D631 Anemia in chronic kidney disease: Secondary | ICD-10-CM | POA: Diagnosis not present

## 2023-04-01 DIAGNOSIS — Z992 Dependence on renal dialysis: Secondary | ICD-10-CM | POA: Diagnosis not present

## 2023-04-01 DIAGNOSIS — D509 Iron deficiency anemia, unspecified: Secondary | ICD-10-CM | POA: Diagnosis not present

## 2023-04-01 DIAGNOSIS — D689 Coagulation defect, unspecified: Secondary | ICD-10-CM | POA: Diagnosis not present

## 2023-04-01 DIAGNOSIS — N186 End stage renal disease: Secondary | ICD-10-CM | POA: Diagnosis not present

## 2023-04-01 DIAGNOSIS — D631 Anemia in chronic kidney disease: Secondary | ICD-10-CM | POA: Diagnosis not present

## 2023-04-01 DIAGNOSIS — N2581 Secondary hyperparathyroidism of renal origin: Secondary | ICD-10-CM | POA: Diagnosis not present

## 2023-04-02 DIAGNOSIS — I129 Hypertensive chronic kidney disease with stage 1 through stage 4 chronic kidney disease, or unspecified chronic kidney disease: Secondary | ICD-10-CM | POA: Diagnosis not present

## 2023-04-02 DIAGNOSIS — N186 End stage renal disease: Secondary | ICD-10-CM | POA: Diagnosis not present

## 2023-04-02 DIAGNOSIS — Z992 Dependence on renal dialysis: Secondary | ICD-10-CM | POA: Diagnosis not present

## 2023-04-03 DIAGNOSIS — D689 Coagulation defect, unspecified: Secondary | ICD-10-CM | POA: Diagnosis not present

## 2023-04-03 DIAGNOSIS — Z992 Dependence on renal dialysis: Secondary | ICD-10-CM | POA: Diagnosis not present

## 2023-04-03 DIAGNOSIS — D631 Anemia in chronic kidney disease: Secondary | ICD-10-CM | POA: Diagnosis not present

## 2023-04-03 DIAGNOSIS — N2581 Secondary hyperparathyroidism of renal origin: Secondary | ICD-10-CM | POA: Diagnosis not present

## 2023-04-03 DIAGNOSIS — N186 End stage renal disease: Secondary | ICD-10-CM | POA: Diagnosis not present

## 2023-04-03 DIAGNOSIS — D509 Iron deficiency anemia, unspecified: Secondary | ICD-10-CM | POA: Diagnosis not present

## 2023-04-06 DIAGNOSIS — N2581 Secondary hyperparathyroidism of renal origin: Secondary | ICD-10-CM | POA: Diagnosis not present

## 2023-04-06 DIAGNOSIS — D509 Iron deficiency anemia, unspecified: Secondary | ICD-10-CM | POA: Diagnosis not present

## 2023-04-06 DIAGNOSIS — Z992 Dependence on renal dialysis: Secondary | ICD-10-CM | POA: Diagnosis not present

## 2023-04-06 DIAGNOSIS — D689 Coagulation defect, unspecified: Secondary | ICD-10-CM | POA: Diagnosis not present

## 2023-04-06 DIAGNOSIS — N186 End stage renal disease: Secondary | ICD-10-CM | POA: Diagnosis not present

## 2023-04-06 DIAGNOSIS — D631 Anemia in chronic kidney disease: Secondary | ICD-10-CM | POA: Diagnosis not present

## 2023-04-08 DIAGNOSIS — N2581 Secondary hyperparathyroidism of renal origin: Secondary | ICD-10-CM | POA: Diagnosis not present

## 2023-04-08 DIAGNOSIS — D631 Anemia in chronic kidney disease: Secondary | ICD-10-CM | POA: Diagnosis not present

## 2023-04-08 DIAGNOSIS — Z992 Dependence on renal dialysis: Secondary | ICD-10-CM | POA: Diagnosis not present

## 2023-04-08 DIAGNOSIS — D509 Iron deficiency anemia, unspecified: Secondary | ICD-10-CM | POA: Diagnosis not present

## 2023-04-08 DIAGNOSIS — D689 Coagulation defect, unspecified: Secondary | ICD-10-CM | POA: Diagnosis not present

## 2023-04-08 DIAGNOSIS — N186 End stage renal disease: Secondary | ICD-10-CM | POA: Diagnosis not present

## 2023-04-10 DIAGNOSIS — N2581 Secondary hyperparathyroidism of renal origin: Secondary | ICD-10-CM | POA: Diagnosis not present

## 2023-04-10 DIAGNOSIS — D631 Anemia in chronic kidney disease: Secondary | ICD-10-CM | POA: Diagnosis not present

## 2023-04-10 DIAGNOSIS — D689 Coagulation defect, unspecified: Secondary | ICD-10-CM | POA: Diagnosis not present

## 2023-04-10 DIAGNOSIS — D509 Iron deficiency anemia, unspecified: Secondary | ICD-10-CM | POA: Diagnosis not present

## 2023-04-10 DIAGNOSIS — Z992 Dependence on renal dialysis: Secondary | ICD-10-CM | POA: Diagnosis not present

## 2023-04-10 DIAGNOSIS — N186 End stage renal disease: Secondary | ICD-10-CM | POA: Diagnosis not present

## 2023-04-13 DIAGNOSIS — D509 Iron deficiency anemia, unspecified: Secondary | ICD-10-CM | POA: Diagnosis not present

## 2023-04-13 DIAGNOSIS — D689 Coagulation defect, unspecified: Secondary | ICD-10-CM | POA: Diagnosis not present

## 2023-04-13 DIAGNOSIS — D631 Anemia in chronic kidney disease: Secondary | ICD-10-CM | POA: Diagnosis not present

## 2023-04-13 DIAGNOSIS — N2581 Secondary hyperparathyroidism of renal origin: Secondary | ICD-10-CM | POA: Diagnosis not present

## 2023-04-13 DIAGNOSIS — N186 End stage renal disease: Secondary | ICD-10-CM | POA: Diagnosis not present

## 2023-04-13 DIAGNOSIS — Z992 Dependence on renal dialysis: Secondary | ICD-10-CM | POA: Diagnosis not present

## 2023-04-15 DIAGNOSIS — N186 End stage renal disease: Secondary | ICD-10-CM | POA: Diagnosis not present

## 2023-04-15 DIAGNOSIS — Z992 Dependence on renal dialysis: Secondary | ICD-10-CM | POA: Diagnosis not present

## 2023-04-15 DIAGNOSIS — D689 Coagulation defect, unspecified: Secondary | ICD-10-CM | POA: Diagnosis not present

## 2023-04-15 DIAGNOSIS — N2581 Secondary hyperparathyroidism of renal origin: Secondary | ICD-10-CM | POA: Diagnosis not present

## 2023-04-15 DIAGNOSIS — D631 Anemia in chronic kidney disease: Secondary | ICD-10-CM | POA: Diagnosis not present

## 2023-04-15 DIAGNOSIS — D509 Iron deficiency anemia, unspecified: Secondary | ICD-10-CM | POA: Diagnosis not present

## 2023-04-17 DIAGNOSIS — N186 End stage renal disease: Secondary | ICD-10-CM | POA: Diagnosis not present

## 2023-04-17 DIAGNOSIS — Z992 Dependence on renal dialysis: Secondary | ICD-10-CM | POA: Diagnosis not present

## 2023-04-17 DIAGNOSIS — D509 Iron deficiency anemia, unspecified: Secondary | ICD-10-CM | POA: Diagnosis not present

## 2023-04-17 DIAGNOSIS — D689 Coagulation defect, unspecified: Secondary | ICD-10-CM | POA: Diagnosis not present

## 2023-04-17 DIAGNOSIS — D631 Anemia in chronic kidney disease: Secondary | ICD-10-CM | POA: Diagnosis not present

## 2023-04-17 DIAGNOSIS — N2581 Secondary hyperparathyroidism of renal origin: Secondary | ICD-10-CM | POA: Diagnosis not present

## 2023-04-19 ENCOUNTER — Encounter (HOSPITAL_COMMUNITY): Payer: Self-pay

## 2023-04-20 DIAGNOSIS — D509 Iron deficiency anemia, unspecified: Secondary | ICD-10-CM | POA: Diagnosis not present

## 2023-04-20 DIAGNOSIS — N2581 Secondary hyperparathyroidism of renal origin: Secondary | ICD-10-CM | POA: Diagnosis not present

## 2023-04-20 DIAGNOSIS — D631 Anemia in chronic kidney disease: Secondary | ICD-10-CM | POA: Diagnosis not present

## 2023-04-20 DIAGNOSIS — Z992 Dependence on renal dialysis: Secondary | ICD-10-CM | POA: Diagnosis not present

## 2023-04-20 DIAGNOSIS — N186 End stage renal disease: Secondary | ICD-10-CM | POA: Diagnosis not present

## 2023-04-20 DIAGNOSIS — D689 Coagulation defect, unspecified: Secondary | ICD-10-CM | POA: Diagnosis not present

## 2023-04-21 ENCOUNTER — Ambulatory Visit (INDEPENDENT_AMBULATORY_CARE_PROVIDER_SITE_OTHER)

## 2023-04-21 VITALS — Ht 65.0 in | Wt 134.0 lb

## 2023-04-21 DIAGNOSIS — Z0000 Encounter for general adult medical examination without abnormal findings: Secondary | ICD-10-CM

## 2023-04-21 NOTE — Progress Notes (Signed)
 Please attest and cosign this visit due to patients primary care provider not being in the office at the time the visit was completed.    Subjective:   Kimberly Frost is a 73 y.o. who presents for a Medicare Wellness preventive visit.  Visit Complete: Virtual I connected with  Kimberly Frost on 04/21/23 by a audio enabled telemedicine application and verified that I am speaking with the correct person using two identifiers.  Patient Location: Home  Provider Location: Office/Clinic  I discussed the limitations of evaluation and management by telemedicine. The patient expressed understanding and agreed to proceed.  Vital Signs: Because this visit was a virtual/telehealth visit, some criteria may be missing or patient reported. Any vitals not documented were not able to be obtained and vitals that have been documented are patient reported.  VideoDeclined- This patient declined Librarian, academic. Therefore the visit was completed with audio only.  Persons Participating in Visit: Patient.  AWV Questionnaire: No: Patient Medicare AWV questionnaire was not completed prior to this visit.  Cardiac Risk Factors include: advanced age (>22men, >42 women);hypertension;dyslipidemia;sedentary lifestyle     Objective:    Today's Vitals   04/21/23 1014  Weight: 134 lb (60.8 kg)  Height: 5\' 5"  (1.651 m)   Body mass index is 22.3 kg/m.     04/21/2023   10:28 AM 01/04/2023    3:23 PM 09/14/2022    9:52 AM 04/27/2022    1:39 PM 04/06/2022   10:09 AM 03/16/2022   10:06 AM 11/19/2021    8:37 AM  Advanced Directives  Does Patient Have a Medical Advance Directive? No No No No No No No  Would patient like information on creating a medical advance directive?  No - Patient declined No - Patient declined  No - Patient declined  No - Patient declined    Current Medications (verified) Outpatient Encounter Medications as of 04/21/2023  Medication Sig   acetaminophen  (TYLENOL) 500 MG tablet Take 1,000 mg by mouth every 6 (six) hours as needed for moderate pain or headache.   atorvastatin (LIPITOR) 80 MG tablet Take 1 tablet (80 mg total) by mouth daily.   busPIRone (BUSPAR) 5 MG tablet TAKE 1/2 TABLET BY MOUTH TWICE DAILY   cyclobenzaprine (FLEXERIL) 5 MG tablet Take 5 mg by mouth 2 (two) times daily as needed.   gabapentin (NEURONTIN) 100 MG capsule Take 100-200 mg by mouth at bedtime.   midodrine (PROAMATINE) 10 MG tablet Take 5 mg by mouth See admin instructions. Take 1 tablet (5mg ) by mouth 30 minutes before dialysis and take 1 tablet (5mg ) by mouth during dialysis if needed for BP support   multivitamin (RENA-VIT) TABS tablet Take 1 tablet by mouth daily.   sertraline (ZOLOFT) 25 MG tablet TAKE 1 TABLET (25 MG TOTAL) BY MOUTH DAILY.   sucroferric oxyhydroxide (VELPHORO) 500 MG chewable tablet Chew 500-1,000 mg by mouth See admin instructions. Take 2 tablets (1000mg ) by mouth three times daily with meals and take 1 tablet (500mg ) by mouth with snacks   No facility-administered encounter medications on file as of 04/21/2023.    Allergies (verified) Patient has no known allergies.   History: Past Medical History:  Diagnosis Date   Chronic kidney disease    Edema    RLE   Hepatitis    Hep C   History of blood transfusion    History of radiation therapy    right lung 02/24/2021-03/10/2021  Dr Antony Blackbird   Hypertension  Lung cancer [facility])    Wears dentures    Past Surgical History:  Procedure Laterality Date   A/V FISTULAGRAM Left 04/11/2019   Procedure: A/V FISTULAGRAM;  Surgeon: Renford Dills, MD;  Location: [facility] INVASIVE CV LAB;  Service: Cardiovascular;  Laterality: Left;   A/V FISTULAGRAM Left 05/01/2019   Procedure: A/V FISTULAGRAM;  Surgeon: Annice Needy, MD;  Location: [facility];  Service: Cardiovascular;  Laterality: Left;   A/V FISTULAGRAM Left 01/15/2020   Procedure: A/V FISTULAGRAM;  Surgeon: Annice Needy, MD;   Location: [facility];  Service: Cardiovascular;  Laterality: Left;   A/V FISTULAGRAM Left 04/22/2020   Procedure: A/V FISTULAGRAM;  Surgeon: Annice Needy, MD;  Location: [facility];  Service: Cardiovascular;  Laterality: Left;   A/V FISTULAGRAM Left 06/25/2021   Procedure: A/V Fistulagram;  Surgeon: Renford Dills, MD;  Location: [facility];  Service: Cardiovascular;  Laterality: Left;   A/V FISTULAGRAM Left 04/27/2022   Procedure: A/V Fistulagram;  Surgeon: Annice Needy, MD;  Location: [facility];  Service: Cardiovascular;  Laterality: Left;   AV FISTULA PLACEMENT Left 06/16/2018   Procedure: Left Arm ARTERIOVENOUS (AV) FISTULA CREATION;  Surgeon: Cephus Shelling, MD;  Location: [facility] OR;  Service: Vascular;  Laterality: Left;   MULTIPLE TOOTH EXTRACTIONS     MYOMECTOMY     Family History  Problem Relation Age of Onset   Diabetes Mother    Hypertension Mother    Breast cancer Mother 59   Bone cancer Father    Stroke Brother    Diabetes Brother    Drug abuse Brother    Breast cancer Maternal Aunt 50   Breast cancer Maternal Aunt 68   Kidney cancer Maternal Grandmother    Social History   Socioeconomic History   Marital status: Significant Other    Spouse name: Not on file   Number of children: 0   Years of education: Not on file   Highest education level: Not on file  Occupational History   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   Tobacco Use   Smoking status: Former    Current packs/day: 0.00    Types: Cigarettes    Quit date: 1990    Years since quitting: 35.2   Smokeless tobacco: Never  Vaping Use   Vaping status: Never Used  Substance and Sexual Activity   Alcohol use: Not Currently    Alcohol/week: 6.0 standard drinks of alcohol    Types: 6 Cans of beer per week    Comment: quit 3-4 years ago   Drug use: Not Currently    Types: Marijuana    Comment: Years ago   Sexual activity: Not Currently  Other Topics Concern   Not on  file  Social History Narrative   Lives at home in private residence with fiance' Joe    Social Drivers of Health   Financial Resource Strain: Low Risk  (04/21/2023)   Overall Financial Resource Strain (CARDIA)    Difficulty of Paying Living Expenses: Not hard at all  Food Insecurity: No Food Insecurity (04/21/2023)   Hunger Vital Sign    Worried About Running Out of Food in the Last Year: Never true    Ran Out of Food in the Last Year: Never true  Transportation Needs: No Transportation Needs (04/21/2023)   PRAPARE - Administrator, Civil Service (Medical): No    Lack of Transportation (Non-Medical): No  Physical Activity: Inactive (04/21/2023)  Exercise Vital Sign    Days of Exercise per Week: 0 days    Minutes of Exercise per Session: 0 min  Stress: Stress Concern Present (04/21/2023)   [employer] of Occupational Health - Occupational Stress Questionnaire    Feeling of Stress : To some extent  Social Connections: Moderately Integrated (04/21/2023)   Social Connection and Isolation Panel [NHANES]    Frequency of Communication with Friends and Family: More than three times a week    Frequency of Social Gatherings with Friends and Family: More than three times a week    Attends Religious Services: More than 4 times per year    Active Member of [employer] or Organizations: No    Attends Engineer, structural: Never    Marital Status: Living with partner    Tobacco Counseling Counseling given: Not Answered   Clinical Intake:  Pre-visit preparation completed: Yes  Pain : No/denies pain    BMI - recorded: 22.3 Nutritional Status: BMI 25 -29 Overweight Nutritional Risks: None Diabetes: No  Lab Results  Component Value Date   HGBA1C 5.2 08/04/2021   HGBA1C 5.5 11/27/2019     How often do you need to have someone help you when you read instructions, pamphlets, or other written materials from your doctor or pharmacy?: 1 - Never  Interpreter Needed?:  No  Comments: lives with fiancee Information entered by :: B.Kanesha Cadle,LPN   Activities of Daily Living     04/21/2023   10:29 AM 04/27/2022    1:36 PM  In your present state of health, do you have any difficulty performing the following activities:  Hearing? 0 0  Vision? 0 0  Difficulty concentrating or making decisions? 1 0  Walking or climbing stairs? 1 0  Dressing or bathing? 0 0  Doing errands, shopping? 0   Preparing Food and eating ? N   Using the Toilet? N   In the past six months, have you accidently leaked urine? N   Do you have problems with loss of bowel control? N   Managing your Medications? N   Managing your Finances? N   Housekeeping or managing your Housekeeping? N     Patient Care Team: Mort Sawyers, FNP as PCP - General (Family Medicine)  Indicate any recent Medical Services you may have received from other than [facility] providers in the past year (date may be approximate).     Assessment:   This is a routine wellness examination for Kimberly Frost.  Hearing/Vision screen Hearing Screening - Comments:: Pt says she hears good Vision Screening - Comments:: Pt says her vision is good w/glasses;readers sometimes    Goals Addressed               This Visit's Progress     Patient Stated (pt-stated)        04/21/23-Get in better health; pt says doing better at dialysis and wants to keep good reports       Depression Screen     04/21/2023   10:24 AM 01/20/2023    1:30 PM 12/14/2022   12:48 PM 04/24/2022    7:29 AM 11/19/2021    8:30 AM 07/31/2019   10:55 AM 11/15/2014    9:52 AM  PHQ 2/9 Scores  PHQ - 2 Score 2 3 4 2 2  0 0  PHQ- 9 Score 3 15 10  5       Fall Risk     04/21/2023   10:17 AM 01/20/2023    1:29 PM 01/20/2023  1:03 PM 12/14/2022   12:47 PM 04/24/2022    7:28 AM  Fall Risk   Falls in the past year? 0 0 0 0 0  Number falls in past yr: 0 0 0 0 0  Injury with Fall? 0 0 0 0 0  Risk for fall due to : No Fall Risks No Fall Risks No Fall  Risks No Fall Risks   Follow up Education provided;Falls prevention discussed Falls evaluation completed Falls evaluation completed Falls evaluation completed Falls evaluation completed;Education provided;Falls prevention discussed    MEDICARE RISK AT HOME:  Medicare Risk at Home Any stairs in or around the home?: Yes (outside home) If so, are there any without handrails?: No Home free of loose throw rugs in walkways, pet beds, electrical cords, etc?: Yes Adequate lighting in your home to reduce risk of falls?: Yes Life alert?: No Use of a cane, walker or w/c?: Yes (sometimes) Grab bars in the bathroom?: Yes Shower chair or bench in shower?: Yes Elevated toilet seat or a handicapped toilet?: No  TIMED UP AND GO:  Was the test performed?  No  Cognitive Function: 6CIT completed        04/21/2023   10:31 AM 11/19/2021    8:39 AM  6CIT Screen  What Year? 0 points 0 points  What month? 0 points 3 points  What time? 0 points 0 points  Count back from 20 0 points 0 points  Months in reverse 4 points 4 points  Repeat phrase 0 points 4 points  Total Score 4 points 11 points    Immunizations Immunization History  Administered Date(s) Administered   Hepatitis B, ADULT 10/06/2018, 11/15/2018, 12/08/2018, 03/11/2019   Hepb-cpg 07/18/2019, 08/10/2019, 09/07/2019   Influenza, High Dose Seasonal PF 11/23/2020   Influenza, Quadrivalent, Recombinant, Inj, Pf 12/13/2021   Influenza,inj,Quad PF,6+ Mos 01/27/2013, 02/27/2018, 11/01/2018   Influenza,trivalent, recombinat, inj, PF 12/01/2022   Influenza-Unspecified 11/09/2019   Moderna Sars-Covid-2 Vaccination 04/06/2019, 05/04/2019, 11/21/2019   PNEUMOCOCCAL CONJUGATE-20 06/30/2022   Pneumococcal Conjugate-13 07/31/2019    Screening Tests Health Maintenance  Topic Date Due   DTaP/Tdap/Td (1 - Tdap) Never done   Zoster Vaccines- Shingrix (1 of 2) Never done   COVID-19 Vaccine (4 - 2024-25 season) 10/04/2022   MAMMOGRAM  10/04/2023    Medicare Annual Wellness (AWV)  04/20/2024   Colonoscopy  11/06/2029   Pneumonia Vaccine 78+ Years old  Completed   INFLUENZA VACCINE  Completed   DEXA SCAN  Completed   Hepatitis C Screening  Completed   HPV VACCINES  Aged Out    Health Maintenance  Health Maintenance Due  Topic Date Due   DTaP/Tdap/Td (1 - Tdap) Never done   Zoster Vaccines- Shingrix (1 of 2) Never done   COVID-19 Vaccine (4 - 2024-25 season) 10/04/2022   Health Maintenance Items Addressed: Pt will schedule for Ophthalmology appt (pt due)   Additional Screening:  Vision Screening: Recommended annual ophthalmology exams for early detection of glaucoma and other disorders of the eye.  Dental Screening: Recommended annual dental exams for proper oral hygiene  Community Resource Referral / Chronic Care Management: CRR required this visit?  No   CCM required this visit?  Appt scheduled with PCP    Plan:     I have personally reviewed and noted the following in the patient's chart:   Medical and social history Use of alcohol, tobacco or illicit drugs  Current medications and supplements including opioid prescriptions. Patient is not currently taking opioid prescriptions. Functional  ability and status Nutritional status Physical activity Advanced directives List of other physicians Hospitalizations, surgeries, and ER visits in previous 12 months Vitals Screenings to include cognitive, depression, and falls Referrals and appointments  In addition, I have reviewed and discussed with patient certain preventive protocols, quality metrics, and best practice recommendations. A written personalized care plan for preventive services as well as general preventive health recommendations were provided to patient.    Sue Lush, LPN   1/61/0960   After Visit Summary: (Declined) Due to this being a telephonic visit, with patients personalized plan was offered to patient but patient Declined AVS at this  time   Notes: Nothing significant to report at this time.

## 2023-04-21 NOTE — Patient Instructions (Signed)
 Ms. Steffek , Thank you for taking time to come for your Medicare Wellness Visit. I appreciate your ongoing commitment to your health goals. Please review the following plan we discussed and let me know if I can assist you in the future.   Referrals/Orders/Follow-Ups/Clinician Recommendations: none  This is a list of the screening recommended for you and due dates:  Health Maintenance  Topic Date Due   DTaP/Tdap/Td vaccine (1 - Tdap) Never done   Zoster (Shingles) Vaccine (1 of 2) Never done   COVID-19 Vaccine (4 - 2024-25 season) 10/04/2022   Mammogram  10/04/2023   Medicare Annual Wellness Visit  04/20/2024   Colon Cancer Screening  11/06/2029   Pneumonia Vaccine  Completed   Flu Shot  Completed   DEXA scan (bone density measurement)  Completed   Hepatitis C Screening  Completed   HPV Vaccine  Aged Out    Advanced directives: (Declined) Advance directive discussed with you today. Even though you declined this today, please call our office should you change your mind, and we can give you the proper paperwork for you to fill out.  Next Medicare Annual Wellness Visit scheduled for next year: Yes 04/21/2024 @ 10:10am televisit

## 2023-04-22 DIAGNOSIS — D631 Anemia in chronic kidney disease: Secondary | ICD-10-CM | POA: Diagnosis not present

## 2023-04-22 DIAGNOSIS — N186 End stage renal disease: Secondary | ICD-10-CM | POA: Diagnosis not present

## 2023-04-22 DIAGNOSIS — D689 Coagulation defect, unspecified: Secondary | ICD-10-CM | POA: Diagnosis not present

## 2023-04-22 DIAGNOSIS — D509 Iron deficiency anemia, unspecified: Secondary | ICD-10-CM | POA: Diagnosis not present

## 2023-04-22 DIAGNOSIS — N2581 Secondary hyperparathyroidism of renal origin: Secondary | ICD-10-CM | POA: Diagnosis not present

## 2023-04-22 DIAGNOSIS — Z992 Dependence on renal dialysis: Secondary | ICD-10-CM | POA: Diagnosis not present

## 2023-04-24 DIAGNOSIS — N2581 Secondary hyperparathyroidism of renal origin: Secondary | ICD-10-CM | POA: Diagnosis not present

## 2023-04-24 DIAGNOSIS — Z992 Dependence on renal dialysis: Secondary | ICD-10-CM | POA: Diagnosis not present

## 2023-04-24 DIAGNOSIS — D689 Coagulation defect, unspecified: Secondary | ICD-10-CM | POA: Diagnosis not present

## 2023-04-24 DIAGNOSIS — N186 End stage renal disease: Secondary | ICD-10-CM | POA: Diagnosis not present

## 2023-04-24 DIAGNOSIS — D631 Anemia in chronic kidney disease: Secondary | ICD-10-CM | POA: Diagnosis not present

## 2023-04-24 DIAGNOSIS — D509 Iron deficiency anemia, unspecified: Secondary | ICD-10-CM | POA: Diagnosis not present

## 2023-04-27 DIAGNOSIS — D631 Anemia in chronic kidney disease: Secondary | ICD-10-CM | POA: Diagnosis not present

## 2023-04-27 DIAGNOSIS — D509 Iron deficiency anemia, unspecified: Secondary | ICD-10-CM | POA: Diagnosis not present

## 2023-04-27 DIAGNOSIS — N186 End stage renal disease: Secondary | ICD-10-CM | POA: Diagnosis not present

## 2023-04-27 DIAGNOSIS — Z992 Dependence on renal dialysis: Secondary | ICD-10-CM | POA: Diagnosis not present

## 2023-04-27 DIAGNOSIS — N2581 Secondary hyperparathyroidism of renal origin: Secondary | ICD-10-CM | POA: Diagnosis not present

## 2023-04-27 DIAGNOSIS — D689 Coagulation defect, unspecified: Secondary | ICD-10-CM | POA: Diagnosis not present

## 2023-04-29 DIAGNOSIS — D509 Iron deficiency anemia, unspecified: Secondary | ICD-10-CM | POA: Diagnosis not present

## 2023-04-29 DIAGNOSIS — N186 End stage renal disease: Secondary | ICD-10-CM | POA: Diagnosis not present

## 2023-04-29 DIAGNOSIS — D631 Anemia in chronic kidney disease: Secondary | ICD-10-CM | POA: Diagnosis not present

## 2023-04-29 DIAGNOSIS — Z992 Dependence on renal dialysis: Secondary | ICD-10-CM | POA: Diagnosis not present

## 2023-04-29 DIAGNOSIS — N2581 Secondary hyperparathyroidism of renal origin: Secondary | ICD-10-CM | POA: Diagnosis not present

## 2023-04-29 DIAGNOSIS — D689 Coagulation defect, unspecified: Secondary | ICD-10-CM | POA: Diagnosis not present

## 2023-05-01 DIAGNOSIS — Z992 Dependence on renal dialysis: Secondary | ICD-10-CM | POA: Diagnosis not present

## 2023-05-01 DIAGNOSIS — D689 Coagulation defect, unspecified: Secondary | ICD-10-CM | POA: Diagnosis not present

## 2023-05-01 DIAGNOSIS — N186 End stage renal disease: Secondary | ICD-10-CM | POA: Diagnosis not present

## 2023-05-01 DIAGNOSIS — N2581 Secondary hyperparathyroidism of renal origin: Secondary | ICD-10-CM | POA: Diagnosis not present

## 2023-05-01 DIAGNOSIS — D631 Anemia in chronic kidney disease: Secondary | ICD-10-CM | POA: Diagnosis not present

## 2023-05-01 DIAGNOSIS — D509 Iron deficiency anemia, unspecified: Secondary | ICD-10-CM | POA: Diagnosis not present

## 2023-05-03 DIAGNOSIS — N186 End stage renal disease: Secondary | ICD-10-CM | POA: Diagnosis not present

## 2023-05-03 DIAGNOSIS — I129 Hypertensive chronic kidney disease with stage 1 through stage 4 chronic kidney disease, or unspecified chronic kidney disease: Secondary | ICD-10-CM | POA: Diagnosis not present

## 2023-05-03 DIAGNOSIS — Z992 Dependence on renal dialysis: Secondary | ICD-10-CM | POA: Diagnosis not present

## 2023-05-04 DIAGNOSIS — Z992 Dependence on renal dialysis: Secondary | ICD-10-CM | POA: Diagnosis not present

## 2023-05-04 DIAGNOSIS — N186 End stage renal disease: Secondary | ICD-10-CM | POA: Diagnosis not present

## 2023-05-04 DIAGNOSIS — D689 Coagulation defect, unspecified: Secondary | ICD-10-CM | POA: Diagnosis not present

## 2023-05-04 DIAGNOSIS — N2581 Secondary hyperparathyroidism of renal origin: Secondary | ICD-10-CM | POA: Diagnosis not present

## 2023-05-04 DIAGNOSIS — D509 Iron deficiency anemia, unspecified: Secondary | ICD-10-CM | POA: Diagnosis not present

## 2023-05-04 DIAGNOSIS — D631 Anemia in chronic kidney disease: Secondary | ICD-10-CM | POA: Diagnosis not present

## 2023-05-05 ENCOUNTER — Ambulatory Visit (INDEPENDENT_AMBULATORY_CARE_PROVIDER_SITE_OTHER): Payer: 59 | Admitting: Nurse Practitioner

## 2023-05-05 ENCOUNTER — Encounter (INDEPENDENT_AMBULATORY_CARE_PROVIDER_SITE_OTHER): Payer: 59

## 2023-05-06 DIAGNOSIS — D509 Iron deficiency anemia, unspecified: Secondary | ICD-10-CM | POA: Diagnosis not present

## 2023-05-06 DIAGNOSIS — N2581 Secondary hyperparathyroidism of renal origin: Secondary | ICD-10-CM | POA: Diagnosis not present

## 2023-05-06 DIAGNOSIS — N186 End stage renal disease: Secondary | ICD-10-CM | POA: Diagnosis not present

## 2023-05-06 DIAGNOSIS — D631 Anemia in chronic kidney disease: Secondary | ICD-10-CM | POA: Diagnosis not present

## 2023-05-06 DIAGNOSIS — Z992 Dependence on renal dialysis: Secondary | ICD-10-CM | POA: Diagnosis not present

## 2023-05-06 DIAGNOSIS — D689 Coagulation defect, unspecified: Secondary | ICD-10-CM | POA: Diagnosis not present

## 2023-05-08 DIAGNOSIS — Z992 Dependence on renal dialysis: Secondary | ICD-10-CM | POA: Diagnosis not present

## 2023-05-08 DIAGNOSIS — N186 End stage renal disease: Secondary | ICD-10-CM | POA: Diagnosis not present

## 2023-05-08 DIAGNOSIS — D509 Iron deficiency anemia, unspecified: Secondary | ICD-10-CM | POA: Diagnosis not present

## 2023-05-08 DIAGNOSIS — N2581 Secondary hyperparathyroidism of renal origin: Secondary | ICD-10-CM | POA: Diagnosis not present

## 2023-05-08 DIAGNOSIS — D689 Coagulation defect, unspecified: Secondary | ICD-10-CM | POA: Diagnosis not present

## 2023-05-08 DIAGNOSIS — D631 Anemia in chronic kidney disease: Secondary | ICD-10-CM | POA: Diagnosis not present

## 2023-05-11 DIAGNOSIS — D631 Anemia in chronic kidney disease: Secondary | ICD-10-CM | POA: Diagnosis not present

## 2023-05-11 DIAGNOSIS — N2581 Secondary hyperparathyroidism of renal origin: Secondary | ICD-10-CM | POA: Diagnosis not present

## 2023-05-11 DIAGNOSIS — D509 Iron deficiency anemia, unspecified: Secondary | ICD-10-CM | POA: Diagnosis not present

## 2023-05-11 DIAGNOSIS — Z992 Dependence on renal dialysis: Secondary | ICD-10-CM | POA: Diagnosis not present

## 2023-05-11 DIAGNOSIS — D689 Coagulation defect, unspecified: Secondary | ICD-10-CM | POA: Diagnosis not present

## 2023-05-11 DIAGNOSIS — N186 End stage renal disease: Secondary | ICD-10-CM | POA: Diagnosis not present

## 2023-05-12 ENCOUNTER — Telehealth: Payer: Self-pay

## 2023-05-12 NOTE — Telephone Encounter (Signed)
 Patient rescheduled.

## 2023-05-12 NOTE — Telephone Encounter (Signed)
 Please call and reschedule pt's physical appointment. She is scheduled to see Dr. Ermalene Searing tomorrow 05/13/23. Should be with PCP Mort Sawyers, NP.

## 2023-05-13 ENCOUNTER — Encounter: Admitting: Family Medicine

## 2023-05-13 DIAGNOSIS — D509 Iron deficiency anemia, unspecified: Secondary | ICD-10-CM | POA: Diagnosis not present

## 2023-05-13 DIAGNOSIS — D631 Anemia in chronic kidney disease: Secondary | ICD-10-CM | POA: Diagnosis not present

## 2023-05-13 DIAGNOSIS — N186 End stage renal disease: Secondary | ICD-10-CM | POA: Diagnosis not present

## 2023-05-13 DIAGNOSIS — N2581 Secondary hyperparathyroidism of renal origin: Secondary | ICD-10-CM | POA: Diagnosis not present

## 2023-05-13 DIAGNOSIS — Z992 Dependence on renal dialysis: Secondary | ICD-10-CM | POA: Diagnosis not present

## 2023-05-13 DIAGNOSIS — D689 Coagulation defect, unspecified: Secondary | ICD-10-CM | POA: Diagnosis not present

## 2023-05-14 DIAGNOSIS — D509 Iron deficiency anemia, unspecified: Secondary | ICD-10-CM | POA: Diagnosis not present

## 2023-05-14 DIAGNOSIS — D689 Coagulation defect, unspecified: Secondary | ICD-10-CM | POA: Diagnosis not present

## 2023-05-14 DIAGNOSIS — D631 Anemia in chronic kidney disease: Secondary | ICD-10-CM | POA: Diagnosis not present

## 2023-05-14 DIAGNOSIS — N2581 Secondary hyperparathyroidism of renal origin: Secondary | ICD-10-CM | POA: Diagnosis not present

## 2023-05-14 DIAGNOSIS — Z992 Dependence on renal dialysis: Secondary | ICD-10-CM | POA: Diagnosis not present

## 2023-05-14 DIAGNOSIS — N186 End stage renal disease: Secondary | ICD-10-CM | POA: Diagnosis not present

## 2023-05-15 DIAGNOSIS — N2581 Secondary hyperparathyroidism of renal origin: Secondary | ICD-10-CM | POA: Diagnosis not present

## 2023-05-15 DIAGNOSIS — Z992 Dependence on renal dialysis: Secondary | ICD-10-CM | POA: Diagnosis not present

## 2023-05-15 DIAGNOSIS — D509 Iron deficiency anemia, unspecified: Secondary | ICD-10-CM | POA: Diagnosis not present

## 2023-05-15 DIAGNOSIS — D631 Anemia in chronic kidney disease: Secondary | ICD-10-CM | POA: Diagnosis not present

## 2023-05-15 DIAGNOSIS — D689 Coagulation defect, unspecified: Secondary | ICD-10-CM | POA: Diagnosis not present

## 2023-05-15 DIAGNOSIS — N186 End stage renal disease: Secondary | ICD-10-CM | POA: Diagnosis not present

## 2023-05-18 DIAGNOSIS — N186 End stage renal disease: Secondary | ICD-10-CM | POA: Diagnosis not present

## 2023-05-18 DIAGNOSIS — D689 Coagulation defect, unspecified: Secondary | ICD-10-CM | POA: Diagnosis not present

## 2023-05-18 DIAGNOSIS — D631 Anemia in chronic kidney disease: Secondary | ICD-10-CM | POA: Diagnosis not present

## 2023-05-18 DIAGNOSIS — Z992 Dependence on renal dialysis: Secondary | ICD-10-CM | POA: Diagnosis not present

## 2023-05-18 DIAGNOSIS — D509 Iron deficiency anemia, unspecified: Secondary | ICD-10-CM | POA: Diagnosis not present

## 2023-05-18 DIAGNOSIS — N2581 Secondary hyperparathyroidism of renal origin: Secondary | ICD-10-CM | POA: Diagnosis not present

## 2023-05-20 DIAGNOSIS — D631 Anemia in chronic kidney disease: Secondary | ICD-10-CM | POA: Diagnosis not present

## 2023-05-20 DIAGNOSIS — D509 Iron deficiency anemia, unspecified: Secondary | ICD-10-CM | POA: Diagnosis not present

## 2023-05-20 DIAGNOSIS — D689 Coagulation defect, unspecified: Secondary | ICD-10-CM | POA: Diagnosis not present

## 2023-05-20 DIAGNOSIS — N2581 Secondary hyperparathyroidism of renal origin: Secondary | ICD-10-CM | POA: Diagnosis not present

## 2023-05-20 DIAGNOSIS — N186 End stage renal disease: Secondary | ICD-10-CM | POA: Diagnosis not present

## 2023-05-20 DIAGNOSIS — Z992 Dependence on renal dialysis: Secondary | ICD-10-CM | POA: Diagnosis not present

## 2023-05-22 DIAGNOSIS — D631 Anemia in chronic kidney disease: Secondary | ICD-10-CM | POA: Diagnosis not present

## 2023-05-22 DIAGNOSIS — D509 Iron deficiency anemia, unspecified: Secondary | ICD-10-CM | POA: Diagnosis not present

## 2023-05-22 DIAGNOSIS — N2581 Secondary hyperparathyroidism of renal origin: Secondary | ICD-10-CM | POA: Diagnosis not present

## 2023-05-22 DIAGNOSIS — N186 End stage renal disease: Secondary | ICD-10-CM | POA: Diagnosis not present

## 2023-05-22 DIAGNOSIS — D689 Coagulation defect, unspecified: Secondary | ICD-10-CM | POA: Diagnosis not present

## 2023-05-22 DIAGNOSIS — Z992 Dependence on renal dialysis: Secondary | ICD-10-CM | POA: Diagnosis not present

## 2023-05-25 DIAGNOSIS — D509 Iron deficiency anemia, unspecified: Secondary | ICD-10-CM | POA: Diagnosis not present

## 2023-05-25 DIAGNOSIS — D689 Coagulation defect, unspecified: Secondary | ICD-10-CM | POA: Diagnosis not present

## 2023-05-25 DIAGNOSIS — Z992 Dependence on renal dialysis: Secondary | ICD-10-CM | POA: Diagnosis not present

## 2023-05-25 DIAGNOSIS — N186 End stage renal disease: Secondary | ICD-10-CM | POA: Diagnosis not present

## 2023-05-25 DIAGNOSIS — N2581 Secondary hyperparathyroidism of renal origin: Secondary | ICD-10-CM | POA: Diagnosis not present

## 2023-05-25 DIAGNOSIS — D631 Anemia in chronic kidney disease: Secondary | ICD-10-CM | POA: Diagnosis not present

## 2023-05-27 DIAGNOSIS — D689 Coagulation defect, unspecified: Secondary | ICD-10-CM | POA: Diagnosis not present

## 2023-05-27 DIAGNOSIS — D631 Anemia in chronic kidney disease: Secondary | ICD-10-CM | POA: Diagnosis not present

## 2023-05-27 DIAGNOSIS — D509 Iron deficiency anemia, unspecified: Secondary | ICD-10-CM | POA: Diagnosis not present

## 2023-05-27 DIAGNOSIS — N2581 Secondary hyperparathyroidism of renal origin: Secondary | ICD-10-CM | POA: Diagnosis not present

## 2023-05-27 DIAGNOSIS — Z992 Dependence on renal dialysis: Secondary | ICD-10-CM | POA: Diagnosis not present

## 2023-05-27 DIAGNOSIS — N186 End stage renal disease: Secondary | ICD-10-CM | POA: Diagnosis not present

## 2023-05-29 DIAGNOSIS — Z992 Dependence on renal dialysis: Secondary | ICD-10-CM | POA: Diagnosis not present

## 2023-05-29 DIAGNOSIS — D509 Iron deficiency anemia, unspecified: Secondary | ICD-10-CM | POA: Diagnosis not present

## 2023-05-29 DIAGNOSIS — D631 Anemia in chronic kidney disease: Secondary | ICD-10-CM | POA: Diagnosis not present

## 2023-05-29 DIAGNOSIS — D689 Coagulation defect, unspecified: Secondary | ICD-10-CM | POA: Diagnosis not present

## 2023-05-29 DIAGNOSIS — N2581 Secondary hyperparathyroidism of renal origin: Secondary | ICD-10-CM | POA: Diagnosis not present

## 2023-05-29 DIAGNOSIS — N186 End stage renal disease: Secondary | ICD-10-CM | POA: Diagnosis not present

## 2023-06-01 DIAGNOSIS — D689 Coagulation defect, unspecified: Secondary | ICD-10-CM | POA: Diagnosis not present

## 2023-06-01 DIAGNOSIS — D509 Iron deficiency anemia, unspecified: Secondary | ICD-10-CM | POA: Diagnosis not present

## 2023-06-01 DIAGNOSIS — D631 Anemia in chronic kidney disease: Secondary | ICD-10-CM | POA: Diagnosis not present

## 2023-06-01 DIAGNOSIS — Z992 Dependence on renal dialysis: Secondary | ICD-10-CM | POA: Diagnosis not present

## 2023-06-01 DIAGNOSIS — N2581 Secondary hyperparathyroidism of renal origin: Secondary | ICD-10-CM | POA: Diagnosis not present

## 2023-06-01 DIAGNOSIS — N186 End stage renal disease: Secondary | ICD-10-CM | POA: Diagnosis not present

## 2023-06-02 DIAGNOSIS — N186 End stage renal disease: Secondary | ICD-10-CM | POA: Diagnosis not present

## 2023-06-02 DIAGNOSIS — I129 Hypertensive chronic kidney disease with stage 1 through stage 4 chronic kidney disease, or unspecified chronic kidney disease: Secondary | ICD-10-CM | POA: Diagnosis not present

## 2023-06-02 DIAGNOSIS — Z992 Dependence on renal dialysis: Secondary | ICD-10-CM | POA: Diagnosis not present

## 2023-06-03 DIAGNOSIS — N186 End stage renal disease: Secondary | ICD-10-CM | POA: Diagnosis not present

## 2023-06-03 DIAGNOSIS — N2581 Secondary hyperparathyroidism of renal origin: Secondary | ICD-10-CM | POA: Diagnosis not present

## 2023-06-03 DIAGNOSIS — D631 Anemia in chronic kidney disease: Secondary | ICD-10-CM | POA: Diagnosis not present

## 2023-06-03 DIAGNOSIS — Z992 Dependence on renal dialysis: Secondary | ICD-10-CM | POA: Diagnosis not present

## 2023-06-03 DIAGNOSIS — D509 Iron deficiency anemia, unspecified: Secondary | ICD-10-CM | POA: Diagnosis not present

## 2023-06-03 DIAGNOSIS — D689 Coagulation defect, unspecified: Secondary | ICD-10-CM | POA: Diagnosis not present

## 2023-06-05 DIAGNOSIS — D509 Iron deficiency anemia, unspecified: Secondary | ICD-10-CM | POA: Diagnosis not present

## 2023-06-05 DIAGNOSIS — N2581 Secondary hyperparathyroidism of renal origin: Secondary | ICD-10-CM | POA: Diagnosis not present

## 2023-06-05 DIAGNOSIS — N186 End stage renal disease: Secondary | ICD-10-CM | POA: Diagnosis not present

## 2023-06-05 DIAGNOSIS — D689 Coagulation defect, unspecified: Secondary | ICD-10-CM | POA: Diagnosis not present

## 2023-06-05 DIAGNOSIS — Z992 Dependence on renal dialysis: Secondary | ICD-10-CM | POA: Diagnosis not present

## 2023-06-05 DIAGNOSIS — D631 Anemia in chronic kidney disease: Secondary | ICD-10-CM | POA: Diagnosis not present

## 2023-06-08 DIAGNOSIS — Z992 Dependence on renal dialysis: Secondary | ICD-10-CM | POA: Diagnosis not present

## 2023-06-08 DIAGNOSIS — D689 Coagulation defect, unspecified: Secondary | ICD-10-CM | POA: Diagnosis not present

## 2023-06-08 DIAGNOSIS — N2581 Secondary hyperparathyroidism of renal origin: Secondary | ICD-10-CM | POA: Diagnosis not present

## 2023-06-08 DIAGNOSIS — D631 Anemia in chronic kidney disease: Secondary | ICD-10-CM | POA: Diagnosis not present

## 2023-06-08 DIAGNOSIS — D509 Iron deficiency anemia, unspecified: Secondary | ICD-10-CM | POA: Diagnosis not present

## 2023-06-08 DIAGNOSIS — N186 End stage renal disease: Secondary | ICD-10-CM | POA: Diagnosis not present

## 2023-06-10 DIAGNOSIS — D631 Anemia in chronic kidney disease: Secondary | ICD-10-CM | POA: Diagnosis not present

## 2023-06-10 DIAGNOSIS — D689 Coagulation defect, unspecified: Secondary | ICD-10-CM | POA: Diagnosis not present

## 2023-06-10 DIAGNOSIS — D509 Iron deficiency anemia, unspecified: Secondary | ICD-10-CM | POA: Diagnosis not present

## 2023-06-10 DIAGNOSIS — N2581 Secondary hyperparathyroidism of renal origin: Secondary | ICD-10-CM | POA: Diagnosis not present

## 2023-06-10 DIAGNOSIS — Z992 Dependence on renal dialysis: Secondary | ICD-10-CM | POA: Diagnosis not present

## 2023-06-10 DIAGNOSIS — N186 End stage renal disease: Secondary | ICD-10-CM | POA: Diagnosis not present

## 2023-06-12 DIAGNOSIS — N186 End stage renal disease: Secondary | ICD-10-CM | POA: Diagnosis not present

## 2023-06-12 DIAGNOSIS — N2581 Secondary hyperparathyroidism of renal origin: Secondary | ICD-10-CM | POA: Diagnosis not present

## 2023-06-12 DIAGNOSIS — D631 Anemia in chronic kidney disease: Secondary | ICD-10-CM | POA: Diagnosis not present

## 2023-06-12 DIAGNOSIS — D509 Iron deficiency anemia, unspecified: Secondary | ICD-10-CM | POA: Diagnosis not present

## 2023-06-12 DIAGNOSIS — D689 Coagulation defect, unspecified: Secondary | ICD-10-CM | POA: Diagnosis not present

## 2023-06-12 DIAGNOSIS — Z992 Dependence on renal dialysis: Secondary | ICD-10-CM | POA: Diagnosis not present

## 2023-06-15 DIAGNOSIS — N2581 Secondary hyperparathyroidism of renal origin: Secondary | ICD-10-CM | POA: Diagnosis not present

## 2023-06-15 DIAGNOSIS — Z992 Dependence on renal dialysis: Secondary | ICD-10-CM | POA: Diagnosis not present

## 2023-06-15 DIAGNOSIS — N186 End stage renal disease: Secondary | ICD-10-CM | POA: Diagnosis not present

## 2023-06-15 DIAGNOSIS — D689 Coagulation defect, unspecified: Secondary | ICD-10-CM | POA: Diagnosis not present

## 2023-06-15 DIAGNOSIS — D509 Iron deficiency anemia, unspecified: Secondary | ICD-10-CM | POA: Diagnosis not present

## 2023-06-15 DIAGNOSIS — D631 Anemia in chronic kidney disease: Secondary | ICD-10-CM | POA: Diagnosis not present

## 2023-06-17 ENCOUNTER — Other Ambulatory Visit: Payer: Self-pay | Admitting: Family

## 2023-06-17 DIAGNOSIS — D509 Iron deficiency anemia, unspecified: Secondary | ICD-10-CM | POA: Diagnosis not present

## 2023-06-17 DIAGNOSIS — N186 End stage renal disease: Secondary | ICD-10-CM | POA: Diagnosis not present

## 2023-06-17 DIAGNOSIS — D631 Anemia in chronic kidney disease: Secondary | ICD-10-CM | POA: Diagnosis not present

## 2023-06-17 DIAGNOSIS — E7800 Pure hypercholesterolemia, unspecified: Secondary | ICD-10-CM

## 2023-06-17 DIAGNOSIS — D689 Coagulation defect, unspecified: Secondary | ICD-10-CM | POA: Diagnosis not present

## 2023-06-17 DIAGNOSIS — N2581 Secondary hyperparathyroidism of renal origin: Secondary | ICD-10-CM | POA: Diagnosis not present

## 2023-06-17 DIAGNOSIS — Z992 Dependence on renal dialysis: Secondary | ICD-10-CM | POA: Diagnosis not present

## 2023-06-19 DIAGNOSIS — D631 Anemia in chronic kidney disease: Secondary | ICD-10-CM | POA: Diagnosis not present

## 2023-06-19 DIAGNOSIS — N2581 Secondary hyperparathyroidism of renal origin: Secondary | ICD-10-CM | POA: Diagnosis not present

## 2023-06-19 DIAGNOSIS — D689 Coagulation defect, unspecified: Secondary | ICD-10-CM | POA: Diagnosis not present

## 2023-06-19 DIAGNOSIS — Z992 Dependence on renal dialysis: Secondary | ICD-10-CM | POA: Diagnosis not present

## 2023-06-19 DIAGNOSIS — D509 Iron deficiency anemia, unspecified: Secondary | ICD-10-CM | POA: Diagnosis not present

## 2023-06-19 DIAGNOSIS — N186 End stage renal disease: Secondary | ICD-10-CM | POA: Diagnosis not present

## 2023-06-22 ENCOUNTER — Telehealth: Payer: Self-pay | Admitting: *Deleted

## 2023-06-22 DIAGNOSIS — N186 End stage renal disease: Secondary | ICD-10-CM | POA: Diagnosis not present

## 2023-06-22 DIAGNOSIS — D509 Iron deficiency anemia, unspecified: Secondary | ICD-10-CM | POA: Diagnosis not present

## 2023-06-22 DIAGNOSIS — Z992 Dependence on renal dialysis: Secondary | ICD-10-CM | POA: Diagnosis not present

## 2023-06-22 DIAGNOSIS — D631 Anemia in chronic kidney disease: Secondary | ICD-10-CM | POA: Diagnosis not present

## 2023-06-22 DIAGNOSIS — D689 Coagulation defect, unspecified: Secondary | ICD-10-CM | POA: Diagnosis not present

## 2023-06-22 DIAGNOSIS — N2581 Secondary hyperparathyroidism of renal origin: Secondary | ICD-10-CM | POA: Diagnosis not present

## 2023-06-22 NOTE — Telephone Encounter (Signed)
 Called patient to inform of CT for 07-07-23- arrival time- 1:15 pm @ [facility], no restrictions to scan, patient to receive results from Dr. Eloise Hake on 07-15-23 @ 11 am, lvm for a return call

## 2023-06-24 DIAGNOSIS — Z992 Dependence on renal dialysis: Secondary | ICD-10-CM | POA: Diagnosis not present

## 2023-06-24 DIAGNOSIS — D631 Anemia in chronic kidney disease: Secondary | ICD-10-CM | POA: Diagnosis not present

## 2023-06-24 DIAGNOSIS — D509 Iron deficiency anemia, unspecified: Secondary | ICD-10-CM | POA: Diagnosis not present

## 2023-06-24 DIAGNOSIS — N186 End stage renal disease: Secondary | ICD-10-CM | POA: Diagnosis not present

## 2023-06-24 DIAGNOSIS — N2581 Secondary hyperparathyroidism of renal origin: Secondary | ICD-10-CM | POA: Diagnosis not present

## 2023-06-24 DIAGNOSIS — D689 Coagulation defect, unspecified: Secondary | ICD-10-CM | POA: Diagnosis not present

## 2023-06-26 DIAGNOSIS — D689 Coagulation defect, unspecified: Secondary | ICD-10-CM | POA: Diagnosis not present

## 2023-06-26 DIAGNOSIS — D509 Iron deficiency anemia, unspecified: Secondary | ICD-10-CM | POA: Diagnosis not present

## 2023-06-26 DIAGNOSIS — Z992 Dependence on renal dialysis: Secondary | ICD-10-CM | POA: Diagnosis not present

## 2023-06-26 DIAGNOSIS — N2581 Secondary hyperparathyroidism of renal origin: Secondary | ICD-10-CM | POA: Diagnosis not present

## 2023-06-26 DIAGNOSIS — N186 End stage renal disease: Secondary | ICD-10-CM | POA: Diagnosis not present

## 2023-06-26 DIAGNOSIS — D631 Anemia in chronic kidney disease: Secondary | ICD-10-CM | POA: Diagnosis not present

## 2023-06-29 DIAGNOSIS — N2581 Secondary hyperparathyroidism of renal origin: Secondary | ICD-10-CM | POA: Diagnosis not present

## 2023-06-29 DIAGNOSIS — D509 Iron deficiency anemia, unspecified: Secondary | ICD-10-CM | POA: Diagnosis not present

## 2023-06-29 DIAGNOSIS — D689 Coagulation defect, unspecified: Secondary | ICD-10-CM | POA: Diagnosis not present

## 2023-06-29 DIAGNOSIS — N186 End stage renal disease: Secondary | ICD-10-CM | POA: Diagnosis not present

## 2023-06-29 DIAGNOSIS — D631 Anemia in chronic kidney disease: Secondary | ICD-10-CM | POA: Diagnosis not present

## 2023-06-29 DIAGNOSIS — Z992 Dependence on renal dialysis: Secondary | ICD-10-CM | POA: Diagnosis not present

## 2023-07-01 DIAGNOSIS — N186 End stage renal disease: Secondary | ICD-10-CM | POA: Diagnosis not present

## 2023-07-01 DIAGNOSIS — D631 Anemia in chronic kidney disease: Secondary | ICD-10-CM | POA: Diagnosis not present

## 2023-07-01 DIAGNOSIS — D689 Coagulation defect, unspecified: Secondary | ICD-10-CM | POA: Diagnosis not present

## 2023-07-01 DIAGNOSIS — Z992 Dependence on renal dialysis: Secondary | ICD-10-CM | POA: Diagnosis not present

## 2023-07-01 DIAGNOSIS — D509 Iron deficiency anemia, unspecified: Secondary | ICD-10-CM | POA: Diagnosis not present

## 2023-07-01 DIAGNOSIS — N2581 Secondary hyperparathyroidism of renal origin: Secondary | ICD-10-CM | POA: Diagnosis not present

## 2023-07-03 DIAGNOSIS — Z992 Dependence on renal dialysis: Secondary | ICD-10-CM | POA: Diagnosis not present

## 2023-07-03 DIAGNOSIS — D689 Coagulation defect, unspecified: Secondary | ICD-10-CM | POA: Diagnosis not present

## 2023-07-03 DIAGNOSIS — D509 Iron deficiency anemia, unspecified: Secondary | ICD-10-CM | POA: Diagnosis not present

## 2023-07-03 DIAGNOSIS — D631 Anemia in chronic kidney disease: Secondary | ICD-10-CM | POA: Diagnosis not present

## 2023-07-03 DIAGNOSIS — N2581 Secondary hyperparathyroidism of renal origin: Secondary | ICD-10-CM | POA: Diagnosis not present

## 2023-07-03 DIAGNOSIS — N186 End stage renal disease: Secondary | ICD-10-CM | POA: Diagnosis not present

## 2023-07-03 DIAGNOSIS — I129 Hypertensive chronic kidney disease with stage 1 through stage 4 chronic kidney disease, or unspecified chronic kidney disease: Secondary | ICD-10-CM | POA: Diagnosis not present

## 2023-07-06 DIAGNOSIS — N2581 Secondary hyperparathyroidism of renal origin: Secondary | ICD-10-CM | POA: Diagnosis not present

## 2023-07-06 DIAGNOSIS — D689 Coagulation defect, unspecified: Secondary | ICD-10-CM | POA: Diagnosis not present

## 2023-07-06 DIAGNOSIS — Z992 Dependence on renal dialysis: Secondary | ICD-10-CM | POA: Diagnosis not present

## 2023-07-06 DIAGNOSIS — N186 End stage renal disease: Secondary | ICD-10-CM | POA: Diagnosis not present

## 2023-07-06 DIAGNOSIS — D631 Anemia in chronic kidney disease: Secondary | ICD-10-CM | POA: Diagnosis not present

## 2023-07-07 ENCOUNTER — Ambulatory Visit (HOSPITAL_COMMUNITY)
Admission: RE | Admit: 2023-07-07 | Discharge: 2023-07-07 | Disposition: A | Discharge status: Home / Self Care | Source: Ambulatory Visit | Attending: Radiology | Admitting: Radiology

## 2023-07-07 DIAGNOSIS — C3491 Malignant neoplasm of unspecified part of right bronchus or lung: Secondary | ICD-10-CM | POA: Diagnosis not present

## 2023-07-07 DIAGNOSIS — I7121 Aneurysm of the ascending aorta, without rupture: Secondary | ICD-10-CM | POA: Diagnosis not present

## 2023-07-07 DIAGNOSIS — C3490 Malignant neoplasm of unspecified part of unspecified bronchus or lung: Secondary | ICD-10-CM | POA: Diagnosis not present

## 2023-07-07 DIAGNOSIS — J432 Centrilobular emphysema: Secondary | ICD-10-CM | POA: Diagnosis not present

## 2023-07-08 DIAGNOSIS — N186 End stage renal disease: Secondary | ICD-10-CM | POA: Diagnosis not present

## 2023-07-08 DIAGNOSIS — Z992 Dependence on renal dialysis: Secondary | ICD-10-CM | POA: Diagnosis not present

## 2023-07-08 DIAGNOSIS — N2581 Secondary hyperparathyroidism of renal origin: Secondary | ICD-10-CM | POA: Diagnosis not present

## 2023-07-08 DIAGNOSIS — D689 Coagulation defect, unspecified: Secondary | ICD-10-CM | POA: Diagnosis not present

## 2023-07-08 DIAGNOSIS — D631 Anemia in chronic kidney disease: Secondary | ICD-10-CM | POA: Diagnosis not present

## 2023-07-10 DIAGNOSIS — Z992 Dependence on renal dialysis: Secondary | ICD-10-CM | POA: Diagnosis not present

## 2023-07-10 DIAGNOSIS — N186 End stage renal disease: Secondary | ICD-10-CM | POA: Diagnosis not present

## 2023-07-10 DIAGNOSIS — D689 Coagulation defect, unspecified: Secondary | ICD-10-CM | POA: Diagnosis not present

## 2023-07-10 DIAGNOSIS — N2581 Secondary hyperparathyroidism of renal origin: Secondary | ICD-10-CM | POA: Diagnosis not present

## 2023-07-10 DIAGNOSIS — D631 Anemia in chronic kidney disease: Secondary | ICD-10-CM | POA: Diagnosis not present

## 2023-07-13 DIAGNOSIS — D631 Anemia in chronic kidney disease: Secondary | ICD-10-CM | POA: Diagnosis not present

## 2023-07-13 DIAGNOSIS — N186 End stage renal disease: Secondary | ICD-10-CM | POA: Diagnosis not present

## 2023-07-13 DIAGNOSIS — N2581 Secondary hyperparathyroidism of renal origin: Secondary | ICD-10-CM | POA: Diagnosis not present

## 2023-07-13 DIAGNOSIS — D689 Coagulation defect, unspecified: Secondary | ICD-10-CM | POA: Diagnosis not present

## 2023-07-13 DIAGNOSIS — Z992 Dependence on renal dialysis: Secondary | ICD-10-CM | POA: Diagnosis not present

## 2023-07-15 ENCOUNTER — Ambulatory Visit: Payer: Self-pay | Admitting: Radiation Oncology

## 2023-07-15 DIAGNOSIS — Z992 Dependence on renal dialysis: Secondary | ICD-10-CM | POA: Diagnosis not present

## 2023-07-15 DIAGNOSIS — D631 Anemia in chronic kidney disease: Secondary | ICD-10-CM | POA: Diagnosis not present

## 2023-07-15 DIAGNOSIS — N186 End stage renal disease: Secondary | ICD-10-CM | POA: Diagnosis not present

## 2023-07-15 DIAGNOSIS — D689 Coagulation defect, unspecified: Secondary | ICD-10-CM | POA: Diagnosis not present

## 2023-07-15 DIAGNOSIS — N2581 Secondary hyperparathyroidism of renal origin: Secondary | ICD-10-CM | POA: Diagnosis not present

## 2023-07-17 DIAGNOSIS — N186 End stage renal disease: Secondary | ICD-10-CM | POA: Diagnosis not present

## 2023-07-17 DIAGNOSIS — D631 Anemia in chronic kidney disease: Secondary | ICD-10-CM | POA: Diagnosis not present

## 2023-07-17 DIAGNOSIS — N2581 Secondary hyperparathyroidism of renal origin: Secondary | ICD-10-CM | POA: Diagnosis not present

## 2023-07-17 DIAGNOSIS — D689 Coagulation defect, unspecified: Secondary | ICD-10-CM | POA: Diagnosis not present

## 2023-07-17 DIAGNOSIS — Z992 Dependence on renal dialysis: Secondary | ICD-10-CM | POA: Diagnosis not present

## 2023-07-17 NOTE — Progress Notes (Signed)
 Radiation Oncology         [phone] ________________________________  Name: Kimberly Frost MRN: [id number]  Date: 07/19/2023  DOB: Apr 06, 1950  Follow-Up Visit Note  CC: Felicita Horns, FNP  Aleene Amor, MD  No diagnosis found.  Diagnosis: The primary encounter diagnosis was NSCLC of right lung (HCC). A diagnosis of NSCLC of lower lobe (HCC) was also pertinent to this visit.   Stage IB (cT2, N0, M0) NSCLC of the RUL  Interval Since Last Radiation: 1 year, 10 months, and 24 day s  Intent: Curative  Radiation Treatment Dates: 08/12/2021 through 08/25/2021 Site Technique Total Dose (Gy) Dose per Fx (Gy) Completed Fx Beam Energies  Lung, Right: Lung_R IMRT 50/50 5 10/10 [id number]    Narrative:  The patient returns today for routine follow-up and to review recent imaging. She was last seen here for follow-up on 01/04/23.  Her most recent chest CT performed on 07/07/23 demonstrates a progressive consolidation in the anterior right upper lobe, extending from the apex to the right hilum, and seemingly overshadowing the original nodule in this area which is no longer readily visible separately. CT otherwise showed stability of the likely benign RLL nodule measuring 6 mm. Other notable findings included a 4.0 cm ascending thoracici aortic aneurysm.             No other significant interval history since the patient was last seen for follow-up.              ***         Allergies:  has no known allergies.  Meds: Current Outpatient Medications  Medication Sig Dispense Refill   acetaminophen  (TYLENOL ) 500 MG tablet Take 1,000 mg by mouth every 6 (six) hours as needed for moderate pain or headache.     atorvastatin  (LIPITOR) 80 MG tablet Take 1 tablet (80 mg total) by mouth daily. 90 tablet 3   busPIRone  (BUSPAR ) 5 MG tablet TAKE 1/2 TABLET BY MOUTH TWICE DAILY 90 tablet 1   cyclobenzaprine (FLEXERIL) 5 MG tablet Take 5 mg by mouth 2 (two) times daily as needed.     gabapentin   (NEURONTIN ) 100 MG capsule Take 100-200 mg by mouth at bedtime.     midodrine (PROAMATINE) 10 MG tablet Take 5 mg by mouth See admin instructions. Take 1 tablet (5mg ) by mouth 30 minutes before dialysis and take 1 tablet (5mg ) by mouth during dialysis if needed for BP support     multivitamin (RENA-VIT) TABS tablet Take 1 tablet by mouth daily.     sertraline  (ZOLOFT ) 25 MG tablet TAKE 1 TABLET (25 MG TOTAL) BY MOUTH DAILY. 90 tablet 0   sucroferric oxyhydroxide (VELPHORO ) 500 MG chewable tablet Chew 500-1,000 mg by mouth See admin instructions. Take 2 tablets (1000mg ) by mouth three times daily with meals and take 1 tablet (500mg ) by mouth with snacks     No current facility-administered medications for this encounter.    Physical Findings: The patient is in no acute distress. Patient is alert and oriented.  vitals were not taken for this visit. .  No significant changes. Lungs are clear to auscultation bilaterally. Heart has regular rate and rhythm. No palpable cervical, supraclavicular, or axillary adenopathy. Abdomen soft, non-tender, normal bowel sounds.   Lab Findings: Lab Results  Component Value Date   WBC 7.8 12/14/2022   HGB 11.9 (L) 12/14/2022   HCT 35.3 (L) 12/14/2022   MCV 87.8 12/14/2022   PLT 287.0 12/14/2022    Radiographic Findings: CT  Chest Wo Contrast Result Date: 07/14/2023 CLINICAL DATA:  Non-small cell lung cancer restaging * Tracking Code: BO * EXAM: CT CHEST WITHOUT CONTRAST TECHNIQUE: Multidetector CT imaging of the chest was performed following the standard protocol without IV contrast. RADIATION DOSE REDUCTION: This exam was performed according to the departmental dose-optimization program which includes automated exposure control, adjustment of the mA and/or kV according to patient size and/or use of iterative reconstruction technique. COMPARISON:  12/28/2022 FINDINGS: Cardiovascular: Coronary, aortic arch, and branch vessel atherosclerotic vascular disease.  Ascending thoracic aortic aneurysm 4.0 cm in diameter on image 27 series 2. Mediastinum/Nodes: No pathologic adenopathy observed. Lungs/Pleura: Progressive consolidation of the right upper lobe anteriorly extending from the apex to the right hilum and inclusive of the original nodule which is no longer readily visible separately. Best I can tell the patient's last radiation therapy was around 08/25/2021. The progression is a bit unusual given that the radiation therapy was about 4 months prior to the previous scan. If clinically indicated, nuclear medicine PET-CT may provide some useful adjunct information in terms of assessing regional activity. A right lower lobe nodule measures 0.6 by 0.5 by 0.5 cm on image 112 series 4, previously measuring similarly on prior exams back through 10/30/2020, likely benign. Mild scarring or atelectasis along the lung bases. Centrilobular emphysema. Upper Abdomen: Numerous stable hypodense renal lesions too small to characterize. No further imaging workup of these lesions is indicated. Musculoskeletal: Degenerative right sternoclavicular arthropathy. IMPRESSION: 1. Progressive consolidation of the right upper lobe anteriorly extending from the apex to the right hilum and inclusive of the original nodule which is no longer readily visible separately. The progression is a bit unusual given that the last radiation therapy treatment I can find was about 4 months prior to the previous scan. If clinically indicated, nuclear medicine PET-CT may provide some useful adjunct information in terms of assessing regional activity. 2. Chronically stable right lower lobe nodule measuring 0.6 by 0.5 by 0.5 cm, likely benign. 3. Ascending thoracic aortic aneurysm 4.0 cm in diameter. Recommend annual imaging followup by CTA or MRA. This recommendation follows 2010 ACCF/AHA/AATS/ACR/ASA/SCA/SCAI/SIR/STS/SVM Guidelines for the Diagnosis and Management of Patients with Thoracic Aortic Disease.  Circulation. 2010; 121: Z610-R604. Aortic aneurysm NOS (ICD10-I71.9) 4. Degenerative right sternoclavicular arthropathy. 5. Aortic Atherosclerosis (ICD10-I70.0) and Emphysema (ICD10-J43.9). Electronically Signed   By: Freida Jes M.D.   On: 07/14/2023 20:15    Impression: The primary encounter diagnosis was NSCLC of right lung (HCC). A diagnosis of NSCLC of lower lobe (HCC) was also pertinent to this visit.   Stage IB (cT2, N0, M0) NSCLC of the RUL  The patient is recovering from the effects of radiation.  ***  Plan:  ***   *** minutes of total time was spent for this patient encounter, including preparation, face-to-face counseling with the patient and coordination of care, physical exam, and documentation of the encounter. ____________________________________  Noralee Beam, PhD, MD  This document serves as a record of services personally performed by Retta Caster, MD. It was created on his behalf by Aleta Anda, a trained medical scribe. The creation of this record is based on the scribe's personal observations and the provider's statements to them. This document has been checked and approved by the attending provider.

## 2023-07-19 ENCOUNTER — Ambulatory Visit
Admission: RE | Admit: 2023-07-19 | Discharge: 2023-07-19 | Disposition: A | Discharge status: Home / Self Care | Source: Ambulatory Visit | Attending: Radiation Oncology | Admitting: Radiation Oncology

## 2023-07-19 ENCOUNTER — Encounter: Payer: Self-pay | Admitting: Radiation Oncology

## 2023-07-19 ENCOUNTER — Telehealth: Payer: Self-pay | Admitting: *Deleted

## 2023-07-19 VITALS — BP 140/68 | HR 94 | Temp 97.1°F | Resp 18 | Ht 65.0 in | Wt 137.0 lb

## 2023-07-19 DIAGNOSIS — I7121 Aneurysm of the ascending aorta, without rupture: Secondary | ICD-10-CM | POA: Diagnosis not present

## 2023-07-19 DIAGNOSIS — I700 Atherosclerosis of aorta: Secondary | ICD-10-CM | POA: Insufficient documentation

## 2023-07-19 DIAGNOSIS — Z79899 Other long term (current) drug therapy: Secondary | ICD-10-CM | POA: Insufficient documentation

## 2023-07-19 DIAGNOSIS — C3491 Malignant neoplasm of unspecified part of right bronchus or lung: Secondary | ICD-10-CM

## 2023-07-19 DIAGNOSIS — I2510 Atherosclerotic heart disease of native coronary artery without angina pectoris: Secondary | ICD-10-CM | POA: Insufficient documentation

## 2023-07-19 DIAGNOSIS — J432 Centrilobular emphysema: Secondary | ICD-10-CM | POA: Diagnosis not present

## 2023-07-19 DIAGNOSIS — R918 Other nonspecific abnormal finding of lung field: Secondary | ICD-10-CM | POA: Insufficient documentation

## 2023-07-19 DIAGNOSIS — Z87891 Personal history of nicotine dependence: Secondary | ICD-10-CM | POA: Diagnosis not present

## 2023-07-19 DIAGNOSIS — C3411 Malignant neoplasm of upper lobe, right bronchus or lung: Secondary | ICD-10-CM | POA: Diagnosis not present

## 2023-07-19 DIAGNOSIS — C3431 Malignant neoplasm of lower lobe, right bronchus or lung: Secondary | ICD-10-CM | POA: Diagnosis not present

## 2023-07-19 NOTE — Telephone Encounter (Signed)
 CALLED PATIENT TO INFORM OF PET SCAN FOR 07-28-23- ARRIVAL TIME- 4 PM @ [facility], PATIENT TO HAVE WATER ONLY- 6 HRS. PRIOR TO SCAN, PATIENT TO RECEIVE RESULTS FROM DR. KINARD ON  08-02-23 @ 3:45 PM, SPOKE WITH PATIENT AND SHE IS AWARE OF THESE APPTS. AND THE INSTRUCTIONS

## 2023-07-19 NOTE — Progress Notes (Signed)
 Kimberly Frost is here today for follow up post radiation to the lung.  Lung Side: Right,patient completed treatment on 08/25/21.  Does the patient complain of any of the following: Pain:No Shortness of breath w/wo exertion: Yes at times.  Cough: Yes-dry cough Hemoptysis: No Pain with swallowing: No Swallowing/choking concerns: No Appetite: Good Energy Level: low continues to have dialysis  Post radiation skin Changes: No    Additional comments if applicable:  BP (!) 140/68 (BP Location: Left Arm, Patient Position: Sitting)   Pulse 94   Temp (!) 97.1 F (36.2 C) (Temporal)   Resp 18   Ht 5' 5 (1.651 m)   Wt 137 lb (62.1 kg)   SpO2 99%   BMI 22.80 kg/m

## 2023-07-20 DIAGNOSIS — D631 Anemia in chronic kidney disease: Secondary | ICD-10-CM | POA: Diagnosis not present

## 2023-07-20 DIAGNOSIS — N2581 Secondary hyperparathyroidism of renal origin: Secondary | ICD-10-CM | POA: Diagnosis not present

## 2023-07-20 DIAGNOSIS — Z992 Dependence on renal dialysis: Secondary | ICD-10-CM | POA: Diagnosis not present

## 2023-07-20 DIAGNOSIS — N186 End stage renal disease: Secondary | ICD-10-CM | POA: Diagnosis not present

## 2023-07-20 DIAGNOSIS — D689 Coagulation defect, unspecified: Secondary | ICD-10-CM | POA: Diagnosis not present

## 2023-07-22 DIAGNOSIS — N186 End stage renal disease: Secondary | ICD-10-CM | POA: Diagnosis not present

## 2023-07-22 DIAGNOSIS — Z992 Dependence on renal dialysis: Secondary | ICD-10-CM | POA: Diagnosis not present

## 2023-07-22 DIAGNOSIS — N2581 Secondary hyperparathyroidism of renal origin: Secondary | ICD-10-CM | POA: Diagnosis not present

## 2023-07-22 DIAGNOSIS — D631 Anemia in chronic kidney disease: Secondary | ICD-10-CM | POA: Diagnosis not present

## 2023-07-22 DIAGNOSIS — D689 Coagulation defect, unspecified: Secondary | ICD-10-CM | POA: Diagnosis not present

## 2023-07-24 DIAGNOSIS — N2581 Secondary hyperparathyroidism of renal origin: Secondary | ICD-10-CM | POA: Diagnosis not present

## 2023-07-24 DIAGNOSIS — D631 Anemia in chronic kidney disease: Secondary | ICD-10-CM | POA: Diagnosis not present

## 2023-07-24 DIAGNOSIS — Z992 Dependence on renal dialysis: Secondary | ICD-10-CM | POA: Diagnosis not present

## 2023-07-24 DIAGNOSIS — N186 End stage renal disease: Secondary | ICD-10-CM | POA: Diagnosis not present

## 2023-07-24 DIAGNOSIS — D689 Coagulation defect, unspecified: Secondary | ICD-10-CM | POA: Diagnosis not present

## 2023-07-27 ENCOUNTER — Telehealth: Payer: Self-pay | Admitting: Radiation Oncology

## 2023-07-27 DIAGNOSIS — N2581 Secondary hyperparathyroidism of renal origin: Secondary | ICD-10-CM | POA: Diagnosis not present

## 2023-07-27 DIAGNOSIS — Z992 Dependence on renal dialysis: Secondary | ICD-10-CM | POA: Diagnosis not present

## 2023-07-27 DIAGNOSIS — N186 End stage renal disease: Secondary | ICD-10-CM | POA: Diagnosis not present

## 2023-07-27 DIAGNOSIS — D689 Coagulation defect, unspecified: Secondary | ICD-10-CM | POA: Diagnosis not present

## 2023-07-27 DIAGNOSIS — D631 Anemia in chronic kidney disease: Secondary | ICD-10-CM | POA: Diagnosis not present

## 2023-07-27 NOTE — Telephone Encounter (Signed)
 6/24 Left voicemail concerning her appointment tomorrow with Radiology.  Called patient to confirm her PET scan appt 6/25 and FU15 on 6/30.  No answer/cannot left voicemail. Forward email to Nickey/Tarra and copied Orlean, so they are aware.

## 2023-07-28 ENCOUNTER — Ambulatory Visit (HOSPITAL_COMMUNITY)
Admission: RE | Admit: 2023-07-28 | Discharge: 2023-07-28 | Disposition: A | Discharge status: Home / Self Care | Source: Ambulatory Visit | Attending: Radiology | Admitting: Radiology

## 2023-07-28 DIAGNOSIS — C3491 Malignant neoplasm of unspecified part of right bronchus or lung: Secondary | ICD-10-CM | POA: Diagnosis not present

## 2023-07-28 DIAGNOSIS — C3431 Malignant neoplasm of lower lobe, right bronchus or lung: Secondary | ICD-10-CM | POA: Diagnosis not present

## 2023-07-28 LAB — GLUCOSE, CAPILLARY: Glucose-Capillary: 124 mg/dL — ABNORMAL HIGH (ref 70–99)

## 2023-07-28 MED ORDER — FLUDEOXYGLUCOSE F - 18 (FDG) INJECTION
7.1000 | Freq: Once | INTRAVENOUS | Status: AC
  Administered 2023-07-28: 7.1 via INTRAVENOUS

## 2023-07-29 DIAGNOSIS — N2581 Secondary hyperparathyroidism of renal origin: Secondary | ICD-10-CM | POA: Diagnosis not present

## 2023-07-29 DIAGNOSIS — Z992 Dependence on renal dialysis: Secondary | ICD-10-CM | POA: Diagnosis not present

## 2023-07-29 DIAGNOSIS — D689 Coagulation defect, unspecified: Secondary | ICD-10-CM | POA: Diagnosis not present

## 2023-07-29 DIAGNOSIS — D631 Anemia in chronic kidney disease: Secondary | ICD-10-CM | POA: Diagnosis not present

## 2023-07-29 DIAGNOSIS — N186 End stage renal disease: Secondary | ICD-10-CM | POA: Diagnosis not present

## 2023-07-31 DIAGNOSIS — D689 Coagulation defect, unspecified: Secondary | ICD-10-CM | POA: Diagnosis not present

## 2023-07-31 DIAGNOSIS — N2581 Secondary hyperparathyroidism of renal origin: Secondary | ICD-10-CM | POA: Diagnosis not present

## 2023-07-31 DIAGNOSIS — Z992 Dependence on renal dialysis: Secondary | ICD-10-CM | POA: Diagnosis not present

## 2023-07-31 DIAGNOSIS — D631 Anemia in chronic kidney disease: Secondary | ICD-10-CM | POA: Diagnosis not present

## 2023-07-31 DIAGNOSIS — N186 End stage renal disease: Secondary | ICD-10-CM | POA: Diagnosis not present

## 2023-08-02 ENCOUNTER — Encounter: Payer: Self-pay | Admitting: Radiation Oncology

## 2023-08-02 ENCOUNTER — Ambulatory Visit
Admission: RE | Admit: 2023-08-02 | Discharge: 2023-08-02 | Disposition: A | Discharge status: Home / Self Care | Payer: Self-pay | Source: Ambulatory Visit | Attending: Radiation Oncology | Admitting: Radiation Oncology

## 2023-08-02 DIAGNOSIS — C3491 Malignant neoplasm of unspecified part of right bronchus or lung: Secondary | ICD-10-CM

## 2023-08-02 DIAGNOSIS — Z87891 Personal history of nicotine dependence: Secondary | ICD-10-CM | POA: Diagnosis not present

## 2023-08-02 DIAGNOSIS — N186 End stage renal disease: Secondary | ICD-10-CM | POA: Diagnosis not present

## 2023-08-02 DIAGNOSIS — C3411 Malignant neoplasm of upper lobe, right bronchus or lung: Secondary | ICD-10-CM | POA: Diagnosis not present

## 2023-08-02 DIAGNOSIS — I129 Hypertensive chronic kidney disease with stage 1 through stage 4 chronic kidney disease, or unspecified chronic kidney disease: Secondary | ICD-10-CM | POA: Diagnosis not present

## 2023-08-02 DIAGNOSIS — Z992 Dependence on renal dialysis: Secondary | ICD-10-CM | POA: Diagnosis not present

## 2023-08-02 NOTE — Progress Notes (Signed)
 Kimberly Frost Bertrand    Identity verfied, telephone visit started.   Lung Side: Right   Does the patient complain of any of the following: Pain:No Shortness of breath w/wo exertion: No Cough: Yes,dry cough Hemoptysis: No Pain with swallowing: No Swallowing/choking concerns: No Appetite: Good Energy Level: low due to dialysis Post radiation skin Changes: No    Additional comments if applicable:

## 2023-08-02 NOTE — Progress Notes (Signed)
 Radiation Oncology         [phone] ________________________________  Name: Kimberly Frost MRN: [id number]  Date: 08/02/2023  DOB: 1950-04-19  Follow-Up Visit Note  CC: Corwin Antu, FNP  Winnifred Fonda BROCKS, MD  No diagnosis found.  Diagnosis: Stage IB (cT2, N0, M0) NSCLC of the RUL; s/p SBRT completed on 08/25/2021   Interval Since Last Radiation: 1 year, 11 months, and 6 days   Intent: Curative  Radiation Treatment Dates: 08/12/2021 through 08/25/2021 Site Technique Total Dose (Gy) Dose per Fx (Gy) Completed Fx Beam Energies  Lung, Right: Lung_R IMRT 50/50 5 10/10 6XFFF    Narrative:  The patient returns today to review recent imaging. She was last seen here for a routine follow-up visit on 07/19/23.   To review from her last visit, her most recent chest CT on 07/07/23 demonstrated a progressive consolidation in the anterior right upper lobe, extending from the apex to the right hilum.   Given this finding, further work-up was recommended and she accordingly presented for a restaging PET scan on 07/28/23 which demonstrates low level uptake associated with the mass-like opacity in the RUL possibly representing evolutionary changes (short term follow-up imaging may be considered for this finding).  No additional areas of abnormal uptake or evidence of lymphadenopathy were demonstrated otherwise.   No other significant interval history since the patient was last seen for follow-up.   ***  Allergies:  has no known allergies.  Meds: Current Outpatient Medications  Medication Sig Dispense Refill   acetaminophen  (TYLENOL ) 500 MG tablet Take 1,000 mg by mouth every 6 (six) hours as needed for moderate pain or headache.     atorvastatin  (LIPITOR) 80 MG tablet Take 1 tablet (80 mg total) by mouth daily. 90 tablet 3   busPIRone  (BUSPAR ) 5 MG tablet TAKE 1/2 TABLET BY MOUTH TWICE DAILY 90 tablet 1   cyclobenzaprine (FLEXERIL) 5 MG tablet Take 5 mg by mouth 2 (two) times daily as  needed.     gabapentin  (NEURONTIN ) 100 MG capsule Take 100-200 mg by mouth at bedtime.     midodrine (PROAMATINE) 10 MG tablet Take 5 mg by mouth See admin instructions. Take 1 tablet (5mg ) by mouth 30 minutes before dialysis and take 1 tablet (5mg ) by mouth during dialysis if needed for BP support     multivitamin (RENA-VIT) TABS tablet Take 1 tablet by mouth daily.     sertraline  (ZOLOFT ) 25 MG tablet TAKE 1 TABLET (25 MG TOTAL) BY MOUTH DAILY. (Patient not taking: Reported on 07/19/2023) 90 tablet 0   sucroferric oxyhydroxide (VELPHORO ) 500 MG chewable tablet Chew 500-1,000 mg by mouth See admin instructions. Take 2 tablets (1000mg ) by mouth three times daily with meals and take 1 tablet (500mg ) by mouth with snacks     No current facility-administered medications for this encounter.    Physical Findings: The patient is in no acute distress. Patient is alert and oriented.  vitals were not taken for this visit. .  No significant changes. Lungs are clear to auscultation bilaterally. Heart has regular rate and rhythm. No palpable cervical, supraclavicular, or axillary adenopathy. Abdomen soft, non-tender, normal bowel sounds.   Lab Findings: Lab Results  Component Value Date   WBC 7.8 12/14/2022   HGB 11.9 (L) 12/14/2022   HCT 35.3 (L) 12/14/2022   MCV 87.8 12/14/2022   PLT 287.0 12/14/2022    Radiographic Findings: NM PET Image Restag (PS) Skull Base To Thigh Result Date: 07/29/2023 CLINICAL DATA:  Subsequent treatment  strategy for non-small-cell lung cancer. EXAM: NUCLEAR MEDICINE PET SKULL BASE TO THIGH TECHNIQUE: 7.1 mCi F-18 FDG was injected intravenously. Full-ring PET imaging was performed from the skull base to thigh after the radiotracer. CT data was obtained and used for attenuation correction and anatomic localization. Fasting blood glucose: 124 mg/dl COMPARISON:  Prior PET-CT 07/21/2021. Standard noncontrast CT 07/07/2023 and older FINDINGS: Mediastinal blood pool activity: SUV  max 2.8 Liver activity: SUV max 3.3 NECK: No specific abnormal uptake seen in the neck including along lymph node change of the submandibular, posterior triangle or internal jugular region. Near symmetric uptake of the visualized intracranial compartment. Incidental CT findings: The thyroid  gland, submandibular gland and parotid glands are unremarkable. Diffuse vascular calcifications are identified in the neck. Paranasal sinuses and mastoid air cells are clear. CHEST: Prior examination on CT from 07/07/2023 head demonstrated progressive nodular consolidative like appearance to the right upper lobe extending from the hilum out to the pleura at the apex with some volume loss. This area is again seen similar to the more recent CT scan. The nodular area in question has some uptake of maximum SUV of 3.4. The area neoplasm previously had a more intense uptake in 2023 of maximum SUV value of 9.2. The appearance could be evolutionary change related to prior treatment. There is some occlusion of bronchi in this location. No areas of specific abnormal lung uptake otherwise. There is no abnormal uptake above blood pool in the axillary regions, hilum or mediastinum. Incidental CT findings: Diffuse vascular calcifications identified including the aorta, coronary arteries and great vessels. Diameter of the distal ascending aorta approaches 4 cm as seen previously. Small hiatal hernia with a patulous esophagus. Breathing motion. No pleural effusion. Small right lower lobe lung nodule is again seen measuring 5 mm and again does not show any abnormal uptake. This nodule was present in June of 2023 as well. Basilar areas of scarring and atelectatic changes. Emphysematous lung changes are seen. ABDOMEN/PELVIS: There is physiologic distribution radiotracer along the parenchymal organs, bowel and renal collecting systems. No areas of abnormal nodal uptake. Incidental CT findings: Extensive vascular calcifications. Calcified uterine  fibroids. The liver, spleen, adrenal glands and pancreas are unremarkable. Gallbladder is nondilated. Extracted urinary bladder. Large bowel has a normal course and caliber with scattered stool. Normal appendix. Moderate bilateral renal atrophy with perinephric stranding and numerous cystic lesions, similar to prior exams. SKELETON: No specific abnormal uptake seen along the visualized osseous structures. Incidental CT findings: Scattered degenerative changes. IMPRESSION: The you involving masslike opacity in the right upper lobe as seen on the prior CT scan has low level uptake of maximum SUV of 3.4. This could be evolutionary changes. Considerations would include short-term follow-up CT in 3 months. If there is more clinical concern a more aggressive approach could be considered with tissue sampling. No additional areas of abnormal uptake at this time including along lymph nodes. Diffuse vascular calcifications. Stable ectasia of the ascending aorta of up to 4 cm. Recommend annual imaging followup by CTA or MRA. This recommendation follows 2010 ACCF/AHA/AATS/ACR/ASA/SCA/SCAI/SIR/STS/SVM Guidelines for the Diagnosis and Management of Patients with Thoracic Aortic Disease. Circulation. 2010; 121: Z733-z630. Aortic aneurysm NOS (ICD10-I71.9) Stable right lower lobe small lung nodule without uptake. This is been stable for more than 2 years based on prior reports. Atrophy of the kidneys with cystic areas. Fibroid uterus. Colonic diverticula. Electronically Signed   By: Ranell Bring M.D.   On: 07/29/2023 16:09   CT Chest Wo Contrast Result Date: 07/14/2023  CLINICAL DATA:  Non-small cell lung cancer restaging * Tracking Code: BO * EXAM: CT CHEST WITHOUT CONTRAST TECHNIQUE: Multidetector CT imaging of the chest was performed following the standard protocol without IV contrast. RADIATION DOSE REDUCTION: This exam was performed according to the departmental dose-optimization program which includes automated exposure  control, adjustment of the mA and/or kV according to patient size and/or use of iterative reconstruction technique. COMPARISON:  12/28/2022 FINDINGS: Cardiovascular: Coronary, aortic arch, and branch vessel atherosclerotic vascular disease. Ascending thoracic aortic aneurysm 4.0 cm in diameter on image 27 series 2. Mediastinum/Nodes: No pathologic adenopathy observed. Lungs/Pleura: Progressive consolidation of the right upper lobe anteriorly extending from the apex to the right hilum and inclusive of the original nodule which is no longer readily visible separately. Best I can tell the patient's last radiation therapy was around 08/25/2021. The progression is a bit unusual given that the radiation therapy was about 4 months prior to the previous scan. If clinically indicated, nuclear medicine PET-CT may provide some useful adjunct information in terms of assessing regional activity. A right lower lobe nodule measures 0.6 by 0.5 by 0.5 cm on image 112 series 4, previously measuring similarly on prior exams back through 10/30/2020, likely benign. Mild scarring or atelectasis along the lung bases. Centrilobular emphysema. Upper Abdomen: Numerous stable hypodense renal lesions too small to characterize. No further imaging workup of these lesions is indicated. Musculoskeletal: Degenerative right sternoclavicular arthropathy. IMPRESSION: 1. Progressive consolidation of the right upper lobe anteriorly extending from the apex to the right hilum and inclusive of the original nodule which is no longer readily visible separately. The progression is a bit unusual given that the last radiation therapy treatment I can find was about 4 months prior to the previous scan. If clinically indicated, nuclear medicine PET-CT may provide some useful adjunct information in terms of assessing regional activity. 2. Chronically stable right lower lobe nodule measuring 0.6 by 0.5 by 0.5 cm, likely benign. 3. Ascending thoracic aortic aneurysm  4.0 cm in diameter. Recommend annual imaging followup by CTA or MRA. This recommendation follows 2010 ACCF/AHA/AATS/ACR/ASA/SCA/SCAI/SIR/STS/SVM Guidelines for the Diagnosis and Management of Patients with Thoracic Aortic Disease. Circulation. 2010; 121: Z733-z630. Aortic aneurysm NOS (ICD10-I71.9) 4. Degenerative right sternoclavicular arthropathy. 5. Aortic Atherosclerosis (ICD10-I70.0) and Emphysema (ICD10-J43.9). Electronically Signed   By: Ryan Salvage M.D.   On: 07/14/2023 20:15    Impression: Stage IB (cT2, N0, M0) NSCLC of the RUL; s/p SBRT completed on 08/25/2021   The patient is recovering from the effects of radiation.  ***  Plan:  ***   *** minutes of total time was spent for this patient encounter, including preparation, face-to-face counseling with the patient and coordination of care, physical exam, and documentation of the encounter. ____________________________________  Lynwood CHARM Nasuti, PhD, MD  This document serves as a record of services personally performed by Lynwood Nasuti, MD. It was created on his behalf by Dorthy Fuse, a trained medical scribe. The creation of this record is based on the scribe's personal observations and the provider's statements to them. This document has been checked and approved by the attending provider.

## 2023-08-03 DIAGNOSIS — Z992 Dependence on renal dialysis: Secondary | ICD-10-CM | POA: Diagnosis not present

## 2023-08-03 DIAGNOSIS — N2581 Secondary hyperparathyroidism of renal origin: Secondary | ICD-10-CM | POA: Diagnosis not present

## 2023-08-03 DIAGNOSIS — D689 Coagulation defect, unspecified: Secondary | ICD-10-CM | POA: Diagnosis not present

## 2023-08-03 DIAGNOSIS — N186 End stage renal disease: Secondary | ICD-10-CM | POA: Diagnosis not present

## 2023-08-03 DIAGNOSIS — D631 Anemia in chronic kidney disease: Secondary | ICD-10-CM | POA: Diagnosis not present

## 2023-08-05 ENCOUNTER — Encounter: Admitting: Family

## 2023-08-05 DIAGNOSIS — Z992 Dependence on renal dialysis: Secondary | ICD-10-CM | POA: Diagnosis not present

## 2023-08-05 DIAGNOSIS — D689 Coagulation defect, unspecified: Secondary | ICD-10-CM | POA: Diagnosis not present

## 2023-08-05 DIAGNOSIS — N2581 Secondary hyperparathyroidism of renal origin: Secondary | ICD-10-CM | POA: Diagnosis not present

## 2023-08-05 DIAGNOSIS — D631 Anemia in chronic kidney disease: Secondary | ICD-10-CM | POA: Diagnosis not present

## 2023-08-07 DIAGNOSIS — D631 Anemia in chronic kidney disease: Secondary | ICD-10-CM | POA: Diagnosis not present

## 2023-08-07 DIAGNOSIS — N2581 Secondary hyperparathyroidism of renal origin: Secondary | ICD-10-CM | POA: Diagnosis not present

## 2023-08-07 DIAGNOSIS — N186 End stage renal disease: Secondary | ICD-10-CM | POA: Diagnosis not present

## 2023-08-07 DIAGNOSIS — Z992 Dependence on renal dialysis: Secondary | ICD-10-CM | POA: Diagnosis not present

## 2023-08-10 DIAGNOSIS — Z992 Dependence on renal dialysis: Secondary | ICD-10-CM | POA: Diagnosis not present

## 2023-08-10 DIAGNOSIS — D689 Coagulation defect, unspecified: Secondary | ICD-10-CM | POA: Diagnosis not present

## 2023-08-10 DIAGNOSIS — N2581 Secondary hyperparathyroidism of renal origin: Secondary | ICD-10-CM | POA: Diagnosis not present

## 2023-08-10 DIAGNOSIS — D631 Anemia in chronic kidney disease: Secondary | ICD-10-CM | POA: Diagnosis not present

## 2023-08-12 DIAGNOSIS — N2581 Secondary hyperparathyroidism of renal origin: Secondary | ICD-10-CM | POA: Diagnosis not present

## 2023-08-12 DIAGNOSIS — N186 End stage renal disease: Secondary | ICD-10-CM | POA: Diagnosis not present

## 2023-08-12 DIAGNOSIS — Z992 Dependence on renal dialysis: Secondary | ICD-10-CM | POA: Diagnosis not present

## 2023-08-12 DIAGNOSIS — D631 Anemia in chronic kidney disease: Secondary | ICD-10-CM | POA: Diagnosis not present

## 2023-08-14 DIAGNOSIS — N2581 Secondary hyperparathyroidism of renal origin: Secondary | ICD-10-CM | POA: Diagnosis not present

## 2023-08-17 DIAGNOSIS — D689 Coagulation defect, unspecified: Secondary | ICD-10-CM | POA: Diagnosis not present

## 2023-08-19 DIAGNOSIS — N186 End stage renal disease: Secondary | ICD-10-CM | POA: Diagnosis not present

## 2023-08-19 DIAGNOSIS — Z992 Dependence on renal dialysis: Secondary | ICD-10-CM | POA: Diagnosis not present

## 2023-08-19 DIAGNOSIS — D631 Anemia in chronic kidney disease: Secondary | ICD-10-CM | POA: Diagnosis not present

## 2023-08-19 DIAGNOSIS — N2581 Secondary hyperparathyroidism of renal origin: Secondary | ICD-10-CM | POA: Diagnosis not present

## 2023-08-24 DIAGNOSIS — N186 End stage renal disease: Secondary | ICD-10-CM | POA: Diagnosis not present

## 2023-08-26 DIAGNOSIS — N186 End stage renal disease: Secondary | ICD-10-CM | POA: Diagnosis not present

## 2023-08-26 DIAGNOSIS — D689 Coagulation defect, unspecified: Secondary | ICD-10-CM | POA: Diagnosis not present

## 2023-08-26 DIAGNOSIS — Z992 Dependence on renal dialysis: Secondary | ICD-10-CM | POA: Diagnosis not present

## 2023-08-26 DIAGNOSIS — N2581 Secondary hyperparathyroidism of renal origin: Secondary | ICD-10-CM | POA: Diagnosis not present

## 2023-09-02 DIAGNOSIS — I129 Hypertensive chronic kidney disease with stage 1 through stage 4 chronic kidney disease, or unspecified chronic kidney disease: Secondary | ICD-10-CM | POA: Diagnosis not present

## 2023-09-02 DIAGNOSIS — D631 Anemia in chronic kidney disease: Secondary | ICD-10-CM | POA: Diagnosis not present

## 2023-09-02 DIAGNOSIS — Z992 Dependence on renal dialysis: Secondary | ICD-10-CM | POA: Diagnosis not present

## 2023-09-02 DIAGNOSIS — N2581 Secondary hyperparathyroidism of renal origin: Secondary | ICD-10-CM | POA: Diagnosis not present

## 2023-09-02 DIAGNOSIS — N186 End stage renal disease: Secondary | ICD-10-CM | POA: Diagnosis not present

## 2023-09-02 DIAGNOSIS — D689 Coagulation defect, unspecified: Secondary | ICD-10-CM | POA: Diagnosis not present

## 2023-09-04 DIAGNOSIS — Z992 Dependence on renal dialysis: Secondary | ICD-10-CM | POA: Diagnosis not present

## 2023-09-04 DIAGNOSIS — D689 Coagulation defect, unspecified: Secondary | ICD-10-CM | POA: Diagnosis not present

## 2023-09-04 DIAGNOSIS — N186 End stage renal disease: Secondary | ICD-10-CM | POA: Diagnosis not present

## 2023-09-04 DIAGNOSIS — D631 Anemia in chronic kidney disease: Secondary | ICD-10-CM | POA: Diagnosis not present

## 2023-09-04 DIAGNOSIS — N2581 Secondary hyperparathyroidism of renal origin: Secondary | ICD-10-CM | POA: Diagnosis not present

## 2023-09-07 ENCOUNTER — Encounter: Payer: Self-pay | Admitting: Family

## 2023-09-07 ENCOUNTER — Telehealth: Payer: Self-pay

## 2023-09-07 ENCOUNTER — Ambulatory Visit (INDEPENDENT_AMBULATORY_CARE_PROVIDER_SITE_OTHER): Admitting: Family

## 2023-09-07 ENCOUNTER — Ambulatory Visit: Payer: Self-pay

## 2023-09-07 VITALS — BP 120/68 | HR 84 | Temp 98.0°F | Ht 65.0 in | Wt 136.4 lb

## 2023-09-07 DIAGNOSIS — Z20822 Contact with and (suspected) exposure to covid-19: Secondary | ICD-10-CM

## 2023-09-07 DIAGNOSIS — R531 Weakness: Secondary | ICD-10-CM | POA: Diagnosis not present

## 2023-09-07 DIAGNOSIS — R1012 Left upper quadrant pain: Secondary | ICD-10-CM

## 2023-09-07 DIAGNOSIS — R739 Hyperglycemia, unspecified: Secondary | ICD-10-CM

## 2023-09-07 DIAGNOSIS — R42 Dizziness and giddiness: Secondary | ICD-10-CM | POA: Diagnosis not present

## 2023-09-07 DIAGNOSIS — E782 Mixed hyperlipidemia: Secondary | ICD-10-CM | POA: Diagnosis not present

## 2023-09-07 LAB — CBC WITH DIFFERENTIAL/PLATELET
Basophils Absolute: 0 K/uL (ref 0.0–0.1)
Basophils Relative: 0.7 % (ref 0.0–3.0)
Eosinophils Absolute: 0.1 K/uL (ref 0.0–0.7)
Eosinophils Relative: 1.1 % (ref 0.0–5.0)
HCT: 33.6 % — ABNORMAL LOW (ref 36.0–46.0)
Hemoglobin: 11.3 g/dL — ABNORMAL LOW (ref 12.0–15.0)
Lymphocytes Relative: 29.4 % (ref 12.0–46.0)
Lymphs Abs: 1.8 K/uL (ref 0.7–4.0)
MCHC: 33.6 g/dL (ref 30.0–36.0)
MCV: 89.2 fl (ref 78.0–100.0)
Monocytes Absolute: 0.6 K/uL (ref 0.1–1.0)
Monocytes Relative: 9.4 % (ref 3.0–12.0)
Neutro Abs: 3.7 K/uL (ref 1.4–7.7)
Neutrophils Relative %: 59.4 % (ref 43.0–77.0)
Platelets: 269 K/uL (ref 150.0–400.0)
RBC: 3.76 Mil/uL — ABNORMAL LOW (ref 3.87–5.11)
RDW: 14.4 % (ref 11.5–15.5)
WBC: 6.2 K/uL (ref 4.0–10.5)

## 2023-09-07 LAB — COMPREHENSIVE METABOLIC PANEL WITH GFR
ALT: 11 U/L (ref 0–35)
AST: 18 U/L (ref 0–37)
Albumin: 4.2 g/dL (ref 3.5–5.2)
Alkaline Phosphatase: 41 U/L (ref 39–117)
BUN: 48 mg/dL — ABNORMAL HIGH (ref 6–23)
CO2: 31 meq/L (ref 19–32)
Calcium: 9.9 mg/dL (ref 8.4–10.5)
Chloride: 89 meq/L — ABNORMAL LOW (ref 96–112)
Creatinine, Ser: 12.04 mg/dL (ref 0.40–1.20)
GFR: 2.84 mL/min — CL (ref >60.00)
Glucose, Bld: 106 mg/dL — ABNORMAL HIGH (ref 70–99)
Potassium: 4.6 meq/L (ref 3.5–5.1)
Sodium: 134 meq/L — ABNORMAL LOW (ref 135–145)
Total Bilirubin: 0.5 mg/dL (ref 0.2–1.2)
Total Protein: 7 g/dL (ref 6.0–8.3)

## 2023-09-07 LAB — AMYLASE: Amylase: 110 U/L (ref 27–131)

## 2023-09-07 LAB — POC COVID19 BINAXNOW: SARS Coronavirus 2 Ag: NEGATIVE

## 2023-09-07 LAB — LIPASE: Lipase: 100 U/L — ABNORMAL HIGH (ref 11.0–59.0)

## 2023-09-07 LAB — HEMOGLOBIN A1C: Hgb A1c MFr Bld: 5.3 % (ref 4.6–6.5)

## 2023-09-07 MED ORDER — ATORVASTATIN CALCIUM 40 MG PO TABS: ORAL_TABLET | ORAL | 1 refills | Status: AC

## 2023-09-07 NOTE — Telephone Encounter (Signed)
 Spoke with Kimberly Frost. She has been scheduled to see Tabitha today at 1100.

## 2023-09-07 NOTE — Addendum Note (Signed)
 Addended by: HOPE VEVA PARAS on: 09/07/2023 11:48 AM   Modules accepted: Orders

## 2023-09-07 NOTE — Telephone Encounter (Signed)
 FYI Only or Action Required?: Action required by provider: request for appointment. today  Patient was last seen in primary care on 01/20/2023 by Avelina Greig BRAVO, MD.  Called Nurse Triage reporting Fatigue, weakness, blah nausea, was vomiting. no appetite. Missed dialysis today.  Symptoms began several days ago.  Interventions attempted: Nothing.  Symptoms are: gradually worsening.  Triage Disposition: See HCP Within 4 Hours (Or PCP Triage)  Patient/caregiver understands and will follow disposition?: Yes               Copied from CRM #[id number]. Topic: Clinical - Red Word Triage >> Sep 07, 2023  8:20 AM [place] E wrote: [facility] that prompted transfer to Nurse Triage: Light-headedness, body weakness, no strength, no appetite, and anxiety, going on for 3 days. Reason for Disposition  [1] MODERATE weakness (e.g., interferes with work, school, normal activities) AND [2] cause unknown  (Exceptions: Weakness from acute minor illness or poor fluid intake; weakness is chronic and not worse.)  Answer Assessment - Initial Assessment Questions 1. DESCRIPTION: Describe how you are feeling.     blah 2. SEVERITY: How bad is it?  Can you stand and walk?     Yes - not for ling periods of time 3. ONSET: When did these symptoms begin? (e.g., hours, days, weeks, months)     Saturday 4. CAUSE: What do you think is causing the weakness or fatigue? (e.g., not drinking enough fluids, medical problem, trouble sleeping)     Unsure - maybe dialysis 5. NEW MEDICINES:  Have you started on any new medicines recently? (e.g., opioid pain medicines, benzodiazepines, muscle relaxants, antidepressants, antihistamines, neuroleptics, beta blockers)     no 6. OTHER SYMPTOMS: Do you have any other symptoms? (e.g., chest pain, fever, cough, SOB, vomiting, diarrhea, bleeding, other areas of pain)     Vomiting, weakness, No appetite.  Protocols used: Weakness (Generalized) and  Fatigue-A-AH

## 2023-09-07 NOTE — Assessment & Plan Note (Signed)
 Amylase lipase ordered pending result.  Could consider u/s as well to evaluate pancreas/spleen
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
## 2023-09-07 NOTE — Assessment & Plan Note (Addendum)
 Cbc cmp u/a pending results R/o infectious process, uti.  Pt also not eating much, encouraged to eat as tolerated even if small bites.  Encouraged oral intake hydration as tolerated.   Covid in office negative

## 2023-09-07 NOTE — Assessment & Plan Note (Signed)
 Pt advised to:  Rise slowly upon standing. Encouraged food intake as tolerated.  Orthostatic v/s in office Heart rate and BP reassuring in office

## 2023-09-07 NOTE — Telephone Encounter (Signed)
  CRITICAL VALUE:creatinine 12.04 and GFR 2.84   reports are in [employer] (on-site recipient of call):Mindel Friscia LPN  DATE & TIME NOTIFIED: 4:34 PM 09/07/23   MESSENGER (representative from lab):[facility]  MD NOTIFIED: Ginger Patrick, NP  TIME OF NOTIFICATION:4:34 PM  RESPONSE:

## 2023-09-08 ENCOUNTER — Emergency Department
Admission: EM | Admit: 2023-09-08 | Discharge: 2023-09-08 | Disposition: A | Discharge status: Home / Self Care | Attending: Emergency Medicine | Admitting: Emergency Medicine

## 2023-09-08 ENCOUNTER — Other Ambulatory Visit: Payer: Self-pay

## 2023-09-08 ENCOUNTER — Ambulatory Visit: Payer: Self-pay | Admitting: Family

## 2023-09-08 ENCOUNTER — Telehealth: Payer: Self-pay | Admitting: Family

## 2023-09-08 ENCOUNTER — Encounter: Payer: Self-pay | Admitting: Emergency Medicine

## 2023-09-08 ENCOUNTER — Emergency Department

## 2023-09-08 DIAGNOSIS — N186 End stage renal disease: Secondary | ICD-10-CM | POA: Diagnosis not present

## 2023-09-08 DIAGNOSIS — C3490 Malignant neoplasm of unspecified part of unspecified bronchus or lung: Secondary | ICD-10-CM | POA: Diagnosis not present

## 2023-09-08 DIAGNOSIS — Z992 Dependence on renal dialysis: Secondary | ICD-10-CM | POA: Diagnosis not present

## 2023-09-08 DIAGNOSIS — I10 Essential (primary) hypertension: Secondary | ICD-10-CM | POA: Diagnosis not present

## 2023-09-08 DIAGNOSIS — N281 Cyst of kidney, acquired: Secondary | ICD-10-CM | POA: Diagnosis not present

## 2023-09-08 DIAGNOSIS — R1012 Left upper quadrant pain: Secondary | ICD-10-CM

## 2023-09-08 DIAGNOSIS — R748 Abnormal levels of other serum enzymes: Secondary | ICD-10-CM

## 2023-09-08 DIAGNOSIS — R531 Weakness: Secondary | ICD-10-CM | POA: Diagnosis present

## 2023-09-08 DIAGNOSIS — N189 Chronic kidney disease, unspecified: Secondary | ICD-10-CM | POA: Diagnosis not present

## 2023-09-08 LAB — CBC
HCT: 30.3 % — ABNORMAL LOW (ref 36.0–46.0)
Hemoglobin: 10.5 g/dL — ABNORMAL LOW (ref 12.0–15.0)
MCH: 29.7 pg (ref 26.0–34.0)
MCHC: 34.7 g/dL (ref 30.0–36.0)
MCV: 85.8 fL (ref 80.0–100.0)
Platelets: 246 K/uL (ref 150–400)
RBC: 3.53 MIL/uL — ABNORMAL LOW (ref 3.87–5.11)
RDW: 14.2 % (ref 11.5–15.5)
WBC: 7 K/uL (ref 4.0–10.5)
nRBC: 0 % (ref 0.0–0.2)

## 2023-09-08 LAB — LIPASE, BLOOD: Lipase: 71 U/L — ABNORMAL HIGH (ref 11–51)

## 2023-09-08 LAB — COMPREHENSIVE METABOLIC PANEL WITH GFR
ALT: 13 U/L (ref 0–44)
AST: 24 U/L (ref 15–41)
Albumin: 4 g/dL (ref 3.5–5.0)
Alkaline Phosphatase: 44 U/L (ref 38–126)
Anion gap: 13 (ref 5–15)
BUN: 19 mg/dL (ref 8–23)
CO2: 28 mmol/L (ref 22–32)
Calcium: 8.7 mg/dL — ABNORMAL LOW (ref 8.9–10.3)
Chloride: 95 mmol/L — ABNORMAL LOW (ref 98–111)
Creatinine, Ser: 5.96 mg/dL — ABNORMAL HIGH (ref 0.44–1.00)
GFR, Estimated: 7 mL/min — ABNORMAL LOW (ref >60)
Glucose, Bld: 108 mg/dL — ABNORMAL HIGH (ref 70–99)
Potassium: 3.8 mmol/L (ref 3.5–5.1)
Sodium: 136 mmol/L (ref 135–145)
Total Bilirubin: 1.2 mg/dL (ref 0.0–1.2)
Total Protein: 7.5 g/dL (ref 6.5–8.1)

## 2023-09-08 MED ORDER — SODIUM CHLORIDE 0.9 % IV BOLUS
500.0000 mL | Freq: Once | INTRAVENOUS | Status: AC
  Administered 2023-09-08: 500 mL via INTRAVENOUS

## 2023-09-08 MED ORDER — IOHEXOL 300 MG/ML  SOLN
100.0000 mL | Freq: Once | INTRAMUSCULAR | Status: AC | PRN
  Administered 2023-09-08: 100 mL via INTRAVENOUS

## 2023-09-08 MED ORDER — ONDANSETRON HCL 4 MG/2ML IJ SOLN
4.0000 mg | Freq: Once | INTRAMUSCULAR | Status: AC
  Administered 2023-09-08: 4 mg via INTRAVENOUS
  Filled 2023-09-08: qty 2

## 2023-09-08 NOTE — Telephone Encounter (Signed)
 NOTED See other note

## 2023-09-08 NOTE — Telephone Encounter (Signed)
 Notified yesterday 8/5 at 434 pm  Expected results, pt on dialysis

## 2023-09-08 NOTE — ED Triage Notes (Signed)
 Patient to ED via POV for abnormal lab- lipase 100. PT reports feeling weak and tired for a few days. PT skipped dialysis yesterday due to feeling bad.

## 2023-09-08 NOTE — Telephone Encounter (Signed)
 Spoke with pt. Kimberly Frost wanted the pt to know that we need to do a CT scan of her abdomen with contrast due to her lipase being elevated. Pt is aware of this information. States that she had dialysis today and has to go back tomorrow for it again. She wants to have the CT on a Monday if she can as she doesn't have dialysis on that day.

## 2023-09-08 NOTE — Telephone Encounter (Signed)
 Copied from CRM #[id number]. Topic: General - Call Back - No Documentation >> Sep 08, 2023 10:47 AM Berneda FALCON wrote: Reason for CRM: Patient states she received a call today from who she believes was Manuelita but I do not see any documentation as to the reason for the call.   Please call patient back at [phone]

## 2023-09-08 NOTE — ED Provider Notes (Signed)
 [facility] Provider Note   Event Date/Time   First MD Initiated Contact with Patient 09/08/23 1707     (approximate) History  No chief complaint on file.  HPI Kimberly Frost is a 73 y.o. female with a past medical history of end-stage renal disease on dialysis Tuesday/Thursday/Saturday who presents after she presented to her PCP with concerns for generalized weakness and was found to have an elevated lipase to 100.  Patient states that she was told to come to the emergency department for a CT of her abdomen and pelvis with concerns for possible pancreatitis.  Patient denies any abdominal pain and only complains of mild nausea in addition to generalized weakness. ROS: Patient currently denies any vision changes, tinnitus, difficulty speaking, facial droop, sore throat, chest pain, shortness of breath, abdominal pain, vomiting/diarrhea, dysuria, or weakness/numbness/paresthesias in any extremity   Physical Exam  Triage Vital Signs: ED Triage Vitals  Encounter Vitals Group     BP 09/08/23 1528 136/71     Girls Systolic BP Percentile --      Girls Diastolic BP Percentile --      Boys Systolic BP Percentile --      Boys Diastolic BP Percentile --      Pulse Rate 09/08/23 1528 96     Resp 09/08/23 1528 18     Temp 09/08/23 1528 98.3 F (36.8 C)     Temp Source 09/08/23 1528 Oral     SpO2 09/08/23 1528 98 %     Weight 09/08/23 1530 136 lb (61.7 kg)     Height 09/08/23 1530 5' 6 (1.676 m)     Head Circumference --      Peak Flow --      Pain Score --      Pain Loc --      Pain Education --      Exclude from Growth Chart --    Most recent vital signs: Vitals:   09/08/23 2020 09/08/23 2122  BP: (!) 149/76 (!) 158/83  Pulse: 93 90  Resp: 18 18  Temp: 98.6 F (37 C)   SpO2: 99% 99%   General: Awake, oriented x4. CV:  Good peripheral perfusion. Resp:  Normal effort. Abd:  No distention.  Nontender to palpation Other:  Elderly well-developed,  well-nourished African-American female resting comfortably in no acute distress ED Results / Procedures / Treatments  Labs (all labs ordered are listed, but only abnormal results are displayed) Labs Reviewed  COMPREHENSIVE METABOLIC PANEL WITH GFR - Abnormal; Notable for the following components:      Result Value   Chloride 95 (*)    Glucose, Bld 108 (*)    Creatinine, Ser 5.96 (*)    Calcium  8.7 (*)    GFR, Estimated 7 (*)    All other components within normal limits  CBC - Abnormal; Notable for the following components:   RBC 3.53 (*)    Hemoglobin 10.5 (*)    HCT 30.3 (*)    All other components within normal limits  LIPASE, BLOOD - Abnormal; Notable for the following components:   Lipase 71 (*)    All other components within normal limits  URINALYSIS, ROUTINE W REFLEX MICROSCOPIC   EKG ED ECG REPORT I, Artist MARLA Kerns, the attending physician, personally viewed and interpreted this ECG. Date: 09/08/2023 EKG Time: 1538 Rate: 91 Rhythm: normal sinus rhythm QRS Axis: normal Intervals: normal ST/T Wave abnormalities: normal Narrative Interpretation: no evidence of acute ischemia RADIOLOGY ED  MD interpretation: CT of the abdomen pelvis with IV contrast shows no evidence of acute intra-abdominal or intrapelvic abnormalities - All radiology independently interpreted and agree with radiology assessment Official radiology report(s): CT ABDOMEN PELVIS W CONTRAST Result Date: 09/08/2023 CLINICAL DATA:  Elevated lipase, weakness, fatigue, non-small cell lung cancer EXAM: CT ABDOMEN AND PELVIS WITH CONTRAST TECHNIQUE: Multidetector CT imaging of the abdomen and pelvis was performed using the standard protocol following bolus administration of intravenous contrast. RADIATION DOSE REDUCTION: This exam was performed according to the departmental dose-optimization program which includes automated exposure control, adjustment of the mA and/or kV according to patient size and/or use of  iterative reconstruction technique. CONTRAST:  OMNIPAQUE  IOHEXOL  300 MG/ML  SOLN COMPARISON:  02/06/2022, 07/28/2023 FINDINGS: Lower chest: Stable 5 mm right lower lobe pulmonary nodule, reference image 7/2, likely benign given long-term stability. Hepatobiliary: No focal liver abnormality is seen. No gallstones, gallbladder wall thickening, or biliary dilatation. Pancreas: Unremarkable. No pancreatic ductal dilatation or surrounding inflammatory changes. Spleen: Normal in size without focal abnormality. Adrenals/Urinary Tract: Numerous simple bilateral renal cortical cysts and marked bilateral renal cortical atrophy consistent with history of chronic renal insufficiency. The adrenals are unremarkable. The bladder is decompressed, limiting its evaluation. Stomach/Bowel: No bowel obstruction or ileus. Normal appendix right lower quadrant. No bowel wall thickening or inflammatory change. Small hiatal hernia. Vascular/Lymphatic: Aortic atherosclerosis. Stable atherosclerosis at the origin of the SMA and celiac artery. No enlarged abdominal or pelvic lymph nodes. Reproductive: Multiple calcified uterine fibroids again noted. No adnexal masses. Other: No free fluid or free intraperitoneal gas. No abdominal wall hernia. Musculoskeletal: No acute or destructive bony abnormalities. Reconstructed images demonstrate no additional findings. IMPRESSION: 1. No acute intra-abdominal or intrapelvic process. No CT evidence of acute pancreatitis. 2. Stable 5 mm right lower lobe pulmonary nodule, likely benign given long-term stability. 3. Stable sequela of chronic renal insufficiency. 4. Fibroid uterus. 5.  Aortic Atherosclerosis (ICD10-I70.0). Electronically Signed   By: Ozell Daring M.D.   On: 09/08/2023 19:11   PROCEDURES: Critical Care performed: No Procedures MEDICATIONS ORDERED IN ED: Medications  ondansetron  (ZOFRAN ) injection 4 mg (4 mg Intravenous Given 09/08/23 1745)  sodium chloride  0.9 % bolus 500 mL (0 mLs  Intravenous Stopped 09/08/23 1900)  iohexol  (OMNIPAQUE ) 300 MG/ML solution 100 mL (100 mLs Intravenous Contrast Given 09/08/23 1822)   IMPRESSION / MDM / ASSESSMENT AND PLAN / ED COURSE  I reviewed the triage vital signs and the nursing notes.                             The patient is on the cardiac monitor to evaluate for evidence of arrhythmia and/or significant heart rate changes. Patient's presentation is most consistent with acute presentation with potential threat to life or bodily function. Patient is a 73 year old female who presents for outside lab value of elevated lipase to 100.  Patient's lipase is already down trended here to 71.  Patient denies any abdominal pain.  Patient's CT of the abdomen and pelvis did not show any signs of pancreatitis or biliary obstruction.  Patient's laboratory evaluation shows no evidence of biliary obstruction.  Patient's other labs are at baseline. The patient has been reexamined and is ready to be discharged.  All diagnostic results have been reviewed and discussed with the patient/family.  Care plan has been outlined and the patient/family understands all current diagnoses, results, and treatment plans.  There are no new complaints, changes, or physical  findings at this time.  All questions have been addressed and answered.  Patient was instructed to, and agrees to follow-up with their primary care physician as well as return to the emergency department if any new or worsening symptoms develop.   FINAL CLINICAL IMPRESSION(S) / ED DIAGNOSES   Final diagnoses:  Elevated lipase   Rx / DC Orders   ED Discharge Orders     None      Note:  This document was prepared using Dragon voice recognition software and may include unintentional dictation errors.   Beuford Garcilazo K, MD 09/08/23 [phone]

## 2023-09-09 ENCOUNTER — Ambulatory Visit

## 2023-09-09 DIAGNOSIS — Z992 Dependence on renal dialysis: Secondary | ICD-10-CM | POA: Diagnosis not present

## 2023-09-14 DIAGNOSIS — N186 End stage renal disease: Secondary | ICD-10-CM | POA: Diagnosis not present

## 2023-09-14 DIAGNOSIS — N2581 Secondary hyperparathyroidism of renal origin: Secondary | ICD-10-CM | POA: Diagnosis not present

## 2023-09-14 DIAGNOSIS — D631 Anemia in chronic kidney disease: Secondary | ICD-10-CM | POA: Diagnosis not present

## 2023-09-14 DIAGNOSIS — Z992 Dependence on renal dialysis: Secondary | ICD-10-CM | POA: Diagnosis not present

## 2023-09-16 DIAGNOSIS — Z992 Dependence on renal dialysis: Secondary | ICD-10-CM | POA: Diagnosis not present

## 2023-09-16 DIAGNOSIS — D631 Anemia in chronic kidney disease: Secondary | ICD-10-CM | POA: Diagnosis not present

## 2023-09-16 DIAGNOSIS — N2581 Secondary hyperparathyroidism of renal origin: Secondary | ICD-10-CM | POA: Diagnosis not present

## 2023-09-16 DIAGNOSIS — D689 Coagulation defect, unspecified: Secondary | ICD-10-CM | POA: Diagnosis not present

## 2023-09-21 DIAGNOSIS — Z992 Dependence on renal dialysis: Secondary | ICD-10-CM | POA: Diagnosis not present

## 2023-09-28 DIAGNOSIS — D631 Anemia in chronic kidney disease: Secondary | ICD-10-CM | POA: Diagnosis not present

## 2023-09-28 DIAGNOSIS — Z992 Dependence on renal dialysis: Secondary | ICD-10-CM | POA: Diagnosis not present

## 2023-09-28 DIAGNOSIS — N186 End stage renal disease: Secondary | ICD-10-CM | POA: Diagnosis not present

## 2023-09-28 DIAGNOSIS — N2581 Secondary hyperparathyroidism of renal origin: Secondary | ICD-10-CM | POA: Diagnosis not present

## 2023-09-30 DIAGNOSIS — N186 End stage renal disease: Secondary | ICD-10-CM | POA: Diagnosis not present

## 2023-09-30 DIAGNOSIS — D689 Coagulation defect, unspecified: Secondary | ICD-10-CM | POA: Diagnosis not present

## 2023-09-30 DIAGNOSIS — N2581 Secondary hyperparathyroidism of renal origin: Secondary | ICD-10-CM | POA: Diagnosis not present

## 2023-09-30 DIAGNOSIS — D631 Anemia in chronic kidney disease: Secondary | ICD-10-CM | POA: Diagnosis not present

## 2023-09-30 DIAGNOSIS — Z992 Dependence on renal dialysis: Secondary | ICD-10-CM | POA: Diagnosis not present

## 2023-10-03 DIAGNOSIS — N186 End stage renal disease: Secondary | ICD-10-CM | POA: Diagnosis not present

## 2023-10-03 DIAGNOSIS — Z992 Dependence on renal dialysis: Secondary | ICD-10-CM | POA: Diagnosis not present

## 2023-10-03 DIAGNOSIS — I129 Hypertensive chronic kidney disease with stage 1 through stage 4 chronic kidney disease, or unspecified chronic kidney disease: Secondary | ICD-10-CM | POA: Diagnosis not present

## 2023-10-05 DIAGNOSIS — D631 Anemia in chronic kidney disease: Secondary | ICD-10-CM | POA: Diagnosis not present

## 2023-10-05 DIAGNOSIS — N2581 Secondary hyperparathyroidism of renal origin: Secondary | ICD-10-CM | POA: Diagnosis not present

## 2023-10-05 DIAGNOSIS — D509 Iron deficiency anemia, unspecified: Secondary | ICD-10-CM | POA: Diagnosis not present

## 2023-10-05 DIAGNOSIS — D689 Coagulation defect, unspecified: Secondary | ICD-10-CM | POA: Diagnosis not present

## 2023-10-05 DIAGNOSIS — Z992 Dependence on renal dialysis: Secondary | ICD-10-CM | POA: Diagnosis not present

## 2023-10-07 DIAGNOSIS — N186 End stage renal disease: Secondary | ICD-10-CM | POA: Diagnosis not present

## 2023-10-07 DIAGNOSIS — D689 Coagulation defect, unspecified: Secondary | ICD-10-CM | POA: Diagnosis not present

## 2023-10-07 DIAGNOSIS — D509 Iron deficiency anemia, unspecified: Secondary | ICD-10-CM | POA: Diagnosis not present

## 2023-10-07 DIAGNOSIS — Z992 Dependence on renal dialysis: Secondary | ICD-10-CM | POA: Diagnosis not present

## 2023-10-07 DIAGNOSIS — N2581 Secondary hyperparathyroidism of renal origin: Secondary | ICD-10-CM | POA: Diagnosis not present

## 2023-10-07 DIAGNOSIS — D631 Anemia in chronic kidney disease: Secondary | ICD-10-CM | POA: Diagnosis not present

## 2023-10-14 DIAGNOSIS — D689 Coagulation defect, unspecified: Secondary | ICD-10-CM | POA: Diagnosis not present

## 2023-10-14 DIAGNOSIS — D509 Iron deficiency anemia, unspecified: Secondary | ICD-10-CM | POA: Diagnosis not present

## 2023-10-14 DIAGNOSIS — D631 Anemia in chronic kidney disease: Secondary | ICD-10-CM | POA: Diagnosis not present

## 2023-10-14 DIAGNOSIS — N2581 Secondary hyperparathyroidism of renal origin: Secondary | ICD-10-CM | POA: Diagnosis not present

## 2023-10-14 DIAGNOSIS — Z992 Dependence on renal dialysis: Secondary | ICD-10-CM | POA: Diagnosis not present

## 2023-10-16 DIAGNOSIS — D631 Anemia in chronic kidney disease: Secondary | ICD-10-CM | POA: Diagnosis not present

## 2023-10-16 DIAGNOSIS — N186 End stage renal disease: Secondary | ICD-10-CM | POA: Diagnosis not present

## 2023-10-16 DIAGNOSIS — D509 Iron deficiency anemia, unspecified: Secondary | ICD-10-CM | POA: Diagnosis not present

## 2023-10-16 DIAGNOSIS — D689 Coagulation defect, unspecified: Secondary | ICD-10-CM | POA: Diagnosis not present

## 2023-10-16 DIAGNOSIS — Z992 Dependence on renal dialysis: Secondary | ICD-10-CM | POA: Diagnosis not present

## 2023-10-16 DIAGNOSIS — N2581 Secondary hyperparathyroidism of renal origin: Secondary | ICD-10-CM | POA: Diagnosis not present

## 2023-10-21 DIAGNOSIS — D689 Coagulation defect, unspecified: Secondary | ICD-10-CM | POA: Diagnosis not present

## 2023-10-21 DIAGNOSIS — N186 End stage renal disease: Secondary | ICD-10-CM | POA: Diagnosis not present

## 2023-10-21 DIAGNOSIS — Z992 Dependence on renal dialysis: Secondary | ICD-10-CM | POA: Diagnosis not present

## 2023-10-21 DIAGNOSIS — D631 Anemia in chronic kidney disease: Secondary | ICD-10-CM | POA: Diagnosis not present

## 2023-10-21 DIAGNOSIS — N2581 Secondary hyperparathyroidism of renal origin: Secondary | ICD-10-CM | POA: Diagnosis not present

## 2023-10-21 DIAGNOSIS — D509 Iron deficiency anemia, unspecified: Secondary | ICD-10-CM | POA: Diagnosis not present

## 2023-10-23 DIAGNOSIS — N186 End stage renal disease: Secondary | ICD-10-CM | POA: Diagnosis not present

## 2023-10-26 DIAGNOSIS — N186 End stage renal disease: Secondary | ICD-10-CM | POA: Diagnosis not present

## 2023-10-30 DIAGNOSIS — N2581 Secondary hyperparathyroidism of renal origin: Secondary | ICD-10-CM | POA: Diagnosis not present

## 2023-10-30 DIAGNOSIS — Z992 Dependence on renal dialysis: Secondary | ICD-10-CM | POA: Diagnosis not present

## 2023-10-30 DIAGNOSIS — D631 Anemia in chronic kidney disease: Secondary | ICD-10-CM | POA: Diagnosis not present

## 2023-10-30 DIAGNOSIS — N186 End stage renal disease: Secondary | ICD-10-CM | POA: Diagnosis not present

## 2023-10-30 DIAGNOSIS — D689 Coagulation defect, unspecified: Secondary | ICD-10-CM | POA: Diagnosis not present

## 2023-10-30 DIAGNOSIS — D509 Iron deficiency anemia, unspecified: Secondary | ICD-10-CM | POA: Diagnosis not present

## 2023-11-01 ENCOUNTER — Ambulatory Visit
Admission: RE | Admit: 2023-11-01 | Discharge: 2023-11-01 | Disposition: A | Discharge status: Home / Self Care | Source: Ambulatory Visit | Attending: Radiology | Admitting: Radiology

## 2023-11-01 ENCOUNTER — Encounter (HOSPITAL_COMMUNITY): Payer: Self-pay

## 2023-11-01 DIAGNOSIS — C3491 Malignant neoplasm of unspecified part of right bronchus or lung: Secondary | ICD-10-CM

## 2023-11-01 DIAGNOSIS — C3490 Malignant neoplasm of unspecified part of unspecified bronchus or lung: Secondary | ICD-10-CM | POA: Diagnosis not present

## 2023-11-02 DIAGNOSIS — N2581 Secondary hyperparathyroidism of renal origin: Secondary | ICD-10-CM | POA: Diagnosis not present

## 2023-11-02 DIAGNOSIS — D631 Anemia in chronic kidney disease: Secondary | ICD-10-CM | POA: Diagnosis not present

## 2023-11-02 DIAGNOSIS — N186 End stage renal disease: Secondary | ICD-10-CM | POA: Diagnosis not present

## 2023-11-02 DIAGNOSIS — Z992 Dependence on renal dialysis: Secondary | ICD-10-CM | POA: Diagnosis not present

## 2023-11-02 DIAGNOSIS — D689 Coagulation defect, unspecified: Secondary | ICD-10-CM | POA: Diagnosis not present

## 2023-11-02 DIAGNOSIS — D509 Iron deficiency anemia, unspecified: Secondary | ICD-10-CM | POA: Diagnosis not present

## 2023-11-02 DIAGNOSIS — I129 Hypertensive chronic kidney disease with stage 1 through stage 4 chronic kidney disease, or unspecified chronic kidney disease: Secondary | ICD-10-CM | POA: Diagnosis not present

## 2023-11-08 ENCOUNTER — Encounter: Payer: Self-pay | Admitting: Radiation Oncology

## 2023-11-08 ENCOUNTER — Ambulatory Visit
Admission: RE | Admit: 2023-11-08 | Discharge: 2023-11-08 | Disposition: A | Discharge status: Home / Self Care | Payer: Self-pay | Source: Ambulatory Visit | Attending: Radiation Oncology | Admitting: Radiation Oncology

## 2023-11-08 VITALS — BP 152/73 | HR 93 | Temp 97.7°F | Resp 20 | Ht 66.0 in | Wt 135.0 lb

## 2023-11-08 DIAGNOSIS — I700 Atherosclerosis of aorta: Secondary | ICD-10-CM | POA: Diagnosis not present

## 2023-11-08 DIAGNOSIS — R911 Solitary pulmonary nodule: Secondary | ICD-10-CM | POA: Diagnosis not present

## 2023-11-08 DIAGNOSIS — C3491 Malignant neoplasm of unspecified part of right bronchus or lung: Secondary | ICD-10-CM | POA: Insufficient documentation

## 2023-11-08 DIAGNOSIS — R918 Other nonspecific abnormal finding of lung field: Secondary | ICD-10-CM | POA: Insufficient documentation

## 2023-11-08 DIAGNOSIS — I2510 Atherosclerotic heart disease of native coronary artery without angina pectoris: Secondary | ICD-10-CM | POA: Insufficient documentation

## 2023-11-08 DIAGNOSIS — Z923 Personal history of irradiation: Secondary | ICD-10-CM | POA: Diagnosis not present

## 2023-11-08 DIAGNOSIS — K449 Diaphragmatic hernia without obstruction or gangrene: Secondary | ICD-10-CM | POA: Diagnosis not present

## 2023-11-08 DIAGNOSIS — I7121 Aneurysm of the ascending aorta, without rupture: Secondary | ICD-10-CM | POA: Diagnosis not present

## 2023-11-08 DIAGNOSIS — Z79899 Other long term (current) drug therapy: Secondary | ICD-10-CM | POA: Diagnosis not present

## 2023-11-08 NOTE — Progress Notes (Signed)
 Radiation Oncology         [phone] ________________________________  Name: Kimberly Frost MRN: [id number]  Date: 11/08/2023  DOB: [birth year]  Follow-Up Visit Note  CC: Corwin Antu, FNP  Winnifred Fonda BROCKS, MD    ICD-10-CM   1. NSCLC of right lung (HCC)  C34.91 CT CHEST WO CONTRAST    2. Aneurysm of ascending aorta without rupture  I71.21 Ambulatory referral to Vascular Surgery      Diagnosis: Stage IB (cT2, N0, M0) NSCLC of the RUL; s/p SBRT completed on 08/25/2021   Interval Since Last Radiation: 2 years, 2 months, and 12 days   Intent: Curative  Radiation Treatment Dates: 08/12/2021 through 08/25/2021 Site Technique Total Dose (Gy) Dose per Fx (Gy) Completed Fx Beam Energies  Lung, Right: Lung_R IMRT 50/50 5 10/10 6XFFF    Narrative:  The patient returns today for routine follow-up and to review recent imaging. She was last seen here for follow-up on 08/02/23.         To review from her last visit, her recent PET scan at that time on (performed on 07/28/23) showed a right upper lobe opacity with some low-level uptake. She opted to proceed with interval repeat imaging to reassess this finding.           Subsequent interval imaging consisted of a chest CT on 11/01/23 which demonstrated: stable findings in the right apical and suprahilar right lung compatible with scarring from radiation therapy, and stability of the 6 mm right lower lobe pulmonary nodule. No new or progressive findings to suggest recurrent or metastatic disease were demonstrated overall. Other findings of potential clinical significance included a 4.1 cm ascending thoracic aortic aneurysm (for which annual follow-up imaging is advised).      She was also seen in the ED on 09/08/23 after presenting to her PCP the day prior with generalized weakness. Labs collected at that time were notable for elevated lipase of up to 100. Her PCP accordingly advised her to present to the ED for work-up to rule out  pancreatitis. A CT AP was accordingly performed in the ED which showed no acute intra-abdominal or intrapelvic processes. The likely benign 5 mm RLL nodule was also demonstrated and appeared stable.   The patient reports to be doing well overall today.  She denies any worsening shortness of breath, cough, chest pain, or any other changes to her breathing since we last saw her.  She notes variable energy levels, and attributes this to her dialysis.  Allergies:  is allergic to sertraline .  Meds: Current Outpatient Medications  Medication Sig Dispense Refill   acetaminophen  (TYLENOL ) 500 MG tablet Take 1,000 mg by mouth every 6 (six) hours as needed for moderate pain or headache.     atorvastatin  (LIPITOR) 40 MG tablet Take two tablets once nightly 180 tablet 1   busPIRone  (BUSPAR ) 5 MG tablet TAKE 1/2 TABLET BY MOUTH TWICE DAILY (Patient not taking: Reported on 11/08/2023) 90 tablet 1   cyclobenzaprine (FLEXERIL) 5 MG tablet Take 5 mg by mouth 2 (two) times daily as needed.     gabapentin  (NEURONTIN ) 100 MG capsule Take 100-200 mg by mouth at bedtime.     midodrine (PROAMATINE) 10 MG tablet Take 5 mg by mouth See admin instructions. Take 1 tablet (5mg ) by mouth 30 minutes before dialysis and take 1 tablet (5mg ) by mouth during dialysis if needed for BP support     multivitamin (RENA-VIT) TABS tablet Take 1 tablet by mouth daily. (Patient  not taking: Reported on 11/08/2023)     sucroferric oxyhydroxide (VELPHORO ) 500 MG chewable tablet Chew 500-1,000 mg by mouth See admin instructions. Take 2 tablets (1000mg ) by mouth three times daily with meals and take 1 tablet (500mg ) by mouth with snacks     No current facility-administered medications for this encounter.    Physical Findings: The patient is in no acute distress. Patient is alert and oriented.  height is 5' 6 (1.676 m) and weight is 135 lb (61.2 kg). Her temperature is 97.7 F (36.5 C). Her blood pressure is 152/73 (abnormal) and her pulse  is 93. Her respiration is 20 and oxygen saturation is 100%. .  No significant changes. Lungs are clear to auscultation bilaterally. Heart has regular rate and rhythm. No palpable cervical, supraclavicular, or axillary adenopathy. Abdomen soft, non-tender, normal bowel sounds.   Lab Findings: Lab Results  Component Value Date   WBC 7.0 09/08/2023   HGB 10.5 (L) 09/08/2023   HCT 30.3 (L) 09/08/2023   MCV 85.8 09/08/2023   PLT 246 09/08/2023    Radiographic Findings: CT CHEST WO CONTRAST Result Date: 11/05/2023 CLINICAL DATA:  Non-small-cell lung cancer. Restaging. * Tracking Code: BO * EXAM: CT CHEST WITHOUT CONTRAST TECHNIQUE: Multidetector CT imaging of the chest was performed following the standard protocol without IV contrast. RADIATION DOSE REDUCTION: This exam was performed according to the departmental dose-optimization program which includes automated exposure control, adjustment of the mA and/or kV according to patient size and/or use of iterative reconstruction technique. COMPARISON:  PET-CT 07/28/2023.  Chest CT 07/07/2023. FINDINGS: Cardiovascular: The heart size is normal. No substantial pericardial effusion. Coronary artery calcification is evident. Moderate atherosclerotic calcification is noted in the wall of the thoracic aorta. Ascending thoracic aorta measures up to 4.1 cm diameter. Mediastinum/Nodes: No mediastinal lymphadenopathy. No evidence for gross hilar lymphadenopathy although assessment is limited by the lack of intravenous contrast on the current study. The esophagus has normal imaging features. Tiny hiatal hernia evident. There is no axillary lymphadenopathy. Lungs/Pleura: Centrilobular emphsyema noted. Volume loss, consolidative opacity, and architectural distortion involving the right apex and extending down into the suprahilar right lung is similar to prior compatible with scarring from radiation therapy. 6 mm right lower lobe pulmonary nodule on 107/3 is stable in the  interval. Chronic atelectasis or scarring in the inferior lingula and anterior left lower lobe is similar to prior. Nodular ground-glass opacity on 98/3 in the lingula is stable. No pulmonary edema. No pleural effusion. Upper Abdomen: Visualized portion of the upper abdomen shows no acute findings. Musculoskeletal: No worrisome lytic or sclerotic osseous abnormality. IMPRESSION: 1. Stable exam. No new or progressive findings to suggest recurrent or metastatic disease. 2. Stable appearance of the right apical and suprahilar right lung compatible with scarring from radiation therapy. 3. Stable 6 mm right lower lobe pulmonary nodule. 4. 4.1 cm diameter ascending thoracic aortic aneurysm. Recommend annual imaging followup by CTA or MRA. This recommendation follows 2010 ACCF/AHA/AATS/ACR/ASA/SCA/SCAI/SIR/STS/SVM Guidelines for the Diagnosis and Management of Patients with Thoracic Aortic Disease. Circulation. 2010; 121: [id number]. Aortic aneurysm NOS (ICD10-I71.9) 5.  Aortic Atherosclerosis (ICD10-I70.0). Electronically Signed   By: Camellia Candle M.D.   On: 11/05/2023 06:05    Impression/Plans:    Stage IB (cT2, N0, M0) NSCLC of the RUL; s/p SBRT completed on 08/25/2021   This patient is doing well overall today. We have personally reviewed her most recent imaging. CT from 11/01/2023 demonstrates stable appearance of the right apical and suprahilar right lung, with no  new or progressive findings to suggest recurrent or metastatic disease.   Repeat imaging in 6 months with an office visit to follow. Patient was encouraged to call with any questions or concerns in the meantime.    Ascending thoracic aortic aneurysm   Ascending aortic aneurysm noted on imaging Referral to vascular surgery placed today for further management.    20 minutes of total time was spent for this patient encounter, including preparation, face-to-face counseling with the patient and coordination of care, physical exam, and  documentation of the encounter. ____________________________________   Leeroy Due, PA-C  This document serves as a record of services personally performed by Leeroy Due, PA-C. It was created on her behalf by Dorthy Fuse, a trained medical scribe. The creation of this record is based on the scribe's personal observations and the provider's statements to them. This document has been checked and approved by the attending provider.

## 2023-11-08 NOTE — Progress Notes (Signed)
 Kimberly DICKERSON is here today for follow up post radiation to the lung.  Lung Side: Right, patient completed treatment on 08/25/21.   Does the patient complain of any of the following: Pain: No  Shortness of breath w/wo exertion:No  Cough: No Hemoptysis: No Pain with swallowing: No Swallowing/choking concerns: No Appetite: good  Energy Level: up and down due to to dialysis.   Post radiation skin Changes: N\o     Additional comments if applicable:   BP (!) 152/73 (BP Location: Right Arm, Patient Position: Sitting, Cuff Size: Small)   Pulse 93   Temp 97.7 F (36.5 C)   Resp 20   Ht 5' 6 (1.676 m)   Wt 135 lb (61.2 kg)   SpO2 100%   BMI 21.79 kg/m

## 2023-11-09 ENCOUNTER — Telehealth: Payer: Self-pay | Admitting: Radiation Oncology

## 2023-11-09 NOTE — Telephone Encounter (Signed)
 10/7 Follow up call to [facility] spoke to [facility] referral received will call back with date/time once pt is sch.

## 2023-11-30 ENCOUNTER — Telehealth: Payer: Self-pay | Admitting: Radiation Oncology

## 2023-11-30 NOTE — Telephone Encounter (Signed)
 10/28 Follow up call on outgoing referral to Vascular Surgery/Clinic-[facility] ([place]), spoke to [place] still no responses from patient after 2 attempts to sch.  Will call again today, if still no responses will close out referral.  Will call back for update, if patient is sch or not.

## 2023-12-13 ENCOUNTER — Telehealth: Payer: Self-pay | Admitting: Radiation Oncology

## 2023-12-13 NOTE — Telephone Encounter (Signed)
 11/10 Outgoing referral has been closed at this time, no response from patient after 4 attempts to sch -per Harlene MOTE - Vascular Surgery/Clinic -[facility] ( [place])/

## 2024-01-18 ENCOUNTER — Encounter (HOSPITAL_COMMUNITY): Payer: Self-pay

## 2024-03-02 ENCOUNTER — Other Ambulatory Visit: Payer: Self-pay | Admitting: Family

## 2024-03-02 DIAGNOSIS — E782 Mixed hyperlipidemia: Secondary | ICD-10-CM

## 2024-04-21 ENCOUNTER — Ambulatory Visit

## 2024-05-15 ENCOUNTER — Ambulatory Visit: Payer: Self-pay | Admitting: Radiation Oncology
